# Patient Record
Sex: Female | Born: 1940 | Race: White | Hispanic: No | Smoking: Current every day smoker
Health system: Southern US, Community
[De-identification: ages and names within clinical notes are randomized; demographics above are authoritative.]

## PROBLEM LIST (undated history)

## (undated) DIAGNOSIS — G8929 Other chronic pain: Secondary | ICD-10-CM

## (undated) DIAGNOSIS — I519 Heart disease, unspecified: Secondary | ICD-10-CM

## (undated) DIAGNOSIS — E785 Hyperlipidemia, unspecified: Secondary | ICD-10-CM

## (undated) DIAGNOSIS — F21 Schizotypal disorder: Secondary | ICD-10-CM

## (undated) DIAGNOSIS — Z9119 Patient's noncompliance with other medical treatment and regimen: Secondary | ICD-10-CM

## (undated) DIAGNOSIS — F329 Major depressive disorder, single episode, unspecified: Secondary | ICD-10-CM

## (undated) DIAGNOSIS — F0390 Unspecified dementia without behavioral disturbance: Secondary | ICD-10-CM

## (undated) DIAGNOSIS — I219 Acute myocardial infarction, unspecified: Secondary | ICD-10-CM

## (undated) DIAGNOSIS — E079 Disorder of thyroid, unspecified: Secondary | ICD-10-CM

## (undated) DIAGNOSIS — I509 Heart failure, unspecified: Secondary | ICD-10-CM

## (undated) DIAGNOSIS — F419 Anxiety disorder, unspecified: Secondary | ICD-10-CM

## (undated) DIAGNOSIS — I2510 Atherosclerotic heart disease of native coronary artery without angina pectoris: Secondary | ICD-10-CM

## (undated) DIAGNOSIS — I10 Essential (primary) hypertension: Secondary | ICD-10-CM

## (undated) DIAGNOSIS — I739 Peripheral vascular disease, unspecified: Secondary | ICD-10-CM

## (undated) DIAGNOSIS — J449 Chronic obstructive pulmonary disease, unspecified: Secondary | ICD-10-CM

## (undated) DIAGNOSIS — Z720 Tobacco use: Secondary | ICD-10-CM

## (undated) DIAGNOSIS — Z91199 Patient's noncompliance with other medical treatment and regimen due to unspecified reason: Secondary | ICD-10-CM

## (undated) HISTORY — PX: CHOLECYSTECTOMY: SHX55

## (undated) HISTORY — PX: THYROIDECTOMY: SHX17

---

## 2001-05-16 ENCOUNTER — Encounter (HOSPITAL_COMMUNITY): Admission: RE | Admit: 2001-05-16 | Discharge: 2001-06-15 | Payer: Self-pay | Admitting: Internal Medicine

## 2001-11-02 HISTORY — PX: CORONARY ANGIOPLASTY WITH STENT PLACEMENT: SHX49

## 2002-03-26 ENCOUNTER — Encounter: Payer: Self-pay | Admitting: Emergency Medicine

## 2002-03-26 ENCOUNTER — Inpatient Hospital Stay (HOSPITAL_COMMUNITY): Admission: AD | Admit: 2002-03-26 | Discharge: 2002-04-01 | Payer: Self-pay | Admitting: *Deleted

## 2002-05-23 ENCOUNTER — Encounter (HOSPITAL_COMMUNITY): Admission: RE | Admit: 2002-05-23 | Discharge: 2002-06-02 | Payer: Self-pay | Admitting: *Deleted

## 2005-07-21 ENCOUNTER — Observation Stay (HOSPITAL_COMMUNITY): Admission: EM | Admit: 2005-07-21 | Discharge: 2005-07-22 | Payer: Self-pay | Admitting: Emergency Medicine

## 2005-07-28 ENCOUNTER — Inpatient Hospital Stay (HOSPITAL_COMMUNITY): Admission: EM | Admit: 2005-07-28 | Discharge: 2005-08-06 | Payer: Self-pay | Admitting: Emergency Medicine

## 2005-08-11 ENCOUNTER — Inpatient Hospital Stay (HOSPITAL_COMMUNITY): Admission: AD | Admit: 2005-08-11 | Discharge: 2005-08-13 | Payer: Self-pay | Admitting: Family Medicine

## 2005-09-29 ENCOUNTER — Encounter (HOSPITAL_COMMUNITY): Admission: RE | Admit: 2005-09-29 | Discharge: 2005-10-29 | Payer: Self-pay | Admitting: *Deleted

## 2013-06-16 ENCOUNTER — Other Ambulatory Visit: Payer: Self-pay | Admitting: *Deleted

## 2013-06-16 MED ORDER — METOPROLOL TARTRATE 25 MG PO TABS: 25.0000 mg | ORAL_TABLET | Freq: Two times a day (BID) | ORAL | Status: DC

## 2013-08-30 ENCOUNTER — Ambulatory Visit (INDEPENDENT_AMBULATORY_CARE_PROVIDER_SITE_OTHER): Payer: Medicare Other | Admitting: Cardiovascular Disease

## 2013-08-30 ENCOUNTER — Encounter: Payer: Self-pay | Admitting: Cardiovascular Disease

## 2013-08-30 VITALS — BP 127/79 | HR 66 | Ht 66.0 in | Wt 107.0 lb

## 2013-08-30 DIAGNOSIS — I2510 Atherosclerotic heart disease of native coronary artery without angina pectoris: Secondary | ICD-10-CM | POA: Insufficient documentation

## 2013-08-30 DIAGNOSIS — E785 Hyperlipidemia, unspecified: Secondary | ICD-10-CM

## 2013-08-30 DIAGNOSIS — F17200 Nicotine dependence, unspecified, uncomplicated: Secondary | ICD-10-CM

## 2013-08-30 DIAGNOSIS — Z720 Tobacco use: Secondary | ICD-10-CM

## 2013-08-30 DIAGNOSIS — I10 Essential (primary) hypertension: Secondary | ICD-10-CM

## 2013-08-30 MED ORDER — SPIRONOLACTONE 25 MG PO TABS: 12.5000 mg | ORAL_TABLET | Freq: Every day | ORAL | Status: DC

## 2013-08-30 MED ORDER — SIMVASTATIN 80 MG PO TABS: 80.0000 mg | ORAL_TABLET | Freq: Every day | ORAL | Status: DC

## 2013-08-30 MED ORDER — METOPROLOL TARTRATE 25 MG PO TABS: 25.0000 mg | ORAL_TABLET | Freq: Two times a day (BID) | ORAL | Status: DC

## 2013-08-30 NOTE — Patient Instructions (Addendum)
Please follow up with Dr.Koneswaran in one year. You will receive a reminder letter in the mail in about 10 months reminding you to call and schedule your appointment. If you don't receive this letter, please contact our office.  Your physician recommends that you continue on your current medications as directed. Please refer to the Current Medication list given to you today.

## 2013-08-30 NOTE — Progress Notes (Signed)
Patient ID: Ruth Davidson, female   DOB: 02-03-1941, 72 y.o.   MRN: [id number]       CARDIOLOGY CONSULT NOTE  Patient ID: Ruth Davidson MRN: [id number] DOB/AGE: 03-Sep-1941 72 y.o.  Admit date: (Not on file) Primary Physician Kirk Ruths, MD  Reason for Consultation: CAD with stent  HPI: Ruth Davidson is a very pleasant 72 yr old woman who is a former pt of [facility]. She has known CAD with an inferior wall MI in 2003 for which she received a bare metal stent, and then had an anterior wall MI due to distal LAD disease which was treated medically due to the diffuse nature of her disease. She has not experienced any chest pain since 2006.  The patient denies any symptoms of chest pain, palpitations, shortness of breath, lightheadedness, dizziness, leg swelling, orthopnea, PND, and syncope.  At one point she had severely depressed LV systolic function, but her most recent evaluation reportedly revealed an EF of 50-55%.  SocHx: smokes 3 cigarettes daily. 3 children. Married.     No Known Allergies  Current Outpatient Prescriptions  Medication Sig Dispense Refill  . aspirin EC 81 MG tablet Take 81 mg by mouth daily.      . fish oil-omega-3 fatty acids 1000 MG capsule Take 1 g by mouth daily.      . metoprolol tartrate (LOPRESSOR) 25 MG tablet Take 1 tablet (25 mg total) by mouth 2 (two) times daily.  60 tablet  1  . simvastatin (ZOCOR) 80 MG tablet Take 80 mg by mouth at bedtime.      Marland Kitchen spironolactone (ALDACTONE) 25 MG tablet Take 12.5 mg by mouth daily.       No current facility-administered medications for this visit.    No past medical history on file.  No past surgical history on file.  History   Social History  . Marital Status: Married    Spouse Name: N/A    Number of Children: N/A  . Years of Education: N/A   Occupational History  . Not on file.   Social History Main Topics  . Smoking status: Current Every Day Smoker -- 0.25 packs/day for 50 years    Types:  Cigarettes  . Smokeless tobacco: Never Used  . Alcohol Use: Not on file  . Drug Use: Not on file  . Sexual Activity: Not on file   Other Topics Concern  . Not on file   Social History Narrative  . No narrative on file     No family history on file.   Prior to Admission medications   Medication Sig Start Date End Date Taking? Authorizing Provider  aspirin EC 81 MG tablet Take 81 mg by mouth daily.   Yes Historical Provider, MD  fish oil-omega-3 fatty acids 1000 MG capsule Take 1 g by mouth daily.   Yes Historical Provider, MD  metoprolol tartrate (LOPRESSOR) 25 MG tablet Take 1 tablet (25 mg total) by mouth 2 (two) times daily. 06/16/13  Yes Mihai Croitoru, MD  simvastatin (ZOCOR) 80 MG tablet Take 80 mg by mouth at bedtime.   Yes Historical Provider, MD  spironolactone (ALDACTONE) 25 MG tablet Take 12.5 mg by mouth daily.   Yes Historical Provider, MD     Review of systems complete and found to be negative unless listed above in HPI     Physical exam Blood pressure 127/79, pulse 66, height 5\' 6"  (1.676 m), weight 107 lb (48.535 kg). General: NAD Neck: No JVD, no thyromegaly  or thyroid nodule.  Lungs: Clear to auscultation bilaterally with normal respiratory effort. CV: Nondisplaced PMI.  Heart regular S1/S2, no S3/S4, no murmur.  No peripheral edema.  No carotid bruit.  Normal pedal pulses.  Abdomen: Soft, nontender, no hepatosplenomegaly, no distention.  Skin: Intact without lesions or rashes.  Neurologic: Alert and oriented x 3.  Psych: Normal affect. Extremities: No clubbing or cyanosis.  HEENT: Normal.   Labs:   No results found for this basename: WBC, HGB, HCT, MCV, PLT   No results found for this basename: NA, K, CL, CO2, BUN, CREATININE, CALCIUM, LABALBU, PROT, BILITOT, ALKPHOS, ALT, AST, GLUCOSE,  in the last 168 hours No results found for this basename: CKTOTAL, CKMB, CKMBINDEX, TROPONINI    No results found for this basename: CHOL   No results found for  this basename: HDL   No results found for this basename: LDLCALC   No results found for this basename: TRIG   No results found for this basename: CHOLHDL   No results found for this basename: LDLDIRECT       EKG: Sinus rhythm, rate 64 bpm, nonspecific T wave abnormality  Studies: See HPI  ASSESSMENT AND PLAN: 1. CAD: symptomatically stable for several years. Continue current therapy which includes ASA 81 mg daily, metoprolol, and simvastatin. 2. HTN: currently on spironolactone, and controlled. 3. Hyperlipidemia: on simvastatin 80 mg daily.  Dispo: f/u 1 year.  Signed: Prentice Docker, M.D., F.A.C.C.  08/30/2013, 1:46 PM

## 2013-09-01 ENCOUNTER — Encounter: Payer: Self-pay | Admitting: Cardiovascular Disease

## 2013-10-05 ENCOUNTER — Ambulatory Visit (INDEPENDENT_AMBULATORY_CARE_PROVIDER_SITE_OTHER): Payer: Medicare Other

## 2013-10-05 VITALS — BP 140/75 | HR 62 | Temp 97.9°F | Resp 18

## 2013-10-05 DIAGNOSIS — Z23 Encounter for immunization: Secondary | ICD-10-CM

## 2014-09-03 ENCOUNTER — Encounter: Payer: Self-pay | Admitting: Cardiovascular Disease

## 2014-09-03 ENCOUNTER — Ambulatory Visit (INDEPENDENT_AMBULATORY_CARE_PROVIDER_SITE_OTHER): Payer: Medicare Other | Admitting: Cardiovascular Disease

## 2014-09-03 VITALS — BP 146/90 | HR 52 | Ht 66.0 in | Wt 88.0 lb

## 2014-09-03 DIAGNOSIS — I10 Essential (primary) hypertension: Secondary | ICD-10-CM

## 2014-09-03 DIAGNOSIS — I2510 Atherosclerotic heart disease of native coronary artery without angina pectoris: Secondary | ICD-10-CM

## 2014-09-03 DIAGNOSIS — E785 Hyperlipidemia, unspecified: Secondary | ICD-10-CM

## 2014-09-03 DIAGNOSIS — Z716 Tobacco abuse counseling: Secondary | ICD-10-CM

## 2014-09-03 NOTE — Addendum Note (Signed)
Addended by: Marlyn CorporalARLTON, CATHERINE A on: 09/03/2014 03:23 PM   Modules accepted: Orders

## 2014-09-03 NOTE — Patient Instructions (Signed)
Your physician wants you to follow-up in: 6 months You will receive a reminder letter in the mail two months in advance. If you don't receive a letter, please call our office to schedule the follow-up appointment.     Your physician recommends that you continue on your current medications as directed. Please refer to the Current Medication list given to you today.      Please keep daily BP log and drop off in 1 month for us to review      Thank you for choosing  [facility] !

## 2014-09-03 NOTE — Progress Notes (Signed)
Patient ID: Ruth Davidson, female   DOB: Jul 21, 1941, 73 y.o.   MRN: [id number]      SUBJECTIVE: The patient presents for follow-up regarding coronary artery disease, hypertension, and hyperlipidemia. She had an inferior wall MI in 2003 for which she received a bare metal stent, and then had an anterior wall MI due to distal LAD disease which was treated medically due to the diffuse nature of her disease. The patient denies any symptoms of chest pain, palpitations, shortness of breath, lightheadedness, dizziness, leg swelling, orthopnea, PND, and syncope. Her husband of 27 years passed away four weeks ago. She has not been eating much. She smokes 6 cigarettes daily. ECG performed in the office today demonstrates normal sinus rhythm with a nonspecific T wave abnormality.   Review of Systems: As per "subjective", otherwise negative.  No Known Allergies  Current Outpatient Prescriptions  Medication Sig Dispense Refill  . aspirin EC 81 MG tablet Take 81 mg by mouth daily.    . fish oil-omega-3 fatty acids 1000 MG capsule Take 1 g by mouth daily.    . metoprolol tartrate (LOPRESSOR) 25 MG tablet Take 1 tablet (25 mg total) by mouth 2 (two) times daily. 60 tablet 11  . simvastatin (ZOCOR) 80 MG tablet Take 1 tablet (80 mg total) by mouth at bedtime. 30 tablet 11  . spironolactone (ALDACTONE) 25 MG tablet Take 0.5 tablets (12.5 mg total) by mouth daily. 15 tablet 11   No current facility-administered medications for this visit.    No past medical history on file.  No past surgical history on file.  History   Social History  . Marital Status: Married    Spouse Name: N/A    Number of Children: N/A  . Years of Education: N/A   Occupational History  . Not on file.   Social History Main Topics  . Smoking status: Current Every Day Smoker -- 0.25 packs/day for 50 years    Types: Cigarettes    Start date: 11/02/1958  . Smokeless tobacco: Never Used  . Alcohol Use: Not on file  . Drug  Use: Not on file  . Sexual Activity: Not on file   Other Topics Concern  . Not on file   Social History Narrative     Filed Vitals:   09/03/14 1341  BP: 146/90  Pulse: 52  Height: 5\' 6"  (1.676 m)  Weight: 88 lb (39.917 kg)    PHYSICAL EXAM General: NAD HEENT: Normal. Neck: No JVD, no thyromegaly. Lungs: Clear to auscultation bilaterally with normal respiratory effort. CV: Nondisplaced PMI.  Regular rate and rhythm, normal S1/S2, no S3/S4, no murmur. No pretibial or periankle edema.  No carotid bruit.  Normal pedal pulses.  Abdomen: Soft, nontender, no hepatosplenomegaly, no distention.  Neurologic: Alert and oriented x 3.  Psych: Normal affect. Skin: Normal. Musculoskeletal: Normal range of motion, no gross deformities. Extremities: No clubbing or cyanosis.   ECG: Most recent ECG reviewed.      ASSESSMENT AND PLAN: 1. CAD: Symptomatically stable for several years. Continue current therapy which includes ASA 81 mg daily, metoprolol, and simvastatin. 2. Essential HTN: Elevated today. Currently on spironolactone. I have asked the patient to check blood pressure readings 4-5 times per week, at different times throughout the day, in order to get a better approximation of mean BP values. These results will be provided to me at the end of that period so that I can determine if antihypertensive medication titration is indicated. 3. Hyperlipidemia: Check lipids/LFT's if  not done by PCP this year. On simvastatin 80 mg daily. 4. Tobacco abuse: Cessation counseling provided.  Dispo: f/u 6 months.   Prentice DockerSuresh Koneswaran, M.D., F.A.C.C.

## 2014-10-08 ENCOUNTER — Other Ambulatory Visit: Payer: Self-pay | Admitting: Cardiovascular Disease

## 2014-10-08 MED ORDER — SPIRONOLACTONE 25 MG PO TABS: 12.5000 mg | ORAL_TABLET | Freq: Every day | ORAL | Status: DC

## 2014-10-08 NOTE — Telephone Encounter (Signed)
Received fax refill request  Rx # [id number] Medication:  Simvastatin 80 mg tab Qty 30 Sig:  Take one tablet by mouth at bedtime Physician:  Purvis SheffieldKoneswaran

## 2014-10-08 NOTE — Telephone Encounter (Signed)
Received fax refill request  Rx # [id number] Medication:  Spironolactone 25 mg tab Qty 15 Sig:  Take one-half tablet by mouth once daily Physician:  Purvis SheffieldKoneswaran

## 2014-10-08 NOTE — Telephone Encounter (Signed)
Refill complete 

## 2014-10-11 ENCOUNTER — Other Ambulatory Visit: Payer: Self-pay | Admitting: Cardiovascular Disease

## 2014-10-11 MED ORDER — SIMVASTATIN 80 MG PO TABS: 80.0000 mg | ORAL_TABLET | Freq: Every day | ORAL | Status: DC

## 2014-10-11 NOTE — Telephone Encounter (Signed)
Received fax refill request ° °Rx # [id number] °Medication:  Simvastatin 80 mg tab °Qty 30 °Sig:  Take one tablet by mouth at bedtime °Physician:  Koneswaran  ° ° °

## 2014-10-12 ENCOUNTER — Telehealth: Payer: Self-pay | Admitting: *Deleted

## 2014-10-12 MED ORDER — METOPROLOL TARTRATE 25 MG PO TABS: 25.0000 mg | ORAL_TABLET | Freq: Two times a day (BID) | ORAL | Status: DC

## 2014-10-12 NOTE — Telephone Encounter (Signed)
METOPROLOL TART 25 MG #60 REQUEST BY FAX

## 2014-10-12 NOTE — Telephone Encounter (Signed)
Medication sent to pharmacy  

## 2015-11-09 ENCOUNTER — Encounter (HOSPITAL_COMMUNITY): Payer: Self-pay | Admitting: *Deleted

## 2015-11-09 ENCOUNTER — Emergency Department (HOSPITAL_COMMUNITY)
Admission: EM | Admit: 2015-11-09 | Discharge: 2015-11-10 | Discharge status: Against Medical Advice | Payer: Medicare Other | Attending: Emergency Medicine | Admitting: Emergency Medicine

## 2015-11-09 DIAGNOSIS — Z5329 Procedure and treatment not carried out because of patient's decision for other reasons: Secondary | ICD-10-CM

## 2015-11-09 DIAGNOSIS — Z79899 Other long term (current) drug therapy: Secondary | ICD-10-CM | POA: Insufficient documentation

## 2015-11-09 DIAGNOSIS — I214 Non-ST elevation (NSTEMI) myocardial infarction: Secondary | ICD-10-CM | POA: Insufficient documentation

## 2015-11-09 DIAGNOSIS — Z7982 Long term (current) use of aspirin: Secondary | ICD-10-CM | POA: Insufficient documentation

## 2015-11-09 DIAGNOSIS — F17210 Nicotine dependence, cigarettes, uncomplicated: Secondary | ICD-10-CM | POA: Diagnosis not present

## 2015-11-09 DIAGNOSIS — I10 Essential (primary) hypertension: Secondary | ICD-10-CM | POA: Insufficient documentation

## 2015-11-09 DIAGNOSIS — Z5320 Procedure and treatment not carried out because of patient's decision for unspecified reasons: Secondary | ICD-10-CM

## 2015-11-09 DIAGNOSIS — Z5321 Procedure and treatment not carried out due to patient leaving prior to being seen by health care provider: Secondary | ICD-10-CM | POA: Diagnosis not present

## 2015-11-09 DIAGNOSIS — R079 Chest pain, unspecified: Secondary | ICD-10-CM | POA: Diagnosis present

## 2015-11-09 DIAGNOSIS — I2510 Atherosclerotic heart disease of native coronary artery without angina pectoris: Secondary | ICD-10-CM | POA: Diagnosis not present

## 2015-11-09 HISTORY — DX: Acute myocardial infarction, unspecified: I21.9

## 2015-11-09 NOTE — ED Provider Notes (Signed)
CSN: [id number]     Arrival date & time 11/09/15  2348 History   First MD Initiated Contact with Patient 11/09/15 2348     Chief Complaint  Patient presents with  . Chest Pain    onset was 1.5 hrs ago. was watching tv and it started     (Consider location/radiation/quality/duration/timing/severity/associated sxs/prior Treatment) The history is provided by the patient.   75 year old female with history of coronary artery disease had onset at about 11 PM of pain in both arms which radiated across her mid and upper back. Pain is mild to moderate and she rates at 4/10. Nothing made it better nothing made it worse. She denies dyspnea, nausea, diaphoresis. She describes the pain as dull. She has not done anything to try to treat it. She cannot remember if the pain is similar to what she had with her heart attack.  No past medical history on file. No past surgical history on file. No family history on file. Social History  Substance Use Topics  . Smoking status: Current Every Day Smoker -- 0.25 packs/day for 50 years    Types: Cigarettes    Start date: 11/02/1958  . Smokeless tobacco: Never Used  . Alcohol Use: Not on file   OB History    No data available     Review of Systems  All other systems reviewed and are negative.     Allergies  Review of patient's allergies indicates no known allergies.  Home Medications   Prior to Admission medications   Medication Sig Start Date End Date Taking? Authorizing Provider  aspirin EC 81 MG tablet Take 81 mg by mouth daily.    Historical Provider, MD  fish oil-omega-3 fatty acids 1000 MG capsule Take 1 g by mouth daily.    Historical Provider, MD  metoprolol tartrate (LOPRESSOR) 25 MG tablet Take 1 tablet (25 mg total) by mouth 2 (two) times daily. 10/12/14   Laqueta Linden, MD  simvastatin (ZOCOR) 80 MG tablet Take 1 tablet (80 mg total) by mouth at bedtime. 10/11/14   Laqueta Linden, MD  spironolactone (ALDACTONE) 25 MG tablet  Take 0.5 tablets (12.5 mg total) by mouth daily. 10/08/14   Laqueta Linden, MD   BP 144/82 mmHg  Pulse 88  Temp(Src) 98.2 F (36.8 C) (Oral)  Resp 24  Ht 5\' 5"  (1.651 m)  Wt 101 lb (45.813 kg)  BMI 16.81 kg/m2 Physical Exam  Nursing note and vitals reviewed.  75 year old female, resting comfortably and in no acute distress. Vital signs are significant for mild hypertension and also for tachypnea. Oxygen saturation is 98%, which is normal. Head is normocephalic and atraumatic. PERRLA, EOMI. Oropharynx is clear. Neck is nontender and supple without adenopathy or JVD. Back is nontender and there is no CVA tenderness. Lungs are clear without rales, wheezes, or rhonchi. Chest is nontender. Heart has regular rate and rhythm without murmur. Pulses are symmetric. Abdomen is soft, flat, nontender without masses or hepatosplenomegaly and peristalsis is normoactive. Extremities have no cyanosis or edema, full range of motion is present. Skin is warm and dry without rash. Neurologic: Mental status is normal, cranial nerves are intact, there are no motor or sensory deficits.  ED Course  Procedures (including critical care time) Labs Review Results for orders placed or performed during the hospital encounter of 11/09/15  Basic metabolic panel  Result Value Ref Range   Sodium 141 135 - 145 mmol/L   Potassium 3.7 3.5 - 5.1 mmol/L  Chloride 102 101 - 111 mmol/L   CO2 29 22 - 32 mmol/L   Glucose, Bld 98 65 - 99 mg/dL   BUN 18 6 - 20 mg/dL   Creatinine, Ser 1.61 (H) 0.44 - 1.00 mg/dL   Calcium 9.5 8.9 - 09.6 mg/dL   GFR calc non Af Amer 51 (L) >60 mL/min   GFR calc Af Amer 59 (L) >60 mL/min   Anion gap 10 5 - 15  CBC with Differential  Result Value Ref Range   WBC 6.9 4.0 - 10.5 K/uL   RBC 4.84 3.87 - 5.11 MIL/uL   Hemoglobin 16.2 (H) 12.0 - 15.0 g/dL   HCT 04.5 (H) 40.9 - 81.1 %   MCV 98.6 78.0 - 100.0 fL   MCH 33.5 26.0 - 34.0 pg   MCHC 34.0 30.0 - 36.0 g/dL   RDW 91.4 78.2 -  95.6 %   Platelets 173 150 - 400 K/uL   Neutrophils Relative % 49 %   Neutro Abs 3.3 1.7 - 7.7 K/uL   Lymphocytes Relative 40 %   Lymphs Abs 2.8 0.7 - 4.0 K/uL   Monocytes Relative 9 %   Monocytes Absolute 0.6 0.1 - 1.0 K/uL   Eosinophils Relative 2 %   Eosinophils Absolute 0.1 0.0 - 0.7 K/uL   Basophils Relative 0 %   Basophils Absolute 0.0 0.0 - 0.1 K/uL  Troponin I  Result Value Ref Range   Troponin I 0.69 (HH) <0.031 ng/mL  APTT  Result Value Ref Range   aPTT 27 24 - 37 seconds  Protime-INR  Result Value Ref Range   Prothrombin Time 12.4 11.6 - 15.2 seconds   INR 0.90 0.00 - 1.49  I-stat troponin, ED  Result Value Ref Range   Troponin i, poc 0.63 (HH) 0.00 - 0.08 ng/mL   Comment 3           Imaging Review Dg Chest Port 1 View  11/10/2015  CLINICAL DATA:  Acute onset of generalized chest pain, radiating to both arms. Initial encounter. EXAM: PORTABLE CHEST 1 VIEW COMPARISON:  Chest radiograph from 08/11/2005 FINDINGS: The lungs are hyperexpanded, with flattening of the hemidiaphragms, compatible with COPD. Mild vascular congestion is noted, with mild bibasilar atelectasis. There is no evidence of pleural effusion or pneumothorax. The cardiomediastinal silhouette is within normal limits. No acute osseous abnormalities are seen. IMPRESSION: Mild vascular congestion, with mild bibasilar atelectasis. Underlying findings of COPD. Electronically Signed   By: Roanna Raider M.D.   On: 11/10/2015 00:29   I have personally reviewed and evaluated these images and lab results as part of my medical decision-making.   EKG Interpretation   Date/Time:  Saturday November 09 2015 23:58:52 EST Ventricular Rate:  84 PR Interval:  147 QRS Duration: 94 QT Interval:  392 QTC Calculation: 463 R Axis:   59 Text Interpretation:  Sinus rhythm Biatrial enlargement Probable left  ventricular hypertrophy Nonspecific T abnrm, anterolateral leads When  compared with ECG of 08/01/2005, No significant  change was found Confirmed  by [facility]  MD, Freeda Spivey ([id number]) on 11/10/2015 12:06:20 AM      CRITICAL CARE Performed by: MVHQI,ONGEX Total critical care time: 50 minutes Critical care time was exclusive of separately billable procedures and treating other patients. Critical care was necessary to treat or prevent imminent or life-threatening deterioration. Critical care was time spent personally by me on the following activities: development of treatment plan with patient and/or surrogate as well as nursing, discussions with consultants, evaluation of  patient's response to treatment, examination of patient, obtaining history from patient or surrogate, ordering and performing treatments and interventions, ordering and review of laboratory studies, ordering and review of radiographic studies, pulse oximetry and re-evaluation of patient's condition. MDM   Final diagnoses:  Non-STEMI (non-ST elevated myocardial infarction) (HCC)  Left against medical advice    Arm and back pain of uncertain cause. Old records are reviewed and she had a heart attack in 2006. Catheterization at that time showed small vessel disease involving the LAD which was not amenable to stenting or bypass. She apparently had spin pain-free since then. Current pain is consistent with cardiac pain but could also be musculoskeletal or radicular. ECG shows no acute changes. Blood pressure was checked in both arms and was symmetric making aortic dissection somewhat unlikely. She'll be given therapeutic trial of nitroglycerin. I anticipate she will need serial troponins.  She had complete relief of pain with sublingual nitroglycerin. Troponin has come back elevated and I started to make arrangements to admit her. However, when I explained the findings to her, she stated that [facility] I offered [facility] the [facility] but she stated she did not want to  be admitted at any hospital and that she wished to go home. I did explain to her that she was having a heart attack and there was a serious risk of death if she left the emergency department and went home. She stated she understood this and still wishes to go home. Family member was present during this discussion. I did leave her to allow family member to try to persuade her but she insisted on leaving AGAINST MEDICAL ADVICE. She clearly understood the risk of death. I am giving her a prescription for nitroglycerin sublingual tablets and have recommended that she see her cardiologist as soon as possible. Also, advised to return to the emergency department at any time if she changes her mind about being admitted.  Dione Boozeavid Geniece Akers, MD 11/10/15 [phone]

## 2015-11-10 ENCOUNTER — Emergency Department (HOSPITAL_COMMUNITY): Payer: Medicare Other

## 2015-11-10 LAB — BASIC METABOLIC PANEL WITH GFR
Anion gap: 10 (ref 5–15)
BUN: 18 mg/dL (ref 6–20)
CO2: 29 mmol/L (ref 22–32)
Calcium: 9.5 mg/dL (ref 8.9–10.3)
Chloride: 102 mmol/L (ref 101–111)
Creatinine, Ser: 1.06 mg/dL — ABNORMAL HIGH (ref 0.44–1.00)
GFR calc Af Amer: 59 mL/min — ABNORMAL LOW (ref >60)
GFR calc non Af Amer: 51 mL/min — ABNORMAL LOW (ref >60)
Glucose, Bld: 98 mg/dL (ref 65–99)
Potassium: 3.7 mmol/L (ref 3.5–5.1)
Sodium: 141 mmol/L (ref 135–145)

## 2015-11-10 LAB — CBC WITH DIFFERENTIAL/PLATELET
Basophils Absolute: 0 K/uL (ref 0.0–0.1)
Basophils Relative: 0 %
Eosinophils Absolute: 0.1 K/uL (ref 0.0–0.7)
Eosinophils Relative: 2 %
HCT: 47.7 % — ABNORMAL HIGH (ref 36.0–46.0)
Hemoglobin: 16.2 g/dL — ABNORMAL HIGH (ref 12.0–15.0)
Lymphocytes Relative: 40 %
Lymphs Abs: 2.8 K/uL (ref 0.7–4.0)
MCH: 33.5 pg (ref 26.0–34.0)
MCHC: 34 g/dL (ref 30.0–36.0)
MCV: 98.6 fL (ref 78.0–100.0)
Monocytes Absolute: 0.6 K/uL (ref 0.1–1.0)
Monocytes Relative: 9 %
Neutro Abs: 3.3 K/uL (ref 1.7–7.7)
Neutrophils Relative %: 49 %
Platelets: 173 K/uL (ref 150–400)
RBC: 4.84 MIL/uL (ref 3.87–5.11)
RDW: 13.5 % (ref 11.5–15.5)
WBC: 6.9 K/uL (ref 4.0–10.5)

## 2015-11-10 LAB — I-STAT TROPONIN, ED: Troponin i, poc: 0.63 ng/mL (ref 0.00–0.08)

## 2015-11-10 LAB — PROTIME-INR
INR: 0.9 (ref 0.00–1.49)
Prothrombin Time: 12.4 s (ref 11.6–15.2)

## 2015-11-10 LAB — TROPONIN I: Troponin I: 0.69 ng/mL (ref <0.031)

## 2015-11-10 LAB — APTT: APTT: 27 s (ref 24–37)

## 2015-11-10 MED ORDER — ASPIRIN 81 MG PO CHEW
324.0000 mg | CHEWABLE_TABLET | Freq: Once | ORAL | Status: AC
  Administered 2015-11-10: 324 mg via ORAL
  Filled 2015-11-10: qty 4

## 2015-11-10 MED ORDER — NITROGLYCERIN 0.4 MG SL SUBL
0.4000 mg | SUBLINGUAL_TABLET | SUBLINGUAL | Status: DC | PRN
  Administered 2015-11-10: 0.4 mg via SUBLINGUAL
  Filled 2015-11-10: qty 1

## 2015-11-10 MED ORDER — HEPARIN BOLUS VIA INFUSION: 2500.0000 [IU] | Freq: Once | INTRAVENOUS | Status: DC

## 2015-11-10 MED ORDER — HEPARIN (PORCINE) IN NACL 100-0.45 UNIT/ML-% IJ SOLN: 550.0000 [IU]/h | INTRAMUSCULAR | Status: DC

## 2015-11-10 MED ORDER — NITROGLYCERIN 0.4 MG SL SUBL: 0.4000 mg | SUBLINGUAL_TABLET | SUBLINGUAL | Status: AC | PRN

## 2015-11-10 NOTE — Discharge Instructions (Signed)
Your blood test shows that you are having a heart attack. Accepted medical treatment is to admitted to the hospital. You are refusing hospital admission. Please realize that this brings a serious risk of death from your heart attack. If you change your mind at any time, please return to the emergency department immediately so that we can resume appropriate treatment. Please make sure to continue taking here daily aspirin.   Acute Coronary Syndrome Acute coronary syndrome (ACS) is a serious problem in which there is suddenly not enough blood and oxygen supplied to the heart. ACS may mean that one or more of the blood vessels in your heart (coronary arteries) may be blocked. ACS can result in chest pain or a heart attack (myocardial infarction or MI). CAUSES This condition is caused by atherosclerosis, which is the buildup of fat and cholesterol (plaque) on the inside of the arteries. Over time, the plaque may narrow or block the artery, and this will lessen blood flow to the heart. Plaque can also become weak and break off within a coronary artery to form a clot and cause a sudden blockage. RISK FACTORS The risks factors of this condition include:  High cholesterol levels.  High blood pressure (hypertension).  Smoking.  Diabetes.  Age.  Family history of chest pain, heart disease, or stroke.  Lack of exercise. SYMPTOMS The most common signs of this condition include:  Chest pain, which can be:  A crushing or squeezing in the chest.  A tightness, pressure, fullness, or heaviness in the chest.  Present for more than a few minutes, or it can stop and recur.  Pain in the arms, neck, jaw, or back.  Unexplained heartburn or indigestion.  Shortness of breath.  Nausea.  Sudden cold sweats.  Feeling light-headed or dizzy. Sometimes, this condition has no symptoms. DIAGNOSIS ACS may be diagnosed through the following tests:  Electrocardiogram (ECG).  Blood tests.  Coronary  angiogram. This is a procedure to look at the coronary arteries to see if there is any blockage. TREATMENT Treatment for ACS may include:  Healthy behavioral changes to reduce or control risk factors.  Medicine.  Coronary stenting.A stent helps to keep an artery open.  Coronary angioplasty. This procedure widens a narrowed or blocked artery.  Coronary artery bypass surgery. This will allow your blood to pass the blockage (bypass) to reach your heart. HOME CARE INSTRUCTIONS Eating and Drinking  Follow a heart-healthy diet. A dietitian can you help to educate you about healthy food options and changes.  Use healthy cooking methods such as roasting, grilling, broiling, baking, poaching, steaming, or stir-frying. Talk to a dietitian to learn more about healthy cooking methods. Medicines  Take medicines only as directed by your health care provider.  Do not take the following medicines unless your health care provider approves:  Nonsteroidal anti-inflammatory drugs (NSAIDs), such as ibuprofen, naproxen, or celecoxib.  Vitamin supplements that contain vitamin A, vitamin E, or both.  Hormone replacement therapy that contains estrogen with or without progestin.  Stop illegal drug use. Activities  Follow an exercise program that is approved by your health care provider.  Plan rest periods when you are fatigued. Lifestyle  Do not use any tobacco products, including cigarettes, chewing tobacco, or electronic cigarettes. If you need help quitting, ask your health care provider.  If you drink alcohol, and your health care provider approves, limit your alcohol intake to no more than 1 drink per day. One drink equals 12 ounces of beer, 5 ounces of  wine, or 1 ounces of hard liquor.  Learn to manage stress.  Maintain a healthy weight. Lose weight as approved by your health care provider. General Instructions  Manage other health conditions, such as hypertension and diabetes, as  directed by your health care provider.  Keep all follow-up visits as directed by your health care provider. This is important.  Your health care provider may ask you to monitor your blood pressure. A blood pressure reading consists of a higher number over a lower number, such as 110 over 72, written as 110/72. Ideally, your blood pressure should be:  Below 140/90 if you have no other medical conditions.  Below 130/80 if you have diabetes or kidney disease. SEEK IMMEDIATE MEDICAL CARE IF:  You have pain in your chest, neck, arm, jaw, stomach, or back that lasts more than a few minutes, is recurring, or is not relieved by taking medicine under your tongue (sublingual nitroglycerin).  You have profuse sweating without cause.  You have unexplained:  Heartburn or indigestion.  Shortness of breath or difficulty breathing.  Nausea or vomiting.  Fatigue.  Feelings of nervousness or anxiety.  Weakness.  Diarrhea.  You have sudden light-headedness or dizziness.  You faint. These symptoms may represent a serious problem that is an emergency. Do not wait to see if the symptoms will go away. Get medical help right away. Call your local emergency services (911 in the [place] Do not drive yourself to the clinic or hospital.   This information is not intended to replace advice given to you by your health care provider. Make sure you discuss any questions you have with your health care provider.   Document Released: 10/19/2005 Document Revised: 11/09/2014 Document Reviewed: 02/20/2014 Elsevier Interactive Patient Education 2016 Else[employer]  Nitroglycerin sublingual tablets What is this medicine? NITROGLYCERIN (nye troe GLI ser in) is a type of vasodilator. It relaxes blood vessels, increasing the blood and oxygen supply to your heart. This medicine is used to relieve chest pain caused by angina. It is also used to prevent chest pain before activities like climbing stairs, going outdoors  in cold weather, or sexual activity. This medicine may be used for other purposes; ask your health care provider or pharmacist if you have questions. What should I tell my health care provider before I take this medicine? They need to know if you have any of these conditions: -anemia -head injury, recent stroke, or bleeding in the brain -liver disease -previous heart attack -an unusual or allergic reaction to nitroglycerin, other medicines, foods, dyes, or preservatives -pregnant or trying to get pregnant -breast-feeding How should I use this medicine? Take this medicine by mouth as needed. At the first sign of an angina attack (chest pain or tightness) place one tablet under your tongue. You can also take this medicine 5 to 10 minutes before an event likely to produce chest pain. Follow the directions on the prescription label. Let the tablet dissolve under the tongue. Do not swallow whole. Replace the dose if you accidentally swallow it. It will help if your mouth is not dry. Saliva around the tablet will help it to dissolve more quickly. Do not eat or drink, smoke or chew tobacco while a tablet is dissolving. If you are not better within 5 minutes after taking ONE dose of nitroglycerin, call 9-1-1 immediately to seek emergency medical care. Do not take more than 3 nitroglycerin tablets over 15 minutes. If you take this medicine often to relieve symptoms of angina, your doctor  or health care professional may provide you with different instructions to manage your symptoms. If symptoms do not go away after following these instructions, it is important to call 9-1-1 immediately. Do not take more than 3 nitroglycerin tablets over 15 minutes. Talk to your pediatrician regarding the use of this medicine in children. Special care may be needed. Overdosage: If you think you have taken too much of this medicine contact a poison control center or emergency room at once. NOTE: This medicine is only for you.  Do not share this medicine with others. What if I miss a dose? This does not apply. This medicine is only used as needed. What may interact with this medicine? Do not take this medicine with any of the following medications: -certain migraine medicines like ergotamine and dihydroergotamine (DHE) -medicines used to treat erectile dysfunction like sildenafil, tadalafil, and vardenafil -riociguat This medicine may also interact with the following medications: -alteplase -aspirin -heparin -medicines for high blood pressure -medicines for mental depression -other medicines used to treat angina -phenothiazines like chlorpromazine, mesoridazine, prochlorperazine, thioridazine This list may not describe all possible interactions. Give your health care provider a list of all the medicines, herbs, non-prescription drugs, or dietary supplements you use. Also tell them if you smoke, drink alcohol, or use illegal drugs. Some items may interact with your medicine. What should I watch for while using this medicine? Tell your doctor or health care professional if you feel your medicine is no longer working. Keep this medicine with you at all times. Sit or lie down when you take your medicine to prevent falling if you feel dizzy or faint after using it. Try to remain calm. This will help you to feel better faster. If you feel dizzy, take several deep breaths and lie down with your feet propped up, or bend forward with your head resting between your knees. You may get drowsy or dizzy. Do not drive, use machinery, or do anything that needs mental alertness until you know how this drug affects you. Do not stand or sit up quickly, especially if you are an older patient. This reduces the risk of dizzy or fainting spells. Alcohol can make you more drowsy and dizzy. Avoid alcoholic drinks. Do not treat yourself for coughs, colds, or pain while you are taking this medicine without asking your doctor or health care  professional for advice. Some ingredients may increase your blood pressure. What side effects may I notice from receiving this medicine? Side effects that you should report to your doctor or health care professional as soon as possible: -blurred vision -dry mouth -skin rash -sweating -the feeling of extreme pressure in the head -unusually weak or tired Side effects that usually do not require medical attention (report to your doctor or health care professional if they continue or are bothersome): -flushing of the face or neck -headache -irregular heartbeat, palpitations -nausea, vomiting This list may not describe all possible side effects. Call your doctor for medical advice about side effects. You may report side effects to FDA at 1-800-FDA-1088. Where should I keep my medicine? Keep out of the reach of children. Store at room temperature between 20 and 25 degrees C (68 and 77 degrees F). Store in Retail buyer. Protect from light and moisture. Keep tightly closed. Throw away any unused medicine after the expiration date. NOTE: This sheet is a summary. It may not cover all possible information. If you have questions about this medicine, talk to your doctor, pharmacist, or health care provider.  2016, Elsevier/Gold Standard. (2013-08-17 17:57:36)

## 2015-11-10 NOTE — Progress Notes (Signed)
ANTICOAGULATION CONSULT NOTE - Preliminary  Pharmacy Consult for heparin Indication: chest pain/ACS  No Known Allergies  Patient Measurements: Height: 5\' 5"  (165.1 cm) Weight: 101 lb (45.813 kg) IBW/kg (Calculated) : 57 HEPARIN DW (KG): 45.8   Vital Signs: Temp: 98.2 F (36.8 C) (01/07 2358) Temp Source: Oral (01/07 2358) BP: 131/77 mmHg (01/08 0030) Pulse Rate: 80 (01/08 0030)  Labs:  Recent Labs  11/09/15 2358  HGB 16.2*  HCT 47.7*  PLT 173  CREATININE 1.06*  TROPONINI 0.69*   Estimated Creatinine Clearance: 34.2 mL/min (by C-G formula based on Cr of 1.06).  Medical History: Past Medical History  Diagnosis Date  . MI (myocardial infarction) (HCC)     Medications:  Infusions:  . heparin      Assessment: 75 yo with h/o CAD had onset pain in arms radiating to back.  Previous heart attack in 2006.   CBC done, coags ordered.  Goal of Therapy:  Heparin level 0.3-0.7 units/ml   Plan:  Give 2500 units bolus x 1 Start heparin infusion at 550 units/hr Check anti-Xa level in 8 hours and daily while on heparin Continue to monitor H&H and platelets   Preliminary review of pertinent patient information completed.  Jeani HawkingAnnie Penn clinical pharmacist will complete review during morning rounds to assess the patient and finalize treatment regimen.  Mayvis Agudelo, Berneice Heinrichiffany Scarlett, RPH 11/10/2015,12:53 AM

## 2015-11-10 NOTE — ED Notes (Signed)
CRITICAL VALUE ALERT  Critical value received: trop 0.69  Date of notification:  11/10/2015  Time of notification:  1242  Critical value read back:yes  Nurse who received alert:  Bennetta LaosPenny Falcon Mccaskey,rn  MD notified (1st page):  1242  Time of first page:  1242  MD notified (2nd page):  Time of second page:  Responding MD: [email or web address]  Time MD responded:  1242

## 2015-11-10 NOTE — ED Notes (Signed)
Patient states that the pain  Radiates to both arms

## 2016-04-14 ENCOUNTER — Emergency Department (HOSPITAL_COMMUNITY): Payer: Medicare Other

## 2016-04-14 ENCOUNTER — Inpatient Hospital Stay (HOSPITAL_COMMUNITY): Payer: Medicare Other

## 2016-04-14 ENCOUNTER — Encounter (HOSPITAL_COMMUNITY): Payer: Self-pay | Admitting: Emergency Medicine

## 2016-04-14 ENCOUNTER — Inpatient Hospital Stay (HOSPITAL_COMMUNITY)
Admission: EM | Admit: 2016-04-14 | Discharge: 2016-04-18 | DRG: 291 | Disposition: A | Discharge status: Home Health | Payer: Medicare Other | Attending: Internal Medicine | Admitting: Internal Medicine

## 2016-04-14 DIAGNOSIS — F17210 Nicotine dependence, cigarettes, uncomplicated: Secondary | ICD-10-CM | POA: Diagnosis present

## 2016-04-14 DIAGNOSIS — A419 Sepsis, unspecified organism: Secondary | ICD-10-CM | POA: Diagnosis not present

## 2016-04-14 DIAGNOSIS — Z7982 Long term (current) use of aspirin: Secondary | ICD-10-CM

## 2016-04-14 DIAGNOSIS — I252 Old myocardial infarction: Secondary | ICD-10-CM | POA: Diagnosis not present

## 2016-04-14 DIAGNOSIS — J9601 Acute respiratory failure with hypoxia: Secondary | ICD-10-CM | POA: Diagnosis not present

## 2016-04-14 DIAGNOSIS — E876 Hypokalemia: Secondary | ICD-10-CM | POA: Diagnosis present

## 2016-04-14 DIAGNOSIS — I472 Ventricular tachycardia: Secondary | ICD-10-CM

## 2016-04-14 DIAGNOSIS — G9340 Encephalopathy, unspecified: Secondary | ICD-10-CM | POA: Diagnosis not present

## 2016-04-14 DIAGNOSIS — R7989 Other specified abnormal findings of blood chemistry: Secondary | ICD-10-CM

## 2016-04-14 DIAGNOSIS — E785 Hyperlipidemia, unspecified: Secondary | ICD-10-CM | POA: Diagnosis present

## 2016-04-14 DIAGNOSIS — Z681 Body mass index (BMI) 19 or less, adult: Secondary | ICD-10-CM | POA: Diagnosis not present

## 2016-04-14 DIAGNOSIS — I5021 Acute systolic (congestive) heart failure: Secondary | ICD-10-CM | POA: Diagnosis not present

## 2016-04-14 DIAGNOSIS — R0600 Dyspnea, unspecified: Secondary | ICD-10-CM | POA: Diagnosis not present

## 2016-04-14 DIAGNOSIS — I5023 Acute on chronic systolic (congestive) heart failure: Secondary | ICD-10-CM | POA: Diagnosis not present

## 2016-04-14 DIAGNOSIS — I429 Cardiomyopathy, unspecified: Secondary | ICD-10-CM | POA: Diagnosis not present

## 2016-04-14 DIAGNOSIS — D7589 Other specified diseases of blood and blood-forming organs: Secondary | ICD-10-CM | POA: Diagnosis present

## 2016-04-14 DIAGNOSIS — I471 Supraventricular tachycardia: Secondary | ICD-10-CM | POA: Diagnosis present

## 2016-04-14 DIAGNOSIS — Z79899 Other long term (current) drug therapy: Secondary | ICD-10-CM | POA: Diagnosis not present

## 2016-04-14 DIAGNOSIS — E46 Unspecified protein-calorie malnutrition: Secondary | ICD-10-CM | POA: Diagnosis not present

## 2016-04-14 DIAGNOSIS — J189 Pneumonia, unspecified organism: Secondary | ICD-10-CM | POA: Insufficient documentation

## 2016-04-14 DIAGNOSIS — I2510 Atherosclerotic heart disease of native coronary artery without angina pectoris: Secondary | ICD-10-CM

## 2016-04-14 DIAGNOSIS — Z8249 Family history of ischemic heart disease and other diseases of the circulatory system: Secondary | ICD-10-CM

## 2016-04-14 DIAGNOSIS — I110 Hypertensive heart disease with heart failure: Secondary | ICD-10-CM | POA: Diagnosis not present

## 2016-04-14 DIAGNOSIS — Z9861 Coronary angioplasty status: Secondary | ICD-10-CM

## 2016-04-14 DIAGNOSIS — Z955 Presence of coronary angioplasty implant and graft: Secondary | ICD-10-CM

## 2016-04-14 DIAGNOSIS — J441 Chronic obstructive pulmonary disease with (acute) exacerbation: Secondary | ICD-10-CM | POA: Diagnosis present

## 2016-04-14 DIAGNOSIS — D696 Thrombocytopenia, unspecified: Secondary | ICD-10-CM | POA: Diagnosis present

## 2016-04-14 DIAGNOSIS — R627 Adult failure to thrive: Secondary | ICD-10-CM | POA: Diagnosis present

## 2016-04-14 DIAGNOSIS — Z720 Tobacco use: Secondary | ICD-10-CM

## 2016-04-14 DIAGNOSIS — D751 Secondary polycythemia: Secondary | ICD-10-CM | POA: Diagnosis present

## 2016-04-14 DIAGNOSIS — I25119 Atherosclerotic heart disease of native coronary artery with unspecified angina pectoris: Secondary | ICD-10-CM | POA: Diagnosis not present

## 2016-04-14 DIAGNOSIS — R6510 Systemic inflammatory response syndrome (SIRS) of non-infectious origin without acute organ dysfunction: Secondary | ICD-10-CM

## 2016-04-14 DIAGNOSIS — I4729 Other ventricular tachycardia: Secondary | ICD-10-CM

## 2016-04-14 DIAGNOSIS — R0602 Shortness of breath: Secondary | ICD-10-CM | POA: Diagnosis not present

## 2016-04-14 DIAGNOSIS — IMO0001 Reserved for inherently not codable concepts without codable children: Secondary | ICD-10-CM | POA: Diagnosis present

## 2016-04-14 DIAGNOSIS — R9431 Abnormal electrocardiogram [ECG] [EKG]: Secondary | ICD-10-CM | POA: Diagnosis not present

## 2016-04-14 LAB — CBC WITH DIFFERENTIAL/PLATELET
Basophils Absolute: 0 10*3/uL (ref 0.0–0.1)
Basophils Relative: 0 %
Eosinophils Absolute: 0 10*3/uL (ref 0.0–0.7)
Eosinophils Relative: 0 %
HCT: 51.3 % — ABNORMAL HIGH (ref 36.0–46.0)
HEMOGLOBIN: 17.4 g/dL — AB (ref 12.0–15.0)
LYMPHS ABS: 1 10*3/uL (ref 0.7–4.0)
LYMPHS PCT: 10 %
MCH: 32.8 pg (ref 26.0–34.0)
MCHC: 33.9 g/dL (ref 30.0–36.0)
MCV: 96.6 fL (ref 78.0–100.0)
Monocytes Absolute: 0.5 10*3/uL (ref 0.1–1.0)
Monocytes Relative: 6 %
NEUTROS PCT: 84 %
Neutro Abs: 7.9 10*3/uL — ABNORMAL HIGH (ref 1.7–7.7)
Platelets: 142 10*3/uL — ABNORMAL LOW (ref 150–400)
RBC: 5.31 MIL/uL — AB (ref 3.87–5.11)
RDW: 13.7 % (ref 11.5–15.5)
WBC: 9.4 10*3/uL (ref 4.0–10.5)

## 2016-04-14 LAB — URINALYSIS, ROUTINE W REFLEX MICROSCOPIC
Bilirubin Urine: NEGATIVE
Glucose, UA: NEGATIVE mg/dL
Ketones, ur: NEGATIVE mg/dL
NITRITE: NEGATIVE
Protein, ur: NEGATIVE mg/dL
SPECIFIC GRAVITY, URINE: 1.025 (ref 1.005–1.030)
pH: 5.5 (ref 5.0–8.0)

## 2016-04-14 LAB — URINE MICROSCOPIC-ADD ON

## 2016-04-14 LAB — LIPASE, BLOOD: LIPASE: 18 U/L (ref 11–51)

## 2016-04-14 LAB — COMPREHENSIVE METABOLIC PANEL
ALT: 27 U/L (ref 14–54)
AST: 38 U/L (ref 15–41)
Albumin: 4 g/dL (ref 3.5–5.0)
Alkaline Phosphatase: 99 U/L (ref 38–126)
Anion gap: 10 (ref 5–15)
BUN: 14 mg/dL (ref 6–20)
CHLORIDE: 103 mmol/L (ref 101–111)
CO2: 26 mmol/L (ref 22–32)
Calcium: 9 mg/dL (ref 8.9–10.3)
Creatinine, Ser: 1.01 mg/dL — ABNORMAL HIGH (ref 0.44–1.00)
GFR calc Af Amer: >60 mL/min (ref >60)
GFR, EST NON AFRICAN AMERICAN: 53 mL/min — AB (ref >60)
Glucose, Bld: 97 mg/dL (ref 65–99)
POTASSIUM: 3.9 mmol/L (ref 3.5–5.1)
SODIUM: 139 mmol/L (ref 135–145)
Total Bilirubin: 1.6 mg/dL — ABNORMAL HIGH (ref 0.3–1.2)
Total Protein: 6.6 g/dL (ref 6.5–8.1)

## 2016-04-14 LAB — I-STAT TROPONIN, ED: Troponin i, poc: 0.04 ng/mL (ref 0.00–0.08)

## 2016-04-14 LAB — I-STAT CG4 LACTIC ACID, ED: LACTIC ACID, VENOUS: 1.27 mmol/L (ref 0.5–2.0)

## 2016-04-14 LAB — TROPONIN I: TROPONIN I: 0.08 ng/mL — AB (ref <0.031)

## 2016-04-14 LAB — PROCALCITONIN: Procalcitonin: <0.1 ng/mL

## 2016-04-14 LAB — BRAIN NATRIURETIC PEPTIDE: B Natriuretic Peptide: 1047 pg/mL — ABNORMAL HIGH (ref 0.0–100.0)

## 2016-04-14 LAB — D-DIMER, QUANTITATIVE: D-Dimer, Quant: 1.95 ug/mL-FEU — ABNORMAL HIGH (ref 0.00–0.50)

## 2016-04-14 MED ORDER — ENOXAPARIN SODIUM 30 MG/0.3ML ~~LOC~~ SOLN
30.0000 mg | SUBCUTANEOUS | Status: DC
  Administered 2016-04-14 – 2016-04-15 (×2): 30 mg via SUBCUTANEOUS
  Filled 2016-04-14 (×3): qty 0.3

## 2016-04-14 MED ORDER — IPRATROPIUM-ALBUTEROL 0.5-2.5 (3) MG/3ML IN SOLN: 3.0000 mL | Freq: Once | RESPIRATORY_TRACT | Status: DC

## 2016-04-14 MED ORDER — LEVOFLOXACIN IN D5W 500 MG/100ML IV SOLN: 500.0000 mg | Freq: Once | INTRAVENOUS | Status: DC

## 2016-04-14 MED ORDER — IPRATROPIUM BROMIDE 0.02 % IN SOLN
0.5000 mg | RESPIRATORY_TRACT | Status: DC | PRN
Start: 2016-04-14 — End: 2016-04-18
  Administered 2016-04-16: 0.5 mg via RESPIRATORY_TRACT
  Filled 2016-04-14: qty 2.5

## 2016-04-14 MED ORDER — METOPROLOL TARTRATE 25 MG PO TABS
12.5000 mg | ORAL_TABLET | Freq: Two times a day (BID) | ORAL | Status: DC
  Administered 2016-04-14 – 2016-04-15 (×3): 12.5 mg via ORAL
  Filled 2016-04-14 (×3): qty 1

## 2016-04-14 MED ORDER — LORAZEPAM 2 MG/ML IJ SOLN
0.5000 mg | INTRAMUSCULAR | Status: DC | PRN
  Administered 2016-04-14 – 2016-04-17 (×3): 0.5 mg via INTRAVENOUS
  Filled 2016-04-14 (×3): qty 1

## 2016-04-14 MED ORDER — DEXTROSE 5 % IV SOLN
INTRAVENOUS | Status: AC
  Administered 2016-04-14: 20:00:00
  Filled 2016-04-14: qty 500

## 2016-04-14 MED ORDER — DEXTROSE 5 % IV SOLN
500.0000 mg | INTRAVENOUS | Status: DC
  Administered 2016-04-14: 500 mg via INTRAVENOUS
  Filled 2016-04-14 (×2): qty 500

## 2016-04-14 MED ORDER — IPRATROPIUM BROMIDE 0.02 % IN SOLN
0.5000 mg | Freq: Once | RESPIRATORY_TRACT | Status: AC
  Administered 2016-04-14: 0.5 mg via RESPIRATORY_TRACT
  Filled 2016-04-14: qty 2.5

## 2016-04-14 MED ORDER — LEVALBUTEROL HCL 1.25 MG/0.5ML IN NEBU: 1.2500 mg | INHALATION_SOLUTION | RESPIRATORY_TRACT | Status: DC | PRN

## 2016-04-14 MED ORDER — DEXTROSE 5 % IV SOLN
INTRAVENOUS | Status: AC
  Administered 2016-04-14: 20:00:00
  Filled 2016-04-14: qty 10

## 2016-04-14 MED ORDER — ALBUTEROL SULFATE (2.5 MG/3ML) 0.083% IN NEBU: 2.5000 mg | INHALATION_SOLUTION | Freq: Once | RESPIRATORY_TRACT | Status: DC

## 2016-04-14 MED ORDER — FUROSEMIDE 10 MG/ML IJ SOLN
40.0000 mg | Freq: Once | INTRAMUSCULAR | Status: AC
  Administered 2016-04-14: 40 mg via INTRAVENOUS
  Filled 2016-04-14: qty 4

## 2016-04-14 MED ORDER — SODIUM CHLORIDE 0.9 % IV BOLUS (SEPSIS): 500.0000 mL | Freq: Once | INTRAVENOUS | Status: DC

## 2016-04-14 MED ORDER — NITROGLYCERIN 0.4 MG SL SUBL: 0.4000 mg | SUBLINGUAL_TABLET | SUBLINGUAL | Status: DC | PRN

## 2016-04-14 MED ORDER — LEVALBUTEROL HCL 1.25 MG/0.5ML IN NEBU
2.5000 mg | INHALATION_SOLUTION | Freq: Once | RESPIRATORY_TRACT | Status: AC
  Administered 2016-04-14: 2.5 mg via RESPIRATORY_TRACT
  Filled 2016-04-14: qty 1

## 2016-04-14 MED ORDER — SIMVASTATIN 10 MG PO TABS
40.0000 mg | ORAL_TABLET | Freq: Every day | ORAL | Status: DC
  Administered 2016-04-14 – 2016-04-17 (×4): 40 mg via ORAL
  Filled 2016-04-14 (×4): qty 4

## 2016-04-14 MED ORDER — LORAZEPAM 2 MG/ML IJ SOLN
0.5000 mg | Freq: Once | INTRAMUSCULAR | Status: AC
  Administered 2016-04-14: 0.5 mg via INTRAVENOUS
  Filled 2016-04-14: qty 1

## 2016-04-14 MED ORDER — ASPIRIN 81 MG PO CHEW
81.0000 mg | CHEWABLE_TABLET | Freq: Every day | ORAL | Status: DC
  Administered 2016-04-15 – 2016-04-18 (×4): 81 mg via ORAL
  Filled 2016-04-14 (×4): qty 1

## 2016-04-14 MED ORDER — DEXTROSE 5 % IV SOLN
1.0000 g | INTRAVENOUS | Status: DC
  Administered 2016-04-14: 1 g via INTRAVENOUS
  Filled 2016-04-14: qty 10

## 2016-04-14 MED ORDER — IOPAMIDOL (ISOVUE-370) INJECTION 76%
100.0000 mL | Freq: Once | INTRAVENOUS | Status: AC | PRN
  Administered 2016-04-14: 100 mL via INTRAVENOUS

## 2016-04-14 MED ORDER — GUAIFENESIN-DM 100-10 MG/5ML PO SYRP
5.0000 mL | ORAL_SOLUTION | ORAL | Status: DC | PRN
  Administered 2016-04-14 – 2016-04-17 (×2): 5 mL via ORAL
  Filled 2016-04-14 (×2): qty 5

## 2016-04-14 MED ORDER — SODIUM CHLORIDE 0.9 % IV SOLN: INTRAVENOUS | Status: DC

## 2016-04-14 NOTE — ED Notes (Signed)
Pt c/o weakness/fatigue and n/v that began last night. Denies diarrhea.

## 2016-04-14 NOTE — H&P (Signed)
History and Physical    Ruth Davidson ZOX:[id number] DOB: 06-10-1941 DOA: 04/14/2016  PCP: Cassell Smiles., MD   Patient coming from: Home   Chief Complaint: Fatigue, malaise, dyspnea, N/V  HPI: Ruth Davidson is a 75 y.o. female with medical history significant for coronary artery disease with stent and ongoing tobacco abuse who presents the emergency department with approximately 3 days of progressive fatigue, malaise, dyspnea, and an episode of nausea with vomiting last night. Patient is quite somnolent at time of my exam after receiving Ativan injection in the emergency department, unable to contribute much to the history, which is therefore gleaned from discussion with the ED personnel, review of the EMR, and discussion with the patient's son at the bedside. Ruth Davidson lives alone with her son checking in on her frequently. She had reportedly been feeling ill with generalized weakness, dyspnea, and malaise for the past 3 days, prompting her son to call EMS out to the house yesterday. She was reportedly saturating well on room air for EMS yesterday and remained at home. Today, her son found her condition to have worsened with fever and increased somnolence and brought her in for evaluation. She had not made any complaints of chest pain or diarrhea. She has not complained of headaches and did not appear to be confused. She was ambulating appropriately prior to arrival.  ED Course: Upon arrival to the ED, patient is found to be febrile to 38.1 C, saturating adequately on room air, but tachypneic to 31, and with sinus tachycardia to 120s. Blood pressure has remained stable in the emergency department. EKG features a sinus tachycardia with rate 124 and LVH by voltage criteria with secondary repolarization abnormality. Troponin is at the upper limits of normal at 0.04. Chest x-ray demonstrates cardiomegaly with mild interstitial edema and small bilateral effusions with associated bibasilar airspace  disease suggestive of atelectasis or pneumonia. Chemistry panel features mild elevations in serum creatinine and total bilirubin to 1.01 and 1.6, respectively. CBC is notable for hemoglobin elevated to 17.4, mild thrombocytopenia with platelet count 142,000. And normal WBC, but with a neutrophilia, ANC 7900. Urine was obtained for analysis and grossly normal. Lactic acid is reassuring at 1.27. Patient was given a dose of Levaquin and treated symptomatically with Ativan and nebulized breathing treatments. She remained tachycardic in the 120s but was stable blood pressure and will be admitted to the telemetry unit for ongoing evaluation and management of suspected community-acquired pneumonia.  Review of Systems:  All other systems reviewed and apart from HPI, are negative.  Past Medical History  Diagnosis Date  . MI (myocardial infarction) (HCC)     History reviewed. No pertinent past surgical history.   reports that she has been smoking Cigarettes.  She started smoking about 57 years ago. She has a 12.5 pack-year smoking history. She has never used smokeless tobacco. Her alcohol and drug histories are not on file.  No Known Allergies  History reviewed. No pertinent family history.   Prior to Admission medications   Medication Sig Start Date End Date Taking? Authorizing Provider  metoprolol tartrate (LOPRESSOR) 25 MG tablet Take 1 tablet (25 mg total) by mouth 2 (two) times daily. Patient not taking: Reported on 04/14/2016 10/12/14   Laqueta Linden, MD  nitroGLYCERIN (NITROSTAT) 0.4 MG SL tablet Place 1 tablet (0.4 mg total) under the tongue every 5 (five) minutes as needed for chest pain (or back pain or arm pain). 11/10/15   Dione Booze, MD  simvastatin (ZOCOR) 80  MG tablet Take 1 tablet (80 mg total) by mouth at bedtime. Patient not taking: Reported on 04/14/2016 10/11/14   Laqueta Linden, MD  spironolactone (ALDACTONE) 25 MG tablet Take 0.5 tablets (12.5 mg total) by mouth  daily. Patient not taking: Reported on 04/14/2016 10/08/14   Laqueta Linden, MD    Physical Exam: Filed Vitals:   04/14/16 1700 04/14/16 1830 04/14/16 1912 04/14/16 1928  BP: 123/86 137/90 130/79   Pulse: 123 126 72   Temp:      TempSrc:      Resp: 31  17   Height:      Weight:      SpO2: 100% 96% 99% 97%      Constitutional: NAD, calm, somnolent Eyes: PERTLA, lids and conjunctivae normal ENMT: Mucous membranes are moist. Posterior pharynx clear of any exudate or lesions.   Neck: normal, supple, no masses, no thyromegaly Respiratory: Bibasilar rhonchi, occasional expiratory wheeze. Normal respiratory effort. No accessory muscle use.  Cardiovascular: Rate ~120 and regular. 1+ pedal edema b/l. No significant JVD. Abdomen: No distension, no tenderness, no masses palpated. Bowel sounds normal.  Musculoskeletal: no clubbing / cyanosis. No joint deformity upper and lower extremities. Normal muscle tone.  Skin: no significant rashes, lesions, ulcers. Warm, dry, well-perfused. Neurologic: Somnolent, CN 2-12 grossly intact. Sensation intact, DTR normal. Moving all extremities spontaneously.  Psychiatric: Difficult to assess on admission given the clinical scenario.     Labs on Admission: I have personally reviewed following labs and imaging studies  CBC:  Recent Labs Lab 04/14/16 1640  WBC 9.4  NEUTROABS 7.9*  HGB 17.4*  HCT 51.3*  MCV 96.6  PLT 142*   Basic Metabolic Panel:  Recent Labs Lab 04/14/16 1757  NA 139  K 3.9  CL 103  CO2 26  GLUCOSE 97  BUN 14  CREATININE 1.01*  CALCIUM 9.0   GFR: Estimated Creatinine Clearance: 34.6 mL/min (by C-G formula based on Cr of 1.01). Liver Function Tests:  Recent Labs Lab 04/14/16 1757  AST 38  ALT 27  ALKPHOS 99  BILITOT 1.6*  PROT 6.6  ALBUMIN 4.0    Recent Labs Lab 04/14/16 1757  LIPASE 18   No results for input(s): AMMONIA in the last 168 hours. Coagulation Profile: No results for input(s): INR,  PROTIME in the last 168 hours. Cardiac Enzymes: No results for input(s): CKTOTAL, CKMB, CKMBINDEX, TROPONINI in the last 168 hours. BNP (last 3 results) No results for input(s): PROBNP in the last 8760 hours. HbA1C: No results for input(s): HGBA1C in the last 72 hours. CBG: No results for input(s): GLUCAP in the last 168 hours. Lipid Profile: No results for input(s): CHOL, HDL, LDLCALC, TRIG, CHOLHDL, LDLDIRECT in the last 72 hours. Thyroid Function Tests: No results for input(s): TSH, T4TOTAL, FREET4, T3FREE, THYROIDAB in the last 72 hours. Anemia Panel: No results for input(s): VITAMINB12, FOLATE, FERRITIN, TIBC, IRON, RETICCTPCT in the last 72 hours. Urine analysis:    Component Value Date/Time   COLORURINE YELLOW 04/14/2016 1605   APPEARANCEUR CLEAR 04/14/2016 1605   LABSPEC 1.025 04/14/2016 1605   PHURINE 5.5 04/14/2016 1605   GLUCOSEU NEGATIVE 04/14/2016 1605   HGBUR SMALL* 04/14/2016 1605   BILIRUBINUR NEGATIVE 04/14/2016 1605   KETONESUR NEGATIVE 04/14/2016 1605   PROTEINUR NEGATIVE 04/14/2016 1605   NITRITE NEGATIVE 04/14/2016 1605   LEUKOCYTESUR SMALL* 04/14/2016 1605   Sepsis Labs: @LABRCNTIP (procalcitonin:4,lacticidven:4) )No results found for this or any previous visit (from the past 240 hour(s)).   Radiological Exams  on Admission: Dg Chest 2 View  04/14/2016  CLINICAL DATA:  Shortness of breath, fever and weakness. EXAM: CHEST  2 VIEW COMPARISON:  PA and lateral chest 08/11/2004. Single view of the chest 11/10/2015. FINDINGS: The lungs are markedly emphysematous. There are small bilateral pleural effusions with basilar airspace disease, worse on the right. Heart size is enlarged with mild interstitial edema seen. No pneumothorax. IMPRESSION: Cardiomegaly and mild interstitial edema. Small bilateral pleural effusions and basilar airspace disease, greater on the right. Airspace disease could be due to atelectasis or pneumonia. Electronically Signed   By: Drusilla Kanner M.D.   On: 04/14/2016 17:22    EKG: Independently reviewed. Sinus tachycardia (rate 124), VPC, LVH by voltage criteria with secondary repolarization abnormality   Assessment/Plan  1. SIRS, suspected secondary to CAP  - Presents with sinus tachycardia, fever, neutrophilia, and infiltrates on CXR suggestive of CAP - Blood cultures, PCT, and urine antigens to strep pneumo and legionella ordered and pending  - Lactic acid reassuring at 1.27; no evidence for acute end-organ damage  - Levaquin ordered from ED, will change to Rocephin and azithromycin empirically  - Continue neb treatments with Xopenex and ipratropium q4h prn  - Given marked elevation in BNP and CXR findings suggestive of mild interstitial edema, in addition to normal lactate, no fluid bolus given  - Providing a gentle IVF hydration given sinus tachycardia in setting of acute infection; may require diuresis once acute infectious process resolving - Low pretest-probability for PE, which is only considered d/t the persistent tachycardia despite apparent volume excess; will r/o with d-dimer, obtain CTA PE study if positive    2. 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 ?Acute CHF  - CXR with cardiomegaly and mild interstitial edema on admission; BNP elevated to 1047 - TTE from March 2010 with normal EF, no significant valvular disease, and no indication of diastolic dysfunction; echo from 2007 featured EF 35-40%   - Previously managed with Aldactone, has not been taking any longer for unclear reasons  - Hesitant to diurese at time of admission given the acute infection and suspicion that she needs the volume right now in light of this   - Providing a gentle IVF hydration at time of admission with NS at 75 cc/hr in light of sinus tachycardia, while also checking d-dimer as contributor to the tachycardia   4. Tobacco abuse  - Pt quite somnolent on admission following a dose of IV Ativan; unable to hold meaningful discussion at this time  - RN asked to provide smoking cessation information prior to discharge  - Nicotine patch can be ordered if pt desires    5. Polycythemia, thrombocytopenia - Hgb 17.4; platelets 142,000 on admission  - Suspect the polycythemia is secondary to smoking with possible contribution from decreased plasma volume in setting of PNA; undiagnosed COPD may also be contributing  - Thrombocytosis etiology uncertain at this time, possibly secondary  acute infection; no sign of bleeding; will repeat CBC in am     DVT prophylaxis: sq Lovenox  Code Status: Full  Family Communication: Son updated at bedside  Disposition Plan: Admit to telemetry   Consults called: None  Admission status: Inpatient    Briscoe Deutscherimothy S Roselina Burgueno, MD [facility] Pager [phone]  If 7PM-7AM, please contact night-coverage [email or web address] Password [email or web address]  04/14/2016, 7:49 PM

## 2016-04-14 NOTE — ED Provider Notes (Signed)
CSN: [id number]     Arrival date & time 04/14/16  1516 History   First MD Initiated Contact with Patient 04/14/16 1557     Chief Complaint  Patient presents with  . Illness     (Consider location/radiation/quality/duration/timing/severity/associated sxs/prior Treatment) Patient is a 75 y.o. female presenting with general illness. The history is provided by the patient (Patient complains of fever chills and some shortness of breath).  Illness Severity:  Moderate Onset quality:  Sudden Progression:  Waxing and waning Chronicity:  New Associated symptoms: fatigue, fever and shortness of breath   Associated symptoms: no abdominal pain, no chest pain, no congestion, no cough, no diarrhea, no headaches and no rash     Past Medical History  Diagnosis Date  . MI (myocardial infarction) (HCC)    History reviewed. No pertinent past surgical history. No family history on file. Social History  Substance Use Topics  . Smoking status: Current Every Day Smoker -- 0.25 packs/day for 50 years    Types: Cigarettes    Start date: 11/02/1958  . Smokeless tobacco: Never Used  . Alcohol Use: None   OB History    No data available     Review of Systems  Constitutional: Positive for fever and fatigue. Negative for appetite change.  HENT: Negative for congestion, ear discharge and sinus pressure.   Eyes: Negative for discharge.  Respiratory: Positive for shortness of breath. Negative for cough.   Cardiovascular: Negative for chest pain.  Gastrointestinal: Negative for abdominal pain and diarrhea.  Genitourinary: Negative for frequency and hematuria.  Musculoskeletal: Negative for back pain.  Skin: Negative for rash.  Neurological: Negative for seizures and headaches.  Psychiatric/Behavioral: Negative for hallucinations.      Allergies  Review of patient's allergies indicates no known allergies.  Home Medications   Prior to Admission medications   Medication Sig Start Date End Date  Taking? Authorizing Provider  metoprolol tartrate (LOPRESSOR) 25 MG tablet Take 1 tablet (25 mg total) by mouth 2 (two) times daily. Patient not taking: Reported on 04/14/2016 10/12/14   Laqueta Linden, MD  nitroGLYCERIN (NITROSTAT) 0.4 MG SL tablet Place 1 tablet (0.4 mg total) under the tongue every 5 (five) minutes as needed for chest pain (or back pain or arm pain). 11/10/15   Dione Booze, MD  simvastatin (ZOCOR) 80 MG tablet Take 1 tablet (80 mg total) by mouth at bedtime. Patient not taking: Reported on 04/14/2016 10/11/14   Laqueta Linden, MD  spironolactone (ALDACTONE) 25 MG tablet Take 0.5 tablets (12.5 mg total) by mouth daily. Patient not taking: Reported on 04/14/2016 10/08/14   Laqueta Linden, MD   BP 130/79 mmHg  Pulse 72  Temp(Src) 100.5 F (38.1 C) (Rectal)  Resp 17  Ht  (1.676 m)  Wt 99 lb (44.906 kg)  BMI 15.99 kg/m2  SpO2 99% Physical Exam  Constitutional: She is oriented to person, place, and time. She appears well-developed.  HENT:  Head: Normocephalic.  Eyes: Conjunctivae and EOM are normal. No scleral icterus.  Neck: Neck supple. No thyromegaly present.  Cardiovascular: Normal rate and regular rhythm.  Exam reveals no gallop and no friction rub.   No murmur heard. Pulmonary/Chest: No stridor. She has no wheezes. She has rales. She exhibits no tenderness.  Abdominal: She exhibits no distension. There is no tenderness. There is no rebound.  Musculoskeletal: Normal range of motion. She exhibits no edema.  Lymphadenopathy:    She has no cervical adenopathy.  Neurological: She is oriented  to person, place, and time. She exhibits normal muscle tone. Coordination normal.  Skin: No rash noted. No erythema.  Psychiatric: She has a normal mood and affect. Her behavior is normal.    ED Course  Procedures (including critical care time) Labs Review Labs Reviewed  COMPREHENSIVE METABOLIC PANEL - Abnormal; Notable for the following:    Creatinine, Ser  1.01 (*)    Total Bilirubin 1.6 (*)    GFR calc non Af Amer 53 (*)    All other components within normal limits  URINALYSIS, ROUTINE W REFLEX MICROSCOPIC (NOT AT [facility]) - Abnormal; Notable for the following:    Hgb urine dipstick SMALL (*)    Leukocytes, UA SMALL (*)    All other components within normal limits  CBC WITH DIFFERENTIAL/PLATELET - Abnormal; Notable for the following:    RBC 5.31 (*)    Hemoglobin 17.4 (*)    HCT 51.3 (*)    Platelets 142 (*)    Neutro Abs 7.9 (*)    All other components within normal limits  URINE MICROSCOPIC-ADD ON - Abnormal; Notable for the following:    Squamous Epithelial / LPF 0-5 (*)    Bacteria, UA RARE (*)    All other components within normal limits  LIPASE, BLOOD  BRAIN NATRIURETIC PEPTIDE  I-STAT TROPOININ, ED  I-STAT CG4 LACTIC ACID, ED  I-STAT CG4 LACTIC ACID, ED    Imaging Review Dg Chest 2 View  04/14/2016  CLINICAL DATA:  Shortness of breath, fever and weakness. EXAM: CHEST  2 VIEW COMPARISON:  PA and lateral chest 08/11/2004. Single view of the chest 11/10/2015. FINDINGS: The lungs are markedly emphysematous. There are small bilateral pleural effusions with basilar airspace disease, worse on the right. Heart size is enlarged with mild interstitial edema seen. No pneumothorax. IMPRESSION: Cardiomegaly and mild interstitial edema. Small bilateral pleural effusions and basilar airspace disease, greater on the right. Airspace disease could be due to atelectasis or pneumonia. Electronically Signed   By: Drusilla Kannerhomas  Dalessio M.D.   On: 04/14/2016 17:22   I have personally reviewed and evaluated these images and lab results as part of my medical decision-making.   EKG Interpretation None      MDM   Final diagnoses:  Community acquired pneumonia    Patient with pneumonia. And COPD exacerbation. She will be admitted for IV antibiotics    Bethann BerkshireJoseph Eli Pattillo, MD 04/14/16 1920

## 2016-04-15 ENCOUNTER — Inpatient Hospital Stay (HOSPITAL_COMMUNITY): Payer: Medicare Other

## 2016-04-15 DIAGNOSIS — I472 Ventricular tachycardia: Secondary | ICD-10-CM

## 2016-04-15 DIAGNOSIS — I4729 Other ventricular tachycardia: Secondary | ICD-10-CM

## 2016-04-15 DIAGNOSIS — R9431 Abnormal electrocardiogram [ECG] [EKG]: Secondary | ICD-10-CM

## 2016-04-15 DIAGNOSIS — I5021 Acute systolic (congestive) heart failure: Secondary | ICD-10-CM

## 2016-04-15 LAB — ECHOCARDIOGRAM COMPLETE
E decel time: 162 msec
E/e' ratio: 32.35
FS: 5 % — AB (ref 28–44)
HEIGHTINCHES: 66 in
IV/PV OW: 0.93
LA ID, A-P, ES: 33 mm
LA diam index: 2.31 cm/m2
LA vol A4C: 50.7 ml
LAVOL: 65.8 mL
LAVOLIN: 46 mL/m2
LEFT ATRIUM END SYS DIAM: 33 mm
LVEEAVG: 32.35
LVEEMED: 32.35
MV Dec: 162
MV pk E vel: 78.6 m/s
MVPG: 2 mmHg
MVPKAVEL: 52.9 m/s
PW: 12.6 mm — AB (ref 0.6–1.1)
TAPSE: 19.5 mm
TDI e' medial: 2.43
WEIGHTICAEL: 1467.73 [oz_av]

## 2016-04-15 LAB — CBC WITH DIFFERENTIAL/PLATELET
BASOS ABS: 0 10*3/uL (ref 0.0–0.1)
BASOS PCT: 0 %
EOS PCT: 0 %
Eosinophils Absolute: 0 10*3/uL (ref 0.0–0.7)
HCT: 45.7 % (ref 36.0–46.0)
Hemoglobin: 15.4 g/dL — ABNORMAL HIGH (ref 12.0–15.0)
Lymphocytes Relative: 9 %
Lymphs Abs: 0.8 10*3/uL (ref 0.7–4.0)
MCH: 32.6 pg (ref 26.0–34.0)
MCHC: 33.7 g/dL (ref 30.0–36.0)
MCV: 96.8 fL (ref 78.0–100.0)
MONO ABS: 0.2 10*3/uL (ref 0.1–1.0)
Monocytes Relative: 2 %
NEUTROS ABS: 7.6 10*3/uL (ref 1.7–7.7)
Neutrophils Relative %: 89 %
PLATELETS: 132 10*3/uL — AB (ref 150–400)
RBC: 4.72 MIL/uL (ref 3.87–5.11)
RDW: 13.8 % (ref 11.5–15.5)
WBC: 8.5 10*3/uL (ref 4.0–10.5)

## 2016-04-15 LAB — TROPONIN I: Troponin I: 0.1 ng/mL — ABNORMAL HIGH (ref <0.031)

## 2016-04-15 LAB — STREP PNEUMONIAE URINARY ANTIGEN: Strep Pneumo Urinary Antigen: NEGATIVE

## 2016-04-15 MED ORDER — FUROSEMIDE 10 MG/ML IJ SOLN
40.0000 mg | Freq: Two times a day (BID) | INTRAMUSCULAR | Status: DC
  Administered 2016-04-15 (×2): 40 mg via INTRAVENOUS
  Filled 2016-04-15 (×2): qty 4

## 2016-04-15 NOTE — Care Management Note (Signed)
Case Management Note  Patient Details  Name: Berneta SagesJudy M Stampley MRN: [id number] Date of Birth: [birth year]  Subjective/Objective:                  Pt admitted with CAP. Pt is from home, lives alone but in her son's home. Son is in the process of moving in with her. Pt is ind with ADL's. Pt has no difficulty affording her medications or getting to appointments. [facility]. PT has recommended SNF, however pt very drowsy and CM anticipates this recommendation will change as pt improves. DC plan discussed with pt's daughter at bedside. Pt plans to return home, may need HH depending on progress.   Action/Plan: Will cont to follow for DC planning.   Expected Discharge Date:  04/17/16               Expected Discharge Plan:  Home w Home Health Services  In-House Referral:  NA  Discharge planning Services  CM Consult  Post Acute Care Choice:  Home Health Choice offered to:  Patient  DME Arranged:    DME Agency:     HH Arranged:    HH Agency:     Status of Service:  In process, will continue to follow  Medicare Important Message Given:  Yes Date Medicare IM Given:    Medicare IM give by:    Date Additional Medicare IM Given:    Additional Medicare Important Message give by:     If discussed at Long Length of Stay Meetings, dates discussed:    Additional Comments:  Malcolm MetroChildress, Delania Ferg Demske, RN 04/15/2016, 3:45 PM

## 2016-04-15 NOTE — Evaluation (Signed)
Physical Therapy Evaluation Patient Details Name: Ruth Davidson MRN: [id number] DOB: 11-17-1940 Today's Date: 04/15/2016   History of Present Illness  75 yo F admitted with fatigue, malaise, dyspnea, N/V, and fever. CXR: atelectasis or PNA. CTA of the chest showed no PE, but had cardiac enlargement, B pleural effusions, diffuse airspace and interstitial disease - likely edema. Changes probably indicate congestive failure. PMH: CAD with stent, ongoing tobacco abuse, MI  Clinical Impression  Pt received in bed, very lethargic, but agreeable to PT evaluation.  Pt coughing quite a bit during evaluation, and was able to cough some mucous out.  Pt required Mod A for supine<>sit due to poor ability to follow commands.  Pt ambulated 3440ft with RW and Min A due to instability and poor endurance.  Pt will need 24/7 supervision/assistance upon discharge.  She states she has intermittent assistance at home, but not 24/7.  No family present during evaluation.  At this point, recommend SNF due to pt's decreased mobility level, lethargy, decreased strength, endurance and balance.      Follow Up Recommendations SNF    Equipment Recommendations       Recommendations for Other Services       Precautions / Restrictions Precautions Precautions: Fall Restrictions Weight Bearing Restrictions: No      Mobility  Bed Mobility Overal bed mobility: Needs Assistance Bed Mobility: Supine to Sit     Supine to sit: Mod assist     General bed mobility comments: Mod A required due to pt attempting to get out of the opposite side of the bed - towards bed rail - despite PT expressing which side to get out of.    Transfers Overall transfer level: Needs assistance Equipment used: Rolling walker (2 wheeled) Transfers: Sit to/from Stand Sit to Stand: Min assist         General transfer comment: Pt requires vc's for hand placement.   Ambulation/Gait Ambulation/Gait assistance: Min assist Ambulation  Distance (Feet): 40 Feet Assistive device: Rolling walker (2 wheeled) Gait Pattern/deviations: Step-to pattern;Shuffle;Trunk flexed     General Gait Details: Pt demonstrates slow cadence, and requires cues to stay in the base of the RW instead of holding it fwd.    Stairs            Wheelchair Mobility    Modified Rankin (Stroke Patients Only)       Balance Overall balance assessment: Needs assistance Sitting-balance support: Bilateral upper extremity supported Sitting balance-Leahy Scale: Fair     Standing balance support: Bilateral upper extremity supported Standing balance-Leahy Scale: Fair                               Pertinent Vitals/Pain Pain Assessment: No/denies pain    Home Living     Available Help at Discharge: Family;Available PRN/intermittently Type of Home: House Home Access: Level entry     Home Layout: One level Home Equipment: Cane - single point      Prior Function Level of Independence: Needs assistance   Gait / Transfers Assistance Needed: Pt states that she occasionally uses a cane.   ADL's / Homemaking Assistance Needed: assistance for dressing, and pt states that she is only able to perform sponge bathing         Hand Dominance   Dominant Hand: Right    Extremity/Trunk Assessment   Upper Extremity Assessment: Generalized weakness           Lower Extremity  Assessment: Generalized weakness      Cervical / Trunk Assessment: Kyphotic  Communication      Cognition Arousal/Alertness: Lethargic Behavior During Therapy: WFL for tasks assessed/performed Overall Cognitive Status: No family/caregiver present to determine baseline cognitive functioning                      General Comments      Exercises        Assessment/Plan    PT Assessment Patient needs continued PT services  PT Diagnosis Difficulty walking;Abnormality of gait;Generalized weakness   PT Problem List Decreased  strength;Decreased activity tolerance;Decreased balance;Decreased mobility;Decreased cognition;Decreased knowledge of use of DME;Decreased safety awareness;Decreased knowledge of precautions;Cardiopulmonary status limiting activity  PT Treatment Interventions DME instruction;Functional mobility training;Gait training;Therapeutic activities;Therapeutic exercise;Balance training;Patient/family education   PT Goals (Current goals can be found in the Care Plan section) Acute Rehab PT Goals PT Goal Formulation: Patient unable to participate in goal setting Time For Goal Achievement: 04/29/16 Potential to Achieve Goals: Fair    Frequency Min 3X/week   Barriers to discharge Decreased caregiver support pt states that she does not currently have 24/7 assistance/supervision at home    Co-evaluation               End of Session Equipment Utilized During Treatment: Gait belt;Oxygen Activity Tolerance: Patient tolerated treatment well Patient left: in chair;with chair alarm set;with nursing/sitter in room Nurse Communication: Mobility status    Functional Assessment Tool Used: [employer] AM-PAC "6-clicks"  Functional Limitation: Mobility: Walking and moving around Mobility: Walking and Moving Around Current Status [phone]): At least 40 percent but less than 60 percent impaired, limited or restricted Mobility: Walking and Moving Around Goal Status [phone]): At least 20 percent but less than 40 percent impaired, limited or restricted    Time: 1016-1100 PT Time Calculation (min) (ACUTE ONLY): 44 min   Charges:   PT Evaluation $PT Eval Moderate Complexity: 1 Procedure PT Treatments $Gait Training: 8-22 mins $Therapeutic Activity: 23-37 mins   PT G Codes:   PT G-Codes **NOT FOR INPATIENT CLASS** Functional Assessment Tool Used: [employer] "6-clicks"  Functional Limitation: Mobility: Walking and moving around Mobility: Walking and Moving Around Current Status [phone]):  At least 40 percent but less than 60 percent impaired, limited or restricted Mobility: Walking and Moving Around Goal Status [phone]): At least 20 percent but less than 40 percent impaired, limited or restricted   Beth Trustin Chapa, PT, DPT X: [phone]

## 2016-04-15 NOTE — Progress Notes (Signed)
Patient had a 15 beat run of v-tach,patient was asymptomatic, B/P 107/65,temp 97.9, hr 79. Dr Ardyth HarpsHernandez notified,will continue to monitor patient.

## 2016-04-15 NOTE — Progress Notes (Signed)
Pharmacy Antibiotic Note  Ruth SagesJudy M Davidson is a 75 y.o. female admitted on 04/14/2016 with pneumonia.  Pharmacy has been consulted for renal dose antibiotics dosing. She is currently on azithromycin and rocephin which do not require renal adjustment  Plan: Pharmacy will sign off.  Please advise if we can be of further assistance  Height: 5\' 6"  (167.6 cm) Weight: 91 lb 11.7 oz (41.61 kg) IBW/kg (Calculated) : 59.3  Temp (24hrs), Avg:99.6 F (37.6 C), Min:98.5 F (36.9 C), Max:100.5 F (38.1 C)   Recent Labs Lab 04/14/16 1640 04/14/16 1647 04/14/16 1757 04/15/16 0218  WBC 9.4  --   --  8.5  CREATININE  --   --  1.01*  --   LATICACIDVEN  --  1.27  --   --     Estimated Creatinine Clearance: 32.1 mL/min (by C-G formula based on Cr of 1.01).    No Known Allergies   Thank you for allowing pharmacy to be a part of this patient's care.  Talbert CageSeay, Krosby Ritchie Poteet 04/15/2016 8:05 AM

## 2016-04-15 NOTE — Care Management Important Message (Signed)
Important Message  Patient Details  Name: Ruth Davidson MRN: [id number] Date of Birth: May 27, 1941   Medicare Important Message Given:  Yes    Malcolm MetroChildress, Haider Hornaday Demske, RN 04/15/2016, 3:44 PM

## 2016-04-15 NOTE — Progress Notes (Signed)
*  PRELIMINARY RESULTS* Echocardiogram 2D Echocardiogram has been performed.  Jeryl Columbialliott, Cynithia Hakimi 04/15/2016, 2:53 PM

## 2016-04-15 NOTE — Progress Notes (Signed)
PROGRESS NOTE    Ruth Davidson  RUE:[id number] DOB: 13-Mar-1941 DOA: 04/14/2016 PCP: Cassell Smiles., MD     Brief Narrative:  75 year old woman admitted on 6/13 with complaints of fatigue, dyspnea. She has a history of coronary artery disease as well as systolic CHF. It appears she quit taking her medications. On admission she was found to be febrile and treatment for community-acquired pneumonia was initiated. Subsequent CT scan of the chest shows no evidence for pneumonia in instead shows interstitial edema most consistent with acute CHF. Fever has resolved. Source of fever not clear at this time. Antibiotics will be discontinued on 6/14. She also had a 15 beat run of nonsustained ventricular tachycardia on 6/13.   Assessment & Plan:   Principal Problem:   Acute systolic CHF (congestive heart failure) (HCC) Active Problems:   CAD (coronary artery disease)   Hyperlipidemia   Tobacco abuse   Pneumonia   SIRS due to infectious process without acute organ dysfunction (HCC)   Community acquired pneumonia   NSVT (nonsustained ventricular tachycardia) (HCC)   Acute on chronic systolic CHF -Echo shows an ejection fraction of 20%, cannot rule out mural apical thrombus (question need for TEE). -Continue Lasix at current dose, is 2.5 L negative since admission, is still volume overloaded on exam. -Continue Lopressor, especially given nonsustained VT, hold off on ARB/ACE inhibitor for now due to hypotension. -Cardiology consultation will be requested.  Nonsustained ventricular tachycardia -Likely due to depressed ejection fraction. -Continue Lopressor. -Asymptomatic.  Fever -Source unclear, will discontinue antibiotics for now. Remains afebrile since admission.   DVT prophylaxis: Lovenox Code Status: Full code Family Communication: Patient only Disposition Plan: To be determined  Consultants:   Cardiology  Procedures:   None  Antimicrobials:   None     Subjective: Very sleepy, can only answer questions briefly before falling back asleep, denies chest pain or shortness of breath.  Objective: Filed Vitals:   04/15/16 1020 04/15/16 1101 04/15/16 1439 04/15/16 1525  BP: 105/67 86/51 84/54  107/65  Pulse: 87 73 73 79  Temp:   98.6 F (37 C) 97.9 F (36.6 C)  TempSrc:   Oral Axillary  Resp:   20 20  Height:      Weight:      SpO2: 96%  98% 99%    Intake/Output Summary (Last 24 hours) at 04/15/16 1719 Last data filed at 04/15/16 1534  Gross per 24 hour  Intake    120 ml  Output   2600 ml  Net  -2480 ml   Filed Weights   04/14/16 1531 04/14/16 2031 04/15/16 0620  Weight: 44.906 kg (99 lb) 41.822 kg (92 lb 3.2 oz) 41.61 kg (91 lb 11.7 oz)    Examination:  General exam: Alert, awake, oriented x 3 Respiratory system: Clear to auscultation. Respiratory effort normal. Cardiovascular system:RRR. No murmurs, rubs, gallops. Gastrointestinal system: Abdomen is nondistended, soft and nontender. No organomegaly or masses felt. Normal bowel sounds heard. Central nervous system: Somnolent. No focal neurological deficits. Extremities: 1-2+ pitting edema bilaterally +pedal pulses Skin: No rashes, lesions or ulcers Psychiatry: Unable to assess given current mental state    Data Reviewed: I have personally reviewed following labs and imaging studies  CBC:  Recent Labs Lab 04/14/16 1640 04/15/16 0218  WBC 9.4 8.5  NEUTROABS 7.9* 7.6  HGB 17.4* 15.4*  HCT 51.3* 45.7  MCV 96.6 96.8  PLT 142* 132*   Basic Metabolic Panel:  Recent Labs Lab 04/14/16 1757  NA 139  K 3.9  CL 103  CO2 26  GLUCOSE 97  BUN 14  CREATININE 1.01*  CALCIUM 9.0   GFR: Estimated Creatinine Clearance: 32.1 mL/min (by C-G formula based on Cr of 1.01). Liver Function Tests:  Recent Labs Lab 04/14/16 1757  AST 38  ALT 27  ALKPHOS 99  BILITOT 1.6*  PROT 6.6  ALBUMIN 4.0    Recent Labs Lab 04/14/16 1757  LIPASE 18   No results for  input(s): AMMONIA in the last 168 hours. Coagulation Profile: No results for input(s): INR, PROTIME in the last 168 hours. Cardiac Enzymes:  Recent Labs Lab 04/14/16 1757 04/15/16 0218  TROPONINI 0.08* 0.10*   BNP (last 3 results) No results for input(s): PROBNP in the last 8760 hours. HbA1C: No results for input(s): HGBA1C in the last 72 hours. CBG: No results for input(s): GLUCAP in the last 168 hours. Lipid Profile: No results for input(s): CHOL, HDL, LDLCALC, TRIG, CHOLHDL, LDLDIRECT in the last 72 hours. Thyroid Function Tests: No results for input(s): TSH, T4TOTAL, FREET4, T3FREE, THYROIDAB in the last 72 hours. Anemia Panel: No results for input(s): VITAMINB12, FOLATE, FERRITIN, TIBC, IRON, RETICCTPCT in the last 72 hours. Urine analysis:    Component Value Date/Time   COLORURINE YELLOW 04/14/2016 1605   APPEARANCEUR CLEAR 04/14/2016 1605   LABSPEC 1.025 04/14/2016 1605   PHURINE 5.5 04/14/2016 1605   GLUCOSEU NEGATIVE 04/14/2016 1605   HGBUR SMALL* 04/14/2016 1605   BILIRUBINUR NEGATIVE 04/14/2016 1605   KETONESUR NEGATIVE 04/14/2016 1605   PROTEINUR NEGATIVE 04/14/2016 1605   NITRITE NEGATIVE 04/14/2016 1605   LEUKOCYTESUR SMALL* 04/14/2016 1605   Sepsis Labs: @LABRCNTIP (procalcitonin:4,lacticidven:4)  ) Recent Results (from the past 240 hour(s))  Culture, blood (routine x 2) Call MD if unable to obtain prior to antibiotics being given     Status: None (Preliminary result)   Collection Time: 04/14/16  4:40 PM  Result Value Ref Range Status   Specimen Description BLOOD RIGHT FOREARM  Final   Special Requests BOTTLES DRAWN AEROBIC AND ANAEROBIC 6CC  Final   Culture NO GROWTH < 24 HOURS  Final   Report Status PENDING  Incomplete  Culture, blood (routine x 2) Call MD if unable to obtain prior to antibiotics being given     Status: None (Preliminary result)   Collection Time: 04/14/16  5:57 PM  Result Value Ref Range Status   Specimen Description BLOOD LEFT  HAND  Final   Special Requests BOTTLES DRAWN AEROBIC AND ANAEROBIC 6CC  Final   Culture NO GROWTH < 24 HOURS  Final   Report Status PENDING  Incomplete         Radiology Studies: Dg Chest 2 View  04/14/2016  CLINICAL DATA:  Shortness of breath, fever and weakness. EXAM: CHEST  2 VIEW COMPARISON:  PA and lateral chest 08/11/2004. Single view of the chest 11/10/2015. FINDINGS: The lungs are markedly emphysematous. There are small bilateral pleural effusions with basilar airspace disease, worse on the right. Heart size is enlarged with mild interstitial edema seen. No pneumothorax. IMPRESSION: Cardiomegaly and mild interstitial edema. Small bilateral pleural effusions and basilar airspace disease, greater on the right. Airspace disease could be due to atelectasis or pneumonia. Electronically Signed   By: Drusilla Kanner M.D.   On: 04/14/2016 17:22   Ct Angio Chest Pe W/cm &/or Wo Cm  04/14/2016  CLINICAL DATA:  Three day history of fatigue, lanced, dyspnea, nausea, and vomiting. Shortness of breath. Increased D-dimer. EXAM: CT ANGIOGRAPHY CHEST WITH CONTRAST TECHNIQUE: Multidetector CT imaging  of the chest was performed using the standard protocol during bolus administration of intravenous contrast. Multiplanar CT image reconstructions and MIPs were obtained to evaluate the vascular anatomy. CONTRAST:  80 mL Isovue 370 COMPARISON:  None. FINDINGS: Technically adequate study with good opacification of the central and segmental pulmonary arteries. No focal filling defects. No evidence of significant pulmonary embolus. Diffuse cardiac enlargement with particular left ventricular and left atrial dilatation. Normal caliber thoracic aorta. No aortic dissection. Great vessel origins are patent. Esophagus is decompressed. No significant lymphadenopathy in the chest. Heterogeneous enlargement of the right lobe of the thyroid gland. Moderate size bilateral pleural effusions. Diffuse interstitial and airspace  infiltrates in the lungs. Appearance is nonspecific but likely to represent edema. Emphysematous changes in the upper lungs. Airways thickening. No pneumothorax. Included portions of the upper abdominal organs are grossly unremarkable. Degenerative changes in the spine. Review of the MIP images confirms the above findings. IMPRESSION: No evidence of significant pulmonary embolus. Cardiac enlargement. Bilateral pleural effusions. Diffuse airspace and interstitial disease, likely edema. Changes probably indicate congestive failure. Electronically Signed   By: Burman NievesWilliam  Stevens M.D.   On: 04/14/2016 23:58        Scheduled Meds: . aspirin  81 mg Oral Daily  . enoxaparin (LOVENOX) injection  30 mg Subcutaneous Q24H  . furosemide  40 mg Intravenous Q12H  . metoprolol tartrate  12.5 mg Oral BID  . simvastatin  40 mg Oral q1800   Continuous Infusions:    LOS: 1 day    Time spent: 25 minutes. Greater than 50% of this time was spent in direct contact with the patient coordinating care.     Chaya JanHERNANDEZ ACOSTA,ESTELA, MD [facility] Pager [phone]  If 7PM-7AM, please contact night-coverage [email or web address] Password [email or web address] 04/15/2016, 5:19 PM

## 2016-04-16 ENCOUNTER — Encounter (HOSPITAL_COMMUNITY): Payer: Self-pay | Admitting: Adult Health

## 2016-04-16 DIAGNOSIS — I5021 Acute systolic (congestive) heart failure: Secondary | ICD-10-CM

## 2016-04-16 LAB — BASIC METABOLIC PANEL
Anion gap: 8 (ref 5–15)
BUN: 27 mg/dL — ABNORMAL HIGH (ref 6–20)
CALCIUM: 8.5 mg/dL — AB (ref 8.9–10.3)
CHLORIDE: 98 mmol/L — AB (ref 101–111)
CO2: 33 mmol/L — AB (ref 22–32)
CREATININE: 1.01 mg/dL — AB (ref 0.44–1.00)
GFR calc Af Amer: >60 mL/min (ref >60)
GFR calc non Af Amer: 53 mL/min — ABNORMAL LOW (ref >60)
GLUCOSE: 101 mg/dL — AB (ref 65–99)
Potassium: 3.3 mmol/L — ABNORMAL LOW (ref 3.5–5.1)
Sodium: 139 mmol/L (ref 135–145)

## 2016-04-16 LAB — PROCALCITONIN: PROCALCITONIN: 0.28 ng/mL

## 2016-04-16 LAB — LEGIONELLA PNEUMOPHILA SEROGP 1 UR AG: L. pneumophila Serogp 1 Ur Ag: NEGATIVE

## 2016-04-16 LAB — CBC
HCT: 47.7 % — ABNORMAL HIGH (ref 36.0–46.0)
Hemoglobin: 15.5 g/dL — ABNORMAL HIGH (ref 12.0–15.0)
MCH: 31.6 pg (ref 26.0–34.0)
MCHC: 32.5 g/dL (ref 30.0–36.0)
MCV: 97.1 fL (ref 78.0–100.0)
PLATELETS: 126 10*3/uL — AB (ref 150–400)
RBC: 4.91 MIL/uL (ref 3.87–5.11)
RDW: 13.8 % (ref 11.5–15.5)
WBC: 7.6 10*3/uL (ref 4.0–10.5)

## 2016-04-16 MED ORDER — FUROSEMIDE 40 MG PO TABS
40.0000 mg | ORAL_TABLET | Freq: Every day | ORAL | Status: DC
  Administered 2016-04-16 – 2016-04-18 (×3): 40 mg via ORAL
  Filled 2016-04-16 (×3): qty 1

## 2016-04-16 MED ORDER — CARVEDILOL 3.125 MG PO TABS
3.1250 mg | ORAL_TABLET | Freq: Two times a day (BID) | ORAL | Status: DC
  Administered 2016-04-16 – 2016-04-18 (×5): 3.125 mg via ORAL
  Filled 2016-04-16 (×5): qty 1

## 2016-04-16 MED ORDER — LEVALBUTEROL HCL 0.63 MG/3ML IN NEBU
INHALATION_SOLUTION | RESPIRATORY_TRACT | Status: AC
  Administered 2016-04-16: 0.63 mg
  Filled 2016-04-16: qty 3

## 2016-04-16 NOTE — Consult Note (Signed)
CARDIOLOGY CONSULT NOTE   Patient ID: Ruth Davidson MRN: [id number] DOB/AGE: 1941/05/24 75 y.o.  Admit Date: 04/14/2016 Referring Physician: TRH-Hernandez Primary Physician: Cassell Smiles., MD Consulting Cardiologist: Charlton Haws Primary Cardiologist Prentice Docker Reason for Consultation: Systolic CHF, with known history of CAD  Clinical Summary Ruth Davidson is a 75 y.o.female with known history of CAD with inferior wall MI in 2003, BMS, anterior wall MI treated medically, hypertension, hyperlipidemia, and ongoing tobacco abuse. Who presented to the ER with complaints of weakness, CXR completed demonstrated pleural effusions and bibasilar airspace disease greater on the right. She was treated with antibiotics, and DuoNeb treatments. D-Dimer was found to be elevated with follow up CT negative for PE with changes indicative of CHF. There was diffuse cardiac enlargement with particular left ventricular and left atrial dilation.   Echocardiogram was completed on 6/14//2017 with results revealing significant systolic dysfunction with EF of 20%, could not rule out mural apical thrombus, less likely on Definity images. Compared to last echo documentation on 01/14/2009, EF was found to be normal at 50-55% without WMA.  On telemetry yesterday, she had NSVT resolving on its own.  We are asked for cardiology recommendations.   She has been placed on IV lasix 40 mg BID, with 2.3 liters diuresed. BP is soft at 88/47 with HR control at 72 bpm. Patient is a poor historian. Hx is obtained from current and PMH.Tried to call her son, [name] but no answer.   No Known Allergies  Medications Scheduled Medications: . aspirin  81 mg Oral Daily  . enoxaparin (LOVENOX) injection  30 mg Subcutaneous Q24H  . furosemide  40 mg Intravenous Q12H  . metoprolol tartrate  12.5 mg Oral BID  . simvastatin  40 mg Oral q1800    Infusions:    PRN Medications: guaiFENesin-dextromethorphan,  ipratropium, levalbuterol, LORazepam, nitroGLYCERIN   Past Medical History  Diagnosis Date  . MI (myocardial infarction) (HCC)     History reviewed. No pertinent past surgical history.  Family History  Problem Relation Age of Onset  . Hypertension Father     Died in war    Social History Ruth Davidson reports that she has been smoking Cigarettes.  She started smoking about 57 years ago. She has a 12.5 pack-year smoking history. She has never used smokeless tobacco. Ruth Davidson has no alcohol history on file.  Review of Systems Complete review of systems are found to be negative unless outlined in H&P above.  Physical Examination Blood pressure 88/47, pulse 74, temperature 98.2 F (36.8 C), temperature source Oral, resp. rate 19, height 5\' 6"  (1.676 m), weight 90 lb 1.6 oz (40.869 kg), SpO2 90 %.  Intake/Output Summary (Last 24 hours) at 04/16/16 0825 Last data filed at 04/15/16 2119  Gross per 24 hour  Intake    360 ml  Output    700 ml  Net   -340 ml    Telemetry: NSR with episode of NSVT yesterday around 3 pm.   GEN: Sleepy and lethargic. Difficult to keep awake  HEENT: Conjunctiva and lids normal, oropharynx clear with moist mucosa. Neck: Supple, no elevated JVP or carotid bruits, no thyromegaly. Lungs: Inspiratory and expiratory wheezes. No coughing CV: RRR, distant heart sounds, no systolic murmur, no pericardial rub. Abdomen: Soft, nontender, no hepatomegaly, bowel sounds present, no guarding or rebound. Extremities: No pitting edema, distal pulses 2+. Skin: Warm and dry. Musculoskeletal: No kyphosis. Neuropsychiatric: Alert and oriented x3, affect grossly appropriate.  Prior Cardiac Testing/Procedures Echocardiogram  04/15/2016 Left ventricle: cannot r/o mural apical thrombus.although less  likely on definity images The cavity size was severely dilated.  Wall thickness was normal. The estimated ejection fraction was  20%. Diffuse hypokinesis. Doppler  parameters are consistent with  both elevated ventricular end-diastolic filling pressure and  elevated left atrial filling pressure. - Mitral valve: There was mild regurgitation. - Atrial septum: No defect or patent foramen ovale was identified.  Lab Results  Basic Metabolic Panel:  Recent Labs Lab 04/14/16 1757 04/16/16 0530  NA 139 139  K 3.9 3.3*  CL 103 98*  CO2 26 33*  GLUCOSE 97 101*  BUN 14 27*  CREATININE 1.01* 1.01*  CALCIUM 9.0 8.5*    Liver Function Tests:  Recent Labs Lab 04/14/16 1757  AST 38  ALT 27  ALKPHOS 99  BILITOT 1.6*  PROT 6.6  ALBUMIN 4.0    CBC:  Recent Labs Lab 04/14/16 1640 04/15/16 0218 04/16/16 0530  WBC 9.4 8.5 7.6  NEUTROABS 7.9* 7.6  --   HGB 17.4* 15.4* 15.5*  HCT 51.3* 45.7 47.7*  MCV 96.6 96.8 97.1  PLT 142* 132* 126*    Cardiac Enzymes:  Recent Labs Lab 04/14/16 1757 04/15/16 0218  TROPONINI 0.08* 0.10*     Radiology: Dg Chest 2 View  04/14/2016  CLINICAL DATA:  Shortness of breath, fever and weakness. EXAM: CHEST  2 VIEW COMPARISON:  PA and lateral chest 08/11/2004. Single view of the chest 11/10/2015. FINDINGS: The lungs are markedly emphysematous. There are small bilateral pleural effusions with basilar airspace disease, worse on the right. Heart size is enlarged with mild interstitial edema seen. No pneumothorax. IMPRESSION: Cardiomegaly and mild interstitial edema. Small bilateral pleural effusions and basilar airspace disease, greater on the right. Airspace disease could be due to atelectasis or pneumonia. Electronically Signed   By: Drusilla Kannerhomas  Dalessio M.D.   On: 04/14/2016 17:22   Ct Angio Chest Pe W/cm &/or Wo Cm  04/14/2016  CLINICAL DATA:  Three day history of fatigue, lanced, dyspnea, nausea, and vomiting. Shortness of breath. Increased D-dimer. EXAM: CT ANGIOGRAPHY CHEST WITH CONTRAST TECHNIQUE: Multidetector CT imaging of the chest was performed using the standard protocol during bolus administration of  intravenous contrast. Multiplanar CT image reconstructions and MIPs were obtained to evaluate the vascular anatomy. CONTRAST:  80 mL Isovue 370 COMPARISON:  None. FINDINGS: Technically adequate study with good opacification of the central and segmental pulmonary arteries. No focal filling defects. No evidence of significant pulmonary embolus. Diffuse cardiac enlargement with particular left ventricular and left atrial dilatation. Normal caliber thoracic aorta. No aortic dissection. Great vessel origins are patent. Esophagus is decompressed. No significant lymphadenopathy in the chest. Heterogeneous enlargement of the right lobe of the thyroid gland. Moderate size bilateral pleural effusions. Diffuse interstitial and airspace infiltrates in the lungs. Appearance is nonspecific but likely to represent edema. Emphysematous changes in the upper lungs. Airways thickening. No pneumothorax. Included portions of the upper abdominal organs are grossly unremarkable. Degenerative changes in the spine. Review of the MIP images confirms the above findings. IMPRESSION: No evidence of significant pulmonary embolus. Cardiac enlargement. Bilateral pleural effusions. Diffuse airspace and interstitial disease, likely edema. Changes probably indicate congestive failure. Electronically Signed   By: Burman NievesWilliam  Stevens M.D.   On: 04/14/2016 23:58     ECG: Sinus tachycardia with T-wave inversion and LVH with inferior Q-waves.   Impression and Recommendations  1. Acute Systolic CHF with significant reduction in systolic function to 20% from echo in 2010.: She as  been diuresed and is without complaints. Remains on O2 2 liters. Very sleepy and hard to keep aroused. Denies chest pain or dyspnea. Inspiratory and expiratory wheezes in the upper airways. Will d/c metoprolol and begin coreg 3.125 mg BID. BP is soft and preferred with reduced EF. Will change lasix to po. May be a candidate for Entresto. Creatinine 1.01.   2. CAD: Hx of BMS  to RCA in 2006, with distal LAD disease being treated medically. Will reduced EF, she is a candidate for cardiac cath. Cannot keep her awake enough to discuss with her. Tried to call son at number listed but no answer. Discussed with Dr. Eden Emms. Cath is not emergent and can wait to do as OP. Optimized medications. Continue ASA, statin. Wait to start ACE or Entresto due to soft BP. Troponin 0.08 and 0.10 respectively.   3. Ongoing tobacco abuse; Smoking cessation is recommended.     Signed: Bettey Mare. Lawrence NP [facility]  04/16/2016, 8:25 AM Co-Sign MD  Patient examined chart reviewed. Frail female lethargic this am. CAD distant with new onset CHF  And decreased EF by Echo .  Basilar rales on exam. Given extent of reduced EF favor right and left Heart cath. Currently needs further w/u lethargy and MS.  Agree with oral lasix.  She is stable and  Cath can  Be arranged electively as outpatient if needed as weekend is approaching.  Charlton Haws

## 2016-04-16 NOTE — Progress Notes (Signed)
PROGRESS NOTE    Ruth Davidson  ZOX:[id number] DOB: 30-Jul-1941 DOA: 04/14/2016 PCP: Cassell Smiles., MD     Brief Narrative:  75 year old woman admitted on 6/13 with complaints of fatigue, dyspnea. She has a history of coronary artery disease as well as systolic CHF. It appears she quit taking her medications. On admission she was found to be febrile and treatment for community-acquired pneumonia was initiated. Subsequent CT scan of the chest shows no evidence for pneumonia in instead shows interstitial edema most consistent with acute CHF. Fever has resolved. Source of fever not clear at this time. Antibiotics will be discontinued on 6/14. She also had a 15 beat run of nonsustained ventricular tachycardia on 6/13.   Assessment & Plan:   Principal Problem:   Acute systolic CHF (congestive heart failure) (HCC) Active Problems:   CAD (coronary artery disease)   Hyperlipidemia   Tobacco abuse   Pneumonia   SIRS due to infectious process without acute organ dysfunction (HCC)   Community acquired pneumonia   NSVT (nonsustained ventricular tachycardia) (HCC)   Acute on chronic systolic CHF -Echo shows an ejection fraction of 20%, cannot rule out mural apical thrombus (question need for TEE). - is 2.3 L negative since admission, cardiology has transitioned Lasix over to by mouth. -Continue Lopressor, especially given nonsustained VT, hold off on ARB/ACE inhibitor for now due to hypotension. -Cardiology consultation requested, plans for heart catheterization being entertained.  Nonsustained ventricular tachycardia -Likely due to depressed ejection fraction. -Continue Lopressor. -Asymptomatic.  Fever -Source unclear, will discontinue antibiotics for now. Remains afebrile since admission.  Acute encephalopathy -Patient has been somnolent, etiology remains unclear. -She is more alert this afternoon and granddaughter says she is close to what her baseline has been over the past 6  weeks. She has been much more fatigued and lethargic in the past month and a half.   DVT prophylaxis: Lovenox Code Status: Full code Family Communication: Granddaughter at bedside, daughter via phone Disposition Plan: To be determined  Consultants:   Cardiology  Procedures:   None  Antimicrobials:   None    Subjective: More alert, does not engage in conversation.  Objective: Filed Vitals:   04/16/16 0516 04/16/16 0800 04/16/16 1000 04/16/16 1555  BP: 88/47 96/49  90/57  Pulse: 74 77  77  Temp: 98.2 F (36.8 C) 97.9 F (36.6 C)  98.5 F (36.9 C)  TempSrc: Oral Axillary  Oral  Resp: 19 24  20   Height:      Weight: 40.869 kg (90 lb 1.6 oz)     SpO2: 90% 89% 93% 97%    Intake/Output Summary (Last 24 hours) at 04/16/16 1702 Last data filed at 04/15/16 2119  Gross per 24 hour  Intake    240 ml  Output    100 ml  Net    140 ml   Filed Weights   04/14/16 2031 04/15/16 0620 04/16/16 0516  Weight: 41.822 kg (92 lb 3.2 oz) 41.61 kg (91 lb 11.7 oz) 40.869 kg (90 lb 1.6 oz)    Examination:  General exam: Alert, awake, oriented x 3 Respiratory system: Clear to auscultation. Respiratory effort normal. Cardiovascular system:RRR. No murmurs, rubs, gallops. Gastrointestinal system: Abdomen is nondistended, soft and nontender. No organomegaly or masses felt. Normal bowel sounds heard. Central nervous system: Somnolent. No focal neurological deficits. Extremities: Trace bilateral pitting edema bilaterally +pedal pulses Skin: No rashes, lesions or ulcers Psychiatry: Unable to assess given current mental state    Data Reviewed: I  have personally reviewed following labs and imaging studies  CBC:  Recent Labs Lab 04/14/16 1640 04/15/16 0218 04/16/16 0530  WBC 9.4 8.5 7.6  NEUTROABS 7.9* 7.6  --   HGB 17.4* 15.4* 15.5*  HCT 51.3* 45.7 47.7*  MCV 96.6 96.8 97.1  PLT 142* 132* 126*   Basic Metabolic Panel:  Recent Labs Lab 04/14/16 1757 04/16/16 0530  NA  139 139  K 3.9 3.3*  CL 103 98*  CO2 26 33*  GLUCOSE 97 101*  BUN 14 27*  CREATININE 1.01* 1.01*  CALCIUM 9.0 8.5*   GFR: Estimated Creatinine Clearance: 31.6 mL/min (by C-G formula based on Cr of 1.01). Liver Function Tests:  Recent Labs Lab 04/14/16 1757  AST 38  ALT 27  ALKPHOS 99  BILITOT 1.6*  PROT 6.6  ALBUMIN 4.0    Recent Labs Lab 04/14/16 1757  LIPASE 18   No results for input(s): AMMONIA in the last 168 hours. Coagulation Profile: No results for input(s): INR, PROTIME in the last 168 hours. Cardiac Enzymes:  Recent Labs Lab 04/14/16 1757 04/15/16 0218  TROPONINI 0.08* 0.10*   BNP (last 3 results) No results for input(s): PROBNP in the last 8760 hours. HbA1C: No results for input(s): HGBA1C in the last 72 hours. CBG: No results for input(s): GLUCAP in the last 168 hours. Lipid Profile: No results for input(s): CHOL, HDL, LDLCALC, TRIG, CHOLHDL, LDLDIRECT in the last 72 hours. Thyroid Function Tests: No results for input(s): TSH, T4TOTAL, FREET4, T3FREE, THYROIDAB in the last 72 hours. Anemia Panel: No results for input(s): VITAMINB12, FOLATE, FERRITIN, TIBC, IRON, RETICCTPCT in the last 72 hours. Urine analysis:    Component Value Date/Time   COLORURINE YELLOW 04/14/2016 1605   APPEARANCEUR CLEAR 04/14/2016 1605   LABSPEC 1.025 04/14/2016 1605   PHURINE 5.5 04/14/2016 1605   GLUCOSEU NEGATIVE 04/14/2016 1605   HGBUR SMALL* 04/14/2016 1605   BILIRUBINUR NEGATIVE 04/14/2016 1605   KETONESUR NEGATIVE 04/14/2016 1605   PROTEINUR NEGATIVE 04/14/2016 1605   NITRITE NEGATIVE 04/14/2016 1605   LEUKOCYTESUR SMALL* 04/14/2016 1605   Sepsis Labs: @LABRCNTIP (procalcitonin:4,lacticidven:4)  ) Recent Results (from the past 240 hour(s))  Culture, blood (routine x 2) Call MD if unable to obtain prior to antibiotics being given     Status: None (Preliminary result)   Collection Time: 04/14/16  4:40 PM  Result Value Ref Range Status   Specimen  Description BLOOD RIGHT FOREARM DRAWN BY RN  Final   Special Requests BOTTLES DRAWN AEROBIC AND ANAEROBIC 6CC  Final   Culture NO GROWTH 2 DAYS  Final   Report Status PENDING  Incomplete  Culture, blood (routine x 2) Call MD if unable to obtain prior to antibiotics being given     Status: None (Preliminary result)   Collection Time: 04/14/16  5:57 PM  Result Value Ref Range Status   Specimen Description BLOOD LEFT HAND  Final   Special Requests BOTTLES DRAWN AEROBIC AND ANAEROBIC 6CC  Final   Culture NO GROWTH 2 DAYS  Final   Report Status PENDING  Incomplete         Radiology Studies: Dg Chest 2 View  04/14/2016  CLINICAL DATA:  Shortness of breath, fever and weakness. EXAM: CHEST  2 VIEW COMPARISON:  PA and lateral chest 08/11/2004. Single view of the chest 11/10/2015. FINDINGS: The lungs are markedly emphysematous. There are small bilateral pleural effusions with basilar airspace disease, worse on the right. Heart size is enlarged with mild interstitial edema seen. No pneumothorax. IMPRESSION:  Cardiomegaly and mild interstitial edema. Small bilateral pleural effusions and basilar airspace disease, greater on the right. Airspace disease could be due to atelectasis or pneumonia. Electronically Signed   By: Drusilla Kanner M.D.   On: 04/14/2016 17:22   Ct Angio Chest Pe W/cm &/or Wo Cm  04/14/2016  CLINICAL DATA:  Three day history of fatigue, lanced, dyspnea, nausea, and vomiting. Shortness of breath. Increased D-dimer. EXAM: CT ANGIOGRAPHY CHEST WITH CONTRAST TECHNIQUE: Multidetector CT imaging of the chest was performed using the standard protocol during bolus administration of intravenous contrast. Multiplanar CT image reconstructions and MIPs were obtained to evaluate the vascular anatomy. CONTRAST:  80 mL Isovue 370 COMPARISON:  None. FINDINGS: Technically adequate study with good opacification of the central and segmental pulmonary arteries. No focal filling defects. No evidence of  significant pulmonary embolus. Diffuse cardiac enlargement with particular left ventricular and left atrial dilatation. Normal caliber thoracic aorta. No aortic dissection. Great vessel origins are patent. Esophagus is decompressed. No significant lymphadenopathy in the chest. Heterogeneous enlargement of the right lobe of the thyroid gland. Moderate size bilateral pleural effusions. Diffuse interstitial and airspace infiltrates in the lungs. Appearance is nonspecific but likely to represent edema. Emphysematous changes in the upper lungs. Airways thickening. No pneumothorax. Included portions of the upper abdominal organs are grossly unremarkable. Degenerative changes in the spine. Review of the MIP images confirms the above findings. IMPRESSION: No evidence of significant pulmonary embolus. Cardiac enlargement. Bilateral pleural effusions. Diffuse airspace and interstitial disease, likely edema. Changes probably indicate congestive failure. Electronically Signed   By: Burman Nieves M.D.   On: 04/14/2016 23:58        Scheduled Meds: . aspirin  81 mg Oral Daily  . carvedilol  3.125 mg Oral BID WC  . enoxaparin (LOVENOX) injection  30 mg Subcutaneous Q24H  . furosemide  40 mg Oral Daily  . simvastatin  40 mg Oral q1800   Continuous Infusions:    LOS: 2 days    Time spent: 25 minutes. Greater than 50% of this time was spent in direct contact with the patient coordinating care.     Chaya Jan, MD [facility] Pager [phone]  If 7PM-7AM, please contact night-coverage [email or web address] Password [facility] 04/16/2016, 5:02 PM

## 2016-04-17 DIAGNOSIS — I471 Supraventricular tachycardia: Secondary | ICD-10-CM

## 2016-04-17 DIAGNOSIS — I472 Ventricular tachycardia: Secondary | ICD-10-CM

## 2016-04-17 DIAGNOSIS — I25119 Atherosclerotic heart disease of native coronary artery with unspecified angina pectoris: Secondary | ICD-10-CM

## 2016-04-17 DIAGNOSIS — I429 Cardiomyopathy, unspecified: Secondary | ICD-10-CM

## 2016-04-17 LAB — BASIC METABOLIC PANEL
ANION GAP: 8 (ref 5–15)
BUN: 26 mg/dL — ABNORMAL HIGH (ref 6–20)
CALCIUM: 8.1 mg/dL — AB (ref 8.9–10.3)
CO2: 32 mmol/L (ref 22–32)
CREATININE: 0.84 mg/dL (ref 0.44–1.00)
Chloride: 100 mmol/L — ABNORMAL LOW (ref 101–111)
Glucose, Bld: 86 mg/dL (ref 65–99)
Potassium: 3.2 mmol/L — ABNORMAL LOW (ref 3.5–5.1)
SODIUM: 140 mmol/L (ref 135–145)

## 2016-04-17 LAB — BLOOD GAS, ARTERIAL
ACID-BASE EXCESS: 9.4 mmol/L — AB (ref 0.0–2.0)
BICARBONATE: 31.7 meq/L — AB (ref 20.0–24.0)
Drawn by: 221791
O2 CONTENT: 2.5 L/min
O2 Saturation: 96.6 %
PCO2 ART: 54.6 mmHg — AB (ref 35.0–45.0)
PO2 ART: 87.6 mmHg (ref 80.0–100.0)
Patient temperature: 37.7
TCO2: 21.3 mmol/L (ref 0–100)
pH, Arterial: 7.414 (ref 7.350–7.450)

## 2016-04-17 MED ORDER — ENSURE ENLIVE PO LIQD
237.0000 mL | Freq: Three times a day (TID) | ORAL | Status: DC
  Administered 2016-04-17 – 2016-04-18 (×2): 237 mL via ORAL

## 2016-04-17 MED ORDER — POTASSIUM CHLORIDE CRYS ER 20 MEQ PO TBCR
20.0000 meq | EXTENDED_RELEASE_TABLET | Freq: Every day | ORAL | Status: DC
  Administered 2016-04-18: 20 meq via ORAL
  Filled 2016-04-17 (×2): qty 1

## 2016-04-17 MED ORDER — POTASSIUM CHLORIDE CRYS ER 20 MEQ PO TBCR
40.0000 meq | EXTENDED_RELEASE_TABLET | Freq: Once | ORAL | Status: AC
  Administered 2016-04-17: 40 meq via ORAL
  Filled 2016-04-17: qty 2

## 2016-04-17 NOTE — Progress Notes (Signed)
Physical Therapy Treatment Patient Details Name: Ruth SagesJudy M Davidson MRN: [id number] DOB: Aug 06, 1941 Today's Date: 04/17/2016    History of Present Illness 75 yo F admitted with fatigue, malaise, dyspnea, N/V, and fever. CXR: atelectasis or PNA. CTA of the chest showed no PE, but had cardiac enlargement, B pleural effusions, diffuse airspace and interstitial disease - likely edema. Changes probably indicate congestive failure. PMH: CAD with stent, ongoing tobacco abuse, MI    PT Comments    Pt received up in the bathroom with family.  Son & dtr both present during tx.  Son states that he will be living with her 24/7.  Pt is agreeable to PT tx.  Pt ambulated 16700ft with no DME, however, required ~4 standing rest breaks leaning against the wall.  Pt was able to maintain SpO2 saturation between 92%-98% on RA during today's tx.  Pt left sitting up in the room on RA, and RN notified.  Pt is recommended for HHPT, however pt seems hesitant towards it.  Expressed importance to regain strength and functional mobility.    Follow Up Recommendations  Home health PT;Supervision/Assistance - 24 hour     Equipment Recommendations  Rolling walker with 5" wheels (Recommended for long distances and community ambulation. )    Recommendations for Other Services       Precautions / Restrictions Precautions Precautions: Fall Restrictions Weight Bearing Restrictions: No    Mobility  Bed Mobility Overal bed mobility: Modified Independent                Transfers Overall transfer level: Needs assistance Equipment used: Rolling walker (2 wheeled) Transfers: Sit to/from Stand Sit to Stand: Min assist            Ambulation/Gait Ambulation/Gait assistance: Min assist Ambulation Distance (Feet): 100 Feet Assistive device: None Gait Pattern/deviations: Step-to pattern;Trunk flexed;Shuffle     General Gait Details: Pt requiired multiple standing rest breaks (~4), leaning against the wall.      Stairs            Wheelchair Mobility    Modified Rankin (Stroke Patients Only)       Balance Overall balance assessment: Needs assistance           Standing balance-Leahy Scale: Fair                      Cognition Arousal/Alertness: Awake/alert Behavior During Therapy: WFL for tasks assessed/performed Overall Cognitive Status: Within Functional Limits for tasks assessed                      Exercises      General Comments        Pertinent Vitals/Pain Pain Assessment: No/denies pain    Home Living         Home Access: Stairs to enter            Prior Function            PT Goals (current goals can now be found in the care plan section) Acute Rehab PT Goals PT Goal Formulation: Patient unable to participate in goal setting Time For Goal Achievement: 04/29/16 Potential to Achieve Goals: Fair Progress towards PT goals: Progressing toward goals    Frequency  Min 3X/week    PT Plan Current plan remains appropriate    Co-evaluation             End of Session Equipment Utilized During Treatment: Gait belt;Oxygen Activity Tolerance: Patient limited by  fatigue Patient left: in bed;with call bell/phone within reach;with family/visitor present     Time: 1357-1420 PT Time Calculation (min) (ACUTE ONLY): 23 min  Charges:  $Gait Training: 8-22 mins $Therapeutic Activity: 23-37 mins                    G Codes:     Beth Moneka Mcquinn, PT, DPT X: [phone]

## 2016-04-17 NOTE — Clinical Social Work Note (Signed)
Clinical Social Work Assessment  Patient Details  Name: Ruth Davidson MRN: [id number] Date of Birth: [birth year]  Date of referral:  04/17/16               Reason for consult:  Discharge Planning                Permission sought to share information with:  Family Supports Permission granted to share information::     Name::     Surveyor, quantity::     Relationship::  son  Contact Information:     Housing/Transportation Living arrangements for the past 2 months:  Single Family Home Source of Information:  Patient, Adult Children Patient Interpreter Needed:  None Criminal Activity/Legal Involvement Pertinent to Current Situation/Hospitalization:  No - Comment as needed Significant Relationships:  Adult Children Lives with:  Adult Children Do you feel safe going back to the place where you live?  Yes Need for family participation in patient care:  Yes (Comment)  Care giving concerns:  Pt will require additional care upon d/c.    Social Worker assessment / plan:  CSW met with pt at bedside briefly. She indicates she was feeling poorly this morning and requested that CSW talk to her son, Ruth Davidson. Pt aware CSW to discuss d/c planning and states, "Tell him I want to go home." Per Ruth Davidson, they have been working on plan for pt to live with him in anticipation of needing supervision and additional support as she ages. At baseline, pt is independent, but since she has been sick she is requiring much more assist with ADLs. CSW discussed PT evaluation recommending SNF. He completely refuses and has no plans of pt going anywhere but home at d/c. Pt's son states that someone will be with pt around the clock and their only request is for a hospital bed. CM notified.   Employment status:  Retired Nurse, adult PT Recommendations:  [place] / Referral to community resources:  Other (Comment Required) (CM for equipment/home health  needs)  Patient/Family's Response to care:  Family refuses SNF at this time.   Patient/Family's Understanding of and Emotional Response to Diagnosis, Current Treatment, and Prognosis:  [name].   Emotional Assessment Appearance:  Appears stated age Attitude/Demeanor/Rapport:  Unable to Assess Affect (typically observed):  Unable to Assess Orientation:  Oriented to Self, Oriented to Place Alcohol / Substance use:  Not Applicable Psych involvement (Current and /or in the community):  No (Comment)  Discharge Needs  Concerns to be addressed:  Discharge Planning Concerns Readmission within the last 30 days:  No Current discharge risk:  Physical Impairment Barriers to Discharge:  Continued Medical Work up   Ruth Davidson, [place] 04/17/2016, 11:25 AM [phone]

## 2016-04-17 NOTE — Care Management Note (Signed)
Case Management Note  Patient Details  Name: Ruth Davidson MRN: [id number] Date of Birth: February 28, 1941  Expected Discharge Date:  04/17/16               Expected Discharge Plan:  Home w Home Health Services  In-House Referral:  NA  Discharge planning Services  CM Consult  Post Acute Care Choice:  Home Health Choice offered to:  Patient  DME Arranged:    DME Agency:     HH Arranged:    HH Agency:     Status of Service:  In process, will continue to follow  Medicare Important Message Given:  Yes Date Medicare IM Given:    Medicare IM give by:    Date Additional Medicare IM Given:    Additional Medicare Important Message give by:     If discussed at Long Length of Stay Meetings, dates discussed:    Additional Comments: Went into pt room to discuss DC plan, including DME (oxygen and hospital bed) and [facility]. Pt very upset she is not discharging today. Pt does not want hospital bed and says that having oxygen tanks in her home scares her. Pt does not want to discuss providers or needs a this time. Will need to re-address prior to DC.   Malcolm Metrohildress, [place] Selke Demske, RN 04/17/2016, 3:26 PM

## 2016-04-17 NOTE — Progress Notes (Signed)
PROGRESS NOTE    Ruth Davidson:[id number]:[id number] DOB: 10/31/1941 DOA: 04/14/2016 PCP: Ruth Davidson     Brief Narrative:  75 year old woman admitted on 6/13 with complaints of fatigue, dyspnea. She has a history of coronary artery disease as well as systolic CHF. It appears she quit taking her medications. On admission she was found to be febrile and treatment for community-acquired pneumonia was initiated. Subsequent CT scan of the chest shows no evidence for pneumonia in instead shows interstitial edema most consistent with acute CHF. Fever has resolved. Source of fever not clear at this time. Antibiotics will be discontinued on 6/14. She also had a 15 beat run of nonsustained ventricular tachycardia on 6/13.   Assessment & Plan:   Principal Problem:   Acute systolic CHF (congestive heart failure) (HCC) Active Problems:   CAD (coronary artery disease)   Hyperlipidemia   Tobacco abuse   Pneumonia   SIRS due to infectious process without acute organ dysfunction (HCC)   Community acquired pneumonia   NSVT (nonsustained ventricular tachycardia) (HCC)   Acute on chronic systolic CHF -Echo shows an ejection fraction of 20%. - Diuresis per cardiology , has transitioned Lasix over to by mouth, volume status improved. 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 - Cussed with cardiology regarding questionable LV mural thrombus, actor Ruth Davidson reviewed the echo, there is no definitive apical LV mural thrombus with Definity contrast, muscle trabeculation noted, unlikely patient having LV mural thrombus.  Nonsustained ventricular tachycardia -Likely due to depressed  ejection fraction. -Continue Lopressor. -Asymptomatic.  Fever -Source unclear, off antibiotics for now. Remains afebrile since admission.  Acute encephalopathy -Patient has been somnolent, etiology remains unclear. -She is more alert this afternoon and granddaughter says she is close to what her baseline has been over the past 6 weeks. She has been much more fatigued and lethargic in the past month and a half.  Protein calorie malnutrition - We'll start on ensure  Hypokalemia - Repleted   DVT prophylaxis: Lovenox Code Status: Full code Family Communication: Son at bedside  Disposition Plan: PT recommended SNF, but some request patient to go home, so would have PT to evaluate the patient regarding discharge recommendation, but it likely tomorrow.  Consultants:   Cardiology  Procedures:   None  Antimicrobials:   None    Subjective: Alert, laying in bed, which she can go home.  Objective: Filed Vitals:   04/16/16 2034 04/16/16 2200 04/17/16 0522 04/17/16 0732  BP:  97/59 95/51   Pulse:  75 73   Temp:  98 F (36.7 C) 98.2 F (36.8 C)   TempSrc:  Oral Oral   Resp:  20 18   Height:      Weight:   40.824 kg (90 lb)   SpO2: 91% 98% 99% 96%   No intake or output data in the 24 hours ending 04/17/16 1226 Filed Weights   04/15/16 0620 04/16/16 0516 04/17/16 0522  Weight: 41.61 kg (91 lb 11.7 oz) 40.869 kg (90 lb 1.6 oz) 40.824 kg (90 lb)    Examination:  General exam: Alert, awake, Oriented 3 ,Frail, cachectic . Respiratory system: Clear to auscultation. Respiratory effort normal.  Cardiovascular system:RRR. No murmurs, rubs, gallops. Gastrointestinal system: Abdomen is nondistended, soft and nontender. No organomegaly or masses felt. Normal bowel sounds heard. Central nervous system: Somnolent. No focal neurological deficits. Extremities: Trace bilateral pitting edema bilaterally +pedal pulses Skin: No rashes, lesions or ulcers Psychiatry: Unable to assess  given current mental state    Data Reviewed: I have personally reviewed following labs and imaging studies  CBC:  Recent Labs Lab 04/14/16 1640 04/15/16 0218 04/16/16 0530  WBC 9.4 8.5 7.6  NEUTROABS 7.9* 7.6  --   HGB 17.4* 15.4* 15.5*  HCT 51.3* 45.7 47.7*  MCV 96.6 96.8 97.1  PLT 142* 132* 126*   Basic Metabolic Panel:  Recent Labs Lab 04/14/16 1757 04/16/16 0530 04/17/16 0556  NA 139 139 140  K 3.9 3.3* 3.2*  CL 103 98* 100*  CO2 26 33* 32  GLUCOSE 97 101* 86  BUN 14 27* 26*  CREATININE 1.01* 1.01* 0.84  CALCIUM 9.0 8.5* 8.1*   GFR: Estimated Creatinine Clearance: 37.8 mL/min (by C-G formula based on Cr of 0.84). Liver Function Tests:  Recent Labs Lab 04/14/16 1757  AST 38  ALT 27  ALKPHOS 99  BILITOT 1.6*  PROT 6.6  ALBUMIN 4.0    Recent Labs Lab 04/14/16 1757  LIPASE 18   No results for input(s): AMMONIA in the last 168 hours. Coagulation Profile: No results for input(s): INR, PROTIME in the last 168 hours. Cardiac Enzymes:  Recent Labs Lab 04/14/16 1757 04/15/16 0218  TROPONINI 0.08* 0.10*   BNP (last 3 results) No results for input(s): PROBNP in the last 8760 hours. HbA1C: No results for input(s): HGBA1C in the last 72 hours. CBG: No results for input(s): GLUCAP in the last 168 hours. Lipid Profile: No results for input(s): CHOL, HDL, LDLCALC, TRIG, CHOLHDL, LDLDIRECT in the last 72 hours. Thyroid Function Tests: No results for input(s): TSH, T4TOTAL, FREET4, T3FREE, THYROIDAB in the last 72 hours. Anemia Panel: No results for input(s): VITAMINB12, FOLATE, FERRITIN, TIBC, IRON, RETICCTPCT in the last 72 hours. Urine analysis:    Component Value Date/Time   COLORURINE YELLOW 04/14/2016 1605   APPEARANCEUR CLEAR 04/14/2016 1605   LABSPEC 1.025 04/14/2016 1605   PHURINE 5.5 04/14/2016 1605   GLUCOSEU NEGATIVE 04/14/2016 1605   HGBUR SMALL* 04/14/2016 1605   BILIRUBINUR NEGATIVE 04/14/2016 1605   KETONESUR NEGATIVE  04/14/2016 1605   PROTEINUR NEGATIVE 04/14/2016 1605   NITRITE NEGATIVE 04/14/2016 1605   LEUKOCYTESUR SMALL* 04/14/2016 1605   Sepsis Labs: @LABRCNTIP (procalcitonin:4,lacticidven:4)  ) Recent Results (from the past 240 hour(s))  Culture, blood (routine x 2) Call Davidson if unable to obtain prior to antibiotics being given     Status: None (Preliminary result)   Collection Time: 04/14/16  4:40 PM  Result Value Ref Range Status   Specimen Description BLOOD RIGHT FOREARM DRAWN BY RN  Final   Special Requests BOTTLES DRAWN AEROBIC AND ANAEROBIC 6CC  Final   Culture NO GROWTH 2 DAYS  Final   Report Status PENDING  Incomplete  Culture, blood (routine x 2) Call Davidson if unable to obtain prior to antibiotics being given     Status: None (Preliminary result)   Collection Time: 04/14/16  5:57 PM  Result Value Ref Range Status   Specimen Description BLOOD LEFT HAND  Final   Special Requests BOTTLES DRAWN AEROBIC AND ANAEROBIC 6CC  Final   Culture NO GROWTH 2 DAYS  Final   Report Status PENDING  Incomplete  Radiology Studies: No results found.      Scheduled Meds: . aspirin  81 mg Oral Daily  . carvedilol  3.125 mg Oral BID WC  . enoxaparin (LOVENOX) injection  30 mg Subcutaneous Q24H  . feeding supplement (ENSURE ENLIVE)  237 mL Oral TID BM  . furosemide  40 mg Oral Daily  . [START ON 04/18/2016] potassium chloride  20 mEq Oral Daily  . simvastatin  40 mg Oral q1800   Continuous Infusions:    LOS: 3 days    Time spent: 25 minutes. Greater than 50% of this time was spent in direct contact with the patient coordinating care.     Huey Bienenstock, [facility] Pager [phone]  If 7PM-7AM, please contact night-coverage [email or web address] Password [facility] 04/17/2016, 12:26 PM

## 2016-04-17 NOTE — Clinical Social Work Note (Signed)
CSW met with pt to discuss d/c planning. Pt states she was feeling awful and wanted CSW to call her son, Ruth Davidson. However, she shared to tell him that she wants to go home. CSW left voicemail for Ruth Davidson and awaiting return call.   Ruth Davidson, [place]

## 2016-04-17 NOTE — Progress Notes (Signed)
SATURATION QUALIFICATIONS: (This note is used to comply with regulatory documentation for home oxygen)  Patient Saturations on Room Air at Rest = 91%  Patient Saturations on Room Air while Ambulating = 86%  Patient Saturations on 4 Liters of oxygen while Ambulating = 94%  Please briefly explain why patient needs home oxygen:

## 2016-04-17 NOTE — Care Management Important Message (Signed)
Important Message  Patient Details  Name: Berneta SagesJudy M Bouknight MRN: [id number] Date of Birth: November 16, 1940   Medicare Important Message Given:  Yes    Malcolm MetroChildress, Ziare Cryder Demske, RN 04/17/2016, 8:33 AM

## 2016-04-17 NOTE — Progress Notes (Signed)
Primary Cardiologist: Purvis SheffieldKoneswaran MD  Cardiology Specific Problem List: 1. Systolic Dysfunction 2. Systolic CHF 3. PSVT 4. CAD  Subjective:    Tired, weak, does not feel well. No chest pain.  Objective:   Temp:  [98 F (36.7 C)-98.5 F (36.9 C)] 98.2 F (36.8 C) (06/16 0522) Pulse Rate:  [73-77] 73 (06/16 0522) Resp:  [18-20] 18 (06/16 0522) BP: (90-97)/(51-59) 95/51 mmHg (06/16 0522) SpO2:  [91 %-99 %] 96 % (06/16 0732) Weight:  [90 lb (40.824 kg)] 90 lb (40.824 kg) (06/16 0522) Last BM Date: 04/15/16  Filed Weights   04/15/16 0620 04/16/16 0516 04/17/16 0522  Weight: 91 lb 11.7 oz (41.61 kg) 90 lb 1.6 oz (40.869 kg) 90 lb (40.824 kg)   No intake or output data in the 24 hours ending 04/17/16 0832  Telemetry: NSR with one episode of PSVT at 8 am. HR 142 bpm.   Exam:  General: Cachectic-appearing, frail woman in no distress.  Lungs: Clear to auscultation, nonlabored.  Cardiac: No elevated JVP or bruits. RRR, no gallop or rub.   Abdomen: Normoactive bowel sounds, nontender, nondistended.  Extremities: No pitting edema, distal pulses 1+. Muscle wasting evident..  Lab Results:  Basic Metabolic Panel:  Recent Labs Lab 04/14/16 1757 04/16/16 0530 04/17/16 0556  NA 139 139 140  K 3.9 3.3* 3.2*  CL 103 98* 100*  CO2 26 33* 32  GLUCOSE 97 101* 86  BUN 14 27* 26*  CREATININE 1.01* 1.01* 0.84  CALCIUM 9.0 8.5* 8.1*    Liver Function Tests:  Recent Labs Lab 04/14/16 1757  AST 38  ALT 27  ALKPHOS 99  BILITOT 1.6*  PROT 6.6  ALBUMIN 4.0    CBC:  Recent Labs Lab 04/14/16 1640 04/15/16 0218 04/16/16 0530  WBC 9.4 8.5 7.6  HGB 17.4* 15.4* 15.5*  HCT 51.3* 45.7 47.7*  MCV 96.6 96.8 97.1  PLT 142* 132* 126*    Cardiac Enzymes:  Recent Labs Lab 04/14/16 1757 04/15/16 0218  TROPONINI 0.08* 0.10*    Echocardiogram: 04/15/2016 Left ventricle: cannot r/o mural apical thrombus.although less  likely on definity images The cavity size  was severely dilated.  Wall thickness was normal. The estimated ejection fraction was  20%. Diffuse hypokinesis. Doppler parameters are consistent with  both elevated ventricular end-diastolic filling pressure and  elevated left atrial filling pressure. - Mitral valve: There was mild regurgitation. - Atrial septum: No defect or patent foramen ovale was identified.  Medications:   Scheduled Medications: . aspirin  81 mg Oral Daily  . carvedilol  3.125 mg Oral BID WC  . enoxaparin (LOVENOX) injection  30 mg Subcutaneous Q24H  . furosemide  40 mg Oral Daily  . simvastatin  40 mg Oral q1800   PRN Medications: guaiFENesin-dextromethorphan, ipratropium, levalbuterol, LORazepam, nitroGLYCERIN   Assessment and Plan:   1. Acute (on possibly chronic) systolic CHF: Diuesed with IV lasix. Transitioned to PO yesterday. Wt down 9 lbs since admission. No I/O documented  Lungs are clear without evidence of decompensation. Hypokalemic - will begin daily replacement. Give two doses of 20mEq today. Continue po lasix 40 mg daily for now to keep I=O.  2. Cardiomyopathy: LVEF is 20% down from normal EF in 2010. She is now on carvedilol 3.125 mg BID which was started yesterday with discontinuation of metoprolol. She has had PSVT this am. She is on Xopenex neb treatments which may be contributing.    .   3. CAD:  Hx of BMS to RCA  in 2006 with distal LAD disease which was being treated medially. Continue Coreg, ACE/ ARB is not started due to hypotension. Minor troponin I elevation is not clearly consistent with ACS. Dr. Eden Emms mentioned the possibility of right and left heart catheterization ultimately.  4. Ongoing tobacco abuse: Cessation is recommended.  Bettey Mare. Lawrence NP [facility]   04/17/2016, 8:32 AM    Attending note:  Patient seen and examined. Discussed case with Ms. Lawrence NP and reviewed Dr. Fabio Bering consult note. Ms. Contee presents with failure to thrive associated with acute on  possibly chronic systolic heart failure and significant cachexia. LVEF is now approximately 15% based on my review of her echocardiogram, there is no definitive apical LV mural thrombus with Definity contrast, muscle trabeculation noted. She has been diuresed, volume status improved. Medical therapy now includes Coreg, oral Lasix, and Zocor. She is not on ACE inhibitor or ARB due to low blood pressure with systolic in the 90s. Burst of SVT noted by telemetry. Question raised about the possibility of a right and left heart catheterization, although patient is quite frail and cachectic at this time, appears to have end-stage heart disease with cardiac cachexia. May want to consider Palliative Care consultation to understand what realistic goals for her care would be prior to embarking on an aggressive invasive workup.  Jonelle Sidle, M.D., F.A.C.C.

## 2016-04-18 LAB — PROCALCITONIN: PROCALCITONIN: 0.11 ng/mL

## 2016-04-18 LAB — HIV ANTIBODY (ROUTINE TESTING W REFLEX): HIV Screen 4th Generation wRfx: NONREACTIVE

## 2016-04-18 MED ORDER — ENSURE ENLIVE PO LIQD: 237.0000 mL | Freq: Three times a day (TID) | ORAL | Status: DC

## 2016-04-18 MED ORDER — FUROSEMIDE 40 MG PO TABS: 40.0000 mg | ORAL_TABLET | Freq: Every day | ORAL | Status: DC

## 2016-04-18 MED ORDER — POTASSIUM CHLORIDE CRYS ER 20 MEQ PO TBCR: 20.0000 meq | EXTENDED_RELEASE_TABLET | Freq: Every day | ORAL | Status: DC

## 2016-04-18 MED ORDER — CARVEDILOL 3.125 MG PO TABS: 3.1250 mg | ORAL_TABLET | Freq: Two times a day (BID) | ORAL | Status: DC

## 2016-04-18 MED ORDER — ASPIRIN 81 MG PO CHEW: 81.0000 mg | CHEWABLE_TABLET | Freq: Every day | ORAL | Status: DC

## 2016-04-18 NOTE — Progress Notes (Addendum)
CM able to speak with daughter Dois DavenportSandra [facility]. Patient will be receiving oxygen at home as well the portable.  [facility] and [facility].  Informed the company will not be coming every day and will only come as needed and choice if they still want to participate with services.  Patient and family refused the need for the hospital bed.  Stated no room for the hospital bed and patient will continue to sleep on sofa bed. Will accept the rolling walker.  No other services requested.  CM faxed paperwork to [facility] for processing.

## 2016-04-18 NOTE — Progress Notes (Signed)
Patient discharged home.  IV removed - WNL.  Reviewed DC instructions, medications, and HF management at home. Verbalized understanding.  Instructed on follow up appointments.  No questions at this time.  HH set up, but patient refuses to wait on delivery of O2 to room.  Encouraged patient to wait and explained risks of leaving without O2, but still insists on leaving now.  [facility] notified and made aware to only do home delivery.  Left floor in NAD via WC with NT.

## 2016-04-18 NOTE — Progress Notes (Signed)
SATURATION QUALIFICATIONS: (This note is used to comply with regulatory documentation for home oxygen)  Patient Saturations on Room Air at Rest = 91%  Patient Saturations on Room Air while Ambulating = 86%  Patient Saturations on 2 Liters of oxygen while Ambulating = 92%  Please briefly explain why patient needs home oxygen: O2 sats decreased on RA while ambulating, O2 benefited by placing on 2 L O2

## 2016-04-18 NOTE — Discharge Instructions (Signed)
Follow with Primary MD FUSCO,LAWRENCE J., MD in 7 days  ° °Get CBC, CMP, 2 view Chest X ray checked  by Primary MD next visit.  ° ° °Activity: As tolerated with Full fall precautions use walker/cane & assistance as needed ° ° °Disposition Home  ° °Diet: Heart Healthy  , with feeding assistance and aspiration precautions. ° °For Heart failure patients - Check your Weight same time everyday, if you gain over 2 pounds, or you develop in leg swelling, experience more shortness of breath or chest pain, call your Primary MD immediately. Follow Cardiac Low Salt Diet and 1.5 lit/day fluid restriction. ° ° °On your next visit with your primary care physician please Get Medicines reviewed and adjusted. ° ° °Please request your Prim.MD to go over all Hospital Tests and Procedure/Radiological results at the follow up, please get all Hospital records sent to your Prim MD by signing hospital release before you go home. ° ° °If you experience worsening of your admission symptoms, develop shortness of breath, life threatening emergency, suicidal or homicidal thoughts you must seek medical attention immediately by calling 911 or calling your MD immediately  if symptoms less severe. ° °You Must read complete instructions/literature along with all the possible adverse reactions/side effects for all the Medicines you take and that have been prescribed to you. Take any new Medicines after you have completely understood and accpet all the possible adverse reactions/side effects.  ° °Do not drive, operating heavy machinery, perform activities at heights, swimming or participation in water activities or provide baby sitting services if your were admitted for syncope or siezures until you have seen by Primary MD or a Neurologist and advised to do so again. ° °Do not drive when taking Pain medications.  ° ° °Do not take more than prescribed Pain, Sleep and Anxiety Medications ° °Special Instructions: If you have smoked or chewed Tobacco  in  the last 2 yrs please stop smoking, stop any regular Alcohol  and or any Recreational drug use. ° °Wear Seat belts while driving. ° ° °Please note ° °You were cared for by a hospitalist during your hospital stay. If you have any questions about your discharge medications or the care you received while you were in the hospital after you are discharged, you can call the unit and asked to speak with the hospitalist on call if the hospitalist that took care of you is not available. Once you are discharged, your primary care physician will handle any further medical issues. Please note that NO REFILLS for any discharge medications will be authorized once you are discharged, as it is imperative that you return to your primary care physician (or establish a relationship with a primary care physician if you do not have one) for your aftercare needs so that they can reassess your need for medications and monitor your lab values. ° °

## 2016-04-18 NOTE — Discharge Summary (Signed)
Ruth Davidson, is a 75 y.o. female  DOB [birth year]  MRN [id number].  Admission date:  04/14/2016  Admitting Physician  Briscoe Deutscher, MD  Discharge Date:  04/18/2016   Primary MD  Cassell Smiles., MD  Recommendations for primary care physician for things to follow:  - patient to follow with cardiology on scheduled appointment on 6/29 - Started on 2 L nasal cannula -  check CBC, BMP during next visit   Admission Diagnosis  Community acquired pneumonia [J18.9]   Discharge Diagnosis  Community acquired pneumonia [J18.9]    Principal Problem:   Acute systolic CHF (congestive heart failure) (HCC) Active Problems:   CAD (coronary artery disease)   Hyperlipidemia   Tobacco abuse   Pneumonia   SIRS due to infectious process without acute organ dysfunction (HCC)   Community acquired pneumonia   NSVT (nonsustained ventricular tachycardia) (HCC)      Past Medical History  Diagnosis Date  . MI (myocardial infarction) (HCC)     History reviewed. No pertinent past surgical history.     History of present illness and  Hospital Course:     Kindly see H&P for history of present illness and admission details, please review complete Labs, Consult reports and Test reports for all details in brief  HPI  from the history and physical done on the day of admission 04/14/2016 Ruth Davidson is a 75 y.o. female with medical history significant for coronary artery disease with stent and ongoing tobacco abuse who presents the emergency department with approximately 3 days of progressive fatigue, malaise, dyspnea, and an episode of nausea with vomiting last night. Patient is quite somnolent at time of my exam after receiving Ativan injection in the emergency department, unable to contribute much to the history, which is therefore gleaned from discussion with the ED personnel, review of the EMR, and discussion  with the patient's son at the bedside. Ruth Davidson lives alone with her son checking in on her frequently. She had reportedly been feeling ill with generalized weakness, dyspnea, and malaise for the past 3 days, prompting her son to call EMS out to the house yesterday. She was reportedly saturating well on room air for EMS yesterday and remained at home. Today, her son found her condition to have worsened with fever and increased somnolence and brought her in for evaluation. She had not made any complaints of chest pain or diarrhea. She has not complained of headaches and did not appear to be confused. She was ambulating appropriately prior to arrival.  ED Course: Upon arrival to the ED, patient is found to be febrile to 38.1 C, saturating adequately on room air, but tachypneic to 31, and with sinus tachycardia to 120s. Blood pressure has remained stable in the emergency department. EKG features a sinus tachycardia with rate 124 and LVH by voltage criteria with secondary repolarization abnormality. Troponin is at the upper limits of normal at 0.04. Chest x-ray demonstrates cardiomegaly with mild interstitial edema and small bilateral effusions with associated bibasilar airspace  disease suggestive of atelectasis or pneumonia. Chemistry panel features mild elevations in serum creatinine and total bilirubin to 1.01 and 1.6, respectively. CBC is notable for hemoglobin elevated to 17.4, mild thrombocytopenia with platelet count 142,000. And normal WBC, but with a neutrophilia, ANC 7900. Urine was obtained for analysis and grossly normal. Lactic acid is reassuring at 1.27. Patient was given a dose of Levaquin and treated symptomatically with Ativan and nebulized breathing treatments. She remained tachycardic in the 120s but was stable blood pressure and will be admitted to the telemetry unit for ongoing evaluation and management of suspected community-acquired pneumonia.   Hospital Course  75 year old woman  admitted on 6/13 with complaints of fatigue, dyspnea. She has a history of coronary artery disease as well as systolic CHF. It appears she quit taking her medications. On admission she was found to be febrile and treatment for community-acquired pneumonia was initiated. Subsequent CT scan of the chest shows no evidence for pneumonia in instead shows interstitial edema most consistent with acute CHF. Fever has resolved. Source of fever not clear at this time. Antibiotics will be discontinued on 6/14. She also had a 15 beat run of nonsustained ventricular tachycardia on 6/13.  Acute on chronic systolic CHF -Echo shows an ejection fraction of 20%. - Diuresis per cardiology , has transitioned Lasix over to by mouth, volume status improved, to be discharged on 40 mg oral Lasix daily. 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 - Discussed with cardiology regarding questionable LV mural thrombus, Dr Diona BrownerMcDowell reviewed the echo, there is no definitive apical LV mural thrombus with Definity contrast, muscle trabeculation noted, unlikely patient having LV mural thrombus.  Acute hypoxic respiratory failure - This is secondary to CHF, patient will need oxygen on discharge.\  Nonsustained ventricular tachycardia -Likely due to depressed ejection fraction. -Continue Lopressor. -Asymptomatic.  Fever -Source unclear, off antibiotics for now. Remains afebrile since admission.  Acute encephalopathy -Patient has been somnolent, etiology remains unclear. -Resolved, back to baseline per family  Protein calorie malnutrition - We'll start on ensure  Hypokalemia - Repleted, we'll discharge on supplements  on discharge  Discharge Condition:  stable   Follow UP  Follow-up Information    Go to Joni ReiningKathryn Lawrence, NP.   Specialties:  Nurse Practitioner, Radiology, Cardiology   Why:  Thursday 6/29 at 2:50 PM   Contact information:   [address] [place] [address] [phone]       Follow up with Cassell SmilesFUSCO,LAWRENCE J., MD. Schedule an appointment as soon as possible for a visit in 1 week.   Specialty:  Internal Medicine   Contact information:   [address] [place] [address] [phone]         Discharge Instructions  and  Discharge Medications   Discharge Instructions    Discharge instructions    Complete by:  As directed   Follow with Primary MD Cassell SmilesFUSCO,LAWRENCE J., MD in 7 days   Get CBC, CMP, 2 view Chest X ray checked  by Primary MD next visit.    Activity: As tolerated with Full fall precautions use walker/cane & assistance as needed   Disposition Home    Diet: Heart Healthy , with feeding assistance  and aspiration precautions.  For Heart failure patients - Check your Weight same time everyday, if you gain over 2 pounds, or you develop in leg swelling, experience more shortness of breath or chest pain, call your Primary MD immediately. Follow Cardiac Low Salt Diet and 1.5 lit/day fluid restriction.   On your next visit with your primary care physician please Get Medicines reviewed and adjusted.   Please request your Prim.MD to go over all Hospital Tests and Procedure/Radiological results at the follow up, please get all Hospital records sent to your Prim MD by signing hospital release before you go home.   If you experience worsening of your admission symptoms, develop shortness of breath, life threatening emergency, suicidal or homicidal thoughts you must seek medical attention immediately by calling 911 or calling your MD immediately  if symptoms less severe.  You Must read complete instructions/literature along with all the possible adverse  reactions/side effects for all the Medicines you take and that have been prescribed to you. Take any new Medicines after you have completely understood and accpet all the possible adverse reactions/side effects.   Do not drive, operating heavy machinery, perform activities at heights, swimming or participation in water activities or provide baby sitting services if your were admitted for syncope or siezures until you have seen by Primary MD or a Neurologist and advised to do so again.  Do not drive when taking Pain medications.    Do not take more than prescribed Pain, Sleep and Anxiety Medications  Special Instructions: If you have smoked or chewed Tobacco  in the last 2 yrs please stop smoking, stop any regular Alcohol  and or any Recreational drug use.  Wear Seat belts while driving.   Please note  You were cared for by a hospitalist during your hospital stay. If you have any questions about your discharge medications or the care you received while you were in the hospital after you are discharged, you can call the unit and asked to speak with the hospitalist on call if the hospitalist that took care of you is not available. Once you are discharged, your primary care physician will handle any further medical issues. Please note that NO REFILLS for any discharge medications will be authorized once you are discharged, as it is imperative that you return to your primary care physician (or establish a relationship with a primary care physician if you do not have one) for your aftercare needs so that they can reassess your need for medications and monitor your lab values.     Increase activity slowly    Complete by:  As directed             Medication List    STOP taking these medications        metoprolol tartrate 25 MG tablet  Commonly known as:  LOPRESSOR     spironolactone 25 MG tablet  Commonly known as:  ALDACTONE      TAKE these medications        aspirin 81 MG chewable  tablet  Chew 1 tablet (81 mg total) by mouth daily.     carvedilol 3.125 MG tablet  Commonly known as:  COREG  Take 1 tablet (3.125 mg total) by mouth 2 (two) times daily with a meal.     feeding supplement (ENSURE ENLIVE) Liqd  Take 237 mLs by mouth 3 (three) times daily between meals.     furosemide 40 MG tablet  Commonly known as:  LASIX  Take  1 tablet (40 mg total) by mouth daily.     nitroGLYCERIN 0.4 MG SL tablet  Commonly known as:  NITROSTAT  Place 1 tablet (0.4 mg total) under the tongue every 5 (five) minutes as needed for chest pain (or back pain or arm pain).     potassium chloride SA 20 MEQ tablet  Commonly known as:  K-DUR,KLOR-CON  Take 1 tablet (20 mEq total) by mouth daily.     simvastatin 80 MG tablet  Commonly known as:  ZOCOR  Take 1 tablet (80 mg total) by mouth at bedtime.          Diet and Activity recommendation: See Discharge Instructions above   Consults obtained -  cardiology   Major procedures and Radiology Reports - PLEASE review detailed and final reports for all details, in brief -    Dg Chest 2 View  04/14/2016  CLINICAL DATA:  Shortness of breath, fever and weakness. EXAM: CHEST  2 VIEW COMPARISON:  PA and lateral chest 08/11/2004. Single view of the chest 11/10/2015. FINDINGS: The lungs are markedly emphysematous. There are small bilateral pleural effusions with basilar airspace disease, worse on the right. Heart size is enlarged with mild interstitial edema seen. No pneumothorax. IMPRESSION: Cardiomegaly and mild interstitial edema. Small bilateral pleural effusions and basilar airspace disease, greater on the right. Airspace disease could be due to atelectasis or pneumonia. Electronically Signed   By: Drusilla Kanner M.D.   On: 04/14/2016 17:22   Ct Angio Chest Pe W/cm &/or Wo Cm  04/14/2016  CLINICAL DATA:  Three day history of fatigue, lanced, dyspnea, nausea, and vomiting. Shortness of breath. Increased D-dimer. EXAM: CT ANGIOGRAPHY  CHEST WITH CONTRAST TECHNIQUE: Multidetector CT imaging of the chest was performed using the standard protocol during bolus administration of intravenous contrast. Multiplanar CT image reconstructions and MIPs were obtained to evaluate the vascular anatomy. CONTRAST:  80 mL Isovue 370 COMPARISON:  None. FINDINGS: Technically adequate study with good opacification of the central and segmental pulmonary arteries. No focal filling defects. No evidence of significant pulmonary embolus. Diffuse cardiac enlargement with particular left ventricular and left atrial dilatation. Normal caliber thoracic aorta. No aortic dissection. Great vessel origins are patent. Esophagus is decompressed. No significant lymphadenopathy in the chest. Heterogeneous enlargement of the right lobe of the thyroid gland. Moderate size bilateral pleural effusions. Diffuse interstitial and airspace infiltrates in the lungs. Appearance is nonspecific but likely to represent edema. Emphysematous changes in the upper lungs. Airways thickening. No pneumothorax. Included portions of the upper abdominal organs are grossly unremarkable. Degenerative changes in the spine. Review of the MIP images confirms the above findings. IMPRESSION: No evidence of significant pulmonary embolus. Cardiac enlargement. Bilateral pleural effusions. Diffuse airspace and interstitial disease, likely edema. Changes probably indicate congestive failure. Electronically Signed   By: Burman Nieves M.D.   On: 04/14/2016 23:58    Micro Results    Recent Results (from the past 240 hour(s))  Culture, blood (routine x 2) Call MD if unable to obtain prior to antibiotics being given     Status: None (Preliminary result)   Collection Time: 04/14/16  4:40 PM  Result Value Ref Range Status   Specimen Description BLOOD RIGHT FOREARM DRAWN BY RN  Final   Special Requests BOTTLES DRAWN AEROBIC AND ANAEROBIC 6CC  Final   Culture NO GROWTH 2 DAYS  Final   Report Status PENDING   Incomplete  Culture, blood (routine x 2) Call MD if unable to obtain prior to antibiotics  being given     Status: None (Preliminary result)   Collection Time: 04/14/16  5:57 PM  Result Value Ref Range Status   Specimen Description BLOOD LEFT HAND  Final   Special Requests BOTTLES DRAWN AEROBIC AND ANAEROBIC 6CC  Final   Culture NO GROWTH 2 DAYS  Final   Report Status PENDING  Incomplete       Today   Subjective:   Caeleigh Prohaska today has no headache,no chest or abdominal pain, feels much better wants to go home today.   Objective:   Blood pressure 105/69, pulse 71, temperature 97.9 F (36.6 C), temperature source Oral, resp. rate 18, height 5\' 6"  (1.676 m), weight 39.009 kg (86 lb), SpO2 91 %.   Intake/Output Summary (Last 24 hours) at 04/18/16 1130 Last data filed at 04/18/16 0900  Gross per 24 hour  Intake    100 ml  Output      0 ml  Net    100 ml    Exam General exam: Alert, awake, Oriented 3 ,Frail, cachectic . Respiratory system: Clear to auscultation. Respiratory effort normal. Cardiovascular system:RRR. No murmurs, rubs, gallops. Gastrointestinal system: Abdomen is nondistended, soft and nontender. No organomegaly or masses felt. Normal bowel sounds heard. Central nervous system: Somnolent. No focal neurological deficits. Extremities: Trace bilateral pitting edema bilaterally +pedal pulses Skin: No rashes, lesions or ulcers Psychiatry: Unable to assess given current mental state  Data Review   CBC w Diff: Lab Results  Component Value Date   WBC 7.6 04/16/2016   HGB 15.5* 04/16/2016   HCT 47.7* 04/16/2016   PLT 126* 04/16/2016   LYMPHOPCT 9 04/15/2016   MONOPCT 2 04/15/2016   EOSPCT 0 04/15/2016   BASOPCT 0 04/15/2016    CMP: Lab Results  Component Value Date   NA 140 04/17/2016   K 3.2* 04/17/2016   CL 100* 04/17/2016   CO2 32 04/17/2016   BUN 26* 04/17/2016   CREATININE 0.84 04/17/2016   PROT 6.6 04/14/2016   ALBUMIN 4.0 04/14/2016   BILITOT  1.6* 04/14/2016   ALKPHOS 99 04/14/2016   AST 38 04/14/2016   ALT 27 04/14/2016  .   Total Time in preparing paper work, data evaluation and todays exam - 35 minutes  Winona Sison M.D on 04/18/2016 at 11:30 AM  [facility]   Office  [phone]

## 2016-04-19 LAB — CULTURE, BLOOD (ROUTINE X 2)
CULTURE: NO GROWTH
CULTURE: NO GROWTH

## 2016-04-20 DIAGNOSIS — I5040 Unspecified combined systolic (congestive) and diastolic (congestive) heart failure: Secondary | ICD-10-CM | POA: Diagnosis not present

## 2016-04-28 MED FILL — Perflutren Lipid Microsphere IV Susp 1.1 MG/ML: INTRAVENOUS | Qty: 10 | Status: AC

## 2016-04-30 ENCOUNTER — Encounter: Payer: Self-pay | Admitting: Adult Health

## 2016-04-30 ENCOUNTER — Encounter: Payer: Medicare Other | Admitting: Adult Health

## 2016-04-30 NOTE — Progress Notes (Signed)
Cardiology Office Note   Date:  04/30/2016   ID:  Janae SauceJudy M Vanwart, DOB 1941/07/31, MRN [id number]  PCP:  Cassell SmilesFUSCO,Alia Parsley J., MD  Cardiologist: Inis SizerKoneswaran/  Sequoyah Ramone, NP   NO SHOW

## 2016-06-08 ENCOUNTER — Other Ambulatory Visit: Payer: Self-pay

## 2016-06-08 MED ORDER — POTASSIUM CHLORIDE CRYS ER 20 MEQ PO TBCR
20.0000 meq | EXTENDED_RELEASE_TABLET | Freq: Every day | ORAL | 6 refills | Status: DC
Start: 2016-06-08 — End: 2016-06-11

## 2016-06-08 MED ORDER — POTASSIUM CHLORIDE CRYS ER 20 MEQ PO TBCR: 20.0000 meq | EXTENDED_RELEASE_TABLET | Freq: Every day | ORAL | 6 refills | Status: DC

## 2016-06-11 ENCOUNTER — Encounter: Payer: Self-pay | Admitting: Cardiology

## 2016-06-11 ENCOUNTER — Ambulatory Visit (INDEPENDENT_AMBULATORY_CARE_PROVIDER_SITE_OTHER): Payer: Medicare Other | Admitting: Cardiology

## 2016-06-11 ENCOUNTER — Encounter (INDEPENDENT_AMBULATORY_CARE_PROVIDER_SITE_OTHER): Payer: Self-pay

## 2016-06-11 VITALS — BP 108/80 | HR 82 | Ht 66.0 in | Wt 87.0 lb

## 2016-06-11 DIAGNOSIS — I5021 Acute systolic (congestive) heart failure: Secondary | ICD-10-CM | POA: Diagnosis not present

## 2016-06-11 DIAGNOSIS — I420 Dilated cardiomyopathy: Secondary | ICD-10-CM | POA: Diagnosis not present

## 2016-06-11 DIAGNOSIS — F0390 Unspecified dementia without behavioral disturbance: Secondary | ICD-10-CM

## 2016-06-11 DIAGNOSIS — I519 Heart disease, unspecified: Secondary | ICD-10-CM

## 2016-06-11 DIAGNOSIS — E785 Hyperlipidemia, unspecified: Secondary | ICD-10-CM

## 2016-06-11 DIAGNOSIS — J439 Emphysema, unspecified: Secondary | ICD-10-CM | POA: Insufficient documentation

## 2016-06-11 MED ORDER — FUROSEMIDE 20 MG PO TABS: 20.0000 mg | ORAL_TABLET | Freq: Every day | ORAL | 3 refills | Status: DC

## 2016-06-11 NOTE — Progress Notes (Signed)
06/11/2016 Ruth Davidson   October 05, 1941  [id number]  Primary Physician No PCP Per Patient Primary Cardiologist: Dr Purvis SheffieldKoneswaran  HPI:  75 yr old woman who is a former pt of [facility]. She has known CAD with an inferior wall MI in 2003 for which she received a bare metal stent, and then had an anterior wall MI due to distal LAD disease which was treated medically due to the diffuse nature of her disease. She has not experienced any chest pain since 2006. She was admitted in June 2017 with CHF. Echo showed severley dilated LV with an EF of 20%. She was diuresed, discharge wgt 86 lbs. The pt declined home O2 or hospital bed at discharge. Dr Diona BrownerMcDowell felt she had end stage CM and cardiac cachexia and suggested Palliative Care consult for goals of care but I don't think the pt was receptive to this. Her B/P was low and this limited medical Rx (no ACE or ARB). This is the first time she has been seen by us since discharge. She came to the office with her daughter, the pt now lives with her daughter full time. Her daughter tells me her mother is having memory problems. She has to be with her constantly. She is now longer able to work at her job. The pt appears to be stable. Her wgt is unchanged from discharge and she denies any unusual dyspnea. She did run out of Lasix two days ago.    Current Outpatient Prescriptions  Medication Sig Dispense Refill  . aspirin 81 MG chewable tablet Chew 1 tablet (81 mg total) by mouth daily. 30 tablet 1  . carvedilol (COREG) 3.125 MG tablet Take 1 tablet (3.125 mg total) by mouth 2 (two) times daily with a meal. 60 tablet 1  . feeding supplement, ENSURE ENLIVE, (ENSURE ENLIVE) LIQD Take 237 mLs by mouth 3 (three) times daily between meals. 237 mL 12  . nitroGLYCERIN (NITROSTAT) 0.4 MG SL tablet Place 1 tablet (0.4 mg total) under the tongue every 5 (five) minutes as needed for chest pain (or back pain or arm pain). 30 tablet 0  . potassium chloride SA (K-DUR,KLOR-CON) 20 MEQ  tablet Take 1 tablet (20 mEq total) by mouth daily. 30 tablet 6  . simvastatin (ZOCOR) 80 MG tablet Take 1 tablet (80 mg total) by mouth at bedtime. 30 tablet 11  . furosemide (LASIX) 20 MG tablet Take 1 tablet (20 mg total) by mouth daily. 90 tablet 3   No current facility-administered medications for this visit.     No Known Allergies  Social History   Social History  . Marital status: Married    Spouse name: N/A  . Number of children: N/A  . Years of education: N/A   Occupational History  . Not on file.   Social History Main Topics  . Smoking status: Current Every Day Smoker    Packs/day: 0.25    Years: 50.00    Types: Cigarettes    Start date: 11/02/1958  . Smokeless tobacco: Never Used  . Alcohol use Not on file  . Drug use: Unknown  . Sexual activity: Not on file   Other Topics Concern  . Not on file   Social History Narrative  . No narrative on file     Review of Systems: General: negative for chills, fever, night sweats or weight changes.  Cardiovascular: negative for chest pain, palpitations, Dermatological: negative for rash Respiratory: chronic DOE Urologic: negative for hematuria Abdominal: negative for nausea, vomiting,  diarrhea, bright red blood per rectum, melena, or hematemesis Neurologic: negative for visual changes, syncope, or dizziness All other systems reviewed and are otherwise negative except as noted above.    Blood pressure 108/80, pulse 82, height  (1.676 m), weight 87 lb (39.5 kg), SpO2 95 %.  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  Dementia [name] tells me her mother is loosing her memory and is confused at times. The pt is now living with her daughter full time.    PLAN  I resumed her Lasix at 20 mg daily, continue K+ 20 meq daily. F/U with Dr Purvis Sheffield in 3 months. I refered them to social services. Also, they're previous PCP apparently has dismissed them since she hasn't been seen if a few years, she was encouraged to find a PCP and was provided with some options.   Corine Shelter PA-C 06/11/2016 4:10 PM

## 2016-06-11 NOTE — Assessment & Plan Note (Signed)
Hospitalized June 2017

## 2016-06-11 NOTE — Assessment & Plan Note (Signed)
On high dose statin Rx 

## 2016-06-11 NOTE — Assessment & Plan Note (Signed)
EF 15-20% by echo June 2017

## 2016-06-11 NOTE — Assessment & Plan Note (Signed)
Hx of RCA BMS 2006

## 2016-06-11 NOTE — Assessment & Plan Note (Signed)
[name] tells me her mother is loosing her memory and is confused at times. The pt is now living with her daughter full time.

## 2016-06-11 NOTE — Assessment & Plan Note (Signed)
Wgt today same as discharge wgt in June 86 lbs.

## 2016-06-11 NOTE — Assessment & Plan Note (Signed)
Severe

## 2016-06-11 NOTE — Patient Instructions (Signed)
Your physician recommends that you schedule a follow-up appointment in: 3 Months with Dr. Purvis SheffieldKoneswaran.   Your physician has recommended you make the following change in your medication:   Lasix 20 mg Take one Tablet Daily   If you need a refill on your cardiac medications before your next appointment, please call your pharmacy.  Thank you for choosing  [facility]!

## 2016-08-28 ENCOUNTER — Encounter (HOSPITAL_COMMUNITY): Payer: Self-pay | Admitting: Emergency Medicine

## 2016-08-28 ENCOUNTER — Emergency Department (HOSPITAL_COMMUNITY): Payer: Medicare Other

## 2016-08-28 ENCOUNTER — Inpatient Hospital Stay (HOSPITAL_COMMUNITY)
Admission: EM | Admit: 2016-08-28 | Discharge: 2016-08-31 | DRG: 291 | Disposition: A | Discharge status: Home / Self Care | Payer: Medicare Other | Attending: Internal Medicine | Admitting: Internal Medicine

## 2016-08-28 DIAGNOSIS — F17210 Nicotine dependence, cigarettes, uncomplicated: Secondary | ICD-10-CM | POA: Diagnosis present

## 2016-08-28 DIAGNOSIS — Z681 Body mass index (BMI) 19 or less, adult: Secondary | ICD-10-CM | POA: Diagnosis not present

## 2016-08-28 DIAGNOSIS — I5023 Acute on chronic systolic (congestive) heart failure: Secondary | ICD-10-CM | POA: Diagnosis not present

## 2016-08-28 DIAGNOSIS — I509 Heart failure, unspecified: Secondary | ICD-10-CM

## 2016-08-28 DIAGNOSIS — Z8249 Family history of ischemic heart disease and other diseases of the circulatory system: Secondary | ICD-10-CM | POA: Diagnosis not present

## 2016-08-28 DIAGNOSIS — R531 Weakness: Secondary | ICD-10-CM | POA: Diagnosis not present

## 2016-08-28 DIAGNOSIS — Z9119 Patient's noncompliance with other medical treatment and regimen: Secondary | ICD-10-CM | POA: Diagnosis not present

## 2016-08-28 DIAGNOSIS — J189 Pneumonia, unspecified organism: Secondary | ICD-10-CM | POA: Diagnosis not present

## 2016-08-28 DIAGNOSIS — Z9049 Acquired absence of other specified parts of digestive tract: Secondary | ICD-10-CM

## 2016-08-28 DIAGNOSIS — I110 Hypertensive heart disease with heart failure: Secondary | ICD-10-CM | POA: Diagnosis not present

## 2016-08-28 DIAGNOSIS — J181 Lobar pneumonia, unspecified organism: Secondary | ICD-10-CM

## 2016-08-28 DIAGNOSIS — R0602 Shortness of breath: Secondary | ICD-10-CM | POA: Diagnosis present

## 2016-08-28 DIAGNOSIS — R05 Cough: Secondary | ICD-10-CM | POA: Diagnosis not present

## 2016-08-28 DIAGNOSIS — I252 Old myocardial infarction: Secondary | ICD-10-CM

## 2016-08-28 DIAGNOSIS — F0390 Unspecified dementia without behavioral disturbance: Secondary | ICD-10-CM | POA: Diagnosis present

## 2016-08-28 DIAGNOSIS — Z7982 Long term (current) use of aspirin: Secondary | ICD-10-CM | POA: Diagnosis not present

## 2016-08-28 DIAGNOSIS — I420 Dilated cardiomyopathy: Secondary | ICD-10-CM | POA: Diagnosis not present

## 2016-08-28 DIAGNOSIS — E46 Unspecified protein-calorie malnutrition: Secondary | ICD-10-CM | POA: Diagnosis not present

## 2016-08-28 DIAGNOSIS — J90 Pleural effusion, not elsewhere classified: Secondary | ICD-10-CM | POA: Diagnosis not present

## 2016-08-28 DIAGNOSIS — I2510 Atherosclerotic heart disease of native coronary artery without angina pectoris: Secondary | ICD-10-CM | POA: Diagnosis present

## 2016-08-28 DIAGNOSIS — J432 Centrilobular emphysema: Secondary | ICD-10-CM | POA: Diagnosis present

## 2016-08-28 DIAGNOSIS — Z91199 Patient's noncompliance with other medical treatment and regimen due to unspecified reason: Secondary | ICD-10-CM

## 2016-08-28 DIAGNOSIS — E785 Hyperlipidemia, unspecified: Secondary | ICD-10-CM | POA: Diagnosis present

## 2016-08-28 DIAGNOSIS — Z79899 Other long term (current) drug therapy: Secondary | ICD-10-CM

## 2016-08-28 LAB — URINALYSIS, ROUTINE W REFLEX MICROSCOPIC
GLUCOSE, UA: NEGATIVE mg/dL
Leukocytes, UA: NEGATIVE
Nitrite: NEGATIVE
Protein, ur: 30 mg/dL — AB
pH: 5.5 (ref 5.0–8.0)

## 2016-08-28 LAB — CBC WITH DIFFERENTIAL/PLATELET
BASOS PCT: 0 %
Basophils Absolute: 0 10*3/uL (ref 0.0–0.1)
EOS ABS: 0.1 10*3/uL (ref 0.0–0.7)
EOS PCT: 1 %
HCT: 46.5 % — ABNORMAL HIGH (ref 36.0–46.0)
Hemoglobin: 16 g/dL — ABNORMAL HIGH (ref 12.0–15.0)
Lymphocytes Relative: 16 %
Lymphs Abs: 1.4 10*3/uL (ref 0.7–4.0)
MCH: 34 pg (ref 26.0–34.0)
MCHC: 34.4 g/dL (ref 30.0–36.0)
MCV: 98.7 fL (ref 78.0–100.0)
MONO ABS: 0.7 10*3/uL (ref 0.1–1.0)
MONOS PCT: 8 %
NEUTROS PCT: 75 %
Neutro Abs: 6.6 10*3/uL (ref 1.7–7.7)
PLATELETS: 170 10*3/uL (ref 150–400)
RBC: 4.71 MIL/uL (ref 3.87–5.11)
RDW: 13.9 % (ref 11.5–15.5)
WBC: 8.7 10*3/uL (ref 4.0–10.5)

## 2016-08-28 LAB — COMPREHENSIVE METABOLIC PANEL
ALBUMIN: 3.3 g/dL — AB (ref 3.5–5.0)
ALT: 11 U/L — ABNORMAL LOW (ref 14–54)
ANION GAP: 6 (ref 5–15)
AST: 19 U/L (ref 15–41)
Alkaline Phosphatase: 81 U/L (ref 38–126)
BUN: 14 mg/dL (ref 6–20)
CO2: 26 mmol/L (ref 22–32)
Calcium: 8.9 mg/dL (ref 8.9–10.3)
Chloride: 107 mmol/L (ref 101–111)
Creatinine, Ser: 0.99 mg/dL (ref 0.44–1.00)
GFR calc non Af Amer: 55 mL/min — ABNORMAL LOW (ref >60)
GLUCOSE: 113 mg/dL — AB (ref 65–99)
POTASSIUM: 3.5 mmol/L (ref 3.5–5.1)
SODIUM: 139 mmol/L (ref 135–145)
TOTAL PROTEIN: 5.8 g/dL — AB (ref 6.5–8.1)
Total Bilirubin: 0.8 mg/dL (ref 0.3–1.2)

## 2016-08-28 LAB — PROCALCITONIN

## 2016-08-28 LAB — URINE MICROSCOPIC-ADD ON

## 2016-08-28 LAB — BRAIN NATRIURETIC PEPTIDE: B Natriuretic Peptide: 2410 pg/mL — ABNORMAL HIGH (ref 0.0–100.0)

## 2016-08-28 LAB — I-STAT TROPONIN, ED: Troponin i, poc: 0.04 ng/mL (ref 0.00–0.08)

## 2016-08-28 LAB — INFLUENZA PANEL BY PCR (TYPE A & B)
H1N1FLUPCR: NOT DETECTED
INFLAPCR: NEGATIVE
INFLBPCR: NEGATIVE

## 2016-08-28 MED ORDER — DEXTROSE 5 % IV SOLN
1.0000 g | INTRAVENOUS | Status: DC
  Administered 2016-08-29 – 2016-08-30 (×2): 1 g via INTRAVENOUS
  Filled 2016-08-28 (×3): qty 10

## 2016-08-28 MED ORDER — FUROSEMIDE 10 MG/ML IJ SOLN
40.0000 mg | Freq: Once | INTRAMUSCULAR | Status: AC
  Administered 2016-08-28: 40 mg via INTRAVENOUS
  Filled 2016-08-28: qty 4

## 2016-08-28 MED ORDER — ENSURE ENLIVE PO LIQD
237.0000 mL | Freq: Three times a day (TID) | ORAL | Status: DC
  Administered 2016-08-29 – 2016-08-31 (×7): 237 mL via ORAL

## 2016-08-28 MED ORDER — DEXTROSE 5 % IV SOLN
500.0000 mg | Freq: Once | INTRAVENOUS | Status: AC
  Administered 2016-08-28: 500 mg via INTRAVENOUS
  Filled 2016-08-28: qty 500

## 2016-08-28 MED ORDER — GUAIFENESIN ER 600 MG PO TB12
600.0000 mg | ORAL_TABLET | Freq: Two times a day (BID) | ORAL | Status: DC
  Administered 2016-08-29 – 2016-08-31 (×6): 600 mg via ORAL
  Filled 2016-08-28 (×6): qty 1

## 2016-08-28 MED ORDER — ATORVASTATIN CALCIUM 40 MG PO TABS
40.0000 mg | ORAL_TABLET | Freq: Every day | ORAL | Status: DC
  Administered 2016-08-29 – 2016-08-30 (×2): 40 mg via ORAL
  Filled 2016-08-28 (×2): qty 1

## 2016-08-28 MED ORDER — ENOXAPARIN SODIUM 30 MG/0.3ML ~~LOC~~ SOLN
30.0000 mg | SUBCUTANEOUS | Status: DC
  Administered 2016-08-29 – 2016-08-30 (×3): 30 mg via SUBCUTANEOUS
  Filled 2016-08-28 (×3): qty 0.3

## 2016-08-28 MED ORDER — ASPIRIN 81 MG PO CHEW
81.0000 mg | CHEWABLE_TABLET | Freq: Every day | ORAL | Status: DC
  Administered 2016-08-29 – 2016-08-31 (×3): 81 mg via ORAL
  Filled 2016-08-28 (×3): qty 1

## 2016-08-28 MED ORDER — POTASSIUM CHLORIDE CRYS ER 20 MEQ PO TBCR
20.0000 meq | EXTENDED_RELEASE_TABLET | Freq: Two times a day (BID) | ORAL | Status: DC
  Administered 2016-08-29 – 2016-08-31 (×6): 20 meq via ORAL
  Filled 2016-08-28 (×6): qty 1

## 2016-08-28 MED ORDER — ACETAMINOPHEN 325 MG PO TABS: 650.0000 mg | ORAL_TABLET | ORAL | Status: DC | PRN

## 2016-08-28 MED ORDER — SODIUM CHLORIDE 0.9% FLUSH: 3.0000 mL | INTRAVENOUS | Status: DC | PRN

## 2016-08-28 MED ORDER — DEXTROSE 5 % IV SOLN
1.0000 g | Freq: Once | INTRAVENOUS | Status: AC
  Administered 2016-08-28: 1 g via INTRAVENOUS
  Filled 2016-08-28: qty 10

## 2016-08-28 MED ORDER — FUROSEMIDE 10 MG/ML IJ SOLN
40.0000 mg | Freq: Two times a day (BID) | INTRAMUSCULAR | Status: DC
  Administered 2016-08-29 – 2016-08-30 (×3): 40 mg via INTRAVENOUS
  Filled 2016-08-28 (×3): qty 4

## 2016-08-28 MED ORDER — SODIUM CHLORIDE 0.9% FLUSH
3.0000 mL | Freq: Two times a day (BID) | INTRAVENOUS | Status: DC
  Administered 2016-08-28 – 2016-08-31 (×6): 3 mL via INTRAVENOUS

## 2016-08-28 MED ORDER — ONDANSETRON HCL 4 MG/2ML IJ SOLN: 4.0000 mg | Freq: Four times a day (QID) | INTRAMUSCULAR | Status: DC | PRN

## 2016-08-28 MED ORDER — DEXTROSE 5 % IV SOLN
500.0000 mg | INTRAVENOUS | Status: DC
  Administered 2016-08-29 – 2016-08-30 (×2): 500 mg via INTRAVENOUS
  Filled 2016-08-28 (×3): qty 500

## 2016-08-28 MED ORDER — CARVEDILOL 3.125 MG PO TABS
3.1250 mg | ORAL_TABLET | Freq: Two times a day (BID) | ORAL | Status: DC
  Administered 2016-08-29 – 2016-08-31 (×6): 3.125 mg via ORAL
  Filled 2016-08-28 (×6): qty 1

## 2016-08-28 MED ORDER — SODIUM CHLORIDE 0.9 % IV SOLN: 250.0000 mL | INTRAVENOUS | Status: DC | PRN

## 2016-08-28 MED ORDER — LORAZEPAM 0.5 MG PO TABS
0.5000 mg | ORAL_TABLET | Freq: Once | ORAL | Status: AC
  Administered 2016-08-28: 0.5 mg via ORAL
  Filled 2016-08-28: qty 1

## 2016-08-28 NOTE — ED Notes (Signed)
Pt refuses to wear oxygen at this time.  Daughter states she is noncompliant with medications at home and will not see a pcp as she states "they just want to take your money."

## 2016-08-28 NOTE — ED Triage Notes (Signed)
Pt daughter reports np cough, fatigue and poor appetite x 1 week.

## 2016-08-28 NOTE — H&P (Signed)
History and Physical  Ruth Davidson NFA:[id number] DOB: 04/19/1941 DOA: 08/28/2016  Referring physician: , ED physician PCP: No PCP Per Patient Dr. Sherwood Gambler [facility] Specialists:   Dr Purvis Sheffield (Cardiology)  Chief Complaint: Fatigue, cough, shortness of breath  HPI: Ruth Davidson is a 75 y.o. 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 Patient presents at the insistence of her daughter due to increasing shortness of breath, fatigue, and cough that started about 6 days ago and has been worsening. The patient has been lying on the couch for most the day, where normally she is fairly active. She is dyspneic to several feet. Has white sputum production with cough over the past couple of days, but prior to this has been nonproductive. She admits to not taking her medications over the past month.  Course in emergency department: Chest x-ray was nonspecific. CTA shows mild pulmonary edema and right lower lobe infiltrate. Patient is maintaining saturations. Her BNP was possibly 2000.   Review of Systems:   Pt denies any fevers, chills, nausea, vomiting, diarrhea, constipation, abdominal pain, shortness of breath, dyspnea on exertion, orthopnea, cough, wheezing, palpitations, headache, vision changes, lightheadedness, dizziness, melena, rectal bleeding.  Review of systems are otherwise negative  Past Medical History:  Diagnosis Date  . MI (myocardial infarction)    History reviewed. No pertinent surgical history. Social History:  reports that she has been smoking Cigarettes.  She started smoking about 57 years ago. She has a 12.50 pack-year smoking history. She has never used smokeless tobacco. She reports that she does not drink alcohol or use drugs. Patient lives at Home  No Known Allergies  Family History  Problem Relation Age of Onset  . Hypertension Father     Died in war     Prior to Admission medications   Medication Sig Start Date End Date Taking? Authorizing Provider  furosemide (LASIX) 20 MG tablet Take 1 tablet (20 mg total) by mouth daily. 06/11/16 09/09/16 Yes Luke K Kilroy, PA-C  aspirin 81 MG chewable tablet Chew 1 tablet (81 mg total) by mouth daily. Patient not taking: Reported on 08/28/2016 04/18/16   Leana Roe Elgergawy, MD  carvedilol (COREG) 3.125 MG tablet Take 1 tablet (3.125 mg total) by mouth 2 (two) times daily with a meal. Patient not taking: Reported on 08/28/2016 04/18/16   Leana Roe Elgergawy, MD  feeding supplement, ENSURE ENLIVE, (ENSURE ENLIVE) LIQD Take 237 mLs by mouth 3 (three) times daily between meals. Patient not taking: Reported on 08/28/2016 04/18/16   Leana Roe Elgergawy, MD  nitroGLYCERIN (NITROSTAT) 0.4 MG SL tablet Place 1 tablet (0.4 mg total) under the tongue every 5 (five) minutes as needed for chest pain (or back pain or arm pain). 11/10/15   Dione Booze, MD  potassium chloride SA (K-DUR,KLOR-CON) 20 MEQ tablet Take 1 tablet (20 mEq total) by mouth daily. Patient not taking: Reported on 08/28/2016 06/08/16   Laqueta Linden, MD  simvastatin (ZOCOR) 80 MG tablet Take 1 tablet (80 mg total) by mouth at bedtime. Patient not taking: Reported on 08/28/2016 10/11/14   Laqueta Linden, MD    Physical Exam: BP 123/87   Pulse 83   Temp 98.1 F (36.7 C) (Oral)   Resp 24   Wt 41.3 kg (91 lb 1.6 oz)   SpO2 95%   BMI 14.70 kg/m   General: Older Caucasian female. Awake and alert and oriented x3. No acute cardiopulmonary distress.  HEENT: Normocephalic atraumatic.  Right and left ears normal in appearance.  Pupils equal, round, reactive to light. Extraocular muscles are intact. Sclerae anicteric and noninjected.  Moist mucosal membranes. No mucosal lesions.  Neck: Neck supple without lymphadenopathy. No carotid bruits. No masses palpated.  Cardiovascular: Regular rate with normal S1-S2 sounds. No murmurs, rubs, gallops  auscultated. Respiratory: Lung exhalation phase. Rales in the right lung base. No wheezing.  No accessory muscle use. Abdomen: Soft, nontender, nondistended. Active bowel sounds. No masses or hepatosplenomegaly  Skin: No rashes, lesions, or ulcerations.  Dry, warm to touch. 2+ dorsalis pedis and radial pulses. Musculoskeletal: No calf or leg pain. All major joints not erythematous nontender.  No upper or lower joint deformation.  Good ROM.  No contractures  Psychiatric: Intact judgment and insight. Pleasant and cooperative. Neurologic: No focal neurological deficits. Strength is 5/5 and symmetric in upper and lower extremities.  Cranial nerves II through XII are grossly intact.           Labs on Admission: I have personally reviewed following labs and imaging studies  CBC:  Recent Labs Lab 08/28/16 1556  WBC 8.7  NEUTROABS 6.6  HGB 16.0*  HCT 46.5*  MCV 98.7  PLT 170   Basic Metabolic Panel:  Recent Labs Lab 08/28/16 1556  NA 139  K 3.5  CL 107  CO2 26  GLUCOSE 113*  BUN 14  CREATININE 0.99  CALCIUM 8.9   GFR: Estimated Creatinine Clearance: 32.5 mL/min (by C-G formula based on SCr of 0.99 mg/dL). Liver Function Tests:  Recent Labs Lab 08/28/16 1556  AST 19  ALT 11*  ALKPHOS 81  BILITOT 0.8  PROT 5.8*  ALBUMIN 3.3*   No results for input(s): LIPASE, AMYLASE in the last 168 hours. No results for input(s): AMMONIA in the last 168 hours. Coagulation Profile: No results for input(s): INR, PROTIME in the last 168 hours. Cardiac Enzymes: No results for input(s): CKTOTAL, CKMB, CKMBINDEX, TROPONINI in the last 168 hours. BNP (last 3 results) No results for input(s): PROBNP in the last 8760 hours. HbA1C: No results for input(s): HGBA1C in the last 72 hours. CBG: No results for input(s): GLUCAP in the last 168 hours. Lipid Profile: No results for input(s): CHOL, HDL, LDLCALC, TRIG, CHOLHDL, LDLDIRECT in the last 72 hours. Thyroid Function Tests: No results  for input(s): TSH, T4TOTAL, FREET4, T3FREE, THYROIDAB in the last 72 hours. Anemia Panel: No results for input(s): VITAMINB12, FOLATE, FERRITIN, TIBC, IRON, RETICCTPCT in the last 72 hours. Urine analysis:    Component Value Date/Time   COLORURINE YELLOW 08/28/2016 1551   APPEARANCEUR CLEAR 08/28/2016 1551   LABSPEC >1.030 (H) 08/28/2016 1551   PHURINE 5.5 08/28/2016 1551   GLUCOSEU NEGATIVE 08/28/2016 1551   HGBUR LARGE (A) 08/28/2016 1551   BILIRUBINUR SMALL (A) 08/28/2016 1551   KETONESUR TRACE (A) 08/28/2016 1551   PROTEINUR 30 (A) 08/28/2016 1551   NITRITE NEGATIVE 08/28/2016 1551   LEUKOCYTESUR NEGATIVE 08/28/2016 1551   Sepsis Labs: @LABRCNTIP (procalcitonin:4,lacticidven:4) )No results found for this or any previous visit (from the past 240 hour(s)).   Radiological Exams on Admission: Dg Chest 2 View  Result Date: 08/28/2016 CLINICAL DATA:  Cough, generalized weakness EXAM: CHEST  2 VIEW COMPARISON:  04/14/2016 FINDINGS: Cardiomegaly again noted. Small bilateral pleural effusion with bilateral basilar atelectasis or infiltrate. No pulmonary edema. IMPRESSION: Cardiomegaly. Small bilateral pleural effusion with bilateral basilar atelectasis or infiltrate. No pulmonary edema. Electronically Signed   By: Natasha Mead M.D.   On: 08/28/2016  16:40   Ct Chest Wo Contrast  Result Date: 08/28/2016 CLINICAL DATA:  Cough and shortness of breath. Generalized weakness. EXAM: CT CHEST WITHOUT CONTRAST TECHNIQUE: Multidetector CT imaging of the chest was performed following the standard protocol without IV contrast. COMPARISON:  Chest radiograph from earlier today. 04/14/2016 chest CT angiogram. FINDINGS: Motion degraded scan. Cardiovascular: Mild cardiomegaly, stable. Trace pericardial effusion/ thickening, stable. Right coronary atherosclerosis. Atherosclerotic nonaneurysmal thoracic aorta. Normal caliber pulmonary arteries. Mediastinum/Nodes: Probable left hemithyroidectomy. Mild right-sided  multinodular goiter with dominant 1.5 cm hypodense anterior right thyroid lobe nodule, stable. Unremarkable esophagus. No axillary adenopathy. No pathologically enlarged mediastinal or gross hilar nodes on this noncontrast study. Lungs/Pleura: No pneumothorax. Small to moderate layering bilateral pleural effusions. Moderate centrilobular emphysema and mild diffuse bronchial wall thickening. Mild-to-moderate compressive atelectasis in the dependent lower lobes bilaterally. New mild patchy consolidation in the peripheral right lower lobe. Mild interlobular septal thickening throughout both lungs. No lung masses or significant pulmonary nodules in the aerated portions of the lungs. Upper abdomen: Cholecystectomy. Simple 2.1 cm renal cyst in the medial upper left kidney. Musculoskeletal: No aggressive appearing focal osseous lesions. Mild-to-moderate thoracic spondylosis. IMPRESSION: 1. Stable mild cardiomegaly. Mild interlobular septal thickening throughout both lungs suggesting mild pulmonary edema due to mild congestive heart failure. 2. Small to moderate layering bilateral pleural effusions with associated compressive bilateral lower lobe atelectasis. 3. New mild patchy consolidation in the peripheral right lower lobe, cannot exclude superimposed mild infectious or aspiration pneumonia. 4. Aortic atherosclerosis.  One vessel coronary atherosclerosis. Electronically Signed   By: Delbert PhenixJason A Poff M.D.   On: 08/28/2016 19:23    EKG: Independently reviewed. Sinus rhythm with LVH. No acute ST changes.  Assessment/Plan: Principal Problem:   CHF exacerbation (HCC) Active Problems:   Community acquired pneumonia   Congestive dilated cardiomyopathy (HCC)   Dementia    This patient was discussed with the ED physician, including pertinent vitals, physical exam findings, labs, and imaging.  We also discussed care given by the ED provider.  #1 CHF exacerbation  Admit to telemetry  I/Os  Daily weights  Lasix  40 mg IV twice daily  Echo tomorrow  Potassium replacement  BMP tomorrow #2 [place] acquired pneumonia  Azithromycin and ceftriaxone  Sputum cultures  Pro calcitonin  Legionella and strep antigens by urine #3 dementia  Will need frequent redirection #4 congestive dilated cardiomyopathy  Restarts beta blocker, aspirin, statin #5 medical noncompliance  Stress importance of medications  DVT prophylaxis: Lovenox Consultants: None Code Status: Full code Family Communication: Daughter in the room  Disposition Plan: Pending   Levie HeritageJacob J Aleczander Fandino, DO [facility] Pager [phone]  If 7PM-7AM, please contact night-coverage [email or web address] Password [email or web address]

## 2016-08-28 NOTE — ED Notes (Signed)
Pt stating she wants to leave.  Pt not making complete sense and keeps repeating self.  Daughter states she has some "sundowners." States she wants to go home and die.

## 2016-08-28 NOTE — ED Notes (Signed)
Pt currently states she wants to go home.  Daughter telling her she is not taking her home unless the doctor discharges her because she does not want her dying on her couch.  Pt states she can do everything we are doing here at home.

## 2016-08-28 NOTE — ED Provider Notes (Signed)
[facility]-EMERGENCY DEPT Provider Note   CSN: [id number] Arrival date & time: 08/28/16  1522     History   Chief Complaint Chief Complaint  Patient presents with  . Cough    HPI Ruth Davidson is a 75 y.o. female.  HPI 75 year old female who presents with generalized weakness, cough and shortness of breath. She has a history of CAD, systolic heart failure with EF 20%, hypertension, hyperlipidemia, and emphysema. Lives with her daughter at home, who provides most of the history. States that she has had progressively worsening generalized weakness and decreased appetite since 5-6 days ago. She has had associated nonproductive cough. Has associating shortness of breath, and they've noticed that she has needed to sleep upright in a chair over the past few days. No lower extremity edema, abdominal distention, dysuria or urinary frequency, fevers or chills, chest pain, nausea or vomiting, diarrhea, or known sick contacts.   Past Medical History:  Diagnosis Date  . MI (myocardial infarction)     Patient Active Problem List   Diagnosis Date Noted  . CHF exacerbation (HCC) 08/28/2016  . 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 Emphysema of lung (HCC) 06/11/2016  . 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 Dementia 06/11/2016  . NSVT (nonsustained ventricular tachycardia) (HCC) 04/15/2016  . Acute systolic CHF (congestive heart failure) (HCC) 04/15/2016  . Pneumonia 04/14/2016  . SIRS due to infectious process without acute organ dysfunction (HCC) 04/14/2016  . Community acquired pneumonia   . CAD S/P remote PCI 08/30/2013  . Hyperlipidemia 08/30/2013  . Tobacco abuse 08/30/2013    History reviewed. No pertinent surgical history.  OB History    No data available       Home Medications    Prior to Admission medications   Medication Sig Start Date End Date Taking? Authorizing Provider  furosemide (LASIX) 20 MG tablet Take 1 tablet (20 mg total) by mouth daily. 06/11/16  09/09/16 Yes Luke K Kilroy, PA-C  aspirin 81 MG chewable tablet Chew 1 tablet (81 mg total) by mouth daily. Patient not taking: Reported on 08/28/2016 04/18/16   Leana Roe Elgergawy, MD  carvedilol (COREG) 3.125 MG tablet Take 1 tablet (3.125 mg total) by mouth 2 (two) times daily with a meal. Patient not taking: Reported on 08/28/2016 04/18/16   Leana Roe Elgergawy, MD  feeding supplement, ENSURE ENLIVE, (ENSURE ENLIVE) LIQD Take 237 mLs by mouth 3 (three) times daily between meals. Patient not taking: Reported on 08/28/2016 04/18/16   Leana Roe Elgergawy, MD  nitroGLYCERIN (NITROSTAT) 0.4 MG SL tablet Place 1 tablet (0.4 mg total) under the tongue every 5 (five) minutes as needed for chest pain (or back pain or arm pain). 11/10/15   Dione Booze, MD  potassium chloride SA (K-DUR,KLOR-CON) 20 MEQ tablet Take 1 tablet (20 mEq total) by mouth daily. Patient not taking: Reported on 08/28/2016 06/08/16   Laqueta Linden, MD  simvastatin (ZOCOR) 80 MG tablet Take 1 tablet (80 mg total) by mouth at bedtime. Patient not taking: Reported on 08/28/2016 10/11/14   Laqueta Linden, MD    Family History Family History  Problem Relation Age of Onset  . Hypertension Father     Died in war    Social History Social History  Substance Use Topics  . Smoking status: Current Every Day Smoker    Packs/day: 0.25    Years: 50.00    Types: Cigarettes    Start date: 11/02/1958  . Smokeless tobacco: Never Used  . Alcohol use No  Allergies   Review of patient's allergies indicates no known allergies.   Review of Systems Review of Systems 10/14 systems reviewed and are negative other than those stated in the HPI   Physical Exam Updated Vital Signs BP 123/87   Pulse 83   Temp 98.1 F (36.7 C) (Oral)   Resp 24   Wt 91 lb 1.6 oz (41.3 kg)   SpO2 95%   BMI 14.70 kg/m   Physical Exam Physical Exam  Nursing note and vitals reviewed. Constitutional: Appears listless and weak, thin and  malnourished, non-toxic, and in no acute distress Head: Normocephalic and atraumatic.  Mouth/Throat: Oropharynx is clear. Mucous membranes dry. Neck: Normal range of motion. Neck supple.  Cardiovascular: Normal rate and regular rhythm. No LE edema  Pulmonary/Chest: Effort normal and breath sounds normal.  Abdominal: Soft. There is no tenderness. There is no rebound and no guarding.  Musculoskeletal: Normal range of motion.  Neurological: Alert, no facial droop, fluent speech, moves all extremities symmetrically Skin: Skin is warm and dry.  Psychiatric: Cooperative   ED Treatments / Results  Labs (all labs ordered are listed, but only abnormal results are displayed) Labs Reviewed  CBC WITH DIFFERENTIAL/PLATELET - Abnormal; Notable for the following:       Result Value   Hemoglobin 16.0 (*)    HCT 46.5 (*)    All other components within normal limits  COMPREHENSIVE METABOLIC PANEL - Abnormal; Notable for the following:    Glucose, Bld 113 (*)    Total Protein 5.8 (*)    Albumin 3.3 (*)    ALT 11 (*)    GFR calc non Af Amer 55 (*)    All other components within normal limits  BRAIN NATRIURETIC PEPTIDE - Abnormal; Notable for the following:    B Natriuretic Peptide 2,410.0 (*)    All other components within normal limits  URINALYSIS, ROUTINE W REFLEX MICROSCOPIC (NOT AT [facility]) - Abnormal; Notable for the following:    Specific Gravity, Urine >1.030 (*)    Hgb urine dipstick LARGE (*)    Bilirubin Urine SMALL (*)    Ketones, ur TRACE (*)    Protein, ur 30 (*)    All other components within normal limits  URINE MICROSCOPIC-ADD ON - Abnormal; Notable for the following:    Squamous Epithelial / LPF 6-30 (*)    Bacteria, UA MANY (*)    All other components within normal limits  INFLUENZA PANEL BY PCR (TYPE A & B, H1N1)  I-STAT TROPOININ, ED    EKG  EKG Interpretation  Date/Time:  Friday August 28 2016 15:55:11 EDT Ventricular Rate:  89 PR Interval:    QRS Duration: 99 QT  Interval:  406 QTC Calculation: 494 R Axis:   15 Text Interpretation:  Sinus rhythm Probable left atrial enlargement Borderline low voltage, extremity leads LVH with secondary repolarization abnormality Anterior Q waves, possibly due to LVH Similar to last EKG  Confirmed by Folasade Mooty MD, Ahijah Devery [id number]) on 08/28/2016 4:31:10 PM       Radiology Dg Chest 2 View  Result Date: 08/28/2016 CLINICAL DATA:  Cough, generalized weakness EXAM: CHEST  2 VIEW COMPARISON:  04/14/2016 FINDINGS: Cardiomegaly again noted. Small bilateral pleural effusion with bilateral basilar atelectasis or infiltrate. No pulmonary edema. IMPRESSION: Cardiomegaly. Small bilateral pleural effusion with bilateral basilar atelectasis or infiltrate. No pulmonary edema. Electronically Signed   By: Natasha Mead M.D.   On: 08/28/2016 16:40   Ct Chest Wo Contrast  Result Date: 08/28/2016 CLINICAL DATA:  Cough and shortness of breath. Generalized weakness. EXAM: CT CHEST WITHOUT CONTRAST TECHNIQUE: Multidetector CT imaging of the chest was performed following the standard protocol without IV contrast. COMPARISON:  Chest radiograph from earlier today. 04/14/2016 chest CT angiogram. FINDINGS: Motion degraded scan. Cardiovascular: Mild cardiomegaly, stable. Trace pericardial effusion/ thickening, stable. Right coronary atherosclerosis. Atherosclerotic nonaneurysmal thoracic aorta. Normal caliber pulmonary arteries. Mediastinum/Nodes: Probable left hemithyroidectomy. Mild right-sided multinodular goiter with dominant 1.5 cm hypodense anterior right thyroid lobe nodule, stable. Unremarkable esophagus. No axillary adenopathy. No pathologically enlarged mediastinal or gross hilar nodes on this noncontrast study. Lungs/Pleura: No pneumothorax. Small to moderate layering bilateral pleural effusions. Moderate centrilobular emphysema and mild diffuse bronchial wall thickening. Mild-to-moderate compressive atelectasis in the dependent lower lobes bilaterally. New  mild patchy consolidation in the peripheral right lower lobe. Mild interlobular septal thickening throughout both lungs. No lung masses or significant pulmonary nodules in the aerated portions of the lungs. Upper abdomen: Cholecystectomy. Simple 2.1 cm renal cyst in the medial upper left kidney. Musculoskeletal: No aggressive appearing focal osseous lesions. Mild-to-moderate thoracic spondylosis. IMPRESSION: 1. Stable mild cardiomegaly. Mild interlobular septal thickening throughout both lungs suggesting mild pulmonary edema due to mild congestive heart failure. 2. Small to moderate layering bilateral pleural effusions with associated compressive bilateral lower lobe atelectasis. 3. New mild patchy consolidation in the peripheral right lower lobe, cannot exclude superimposed mild infectious or aspiration pneumonia. 4. Aortic atherosclerosis.  One vessel coronary atherosclerosis. Electronically Signed   By: Delbert PhenixJason A Poff M.D.   On: 08/28/2016 19:23    Procedures Procedures (including critical care time)  Medications Ordered in ED Medications  furosemide (LASIX) injection 40 mg (not administered)  cefTRIAXone (ROCEPHIN) 1 g in dextrose 5 % 50 mL IVPB (not administered)  azithromycin (ZITHROMAX) 500 mg in dextrose 5 % 250 mL IVPB (not administered)  LORazepam (ATIVAN) tablet 0.5 mg (0.5 mg Oral Given 08/28/16 1757)     Initial Impression / Assessment and Plan / ED Course  I have reviewed the triage vital signs and the nursing notes.  Pertinent labs & imaging results that were available during my care of the patient were reviewed by me and considered in my medical decision making (see chart for details).  Clinical Course    75 year old female who presents with several days of cough and shortness of breath. Is in no acute distress on presentation, breathing comfortably on room air with normal oxygenation condition. Does not overtly look fluid overloaded but BNP significantly elevated above 2000. BNP  on chart review was 1000 during recent admission in June for CHF exacerbation. She has a normal troponin, no acute ischemic changes on her EKG. Infectious workup revealing no major leukocytosis or endorgan damage. Chest x-ray showing cardiomegaly but otherwise no acute cardiopulmonary processes aside from some small bilateral pleural effusion. CT chest performed, visualized and shows evidence of pulmonary edema as well as infiltrate in the right lower lobe that could be suggestive of pneumonia. She is covered with ceftriaxone and azithromycin for potential community-acquired pneumonia. I will diuresis with 40 mg of IV Lasix. Discussed with Dr. Adrian BlackwaterStinson who will admit for ongoing management.  Final Clinical Impressions(s) / ED Diagnoses   Final diagnoses:  Community acquired pneumonia of right lower lobe of lung (HCC)  Acute on chronic systolic congestive heart failure [facility](HCC)    New Prescriptions New Prescriptions   No medications on file     Lavera Guiseana Duo Dolph Tavano, MD 08/28/16 2015

## 2016-08-28 NOTE — ED Notes (Signed)
hospitalist has evaluated

## 2016-08-28 NOTE — ED Notes (Signed)
Pt is resting but opens eyes when this RN enters. She is pleasantly confused and reports that she wants to go home and watch TV- TV is on in her room.   She is awaiting disposition

## 2016-08-28 NOTE — ED Notes (Signed)
Pt willingly went to CT.

## 2016-08-28 NOTE — ED Notes (Signed)
Report to Marisa, RN 

## 2016-08-29 ENCOUNTER — Inpatient Hospital Stay (HOSPITAL_COMMUNITY): Payer: Medicare Other

## 2016-08-29 DIAGNOSIS — I5023 Acute on chronic systolic (congestive) heart failure: Secondary | ICD-10-CM

## 2016-08-29 DIAGNOSIS — I509 Heart failure, unspecified: Secondary | ICD-10-CM

## 2016-08-29 LAB — BASIC METABOLIC PANEL
ANION GAP: 9 (ref 5–15)
BUN: 15 mg/dL (ref 6–20)
CALCIUM: 8.6 mg/dL — AB (ref 8.9–10.3)
CO2: 27 mmol/L (ref 22–32)
Chloride: 105 mmol/L (ref 101–111)
Creatinine, Ser: 0.99 mg/dL (ref 0.44–1.00)
GFR, EST NON AFRICAN AMERICAN: 55 mL/min — AB (ref >60)
Glucose, Bld: 97 mg/dL (ref 65–99)
POTASSIUM: 3.6 mmol/L (ref 3.5–5.1)
Sodium: 141 mmol/L (ref 135–145)

## 2016-08-29 LAB — ECHOCARDIOGRAM COMPLETE
CHL CUP MV DEC (S): 144
E decel time: 144 msec
E/e' ratio: 18.12
FS: 8 % — AB (ref 28–44)
IV/PV OW: 0.88
LA ID, A-P, ES: 35 mm
LA vol: 56.4 mL
LADIAMINDEX: 2.48 cm/m2
LAVOLA4C: 43.5 mL
LAVOLIN: 40 mL/m2
LEFT ATRIUM END SYS DIAM: 35 mm
LV E/e' medial: 18.12
LV PW d: 12.5 mm — AB (ref 0.6–1.1)
LV TDI E'LATERAL: 4.47
LV TDI E'MEDIAL: 2.61
LVEEAVG: 18.12
LVELAT: 4.47 cm/s
MV pk A vel: 81.5 m/s
MV pk E vel: 81 m/s
MVPG: 3 mmHg
TAPSE: 13.7 mm
Weight: 1411.2 oz

## 2016-08-29 MED ORDER — PERFLUTREN LIPID MICROSPHERE
1.0000 mL | INTRAVENOUS | Status: AC | PRN
  Administered 2016-08-29: 2 mL via INTRAVENOUS
  Filled 2016-08-29: qty 10

## 2016-08-29 NOTE — Progress Notes (Signed)
PROGRESS NOTE  Ruth Davidson  ZOX:[id number] DOB: 1941/01/21 DOA: 08/28/2016 PCP: No PCP Per Patient Outpatient Specialists:  Subjective: Reported shortness of breath, cough and feeling weak  Brief Narrative:  Ruth Davidson is a 75 y.o. 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 Patient presents at the insistence of her daughter due to increasing shortness of breath, fatigue, and cough that started about 6 days ago and has been worsening. The patient has been lying on the couch for most the day, where normally she is fairly active. She is dyspneic to several feet. Has white sputum production with cough over the past couple of days, but prior to this has been nonproductive. She admits to not taking her medications over the past month.  Assessment & Plan:   Principal Problem:   CHF exacerbation (HCC) Active Problems:   Community acquired pneumonia   Congestive dilated cardiomyopathy (HCC)   Dementia   Noncompliance   Acute on chronic systolic CHF Presented with shortness of breath, CXR showed pulmonary edema and pleural effusion, BNP is 2400. -History of chronic systolic CHF with LVEF of 20%. -Started on Lasix 40 mg twice a day. -Follow renal function, electrolytes, daily weights. -Restrict fluid intake to 1500 mL/day and salt to less than 2 g/day.  Community acquired pneumonia -Chest x-ray suggests increased acquired pneumonia versus aspiration. -No leukocytosis or fever but x-ray findings concerning enough, continue antibiotics. -Continue supportive management with bronchodilators, mucolytics, antitussives and oxygen as needed.  Dementia -Patient is awake, alert and oriented 2 not to the date. -High risk for sundowning and acute delirium at night.  Congestive dilated cardiomyopathy -Continue home medications including Coreg,  Medical noncompliance -Stress  importance of medications    DVT prophylaxis: Lovenox Code Status: Full Code Family Communication:  Disposition Plan:  Diet: Diet Heart Room service appropriate? Yes; Fluid consistency: Thin  Consultants:   None  Procedures:   2-D echo, results pending  Antimicrobials:   Rocephin and Zithromax  Objective: Vitals:   08/28/16 2300 08/29/16 0014 08/29/16 0621 08/29/16 0759  BP: (!) 86/73 102/74 (!) 102/57   Pulse: 95  89   Resp: 17  18   Temp: 98.2 F (36.8 C)  97.6 F (36.4 C)   TempSrc: Oral  Oral   SpO2: 93%  95% 94%  Weight:   40 kg (88 lb 3.2 oz)     Intake/Output Summary (Last 24 hours) at 08/29/16 1056 Last data filed at 08/29/16 0900  Gross per 24 hour  Intake              240 ml  Output                0 ml  Net              240 ml   Filed Weights   08/28/16 1528 08/29/16 0621  Weight: 41.3 kg (91 lb 1.6 oz) 40 kg (88 lb 3.2 oz)    Examination: General exam: Appears calm and comfortable  Respiratory system: Clear to auscultation. Respiratory effort normal. Cardiovascular system: S1 & S2 heard, RRR. No JVD, murmurs, rubs, gallops or clicks. No pedal edema. Gastrointestinal system: Abdomen is nondistended, soft and nontender. No organomegaly or masses felt. Normal bowel sounds heard. Central nervous system: Alert and oriented. No focal neurological deficits. Extremities: Symmetric 5 x 5 power. Skin: No rashes, lesions or ulcers Psychiatry: Judgement and insight appear normal. Mood & affect  appropriate.   Data Reviewed: I have personally reviewed following labs and imaging studies  CBC:  Recent Labs Lab 08/28/16 1556  WBC 8.7  NEUTROABS 6.6  HGB 16.0*  HCT 46.5*  MCV 98.7  PLT 170   Basic Metabolic Panel:  Recent Labs Lab 08/28/16 1556  NA 139  K 3.5  CL 107  CO2 26  GLUCOSE 113*  BUN 14  CREATININE 0.99  CALCIUM 8.9   GFR: Estimated Creatinine Clearance: 31.5 mL/min (by C-G formula based on SCr of 0.99 mg/dL). Liver Function  Tests:  Recent Labs Lab 08/28/16 1556  AST 19  ALT 11*  ALKPHOS 81  BILITOT 0.8  PROT 5.8*  ALBUMIN 3.3*   No results for input(s): LIPASE, AMYLASE in the last 168 hours. No results for input(s): AMMONIA in the last 168 hours. Coagulation Profile: No results for input(s): INR, PROTIME in the last 168 hours. Cardiac Enzymes: No results for input(s): CKTOTAL, CKMB, CKMBINDEX, TROPONINI in the last 168 hours. BNP (last 3 results) No results for input(s): PROBNP in the last 8760 hours. HbA1C: No results for input(s): HGBA1C in the last 72 hours. CBG: No results for input(s): GLUCAP in the last 168 hours. Lipid Profile: No results for input(s): CHOL, HDL, LDLCALC, TRIG, CHOLHDL, LDLDIRECT in the last 72 hours. Thyroid Function Tests: No results for input(s): TSH, T4TOTAL, FREET4, T3FREE, THYROIDAB in the last 72 hours. Anemia Panel: No results for input(s): VITAMINB12, FOLATE, FERRITIN, TIBC, IRON, RETICCTPCT in the last 72 hours. Urine analysis:    Component Value Date/Time   COLORURINE YELLOW 08/28/2016 1551   APPEARANCEUR CLEAR 08/28/2016 1551   LABSPEC >1.030 (H) 08/28/2016 1551   PHURINE 5.5 08/28/2016 1551   GLUCOSEU NEGATIVE 08/28/2016 1551   HGBUR LARGE (A) 08/28/2016 1551   BILIRUBINUR SMALL (A) 08/28/2016 1551   KETONESUR TRACE (A) 08/28/2016 1551   PROTEINUR 30 (A) 08/28/2016 1551   NITRITE NEGATIVE 08/28/2016 1551   LEUKOCYTESUR NEGATIVE 08/28/2016 1551   Sepsis Labs: @LABRCNTIP (procalcitonin:4,lacticidven:4)  )No results found for this or any previous visit (from the past 240 hour(s)).   Invalid input(s): PROCALCITONIN, LACTICACIDVEN   Radiology Studies: Dg Chest 2 View  Result Date: 08/28/2016 CLINICAL DATA:  Cough, generalized weakness EXAM: CHEST  2 VIEW COMPARISON:  04/14/2016 FINDINGS: Cardiomegaly again noted. Small bilateral pleural effusion with bilateral basilar atelectasis or infiltrate. No pulmonary edema. IMPRESSION: Cardiomegaly. Small  bilateral pleural effusion with bilateral basilar atelectasis or infiltrate. No pulmonary edema. Electronically Signed   By: Natasha Mead M.D.   On: 08/28/2016 16:40   Ct Chest Wo Contrast  Result Date: 08/28/2016 CLINICAL DATA:  Cough and shortness of breath. Generalized weakness. EXAM: CT CHEST WITHOUT CONTRAST TECHNIQUE: Multidetector CT imaging of the chest was performed following the standard protocol without IV contrast. COMPARISON:  Chest radiograph from earlier today. 04/14/2016 chest CT angiogram. FINDINGS: Motion degraded scan. Cardiovascular: Mild cardiomegaly, stable. Trace pericardial effusion/ thickening, stable. Right coronary atherosclerosis. Atherosclerotic nonaneurysmal thoracic aorta. Normal caliber pulmonary arteries. Mediastinum/Nodes: Probable left hemithyroidectomy. Mild right-sided multinodular goiter with dominant 1.5 cm hypodense anterior right thyroid lobe nodule, stable. Unremarkable esophagus. No axillary adenopathy. No pathologically enlarged mediastinal or gross hilar nodes on this noncontrast study. Lungs/Pleura: No pneumothorax. Small to moderate layering bilateral pleural effusions. Moderate centrilobular emphysema and mild diffuse bronchial wall thickening. Mild-to-moderate compressive atelectasis in the dependent lower lobes bilaterally. New mild patchy consolidation in the peripheral right lower lobe. Mild interlobular septal thickening throughout both lungs. No lung masses or significant pulmonary nodules  in the aerated portions of the lungs. Upper abdomen: Cholecystectomy. Simple 2.1 cm renal cyst in the medial upper left kidney. Musculoskeletal: No aggressive appearing focal osseous lesions. Mild-to-moderate thoracic spondylosis. IMPRESSION: 1. Stable mild cardiomegaly. Mild interlobular septal thickening throughout both lungs suggesting mild pulmonary edema due to mild congestive heart failure. 2. Small to moderate layering bilateral pleural effusions with associated  compressive bilateral lower lobe atelectasis. 3. New mild patchy consolidation in the peripheral right lower lobe, cannot exclude superimposed mild infectious or aspiration pneumonia. 4. Aortic atherosclerosis.  One vessel coronary atherosclerosis. Electronically Signed   By: Delbert PhenixJason A Poff M.D.   On: 08/28/2016 19:23        Scheduled Meds: . aspirin  81 mg Oral Daily  . atorvastatin  40 mg Oral q1800  . azithromycin  500 mg Intravenous Q24H  . carvedilol  3.125 mg Oral BID WC  . cefTRIAXone (ROCEPHIN)  IV  1 g Intravenous Q24H  . enoxaparin (LOVENOX) injection  30 mg Subcutaneous Q24H  . feeding supplement (ENSURE ENLIVE)  237 mL Oral TID BM  . furosemide  40 mg Intravenous BID  . guaiFENesin  600 mg Oral BID  . potassium chloride  20 mEq Oral BID  . sodium chloride flush  3 mL Intravenous Q12H   Continuous Infusions:    LOS: 1 day    Time spent: 35 minutes    Filomena Pokorney A, MD [facility] Pager [phone]  If 7PM-7AM, please contact night-coverage [email or web address] Password [email or web address] 08/29/2016, 10:56 AM

## 2016-08-29 NOTE — Progress Notes (Signed)
*  PRELIMINARY RESULTS* Echocardiogram 2D Echocardiogram with definity has been performed.  Ruth Davidson, Ruth Davidson 08/29/2016, 10:06 AM

## 2016-08-30 LAB — BASIC METABOLIC PANEL
Anion gap: 9 (ref 5–15)
BUN: 20 mg/dL (ref 6–20)
CALCIUM: 9.2 mg/dL (ref 8.9–10.3)
CHLORIDE: 103 mmol/L (ref 101–111)
CO2: 28 mmol/L (ref 22–32)
CREATININE: 1.21 mg/dL — AB (ref 0.44–1.00)
GFR calc Af Amer: 50 mL/min — ABNORMAL LOW (ref >60)
GFR calc non Af Amer: 43 mL/min — ABNORMAL LOW (ref >60)
GLUCOSE: 80 mg/dL (ref 65–99)
Potassium: 3.8 mmol/L (ref 3.5–5.1)
Sodium: 140 mmol/L (ref 135–145)

## 2016-08-30 LAB — PROCALCITONIN: Procalcitonin: <0.1 ng/mL

## 2016-08-30 MED ORDER — FUROSEMIDE 40 MG PO TABS
40.0000 mg | ORAL_TABLET | Freq: Every day | ORAL | Status: DC
  Administered 2016-08-31: 40 mg via ORAL
  Filled 2016-08-30: qty 1

## 2016-08-30 MED ORDER — DIPHENHYDRAMINE HCL 50 MG/ML IJ SOLN
12.5000 mg | Freq: Four times a day (QID) | INTRAMUSCULAR | Status: DC | PRN
  Administered 2016-08-30: 25 mg via INTRAVENOUS
  Filled 2016-08-30: qty 1

## 2016-08-30 NOTE — Progress Notes (Signed)
PROGRESS NOTE  Ruth Davidson  ZOX:[id number] DOB: 06/20/1941 DOA: 08/28/2016 PCP: No PCP Per Patient Outpatient Specialists:  Subjective: Still feeling weak and short of breath, but definitely better than the day of admission.  Brief Narrative:  Ruth Davidson is a 75 y.o. 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 Patient presents at the insistence of her daughter due to increasing shortness of breath, fatigue, and cough that started about 6 days ago and has been worsening. The patient has been lying on the couch for most the day, where normally she is fairly active. She is dyspneic to several feet. Has white sputum production with cough over the past couple of days, but prior to this has been nonproductive. She admits to not taking her medications over the past month.  Assessment & Plan:   Principal Problem:   Acute on chronic systolic CHF (congestive heart failure) (HCC) Active Problems:   Community acquired pneumonia   Congestive dilated cardiomyopathy (HCC)   Dementia   Noncompliance   Acute on chronic systolic CHF Presented with shortness of breath, CXR showed pulmonary edema and pleural effusion, BNP is 2400. -History of chronic systolic CHF with LVEF of 20%. -Follow renal function, electrolytes, daily weights. -Restrict fluid intake to 1500 mL/day and salt to less than 2 g/day. -Was on Lasix 40 mg twice a day, creatinine is increasing, will switch to oral Lasix.  Community acquired pneumonia -Chest x-ray suggests increased acquired pneumonia versus aspiration. -No leukocytosis or fever but x-ray findings concerning enough, continue antibiotics. -Continue supportive management with bronchodilators, mucolytics, antitussives and oxygen as needed.  Dementia -Patient is awake, alert and oriented 2 not to the date. -High risk for sundowning and acute delirium at  night.  Congestive dilated cardiomyopathy -Continue home medications including Coreg, seen by cardiology as outpatient. -I will refer to cardiology as outpatient.  Medical noncompliance -Stress importance of medications    DVT prophylaxis: Lovenox Code Status: Full Code Family Communication:  Disposition Plan:  Diet: Diet Heart Room service appropriate? Yes; Fluid consistency: Thin  Consultants:   None  Procedures:   2-D echo, results pending  Antimicrobials:   Rocephin and Zithromax  Objective: Vitals:   08/29/16 1500 08/29/16 2036 08/30/16 0500 08/30/16 0723  BP: (!) 133/49 109/68 107/65 108/67  Pulse: 93 71 87 77  Resp: 18 18 20    Temp: 98.7 F (37.1 C) 97.8 F (36.6 C) 98.2 F (36.8 C)   TempSrc: Oral Oral Oral   SpO2: 98% 93% 95%   Weight:   38.7 kg (85 lb 4.8 oz)     Intake/Output Summary (Last 24 hours) at 08/30/16 1046 Last data filed at 08/30/16 0900  Gross per 24 hour  Intake              780 ml  Output                0 ml  Net              780 ml   Filed Weights   08/28/16 1528 08/29/16 0621 08/30/16 0500  Weight: 41.3 kg (91 lb 1.6 oz) 40 kg (88 lb 3.2 oz) 38.7 kg (85 lb 4.8 oz)    Examination: General exam: Appears calm and comfortable  Respiratory system: Clear to auscultation. Respiratory effort normal. Cardiovascular system: S1 & S2 heard, RRR. No JVD, murmurs, rubs, gallops or clicks. No pedal edema. Gastrointestinal system: Abdomen is nondistended,  soft and nontender. No organomegaly or masses felt. Normal bowel sounds heard. Central nervous system: Alert and oriented. No focal neurological deficits. Extremities: Symmetric 5 x 5 power. Skin: No rashes, lesions or ulcers Psychiatry: Judgement and insight appear normal. Mood & affect appropriate.   Data Reviewed: I have personally reviewed following labs and imaging studies  CBC:  Recent Labs Lab 08/28/16 1556  WBC 8.7  NEUTROABS 6.6  HGB 16.0*  HCT 46.5*  MCV 98.7  PLT  170   Basic Metabolic Panel:  Recent Labs Lab 08/28/16 1556 08/29/16 0559 08/30/16 0511  NA 139 141 140  K 3.5 3.6 3.8  CL 107 105 103  CO2 26 27 28   GLUCOSE 113* 97 80  BUN 14 15 20   CREATININE 0.99 0.99 1.21*  CALCIUM 8.9 8.6* 9.2   GFR: Estimated Creatinine Clearance: 24.9 mL/min (by C-G formula based on SCr of 1.21 mg/dL (H)). Liver Function Tests:  Recent Labs Lab 08/28/16 1556  AST 19  ALT 11*  ALKPHOS 81  BILITOT 0.8  PROT 5.8*  ALBUMIN 3.3*   No results for input(s): LIPASE, AMYLASE in the last 168 hours. No results for input(s): AMMONIA in the last 168 hours. Coagulation Profile: No results for input(s): INR, PROTIME in the last 168 hours. Cardiac Enzymes: No results for input(s): CKTOTAL, CKMB, CKMBINDEX, TROPONINI in the last 168 hours. BNP (last 3 results) No results for input(s): PROBNP in the last 8760 hours. HbA1C: No results for input(s): HGBA1C in the last 72 hours. CBG: No results for input(s): GLUCAP in the last 168 hours. Lipid Profile: No results for input(s): CHOL, HDL, LDLCALC, TRIG, CHOLHDL, LDLDIRECT in the last 72 hours. Thyroid Function Tests: No results for input(s): TSH, T4TOTAL, FREET4, T3FREE, THYROIDAB in the last 72 hours. Anemia Panel: No results for input(s): VITAMINB12, FOLATE, FERRITIN, TIBC, IRON, RETICCTPCT in the last 72 hours. Urine analysis:    Component Value Date/Time   COLORURINE YELLOW 08/28/2016 1551   APPEARANCEUR CLEAR 08/28/2016 1551   LABSPEC >1.030 (H) 08/28/2016 1551   PHURINE 5.5 08/28/2016 1551   GLUCOSEU NEGATIVE 08/28/2016 1551   HGBUR LARGE (A) 08/28/2016 1551   BILIRUBINUR SMALL (A) 08/28/2016 1551   KETONESUR TRACE (A) 08/28/2016 1551   PROTEINUR 30 (A) 08/28/2016 1551   NITRITE NEGATIVE 08/28/2016 1551   LEUKOCYTESUR NEGATIVE 08/28/2016 1551   Sepsis Labs: @LABRCNTIP (procalcitonin:4,lacticidven:4)  )No results found for this or any previous visit (from the past 240 hour(s)).   Invalid  input(s): PROCALCITONIN, LACTICACIDVEN   Radiology Studies: Dg Chest 2 View  Result Date: 08/28/2016 CLINICAL DATA:  Cough, generalized weakness EXAM: CHEST  2 VIEW COMPARISON:  04/14/2016 FINDINGS: Cardiomegaly again noted. Small bilateral pleural effusion with bilateral basilar atelectasis or infiltrate. No pulmonary edema. IMPRESSION: Cardiomegaly. Small bilateral pleural effusion with bilateral basilar atelectasis or infiltrate. No pulmonary edema. Electronically Signed   By: Natasha MeadLiviu  Pop M.D.   On: 08/28/2016 16:40   Ct Chest Wo Contrast  Result Date: 08/28/2016 CLINICAL DATA:  Cough and shortness of breath. Generalized weakness. EXAM: CT CHEST WITHOUT CONTRAST TECHNIQUE: Multidetector CT imaging of the chest was performed following the standard protocol without IV contrast. COMPARISON:  Chest radiograph from earlier today. 04/14/2016 chest CT angiogram. FINDINGS: Motion degraded scan. Cardiovascular: Mild cardiomegaly, stable. Trace pericardial effusion/ thickening, stable. Right coronary atherosclerosis. Atherosclerotic nonaneurysmal thoracic aorta. Normal caliber pulmonary arteries. Mediastinum/Nodes: Probable left hemithyroidectomy. Mild right-sided multinodular goiter with dominant 1.5 cm hypodense anterior right thyroid lobe nodule, stable. Unremarkable esophagus. No axillary adenopathy.  No pathologically enlarged mediastinal or gross hilar nodes on this noncontrast study. Lungs/Pleura: No pneumothorax. Small to moderate layering bilateral pleural effusions. Moderate centrilobular emphysema and mild diffuse bronchial wall thickening. Mild-to-moderate compressive atelectasis in the dependent lower lobes bilaterally. New mild patchy consolidation in the peripheral right lower lobe. Mild interlobular septal thickening throughout both lungs. No lung masses or significant pulmonary nodules in the aerated portions of the lungs. Upper abdomen: Cholecystectomy. Simple 2.1 cm renal cyst in the medial  upper left kidney. Musculoskeletal: No aggressive appearing focal osseous lesions. Mild-to-moderate thoracic spondylosis. IMPRESSION: 1. Stable mild cardiomegaly. Mild interlobular septal thickening throughout both lungs suggesting mild pulmonary edema due to mild congestive heart failure. 2. Small to moderate layering bilateral pleural effusions with associated compressive bilateral lower lobe atelectasis. 3. New mild patchy consolidation in the peripheral right lower lobe, cannot exclude superimposed mild infectious or aspiration pneumonia. 4. Aortic atherosclerosis.  One vessel coronary atherosclerosis. Electronically Signed   By: Delbert PhenixJason A Poff M.D.   On: 08/28/2016 19:23        Scheduled Meds: . aspirin  81 mg Oral Daily  . atorvastatin  40 mg Oral q1800  . azithromycin  500 mg Intravenous Q24H  . carvedilol  3.125 mg Oral BID WC  . cefTRIAXone (ROCEPHIN)  IV  1 g Intravenous Q24H  . enoxaparin (LOVENOX) injection  30 mg Subcutaneous Q24H  . feeding supplement (ENSURE ENLIVE)  237 mL Oral TID BM  . furosemide  40 mg Intravenous BID  . guaiFENesin  600 mg Oral BID  . potassium chloride  20 mEq Oral BID  . sodium chloride flush  3 mL Intravenous Q12H   Continuous Infusions:    LOS: 2 days    Time spent: 35 minutes    Jaykwon Morones A, MD [facility] Pager [phone]  If 7PM-7AM, please contact night-coverage [email or web address] Password [email or web address] 08/30/2016, 10:46 AM

## 2016-08-31 LAB — BASIC METABOLIC PANEL
Anion gap: 9 (ref 5–15)
BUN: 27 mg/dL — AB (ref 6–20)
CALCIUM: 9 mg/dL (ref 8.9–10.3)
CO2: 28 mmol/L (ref 22–32)
Chloride: 102 mmol/L (ref 101–111)
Creatinine, Ser: 1.19 mg/dL — ABNORMAL HIGH (ref 0.44–1.00)
GFR calc Af Amer: 51 mL/min — ABNORMAL LOW (ref >60)
GFR, EST NON AFRICAN AMERICAN: 44 mL/min — AB (ref >60)
GLUCOSE: 90 mg/dL (ref 65–99)
Potassium: 4.3 mmol/L (ref 3.5–5.1)
SODIUM: 139 mmol/L (ref 135–145)

## 2016-08-31 MED ORDER — FUROSEMIDE 20 MG PO TABS: 20.0000 mg | ORAL_TABLET | Freq: Every day | ORAL | 3 refills | Status: DC

## 2016-08-31 NOTE — Progress Notes (Signed)
At 12:20 pt given discharge instructions per LPN Marciano SequinFoote, NT wheeled pt off floor for discharge.

## 2016-08-31 NOTE — Discharge Summary (Signed)
Physician Discharge Summary  Ruth Davidson ZOX:[id number] DOB: 1941/04/07 DOA: 08/28/2016  PCP: No PCP Per Patient  Admit date: 08/28/2016 Discharge date: 08/31/2016  Admitted From: Home Disposition: Home  Recommendations for Outpatient Follow-up:  1. Follow up with PCP in 1-2 weeks 2. Please obtain BMP/CBC in one week 3. Lasix refilled  Home Health: NA Equipment/Devices:NA  Discharge Condition: Stable CODE STATUS: Full Code Diet recommendation: Diet Heart Room service appropriate? Yes; Fluid consistency: Thin Diet - low sodium heart healthy  Brief/Interim Summary: Ruth Davidson. Patient presents at the insistence of her daughter due to increasing shortness of breath, fatigue, and cough that started about 6 days ago and has been worsening. The patient has been lying on the couch for most the day, where normally she is fairly active. She is dyspneic to several feet. Has white sputum production with cough over the past couple of days, but prior to this has been nonproductive. She admits to not taking her medications over the past month.  Discharge Diagnoses:  Principal Problem:   Acute on chronic systolic CHF (congestive heart failure) (HCC) Active Problems:   Community acquired pneumonia   Congestive dilated cardiomyopathy (HCC)   Dementia   Noncompliance   Acute on chronic systolic CHF Presented with shortness of breath, CXR showed pulmonary edema and pleural effusion, BNP is 2400. -LVEF was 20% in June 2017, LVEF dropped down to 15%. Patient reported that she ran out of her Lasix at home. -Treated with IV Lasix, symptoms resolved. -Fluid intake restricted to 1.5 L/day and salt to less than 2 g/day. -Presented with weight of 91 pounds, discharge weight is 84 pounds.  Community acquired pneumonia -Chest  x-ray suggests increased acquired pneumonia versus aspiration. -No leukocytosis or fever but x-ray findings concerning enough, antibiotics continued during hospital stay. -On discharge, thought antibiotics necessary because of lack of fever and leukocytosis.  Dementia -Patient is awake, alert and oriented 2 not to the date. -This is listed as diagnosis, if she has dementia this is probably very mild.  Congestive dilated cardiomyopathy -Continue home medications including Coreg, seen by cardiology as outpatient. -Spoke with daughter over the phone, he is to follow-up with cardiology for further evaluation. -Might need evaluation with cardiac cath and evaluation for ICD versus going hospice route.  Medical noncompliance -Stress importance of adherence to medications and instructions, she reported she wasn't taking Lasix because she ran out of it.   Discharge Instructions  Discharge Instructions    Diet - low sodium heart healthy    Complete by:  As directed    Increase activity slowly    Complete by:  As directed        Medication List    TAKE these medications   aspirin 81 MG chewable tablet Chew 1 tablet (81 mg total) by mouth daily.   carvedilol 3.125 MG tablet Commonly known as:  COREG Take 1 tablet (3.125 mg total) by mouth 2 (two) times daily with a meal.   feeding supplement (ENSURE ENLIVE) Liqd Take 237 mLs by mouth 3 (three) times daily between meals.   furosemide 20 MG tablet Commonly known as:  LASIX Take 1 tablet (20 mg total) by mouth daily.   nitroGLYCERIN 0.4 MG SL tablet Commonly known as:  NITROSTAT Place 1 tablet (0.4 mg total) under the tongue every 5 (five) minutes as needed for chest pain (or back pain  or arm pain).   potassium chloride SA 20 MEQ tablet Commonly known as:  K-DUR,KLOR-CON Take 1 tablet (20 mEq total) by mouth daily.   simvastatin 80 MG tablet Commonly known as:  ZOCOR Take 1 tablet (80 mg total) by mouth at bedtime.        No Known Allergies  Consultations:   None   Procedures (Echo, Carotid, EGD, Colonoscopy, ERCP)   Radiological studies: Dg Chest 2 View  Result Date: 08/28/2016 CLINICAL DATA:  Cough, generalized weakness EXAM: CHEST  2 VIEW COMPARISON:  04/14/2016 FINDINGS: Cardiomegaly again noted. Small bilateral pleural effusion with bilateral basilar atelectasis or infiltrate. No pulmonary edema. IMPRESSION: Cardiomegaly. Small bilateral pleural effusion with bilateral basilar atelectasis or infiltrate. No pulmonary edema. Electronically Signed   By: Natasha MeadLiviu  Pop M.D.   On: 08/28/2016 16:40   Ct Chest Wo Contrast  Result Date: 08/28/2016 CLINICAL DATA:  Cough and shortness of breath. Generalized weakness. EXAM: CT CHEST WITHOUT CONTRAST TECHNIQUE: Multidetector CT imaging of the chest was performed following the standard protocol without IV contrast. COMPARISON:  Chest radiograph from earlier today. 04/14/2016 chest CT angiogram. FINDINGS: Motion degraded scan. Cardiovascular: Mild cardiomegaly, stable. Trace pericardial effusion/ thickening, stable. Right coronary atherosclerosis. Atherosclerotic nonaneurysmal thoracic aorta. Normal caliber pulmonary arteries. Mediastinum/Nodes: Probable left hemithyroidectomy. Mild right-sided multinodular goiter with dominant 1.5 cm hypodense anterior right thyroid lobe nodule, stable. Unremarkable esophagus. No axillary adenopathy. No pathologically enlarged mediastinal or gross hilar nodes on this noncontrast study. Lungs/Pleura: No pneumothorax. Small to moderate layering bilateral pleural effusions. Moderate centrilobular Davidson and mild diffuse bronchial wall thickening. Mild-to-moderate compressive atelectasis in the dependent lower lobes bilaterally. New mild patchy consolidation in the peripheral right lower lobe. Mild interlobular septal thickening throughout both lungs. No lung masses or significant pulmonary nodules in the aerated portions of the lungs.  Upper abdomen: Cholecystectomy. Simple 2.1 cm renal cyst in the medial upper left kidney. Musculoskeletal: No aggressive appearing focal osseous lesions. Mild-to-moderate thoracic spondylosis. IMPRESSION: 1. Stable mild cardiomegaly. Mild interlobular septal thickening throughout both lungs suggesting mild pulmonary edema due to mild congestive heart failure. 2. Small to moderate layering bilateral pleural effusions with associated compressive bilateral lower lobe atelectasis. 3. New mild patchy consolidation in the peripheral right lower lobe, cannot exclude superimposed mild infectious or aspiration pneumonia. 4. Aortic atherosclerosis.  One vessel coronary atherosclerosis. Electronically Signed   By: Delbert PhenixJason A Poff M.D.   On: 08/28/2016 19:23     Subjective:  Discharge Exam: Vitals:   08/30/16 1512 08/30/16 2054 08/31/16 0510 08/31/16 0737  BP: (!) 102/56 90/65 101/67 106/67  Pulse: 80 80 76 68  Resp: 18 18 18 18   Temp:  97.5 F (36.4 C) 98.1 F (36.7 C) 97.6 F (36.4 C)  TempSrc:  Oral Oral Oral  SpO2: 94% 95% 94%   Weight:   38.3 kg (84 lb 8 oz)    General: Pt is alert, awake, not in acute distress Cardiovascular: RRR, S1/S2 +, no rubs, no gallops Respiratory: CTA bilaterally, no wheezing, no rhonchi Abdominal: Soft, NT, ND, bowel sounds + Extremities: no edema, no cyanosis   The results of significant diagnostics from this hospitalization (including imaging, microbiology, ancillary and laboratory) are listed below for reference.    Microbiology: No results found for this or any previous visit (from the past 240 hour(s)).   Labs: BNP (last 3 results)  Recent Labs  04/14/16 1757 08/28/16 1556  BNP 1,047.0* 2,410.0*   Basic Metabolic Panel:  Recent Labs Lab 08/28/16 1556 08/29/16 0559  08/30/16 0511 08/31/16 0636  NA 139 141 140 139  K 3.5 3.6 3.8 4.3  CL 107 105 103 102  CO2 26 27 28 28   GLUCOSE 113* 97 80 90  BUN 14 15 20  27*  CREATININE 0.99 0.99 1.21* 1.19*   CALCIUM 8.9 8.6* 9.2 9.0   Liver Function Tests:  Recent Labs Lab 08/28/16 1556  AST 19  ALT 11*  ALKPHOS 81  BILITOT 0.8  PROT 5.8*  ALBUMIN 3.3*   No results for input(s): LIPASE, AMYLASE in the last 168 hours. No results for input(s): AMMONIA in the last 168 hours. CBC:  Recent Labs Lab 08/28/16 1556  WBC 8.7  NEUTROABS 6.6  HGB 16.0*  HCT 46.5*  MCV 98.7  PLT 170   Cardiac Enzymes: No results for input(s): CKTOTAL, CKMB, CKMBINDEX, TROPONINI in the last 168 hours. BNP: Invalid input(s): POCBNP CBG: No results for input(s): GLUCAP in the last 168 hours. D-Dimer No results for input(s): DDIMER in the last 72 hours. Hgb A1c No results for input(s): HGBA1C in the last 72 hours. Lipid Profile No results for input(s): CHOL, HDL, LDLCALC, TRIG, CHOLHDL, LDLDIRECT in the last 72 hours. Thyroid function studies No results for input(s): TSH, T4TOTAL, T3FREE, THYROIDAB in the last 72 hours.  Invalid input(s): FREET3 Anemia work up No results for input(s): VITAMINB12, FOLATE, FERRITIN, TIBC, IRON, RETICCTPCT in the last 72 hours. Urinalysis    Component Value Date/Time   COLORURINE YELLOW 08/28/2016 1551   APPEARANCEUR CLEAR 08/28/2016 1551   LABSPEC >1.030 (H) 08/28/2016 1551   PHURINE 5.5 08/28/2016 1551   GLUCOSEU NEGATIVE 08/28/2016 1551   HGBUR LARGE (A) 08/28/2016 1551   BILIRUBINUR SMALL (A) 08/28/2016 1551   KETONESUR TRACE (A) 08/28/2016 1551   PROTEINUR 30 (A) 08/28/2016 1551   NITRITE NEGATIVE 08/28/2016 1551   LEUKOCYTESUR NEGATIVE 08/28/2016 1551   Sepsis Labs Invalid input(s): PROCALCITONIN,  WBC,  LACTICIDVEN Microbiology No results found for this or any previous visit (from the past 240 hour(s)).   Time coordinating discharge: Over 30 minutes  SIGNED:   Clint Lipps, MD  [facility] 08/31/2016, 10:25 AM Pager   If 7PM-7AM, please contact night-coverage [email or web address] Password [email or web address]

## 2016-09-01 ENCOUNTER — Other Ambulatory Visit: Payer: Self-pay

## 2016-09-01 MED ORDER — CARVEDILOL 3.125 MG PO TABS: 3.1250 mg | ORAL_TABLET | Freq: Two times a day (BID) | ORAL | 3 refills | Status: DC

## 2016-09-01 NOTE — Telephone Encounter (Signed)
Refilled coreg 3.125mg BID

## 2016-09-03 ENCOUNTER — Other Ambulatory Visit: Payer: Self-pay

## 2016-09-03 NOTE — Telephone Encounter (Signed)
Received rx refill request from [facility] address and [facility] phone number for this pt,unable to verify

## 2016-09-04 ENCOUNTER — Other Ambulatory Visit: Payer: Self-pay

## 2016-09-04 MED ORDER — CARVEDILOL 3.125 MG PO TABS: 3.1250 mg | ORAL_TABLET | Freq: Two times a day (BID) | ORAL | 6 refills | Status: DC

## 2016-09-04 NOTE — Telephone Encounter (Signed)
Refill complete 

## 2016-09-22 ENCOUNTER — Encounter: Payer: Self-pay | Admitting: Cardiovascular Disease

## 2016-09-22 ENCOUNTER — Ambulatory Visit (INDEPENDENT_AMBULATORY_CARE_PROVIDER_SITE_OTHER): Payer: Medicare Other | Admitting: Cardiovascular Disease

## 2016-09-22 VITALS — BP 106/68 | HR 80 | Ht 66.0 in | Wt 88.6 lb

## 2016-09-22 DIAGNOSIS — I513 Intracardiac thrombosis, not elsewhere classified: Secondary | ICD-10-CM

## 2016-09-22 DIAGNOSIS — E7800 Pure hypercholesterolemia, unspecified: Secondary | ICD-10-CM | POA: Diagnosis not present

## 2016-09-22 DIAGNOSIS — I5022 Chronic systolic (congestive) heart failure: Secondary | ICD-10-CM | POA: Diagnosis not present

## 2016-09-22 DIAGNOSIS — I420 Dilated cardiomyopathy: Secondary | ICD-10-CM

## 2016-09-22 DIAGNOSIS — I2510 Atherosclerotic heart disease of native coronary artery without angina pectoris: Secondary | ICD-10-CM

## 2016-09-22 DIAGNOSIS — I519 Heart disease, unspecified: Secondary | ICD-10-CM | POA: Diagnosis not present

## 2016-09-22 MED ORDER — SIMVASTATIN 80 MG PO TABS: 80.0000 mg | ORAL_TABLET | Freq: Every day | ORAL | 11 refills | Status: DC

## 2016-09-22 MED ORDER — FUROSEMIDE 40 MG PO TABS: 40.0000 mg | ORAL_TABLET | Freq: Every day | ORAL | 3 refills | Status: DC

## 2016-09-22 NOTE — Progress Notes (Signed)
SUBJECTIVE: The patient presents for posthospitalization follow-up. She was hospitalized in October 2017 for acute on chronic systolic heart failure, LVEF 15%.  She had run out of her Lasix at home. She also had community-acquired pneumonia.  Echocardiogram on 08/29/16 also demonstrated grade 2 diastolic dysfunction. An evolving apical thrombus could not be excluded. There was mild to moderate left atrial dilatation.   She has a history of coronary artery disease with inferior wall MI in 2003 for which she received a bare metal stent, and then had an anterior wall MI due to distal LAD disease which was treated medically due to the diffuse nature of her disease.   It was previously suggested she had end-stage cardiomyopathy and cardiac cachexia and a palliative care consult was suggested by Dr. Diona BrownerMcDowell, but the patient was not receptive to this.  Discharge wt: 84 lbs (08/31/16).     Review of Systems: As per "subjective", otherwise negative.  No Known Allergies  Current Outpatient Prescriptions  Medication Sig Dispense Refill  . aspirin 81 MG chewable tablet Chew 1 tablet (81 mg total) by mouth daily. 30 tablet 1  . carvedilol (COREG) 3.125 MG tablet Take 1 tablet (3.125 mg total) by mouth 2 (two) times daily with a meal. 60 tablet 6  . feeding supplement, ENSURE ENLIVE, (ENSURE ENLIVE) LIQD Take 237 mLs by mouth 3 (three) times daily between meals. 237 mL 12  . furosemide (LASIX) 20 MG tablet Take 1 tablet (20 mg total) by mouth daily. 90 tablet 3  . nitroGLYCERIN (NITROSTAT) 0.4 MG SL tablet Place 1 tablet (0.4 mg total) under the tongue every 5 (five) minutes as needed for chest pain (or back pain or arm pain). 30 tablet 0  . potassium chloride SA (K-DUR,KLOR-CON) 20 MEQ tablet Take 1 tablet (20 mEq total) by mouth daily. 30 tablet 6  . simvastatin (ZOCOR) 80 MG tablet Take 1 tablet (80 mg total) by mouth at bedtime. 30 tablet 11   No current facility-administered medications  for this visit.     Past Medical History:  Diagnosis Date  . MI (myocardial infarction)     No past surgical history on file.  Social History   Social History  . Marital status: Married    Spouse name: N/A  . Number of children: N/A  . Years of education: N/A   Occupational History  . Not on file.   Social History Main Topics  . Smoking status: Current Every Day Smoker    Packs/day: 0.25    Years: 50.00    Types: Cigarettes    Start date: 11/02/1958  . Smokeless tobacco: Never Used  . Alcohol use No  . Drug use: No  . Sexual activity: Not on file   Other Topics Concern  . Not on file   Social History Narrative  . No narrative on file     Vitals:   09/22/16 1355  BP: 106/68  Pulse: 80  SpO2: 95%  Weight: 88 lb 9.6 oz (40.2 kg)  Height: 5\' 6"  (1.676 m)    PHYSICAL EXAM General: NAD HEENT: Normal. Neck: No JVD, no thyromegaly. Lungs: Clear to auscultation bilaterally with normal respiratory effort. CV: Nondisplaced PMI.  Regular rate and rhythm, normal S1/S2, no S3/S4, no murmur. No pretibial or periankle edema.  No carotid bruit.   Abdomen: Soft, nontender, no distention.  Neurologic: Alert and oriented.  Psych: Normal affect. Skin: Normal. Musculoskeletal: No gross deformities.    ECG: Most recent ECG reviewed.  ASSESSMENT AND PLAN: 1. Chronic systolic heart failure: Wt stable. However, given severely reduced EF, will increase Lasix to 40 mg daily.  Will make appt with advanced HF clinic. A palliative care consult appears warranted. Continue Coreg and Lasix. Given low normal blood pressure, unlikely able to add ACE inhibitors or ARB's nor spironolactone.  2. CAD: Continue aspirin, Coreg, and simvastatin.  3. HTN: Low normal.  4. Hyperlipidemia: On simvastatin 80 mg.  5. Apical thrombus: Will repeat limited echo with contrast to determine if anticoagulation is warranted.  Dispo: fu with advanced HF clinic.   Prentice DockerSuresh Koneswaran, M.D.,  F.A.C.C.

## 2016-09-22 NOTE — Patient Instructions (Signed)
Your physician recommends that you schedule a follow-up appointment in: Heart failure clinic will determine    You have been referred to Heart Failure Clinic, they will call you for apt    Your physician has requested that you have a LIMITED echocardiogram. Echocardiography is a painless test that uses sound waves to create images of your heart. It provides your doctor with information about the size and shape of your heart and how well your heart's chambers and valves are working. This procedure takes approximately one hour. There are no restrictions for this procedure.    INCREASE Lasix to 40 mg daily     Thank you for choosing [facility] !

## 2016-09-22 NOTE — Addendum Note (Signed)
Addended by: Marlyn CorporalARLTON, CATHERINE A on: 09/22/2016 02:23 PM   Modules accepted: Orders

## 2016-10-01 ENCOUNTER — Telehealth (HOSPITAL_COMMUNITY): Payer: Self-pay | Admitting: Vascular Surgery

## 2016-10-01 NOTE — Telephone Encounter (Signed)
Left pt message to make NP appt 6-8 weeks

## 2016-10-09 ENCOUNTER — Ambulatory Visit (HOSPITAL_COMMUNITY)
Admission: RE | Admit: 2016-10-09 | Discharge: 2016-10-09 | Disposition: A | Discharge status: Home / Self Care | Payer: Medicare Other | Source: Ambulatory Visit | Attending: Cardiovascular Disease | Admitting: Cardiovascular Disease

## 2016-10-09 DIAGNOSIS — E785 Hyperlipidemia, unspecified: Secondary | ICD-10-CM | POA: Insufficient documentation

## 2016-10-09 DIAGNOSIS — I420 Dilated cardiomyopathy: Secondary | ICD-10-CM | POA: Diagnosis not present

## 2016-10-09 DIAGNOSIS — I509 Heart failure, unspecified: Secondary | ICD-10-CM | POA: Insufficient documentation

## 2016-10-09 DIAGNOSIS — I2510 Atherosclerotic heart disease of native coronary artery without angina pectoris: Secondary | ICD-10-CM | POA: Diagnosis not present

## 2016-10-09 DIAGNOSIS — I513 Intracardiac thrombosis, not elsewhere classified: Secondary | ICD-10-CM | POA: Diagnosis not present

## 2016-10-09 DIAGNOSIS — Z720 Tobacco use: Secondary | ICD-10-CM | POA: Insufficient documentation

## 2016-10-09 MED ORDER — PERFLUTREN LIPID MICROSPHERE
1.0000 mL | INTRAVENOUS | Status: AC | PRN
  Administered 2016-10-09: 1 mL via INTRAVENOUS

## 2016-10-09 NOTE — Progress Notes (Signed)
*  PRELIMINARY RESULTS* Echocardiogram 2D Echocardiogram limited with definity has been performed.  Jeryl Columbialliott, Makinze Jani 10/09/2016, 12:10 PM

## 2016-10-15 ENCOUNTER — Telehealth (HOSPITAL_COMMUNITY): Payer: Self-pay | Admitting: Vascular Surgery

## 2016-10-15 NOTE — Telephone Encounter (Signed)
Pt was referred to Heart Failure clinic @ [facility] on 10/01/16 by Purvis SheffieldKoneswaran , finally talked to pt daughter today she states her echo was fine and she does not need to make appt with clinic.. Just letting you know why pt has not been scheduled

## 2017-04-03 ENCOUNTER — Other Ambulatory Visit: Payer: Self-pay | Admitting: Cardiovascular Disease

## 2017-05-10 ENCOUNTER — Encounter (HOSPITAL_COMMUNITY): Payer: Self-pay | Admitting: Emergency Medicine

## 2017-05-10 ENCOUNTER — Emergency Department (HOSPITAL_COMMUNITY): Payer: Medicare Other

## 2017-05-10 ENCOUNTER — Emergency Department (HOSPITAL_COMMUNITY)
Admission: EM | Admit: 2017-05-10 | Discharge: 2017-05-10 | Disposition: A | Discharge status: Home / Self Care | Payer: Medicare Other | Attending: Emergency Medicine | Admitting: Emergency Medicine

## 2017-05-10 DIAGNOSIS — Z7982 Long term (current) use of aspirin: Secondary | ICD-10-CM | POA: Insufficient documentation

## 2017-05-10 DIAGNOSIS — Z79899 Other long term (current) drug therapy: Secondary | ICD-10-CM | POA: Insufficient documentation

## 2017-05-10 DIAGNOSIS — I2510 Atherosclerotic heart disease of native coronary artery without angina pectoris: Secondary | ICD-10-CM | POA: Insufficient documentation

## 2017-05-10 DIAGNOSIS — F0390 Unspecified dementia without behavioral disturbance: Secondary | ICD-10-CM | POA: Insufficient documentation

## 2017-05-10 DIAGNOSIS — E860 Dehydration: Secondary | ICD-10-CM | POA: Diagnosis not present

## 2017-05-10 DIAGNOSIS — F17210 Nicotine dependence, cigarettes, uncomplicated: Secondary | ICD-10-CM | POA: Diagnosis not present

## 2017-05-10 DIAGNOSIS — N390 Urinary tract infection, site not specified: Secondary | ICD-10-CM | POA: Diagnosis not present

## 2017-05-10 DIAGNOSIS — J449 Chronic obstructive pulmonary disease, unspecified: Secondary | ICD-10-CM | POA: Insufficient documentation

## 2017-05-10 DIAGNOSIS — N3001 Acute cystitis with hematuria: Secondary | ICD-10-CM | POA: Diagnosis not present

## 2017-05-10 DIAGNOSIS — J181 Lobar pneumonia, unspecified organism: Secondary | ICD-10-CM | POA: Diagnosis not present

## 2017-05-10 DIAGNOSIS — R319 Hematuria, unspecified: Secondary | ICD-10-CM

## 2017-05-10 DIAGNOSIS — Z7901 Long term (current) use of anticoagulants: Secondary | ICD-10-CM | POA: Diagnosis not present

## 2017-05-10 DIAGNOSIS — I509 Heart failure, unspecified: Secondary | ICD-10-CM | POA: Diagnosis not present

## 2017-05-10 DIAGNOSIS — I252 Old myocardial infarction: Secondary | ICD-10-CM | POA: Insufficient documentation

## 2017-05-10 DIAGNOSIS — R05 Cough: Secondary | ICD-10-CM | POA: Diagnosis not present

## 2017-05-10 HISTORY — DX: Disorder of thyroid, unspecified: E07.9

## 2017-05-10 HISTORY — DX: Chronic obstructive pulmonary disease, unspecified: J44.9

## 2017-05-10 HISTORY — DX: Heart failure, unspecified: I50.9

## 2017-05-10 HISTORY — DX: Unspecified dementia, unspecified severity, without behavioral disturbance, psychotic disturbance, mood disturbance, and anxiety: F03.90

## 2017-05-10 HISTORY — DX: Atherosclerotic heart disease of native coronary artery without angina pectoris: I25.10

## 2017-05-10 HISTORY — DX: Patient's noncompliance with other medical treatment and regimen: Z91.19

## 2017-05-10 HISTORY — DX: Patient's noncompliance with other medical treatment and regimen due to unspecified reason: Z91.199

## 2017-05-10 HISTORY — DX: Heart disease, unspecified: I51.9

## 2017-05-10 HISTORY — DX: Tobacco use: Z72.0

## 2017-05-10 LAB — CBC WITH DIFFERENTIAL/PLATELET
BASOS ABS: 0 10*3/uL (ref 0.0–0.1)
BASOS PCT: 0 %
EOS PCT: 1 %
Eosinophils Absolute: 0.1 10*3/uL (ref 0.0–0.7)
HCT: 51.8 % — ABNORMAL HIGH (ref 36.0–46.0)
Hemoglobin: 17.4 g/dL — ABNORMAL HIGH (ref 12.0–15.0)
Lymphocytes Relative: 23 %
Lymphs Abs: 1.7 10*3/uL (ref 0.7–4.0)
MCH: 32.6 pg (ref 26.0–34.0)
MCHC: 33.6 g/dL (ref 30.0–36.0)
MCV: 97 fL (ref 78.0–100.0)
MONO ABS: 0.9 10*3/uL (ref 0.1–1.0)
MONOS PCT: 12 %
Neutro Abs: 4.7 10*3/uL (ref 1.7–7.7)
Neutrophils Relative %: 64 %
PLATELETS: 193 10*3/uL (ref 150–400)
RBC: 5.34 MIL/uL — ABNORMAL HIGH (ref 3.87–5.11)
RDW: 13.2 % (ref 11.5–15.5)
WBC: 7.5 10*3/uL (ref 4.0–10.5)

## 2017-05-10 LAB — BASIC METABOLIC PANEL
Anion gap: 9 (ref 5–15)
BUN: 17 mg/dL (ref 6–20)
CALCIUM: 9.3 mg/dL (ref 8.9–10.3)
CO2: 30 mmol/L (ref 22–32)
Chloride: 99 mmol/L — ABNORMAL LOW (ref 101–111)
Creatinine, Ser: 1.31 mg/dL — ABNORMAL HIGH (ref 0.44–1.00)
GFR, EST AFRICAN AMERICAN: 45 mL/min — AB (ref >60)
GFR, EST NON AFRICAN AMERICAN: 39 mL/min — AB (ref >60)
Glucose, Bld: 109 mg/dL — ABNORMAL HIGH (ref 65–99)
Potassium: 3.8 mmol/L (ref 3.5–5.1)
SODIUM: 138 mmol/L (ref 135–145)

## 2017-05-10 LAB — URINALYSIS, ROUTINE W REFLEX MICROSCOPIC
BILIRUBIN URINE: NEGATIVE
Glucose, UA: NEGATIVE mg/dL
Ketones, ur: 5 mg/dL — AB
NITRITE: NEGATIVE
PH: 6 (ref 5.0–8.0)
Protein, ur: NEGATIVE mg/dL
SPECIFIC GRAVITY, URINE: 1.016 (ref 1.005–1.030)

## 2017-05-10 LAB — TROPONIN I

## 2017-05-10 LAB — BRAIN NATRIURETIC PEPTIDE: B NATRIURETIC PEPTIDE 5: 359 pg/mL — AB (ref 0.0–100.0)

## 2017-05-10 LAB — LACTIC ACID, PLASMA: Lactic Acid, Venous: 1 mmol/L (ref 0.5–1.9)

## 2017-05-10 MED ORDER — SODIUM CHLORIDE 0.9 % IV BOLUS (SEPSIS)
250.0000 mL | Freq: Once | INTRAVENOUS | Status: AC
  Administered 2017-05-10: 250 mL via INTRAVENOUS

## 2017-05-10 MED ORDER — SODIUM CHLORIDE 0.9 % IV SOLN
INTRAVENOUS | Status: DC
  Administered 2017-05-10: 15:00:00 via INTRAVENOUS

## 2017-05-10 MED ORDER — CEPHALEXIN 250 MG PO CAPS: 250.0000 mg | ORAL_CAPSULE | Freq: Four times a day (QID) | ORAL | 0 refills | Status: DC

## 2017-05-10 NOTE — ED Provider Notes (Signed)
[facility]-EMERGENCY DEPT Provider Note   CSN: [id number] Arrival date & time: 05/10/17  1400     History   Chief Complaint Chief Complaint  Patient presents with  . Abnormal Lab    HPI Ruth Davidson is a 76 y.o. female.  The history is provided by the patient and a relative. The history is limited by the condition of the patient (Hx dementia).  Abnormal Lab  Pt was seen at 1405. Per pt and her daughter: Pt c/o cough x2 weeks. Pt was evaluated at a local [facility], then sent to the ED for "pneumonia" and "dehydration." [name] states pt's BP was "low when they stood her up from sitting." Denies fevers, no vomiting/diarrhea, no CP/SOB, no abd pain.   Past Medical History:  Diagnosis Date  . Cardiac cachexia   . CHF (congestive heart failure) (HCC)   . COPD (chronic obstructive pulmonary disease) (HCC)   . Coronary artery disease   . Dementia   . MI (myocardial infarction) (HCC)   . Non-compliance   . Thyroid disease   . Tobacco use     Patient Active Problem List   Diagnosis Date Noted  . Acute on chronic systolic CHF (congestive heart failure) (HCC) 08/28/2016  . Noncompliance 08/28/2016  . 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 Emphysema of lung (HCC) 06/11/2016  . 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 Dementia 06/11/2016  . NSVT (nonsustained ventricular tachycardia) (HCC) 04/15/2016  . Acute systolic CHF (congestive heart failure) (HCC) 04/15/2016  . Community acquired pneumonia   . CAD S/P remote PCI 08/30/2013  . Hyperlipidemia 08/30/2013  . Tobacco abuse 08/30/2013    Past Surgical History:  Procedure Laterality Date  . CHOLECYSTECTOMY    . CORONARY ANGIOPLASTY WITH STENT PLACEMENT  2003  . THYROIDECTOMY      OB History    No data available       Home Medications    Prior to Admission medications   Medication Sig Start Date End Date Taking? Authorizing Provider  aspirin 81 MG chewable tablet Chew 1 tablet (81 mg total) by mouth daily.  04/18/16  Yes Elgergawy, Leana Roe, MD  carvedilol (COREG) 3.125 MG tablet Take 1 tablet (3.125 mg total) by mouth 2 (two) times daily with a meal. 09/04/16  Yes Laqueta Linden, MD  furosemide (LASIX) 40 MG tablet Take 1 tablet (40 mg total) by mouth daily. 09/22/16 05/10/17 Yes Laqueta Linden, MD  KLOR-CON M20 20 MEQ tablet TAKE ONE TABLET BY MOUTH ONCE DAILY 04/05/17  Yes Laqueta Linden, MD  nitroGLYCERIN (NITROSTAT) 0.4 MG SL tablet Place 1 tablet (0.4 mg total) under the tongue every 5 (five) minutes as needed for chest pain (or back pain or arm pain). 11/10/15  Yes Dione Booze, MD  simvastatin (ZOCOR) 80 MG tablet Take 1 tablet (80 mg total) by mouth at bedtime. 09/22/16  Yes Laqueta Linden, MD    Family History Family History  Problem Relation Age of Onset  . Hypertension Father        Died in war    Social History Social History  Substance Use Topics  . Smoking status: Current Every Day Smoker    Packs/day: 0.25    Years: 50.00    Types: Cigarettes    Start date: 11/02/1958  . Smokeless tobacco: Never Used  . Alcohol use No     Allergies   Patient has no known allergies.   Review of Systems Review of Systems  Unable to  perform ROS: Dementia     Physical Exam Updated Vital Signs BP (!) 89/57 (BP Location: Left Arm)   Pulse 79   Temp 97.6 F (36.4 C) (Oral)   Resp (!) 22   Ht 5\' 6"  (1.676 m)   Wt 39.9 kg (88 lb)   SpO2 99%   BMI 14.20 kg/m   14:20 Orthostatic Vital Signs CS  Orthostatic Lying   BP- Lying: 117/72  Pulse- Lying: 66      Orthostatic Sitting  BP- Sitting: 98/72  Pulse- Sitting: 92      Orthostatic Standing at 0 minutes  BP- Standing at 0 minutes:  83/56  Pulse- Standing at 0 minutes: 80      Physical Exam 1410: Physical examination:  Nursing notes reviewed; Vital signs and O2 SAT reviewed;  Constitutional: Thin, frail. In no acute distress; Head:  Normocephalic, atraumatic; Eyes: EOMI, PERRL, No scleral icterus;  ENMT: Mouth and pharynx normal, Mucous membranes dry; Neck: Supple, Full range of motion, No lymphadenopathy; Cardiovascular: Regular rate and rhythm, [place]; Respiratory: Breath sounds coarse & equal bilaterally, No wheezes. Normal respiratory effort/excursion; Chest: Nontender, Movement normal; Abdomen: Soft, Nontender, Nondistended, Normal bowel sounds; Genitourinary: No CVA tenderness; Extremities: Pulses normal, No tenderness, No edema, No calf edema or asymmetry.; Neuro: Awake, alert, confused per baseline. No facial droop. Speech clear. Moves all extremities spontaneously and to command without apparent  gross focal motor deficits.; Skin: Color normal, Warm, Dry.   ED Treatments / Results  Labs (all labs ordered are listed, but only abnormal results are displayed)   EKG  EKG Interpretation  Date/Time:  Monday May 10 2017 14:12:22 EDT Ventricular Rate:  81 PR Interval:    QRS Duration: 106 QT Interval:  401 QTC Calculation: 466 R Axis:   -31 Text Interpretation:  Sinus rhythm Biatrial enlargement LVH with secondary repolarization abnormality Inferior infarct, old Anterior infarct, old When compared with ECG of 08/28/2016 No significant change was found Confirmed by [facility]  MD, Nicholos Johns [id number]) on 05/10/2017 2:47:20 PM       Radiology   Procedures Procedures (including critical care time)  Medications Ordered in ED Medications  0.9 %  sodium chloride infusion (not administered)  sodium chloride 0.9 % bolus 250 mL (250 mLs Intravenous New Bag/Given 05/10/17 1440)     Initial Impression / Assessment and Plan / ED Course  I have reviewed the triage vital signs and the nursing notes.  Pertinent labs & imaging results that were available during my care of the patient were reviewed by me and considered in my medical decision making (see chart for details).  MDM Reviewed: previous chart, nursing note and vitals Reviewed previous: labs and ECG Interpretation: labs, ECG and  x-ray   Results for orders placed or performed during the hospital encounter of 05/10/17  Basic metabolic panel  Result Value Ref Range   Sodium 138 135 - 145 mmol/L   Potassium 3.8 3.5 - 5.1 mmol/L   Chloride 99 (L) 101 - 111 mmol/L   CO2 30 22 - 32 mmol/L   Glucose, Bld 109 (H) 65 - 99 mg/dL   BUN 17 6 - 20 mg/dL   Creatinine, Ser 6.04 (H) 0.44 - 1.00 mg/dL   Calcium 9.3 8.9 - 54.0 mg/dL   GFR calc non Af Amer 39 (L) >60 mL/min   GFR calc Af Amer 45 (L) >60 mL/min   Anion gap 9 5 - 15  Brain natriuretic peptide  Result Value Ref Range   B Natriuretic  Peptide 359.0 (H) 0.0 - 100.0 pg/mL  Troponin I  Result Value Ref Range   Troponin I <0.03 <0.03 ng/mL  Lactic acid, plasma  Result Value Ref Range   Lactic Acid, Venous 1.0 0.5 - 1.9 mmol/L  CBC with Differential  Result Value Ref Range   WBC 7.5 4.0 - 10.5 K/uL   RBC 5.34 (H) 3.87 - 5.11 MIL/uL   Hemoglobin 17.4 (H) 12.0 - 15.0 g/dL   HCT 16.151.8 (H) 09.636.0 - 04.546.0 %   MCV 97.0 78.0 - 100.0 fL   MCH 32.6 26.0 - 34.0 pg   MCHC 33.6 30.0 - 36.0 g/dL   RDW 40.913.2 81.111.5 - 91.415.5 %   Platelets 193 150 - 400 K/uL   Neutrophils Relative % 64 %   Neutro Abs 4.7 1.7 - 7.7 K/uL   Lymphocytes Relative 23 %   Lymphs Abs 1.7 0.7 - 4.0 K/uL   Monocytes Relative 12 %   Monocytes Absolute 0.9 0.1 - 1.0 K/uL   Eosinophils Relative 1 %   Eosinophils Absolute 0.1 0.0 - 0.7 K/uL   Basophils Relative 0 %   Basophils Absolute 0.0 0.0 - 0.1 K/uL   Dg Chest 2 View Result Date: 05/10/2017 CLINICAL DATA:  Cough. EXAM: CHEST  2 VIEW 2:35 p.m. COMPARISON:  05/10/2017 at 1:30 p.m. FINDINGS: The heart size and pulmonary vascularity are normal. Lungs are clear. No acute bone abnormality. No effusions. IMPRESSION: No acute abnormalities. Electronically Signed   By: Francene BoyersJames  Maxwell M.D.   On: 05/10/2017 14:59    1615:  CXR reassuring. Judicious IVF given for orthostatic VS.  Pt wants to go home and family would like to take her home.  Udip pending. Will need  to PO challenge and ambulate. Sign out to Dr. Adriana Simasook.   Final Clinical Impressions(s) / ED Diagnoses   Final diagnoses:  None    New Prescriptions New Prescriptions   No medications on file      Samuel JesterMcManus, Iracema Lanagan, DO 05/10/17 1624

## 2017-05-10 NOTE — Discharge Instructions (Signed)
You have a urinary tract infection. Increase fluids. Prescription for antibiotic. °

## 2017-05-10 NOTE — ED Notes (Signed)
Patient transported to X-ray 

## 2017-05-10 NOTE — ED Notes (Signed)
Attempted to obtain urine unable to urinate

## 2017-05-10 NOTE — ED Notes (Signed)
Pt returned from X-ray.  

## 2017-05-10 NOTE — ED Triage Notes (Signed)
Pt sent from Urgent care for evaluation of possible pneumonia and dehydration.  Daughter reports pt was orthostatic at the office.

## 2017-05-10 NOTE — ED Provider Notes (Signed)
Urinalysis shows evidence of an infection. Will Rx Keflex 250 mg 4 times a day for 1 week. Findings were discussed with the patient and her daughter.   Donnetta Hutchingook, Ottilie Wigglesworth, MD 05/10/17 1725

## 2017-05-10 NOTE — ED Notes (Signed)
Pt ambulated to BR w minimal assistance. Able to tolerate po fluids

## 2017-05-12 LAB — URINE CULTURE

## 2017-05-13 ENCOUNTER — Telehealth: Payer: Self-pay | Admitting: Emergency Medicine

## 2017-05-13 NOTE — Telephone Encounter (Signed)
Post ED Visit - Positive Culture Follow-up  Culture report reviewed by antimicrobial stewardship pharmacist:  []  Enzo BiNathan Batchelder, Pharm.D. []  Celedonio MiyamotoJeremy Frens, Pharm.D., BCPS AQ-ID []  Garvin FilaMike Maccia, Pharm.D., BCPS [x]  Georgina PillionElizabeth Martin, [address]Ph[address] BCPS []  Cross TimbersMinh Pham, [address]Pharm[address]D., BCPS, AAHIVP []  Estella HuskMichelle Turner, Pharm.D., BCPS, AAHIVP []  Lysle Pearlachel Rumbarger, PharmD, BCPS []  Casilda Carlsaylor Stone, PharmD, BCPS []  Pollyann SamplesAndy Johnston, PharmD, BCPS  Positive urine culture Treated with cephalexin, organism sensitive to the same and no further patient follow-up is required at this time.  Berle MullMiller, Bertrand Vowels 05/13/2017, 11:15 AM

## 2017-05-19 ENCOUNTER — Emergency Department (HOSPITAL_COMMUNITY)
Admission: EM | Admit: 2017-05-19 | Discharge: 2017-05-20 | Disposition: A | Discharge status: Home / Self Care | Payer: Medicare Other | Attending: Emergency Medicine | Admitting: Emergency Medicine

## 2017-05-19 ENCOUNTER — Emergency Department (HOSPITAL_COMMUNITY): Payer: Medicare Other

## 2017-05-19 ENCOUNTER — Encounter (HOSPITAL_COMMUNITY): Payer: Self-pay | Admitting: Emergency Medicine

## 2017-05-19 DIAGNOSIS — I5023 Acute on chronic systolic (congestive) heart failure: Secondary | ICD-10-CM | POA: Diagnosis not present

## 2017-05-19 DIAGNOSIS — R319 Hematuria, unspecified: Secondary | ICD-10-CM | POA: Diagnosis not present

## 2017-05-19 DIAGNOSIS — Z79899 Other long term (current) drug therapy: Secondary | ICD-10-CM | POA: Insufficient documentation

## 2017-05-19 DIAGNOSIS — F17210 Nicotine dependence, cigarettes, uncomplicated: Secondary | ICD-10-CM | POA: Insufficient documentation

## 2017-05-19 DIAGNOSIS — R0602 Shortness of breath: Secondary | ICD-10-CM | POA: Diagnosis not present

## 2017-05-19 DIAGNOSIS — Z955 Presence of coronary angioplasty implant and graft: Secondary | ICD-10-CM | POA: Diagnosis not present

## 2017-05-19 DIAGNOSIS — J449 Chronic obstructive pulmonary disease, unspecified: Secondary | ICD-10-CM | POA: Diagnosis not present

## 2017-05-19 DIAGNOSIS — R05 Cough: Secondary | ICD-10-CM | POA: Diagnosis present

## 2017-05-19 DIAGNOSIS — J189 Pneumonia, unspecified organism: Secondary | ICD-10-CM | POA: Diagnosis not present

## 2017-05-19 DIAGNOSIS — I2510 Atherosclerotic heart disease of native coronary artery without angina pectoris: Secondary | ICD-10-CM | POA: Insufficient documentation

## 2017-05-19 DIAGNOSIS — Z7982 Long term (current) use of aspirin: Secondary | ICD-10-CM | POA: Insufficient documentation

## 2017-05-19 LAB — COMPREHENSIVE METABOLIC PANEL
ALT: 11 U/L — ABNORMAL LOW (ref 14–54)
AST: 17 U/L (ref 15–41)
Albumin: 3.5 g/dL (ref 3.5–5.0)
Alkaline Phosphatase: 100 U/L (ref 38–126)
Anion gap: 11 (ref 5–15)
BUN: 15 mg/dL (ref 6–20)
CO2: 30 mmol/L (ref 22–32)
Calcium: 9.4 mg/dL (ref 8.9–10.3)
Chloride: 98 mmol/L — ABNORMAL LOW (ref 101–111)
Creatinine, Ser: 1.16 mg/dL — ABNORMAL HIGH (ref 0.44–1.00)
GFR calc Af Amer: 52 mL/min — ABNORMAL LOW (ref >60)
GFR calc non Af Amer: 45 mL/min — ABNORMAL LOW (ref >60)
Glucose, Bld: 101 mg/dL — ABNORMAL HIGH (ref 65–99)
Potassium: 3.7 mmol/L (ref 3.5–5.1)
Sodium: 139 mmol/L (ref 135–145)
Total Bilirubin: 0.9 mg/dL (ref 0.3–1.2)
Total Protein: 6.9 g/dL (ref 6.5–8.1)

## 2017-05-19 LAB — CBC WITH DIFFERENTIAL/PLATELET
Basophils Absolute: 0 10*3/uL (ref 0.0–0.1)
Basophils Relative: 0 %
Eosinophils Absolute: 0.1 10*3/uL (ref 0.0–0.7)
Eosinophils Relative: 1 %
HCT: 46.1 % — ABNORMAL HIGH (ref 36.0–46.0)
Hemoglobin: 15.6 g/dL — ABNORMAL HIGH (ref 12.0–15.0)
Lymphocytes Relative: 20 %
Lymphs Abs: 1.6 10*3/uL (ref 0.7–4.0)
MCH: 32.6 pg (ref 26.0–34.0)
MCHC: 33.8 g/dL (ref 30.0–36.0)
MCV: 96.4 fL (ref 78.0–100.0)
Monocytes Absolute: 0.9 10*3/uL (ref 0.1–1.0)
Monocytes Relative: 11 %
Neutro Abs: 5.4 10*3/uL (ref 1.7–7.7)
Neutrophils Relative %: 68 %
Platelets: 223 10*3/uL (ref 150–400)
RBC: 4.78 MIL/uL (ref 3.87–5.11)
RDW: 12.7 % (ref 11.5–15.5)
WBC: 8 10*3/uL (ref 4.0–10.5)

## 2017-05-19 MED ORDER — LEVOFLOXACIN IN D5W 500 MG/100ML IV SOLN
500.0000 mg | Freq: Once | INTRAVENOUS | Status: AC
  Administered 2017-05-19: 500 mg via INTRAVENOUS
  Filled 2017-05-19: qty 100

## 2017-05-19 MED ORDER — SODIUM CHLORIDE 0.9 % IV BOLUS (SEPSIS)
500.0000 mL | Freq: Once | INTRAVENOUS | Status: AC
  Administered 2017-05-19: 500 mL via INTRAVENOUS

## 2017-05-19 NOTE — ED Provider Notes (Signed)
[facility] DEPT Provider Note   CSN: [id number] Arrival date & time: 05/19/17  2117     History   Chief Complaint Chief Complaint  Patient presents with  . Cough    HPI Ruth Davidson is a 76 y.o. female.  HPI Patient with 1-2 weeks of cough. Has had increased fatigue and generalized weakness. Decreased appetite. No fever or chills. Denies chest pain or shortness of breath. Recently evaluated in the emergency department and treated for urinary tract infection. Past Medical History:  Diagnosis Date  . Cardiac cachexia   . CHF (congestive heart failure) (HCC)   . COPD (chronic obstructive pulmonary disease) (HCC)   . Coronary artery disease   . Dementia   . MI (myocardial infarction) (HCC)   . Non-compliance   . Thyroid disease   . Tobacco use     Patient Active Problem List   Diagnosis Date Noted  . Acute on chronic systolic CHF (congestive heart failure) (HCC) 08/28/2016  . Noncompliance 08/28/2016  . 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 Emphysema of lung (HCC) 06/11/2016  . 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 Dementia 06/11/2016  . NSVT (nonsustained ventricular tachycardia) (HCC) 04/15/2016  . Acute systolic CHF (congestive heart failure) (HCC) 04/15/2016  . Community acquired pneumonia   . CAD S/P remote PCI 08/30/2013  . Hyperlipidemia 08/30/2013  . Tobacco abuse 08/30/2013    Past Surgical History:  Procedure Laterality Date  . CHOLECYSTECTOMY    . CORONARY ANGIOPLASTY WITH STENT PLACEMENT  2003  . THYROIDECTOMY      OB History    No data available       Home Medications    Prior to Admission medications   Medication Sig Start Date End Date Taking? Authorizing Provider  aspirin 81 MG chewable tablet Chew 1 tablet (81 mg total) by mouth daily. 04/18/16  Yes Elgergawy, Leana Roe, MD  carvedilol (COREG) 3.125 MG tablet Take 1 tablet (3.125 mg total) by mouth 2 (two) times daily with a meal. 09/04/16  Yes Laqueta Linden, MD    cephALEXin (KEFLEX) 250 MG capsule Take 1 capsule (250 mg total) by mouth 4 (four) times daily. 05/10/17  Yes Donnetta Hutching, MD  furosemide (LASIX) 40 MG tablet Take 1 tablet (40 mg total) by mouth daily. 09/22/16 05/19/17 Yes Laqueta Linden, MD  KLOR-CON M20 20 MEQ tablet TAKE ONE TABLET BY MOUTH ONCE DAILY 04/05/17  Yes Laqueta Linden, MD  nitroGLYCERIN (NITROSTAT) 0.4 MG SL tablet Place 1 tablet (0.4 mg total) under the tongue every 5 (five) minutes as needed for chest pain (or back pain or arm pain). 11/10/15  Yes Dione Booze, MD  Phenyleph-CPM-DM-Aspirin (ALKA-SELTZER PLUS COLD & COUGH PO) Take by mouth daily as needed (for cough).   Yes [provider]  Pseudoephedrine-Acetaminophen (SUDAFED SINUS PO) Take 1 tablet by mouth daily as needed (for cough).   Yes [provider]  simvastatin (ZOCOR) 80 MG tablet Take 1 tablet (80 mg total) by mouth at bedtime. 09/22/16  Yes Laqueta Linden, MD    Family History Family History  Problem Relation Age of Onset  . Hypertension Father        Died in war    Social History Social History  Substance Use Topics  . Smoking status: Current Every Day Smoker    Packs/day: 0.25    Years: 50.00    Types: Cigarettes    Start date: 11/02/1958  . Smokeless tobacco: Never Used  . Alcohol use No  Allergies   Patient has no known allergies.   Review of Systems Review of Systems  Constitutional: Positive for activity change, appetite change and fatigue. Negative for chills and fever.  Respiratory: Positive for cough. Negative for chest tightness, shortness of breath and wheezing.   Cardiovascular: Negative for chest pain, palpitations and leg swelling.  Gastrointestinal: Negative for abdominal pain, constipation, diarrhea and vomiting.  Genitourinary: Negative for dysuria, flank pain and frequency.  Musculoskeletal: Negative for back pain, myalgias and neck pain.  Skin: Negative for rash and wound.  Neurological:  Negative for dizziness, weakness, light-headedness, numbness and headaches.  All other systems reviewed and are negative.    Physical Exam Updated Vital Signs BP (!) 98/58 (BP Location: Left Arm)   Pulse 74   Temp (!) 97.5 F (36.4 C) (Oral)   Resp 16   Ht 5\' 6"  (1.676 m)   Wt 40.9 kg (90 lb 2 oz)   SpO2 96%   BMI 14.55 kg/m   Physical Exam  Constitutional: She is oriented to person, place, and time. She appears well-developed and well-nourished. No distress.  HENT:  Head: Normocephalic and atraumatic.  Mouth/Throat: Oropharynx is clear and moist.  Eyes: Pupils are equal, round, and reactive to light. EOM are normal.  Neck: Normal range of motion. Neck supple. No JVD present.  Cardiovascular: Normal rate and regular rhythm.  Exam reveals no gallop and no friction rub.   No murmur heard. Pulmonary/Chest: Effort normal. She has rales.  Few rales in left lung field. There is no distress.  Abdominal: Soft. Bowel sounds are normal. There is no tenderness. There is no rebound and no guarding.  Musculoskeletal: Normal range of motion. She exhibits no edema or tenderness.  No lower extremity swelling, asymmetry or tenderness.  Neurological: She is alert and oriented to person, place, and time.  Moving all extremities without focal weakness. Sensation intact.  Skin: Skin is warm and dry. Capillary refill takes less than 2 seconds. No rash noted. No erythema.  Psychiatric: She has a normal mood and affect. Her behavior is normal.  Nursing note and vitals reviewed.    ED Treatments / Results  Labs (all labs ordered are listed, but only abnormal results are displayed) Labs Reviewed  COMPREHENSIVE METABOLIC PANEL - Abnormal; Notable for the following:       Result Value   Chloride 98 (*)    Glucose, Bld 101 (*)    Creatinine, Ser 1.16 (*)    ALT 11 (*)    GFR calc non Af Amer 45 (*)    GFR calc Af Amer 52 (*)    All other components within normal limits  CBC WITH  DIFFERENTIAL/PLATELET - Abnormal; Notable for the following:    Hemoglobin 15.6 (*)    HCT 46.1 (*)    All other components within normal limits  URINALYSIS, ROUTINE W REFLEX MICROSCOPIC    EKG  EKG Interpretation None       Radiology Dg Chest 2 View  Result Date: 05/19/2017 CLINICAL DATA:  Shortness of Breath EXAM: CHEST  2 VIEW COMPARISON:  May 10, 2017 FINDINGS: There is new airspace consolidation in the inferior lingula. There is a minimal left pleural effusion. There is mild scarring in the left apex, stable. The lungs elsewhere are clear. Heart size and pulmonary vascularity are normal. No adenopathy. No evident bone lesions. IMPRESSION: Airspace consolidation consistent with pneumonia in the inferior lingula. Minimal left pleural effusion. Stable scarring left apex. Lungs elsewhere clear. Stable cardiac silhouette.  Electronically Signed   By: Bretta Bang III M.D.   On: 05/19/2017 22:01    Procedures Procedures (including critical care time)  Medications Ordered in ED Medications  levofloxacin (LEVAQUIN) IVPB 500 mg (500 mg Intravenous New Bag/Given 05/19/17 2243)  sodium chloride 0.9 % bolus 500 mL (500 mLs Intravenous New Bag/Given 05/19/17 2243)     Initial Impression / Assessment and Plan / ED Course  I have reviewed the triage vital signs and the nursing notes.  Pertinent labs & imaging results that were available during my care of the patient were reviewed by me and considered in my medical decision making (see chart for details).     Given IV Levaquin and bolus of IV fluids. Will need reassessment for possible outpatient treatment.  Final Clinical Impressions(s) / ED Diagnoses   Final diagnoses:  Community acquired pneumonia, unspecified laterality    New Prescriptions New Prescriptions   No medications on file     Loren Racer, MD 05/21/17 2139

## 2017-05-19 NOTE — ED Notes (Signed)
Pt returned from xray

## 2017-05-19 NOTE — ED Triage Notes (Signed)
Pt c/o cough and caregiver states she is not eating well over the last couple of days.

## 2017-05-20 LAB — URINALYSIS, ROUTINE W REFLEX MICROSCOPIC
BILIRUBIN URINE: NEGATIVE
Bacteria, UA: NONE SEEN
Glucose, UA: NEGATIVE mg/dL
KETONES UR: NEGATIVE mg/dL
Leukocytes, UA: NEGATIVE
Nitrite: NEGATIVE
PH: 5 (ref 5.0–8.0)
PROTEIN: NEGATIVE mg/dL
Specific Gravity, Urine: 1.014 (ref 1.005–1.030)

## 2017-05-20 MED ORDER — SODIUM CHLORIDE 0.9 % IV BOLUS (SEPSIS)
500.0000 mL | Freq: Once | INTRAVENOUS | Status: AC
  Administered 2017-05-20: 500 mL via INTRAVENOUS

## 2017-05-20 MED ORDER — LEVOFLOXACIN 500 MG PO TABS: 500.0000 mg | ORAL_TABLET | Freq: Every day | ORAL | 0 refills | Status: DC

## 2017-05-20 NOTE — ED Provider Notes (Signed)
Pt improved She is ambulatory No hypoxia SBP>100 She requests d/c home Referred to PCP for eval in 2-3 weeks Given info on hematuria Discussed strict return precautions    Zadie RhineWickline, Ruth Estabrook, MD 05/20/17 0155

## 2017-05-20 NOTE — ED Notes (Signed)
Pt ambulated well in hall. Steady with no SOB. O2 stayed at 97/96.

## 2017-06-21 ENCOUNTER — Emergency Department (HOSPITAL_COMMUNITY): Payer: Medicare Other

## 2017-06-21 ENCOUNTER — Encounter (HOSPITAL_COMMUNITY): Payer: Self-pay | Admitting: *Deleted

## 2017-06-21 ENCOUNTER — Observation Stay (HOSPITAL_COMMUNITY): Payer: Medicare Other

## 2017-06-21 ENCOUNTER — Observation Stay (HOSPITAL_COMMUNITY)
Admission: EM | Admit: 2017-06-21 | Discharge: 2017-06-22 | Disposition: A | Discharge status: Home / Self Care | Payer: Medicare Other | Attending: Internal Medicine | Admitting: Internal Medicine

## 2017-06-21 DIAGNOSIS — F0390 Unspecified dementia without behavioral disturbance: Secondary | ICD-10-CM | POA: Diagnosis not present

## 2017-06-21 DIAGNOSIS — I951 Orthostatic hypotension: Secondary | ICD-10-CM

## 2017-06-21 DIAGNOSIS — Z7982 Long term (current) use of aspirin: Secondary | ICD-10-CM | POA: Insufficient documentation

## 2017-06-21 DIAGNOSIS — R55 Syncope and collapse: Secondary | ICD-10-CM | POA: Diagnosis present

## 2017-06-21 DIAGNOSIS — Z79899 Other long term (current) drug therapy: Secondary | ICD-10-CM | POA: Diagnosis not present

## 2017-06-21 DIAGNOSIS — I5042 Chronic combined systolic (congestive) and diastolic (congestive) heart failure: Secondary | ICD-10-CM

## 2017-06-21 DIAGNOSIS — J439 Emphysema, unspecified: Secondary | ICD-10-CM | POA: Diagnosis present

## 2017-06-21 DIAGNOSIS — Z720 Tobacco use: Secondary | ICD-10-CM | POA: Diagnosis present

## 2017-06-21 DIAGNOSIS — R110 Nausea: Secondary | ICD-10-CM | POA: Diagnosis not present

## 2017-06-21 DIAGNOSIS — S065XAA Traumatic subdural hemorrhage with loss of consciousness status unknown, initial encounter: Secondary | ICD-10-CM

## 2017-06-21 DIAGNOSIS — I6200 Nontraumatic subdural hemorrhage, unspecified: Secondary | ICD-10-CM | POA: Diagnosis not present

## 2017-06-21 DIAGNOSIS — Z7189 Other specified counseling: Secondary | ICD-10-CM

## 2017-06-21 DIAGNOSIS — Z515 Encounter for palliative care: Secondary | ICD-10-CM

## 2017-06-21 DIAGNOSIS — F17210 Nicotine dependence, cigarettes, uncomplicated: Secondary | ICD-10-CM | POA: Diagnosis not present

## 2017-06-21 DIAGNOSIS — R42 Dizziness and giddiness: Secondary | ICD-10-CM | POA: Diagnosis not present

## 2017-06-21 DIAGNOSIS — I2510 Atherosclerotic heart disease of native coronary artery without angina pectoris: Secondary | ICD-10-CM | POA: Insufficient documentation

## 2017-06-21 DIAGNOSIS — S065X9A Traumatic subdural hemorrhage with loss of consciousness of unspecified duration, initial encounter: Secondary | ICD-10-CM

## 2017-06-21 DIAGNOSIS — R404 Transient alteration of awareness: Secondary | ICD-10-CM | POA: Diagnosis not present

## 2017-06-21 DIAGNOSIS — I519 Heart disease, unspecified: Secondary | ICD-10-CM | POA: Diagnosis present

## 2017-06-21 DIAGNOSIS — J449 Chronic obstructive pulmonary disease, unspecified: Secondary | ICD-10-CM | POA: Insufficient documentation

## 2017-06-21 DIAGNOSIS — Z9119 Patient's noncompliance with other medical treatment and regimen: Secondary | ICD-10-CM

## 2017-06-21 DIAGNOSIS — Z91199 Patient's noncompliance with other medical treatment and regimen due to unspecified reason: Secondary | ICD-10-CM

## 2017-06-21 DIAGNOSIS — S065X0A Traumatic subdural hemorrhage without loss of consciousness, initial encounter: Secondary | ICD-10-CM | POA: Diagnosis not present

## 2017-06-21 LAB — CBC
HCT: 41.6 % (ref 36.0–46.0)
HEMOGLOBIN: 13.8 g/dL (ref 12.0–15.0)
MCH: 32.3 pg (ref 26.0–34.0)
MCHC: 33.2 g/dL (ref 30.0–36.0)
MCV: 97.4 fL (ref 78.0–100.0)
Platelets: 116 10*3/uL — ABNORMAL LOW (ref 150–400)
RBC: 4.27 MIL/uL (ref 3.87–5.11)
RDW: 13.8 % (ref 11.5–15.5)
WBC: 5.8 10*3/uL (ref 4.0–10.5)

## 2017-06-21 LAB — HEPATIC FUNCTION PANEL
ALBUMIN: 3.3 g/dL — AB (ref 3.5–5.0)
ALT: 10 U/L — AB (ref 14–54)
AST: 18 U/L (ref 15–41)
Alkaline Phosphatase: 78 U/L (ref 38–126)
BILIRUBIN DIRECT: 0.1 mg/dL (ref 0.1–0.5)
Indirect Bilirubin: 0.4 mg/dL (ref 0.3–0.9)
TOTAL PROTEIN: 5.8 g/dL — AB (ref 6.5–8.1)
Total Bilirubin: 0.5 mg/dL (ref 0.3–1.2)

## 2017-06-21 LAB — URINALYSIS, ROUTINE W REFLEX MICROSCOPIC
Bacteria, UA: NONE SEEN
Bilirubin Urine: NEGATIVE
Glucose, UA: NEGATIVE mg/dL
Ketones, ur: 5 mg/dL — AB
Leukocytes, UA: NEGATIVE
Nitrite: NEGATIVE
Protein, ur: NEGATIVE mg/dL
SPECIFIC GRAVITY, URINE: 1.008 (ref 1.005–1.030)
Squamous Epithelial / LPF: NONE SEEN
pH: 6 (ref 5.0–8.0)

## 2017-06-21 LAB — BASIC METABOLIC PANEL
ANION GAP: 10 (ref 5–15)
BUN: 10 mg/dL (ref 6–20)
CALCIUM: 8.8 mg/dL — AB (ref 8.9–10.3)
CO2: 26 mmol/L (ref 22–32)
Chloride: 105 mmol/L (ref 101–111)
Creatinine, Ser: 1 mg/dL (ref 0.44–1.00)
GFR calc Af Amer: >60 mL/min (ref >60)
GFR calc non Af Amer: 54 mL/min — ABNORMAL LOW (ref >60)
GLUCOSE: 121 mg/dL — AB (ref 65–99)
Potassium: 3.4 mmol/L — ABNORMAL LOW (ref 3.5–5.1)
Sodium: 141 mmol/L (ref 135–145)

## 2017-06-21 LAB — TROPONIN I

## 2017-06-21 LAB — I-STAT CG4 LACTIC ACID, ED: LACTIC ACID, VENOUS: 1.16 mmol/L (ref 0.5–1.9)

## 2017-06-21 MED ORDER — SODIUM CHLORIDE 0.9 % IV SOLN
INTRAVENOUS | Status: DC
  Administered 2017-06-21: 19:00:00 via INTRAVENOUS

## 2017-06-21 MED ORDER — NITROGLYCERIN 0.4 MG SL SUBL: 0.4000 mg | SUBLINGUAL_TABLET | SUBLINGUAL | Status: DC | PRN

## 2017-06-21 MED ORDER — HEPARIN SODIUM (PORCINE) 5000 UNIT/ML IJ SOLN: 5000.0000 [IU] | Freq: Three times a day (TID) | INTRAMUSCULAR | Status: DC

## 2017-06-21 MED ORDER — ONDANSETRON HCL 4 MG/2ML IJ SOLN
4.0000 mg | Freq: Once | INTRAMUSCULAR | Status: AC
Start: 2017-06-21 — End: 2017-06-21
  Administered 2017-06-21: 4 mg via INTRAVENOUS
  Filled 2017-06-21: qty 2

## 2017-06-21 MED ORDER — POTASSIUM CHLORIDE CRYS ER 20 MEQ PO TBCR
EXTENDED_RELEASE_TABLET | ORAL | Status: AC
  Administered 2017-06-21: 20 meq
  Filled 2017-06-21: qty 1

## 2017-06-21 MED ORDER — LORAZEPAM 2 MG/ML IJ SOLN
0.5000 mg | Freq: Once | INTRAMUSCULAR | Status: AC
  Administered 2017-06-21: 0.5 mg via INTRAVENOUS
  Filled 2017-06-21: qty 1

## 2017-06-21 MED ORDER — CARVEDILOL 3.125 MG PO TABS: 3.1250 mg | ORAL_TABLET | Freq: Two times a day (BID) | ORAL | Status: DC

## 2017-06-21 MED ORDER — SODIUM CHLORIDE 0.9 % IV BOLUS (SEPSIS)
500.0000 mL | Freq: Once | INTRAVENOUS | Status: AC
  Administered 2017-06-21: 500 mL via INTRAVENOUS

## 2017-06-21 MED ORDER — ATORVASTATIN CALCIUM 40 MG PO TABS
40.0000 mg | ORAL_TABLET | Freq: Every day | ORAL | Status: DC
  Administered 2017-06-21: 40 mg via ORAL
  Filled 2017-06-21: qty 1

## 2017-06-21 MED ORDER — POTASSIUM CHLORIDE CRYS ER 20 MEQ PO TBCR
20.0000 meq | EXTENDED_RELEASE_TABLET | Freq: Every day | ORAL | Status: DC
  Administered 2017-06-21 – 2017-06-22 (×2): 20 meq via ORAL
  Filled 2017-06-21 (×2): qty 1

## 2017-06-21 NOTE — ED Provider Notes (Signed)
[facility] Provider Note   CSN: [id number] Arrival date & time: 06/21/17  1130     History   Chief Complaint Chief Complaint  Patient presents with  . Dizziness    HPI Ruth Davidson is a 76 y.o. female.  HPI Patient is a poor historian. History of dementia. Level V caveat applies. Per daughter who is at bedside, patient was in her normal state of health and began to experience acute onset dizziness starting at 10:30 this morning. Described dizziness has all of balance associated with nausea. Daughter states patient was initially complaining of some ringing in her ear. She did not notice any focal weakness or speech changes. EMS was called for transport to the emergency department. Patient is currently only complaining of being cold. Past Medical History:  Diagnosis Date  . Cardiac cachexia   . CHF (congestive heart failure) (HCC)   . COPD (chronic obstructive pulmonary disease) (HCC)   . Coronary artery disease   . Dementia   . MI (myocardial infarction) (HCC)   . Non-compliance   . Thyroid disease   . Tobacco use     Patient Active Problem List   Diagnosis Date Noted  . Orthostatic hypotension 06/22/2017  . Chronic combined systolic and diastolic CHF (congestive heart failure) (HCC) 06/22/2017  . Goals of care, counseling/discussion   . Palliative care encounter   . Pre-syncope 06/21/2017  . Subdural hematoma (HCC) 06/21/2017  . Acute on chronic systolic CHF (congestive heart failure) (HCC) 08/28/2016  . Noncompliance 08/28/2016  . 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 Emphysema of lung (HCC) 06/11/2016  . 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 Dementia 06/11/2016  . NSVT (nonsustained ventricular tachycardia) (HCC) 04/15/2016  . Acute systolic CHF (congestive heart failure) (HCC) 04/15/2016  . Community acquired pneumonia   . CAD S/P remote PCI 08/30/2013  . Hyperlipidemia 08/30/2013  . Tobacco abuse 08/30/2013    Past Surgical History:    Procedure Laterality Date  . CHOLECYSTECTOMY    . CORONARY ANGIOPLASTY WITH STENT PLACEMENT  2003  . THYROIDECTOMY      OB History    No data available       Home Medications    Prior to Admission medications   Medication Sig Start Date End Date Taking? Authorizing Provider  carvedilol (COREG) 3.125 MG tablet Take 1 tablet (3.125 mg total) by mouth 2 (two) times daily with a meal. 09/04/16  Yes Laqueta Linden, MD  KLOR-CON M20 20 MEQ tablet TAKE ONE TABLET BY MOUTH ONCE DAILY 04/05/17  Yes Laqueta Linden, MD  nitroGLYCERIN (NITROSTAT) 0.4 MG SL tablet Place 1 tablet (0.4 mg total) under the tongue every 5 (five) minutes as needed for chest pain (or back pain or arm pain). 11/10/15  Yes Dione Booze, MD  Phenyleph-CPM-DM-Aspirin (ALKA-SELTZER PLUS COLD & COUGH PO) Take by mouth daily as needed (for cough).   Yes [provider]  Pseudoephedrine-Acetaminophen (SUDAFED SINUS PO) Take 1 tablet by mouth daily as needed (for cough).   Yes [provider]  simvastatin (ZOCOR) 80 MG tablet Take 1 tablet (80 mg total) by mouth at bedtime. 09/22/16  Yes Laqueta Linden, MD  aspirin 81 MG chewable tablet Chew 1 tablet (81 mg total) by mouth daily. Restart on 06/28/17 06/28/17   Catarina Hartshorn, MD  furosemide (LASIX) 40 MG tablet Take 1 tablet (40 mg total) by mouth daily. 09/22/16 05/19/17  Laqueta Linden, MD    Family History Family History  Problem Relation Age  of Onset  . Hypertension Father        Died in war    Social History Social History  Substance Use Topics  . Smoking status: Current Every Day Smoker    Packs/day: 0.25    Years: 50.00    Types: Cigarettes    Start date: 11/02/1958  . Smokeless tobacco: Never Used  . Alcohol use No     Allergies   Patient has no known allergies.   Review of Systems Review of Systems  Unable to perform ROS: Dementia     Physical Exam Updated Vital Signs BP (!) 113/47 (BP Location: Left Arm)   Pulse  71   Temp 97.6 F (36.4 C) (Oral)   Resp 19   Ht 5\' 6"  (1.676 m)   Wt 40.8 kg (89 lb 15.2 oz)   SpO2 100%   BMI 14.52 kg/m   Physical Exam  Constitutional: She appears well-developed and well-nourished. No distress.  HENT:  Head: Normocephalic and atraumatic.  Mouth/Throat: Oropharynx is clear and moist. No oropharyngeal exudate.  Unable to visualize TMs bilaterally due to impacted cerumen.  Eyes: Pupils are equal, round, and reactive to light. EOM are normal.  Non fatigable horizontal nystagmus  Neck: Normal range of motion. Neck supple.  Cardiovascular: Normal rate and regular rhythm.  Exam reveals no gallop and no friction rub.   No murmur heard. Pulmonary/Chest: Effort normal and breath sounds normal. No respiratory distress. She has no wheezes. She has no rales. She exhibits no tenderness.  Abdominal: Soft. Bowel sounds are normal. There is no tenderness. There is no rebound and no guarding.  Musculoskeletal: Normal range of motion. She exhibits no edema or tenderness.  No lower extremity swelling, asymmetry or tenderness.  Neurological: She is alert.  Patient is answering simple questions and follow commands. 4/5 motor in all extremities. Sensation is grossly intact. Patient does have some mild ataxia with the left upper extremity with finger to nose testing.  Skin: Skin is warm and dry. Capillary refill takes less than 2 seconds. No rash noted. No erythema.  Psychiatric: She has a normal mood and affect. Her behavior is normal.  Nursing note and vitals reviewed.    ED Treatments / Results  Labs (all labs ordered are listed, but only abnormal results are displayed) Labs Reviewed  BASIC METABOLIC PANEL - Abnormal; Notable for the following:       Result Value   Potassium 3.4 (*)    Glucose, Bld 121 (*)    Calcium 8.8 (*)    GFR calc non Af Amer 54 (*)    All other components within normal limits  CBC - Abnormal; Notable for the following:    Platelets 116 (*)    All  other components within normal limits  URINALYSIS, ROUTINE W REFLEX MICROSCOPIC - Abnormal; Notable for the following:    Hgb urine dipstick MODERATE (*)    Ketones, ur 5 (*)    All other components within normal limits  HEPATIC FUNCTION PANEL - Abnormal; Notable for the following:    Total Protein 5.8 (*)    Albumin 3.3 (*)    ALT 10 (*)    All other components within normal limits  CBC - Abnormal; Notable for the following:    Platelets 135 (*)    All other components within normal limits  COMPREHENSIVE METABOLIC PANEL - Abnormal; Notable for the following:    Chloride 115 (*)    Calcium 8.3 (*)    Total Protein  4.9 (*)    Albumin 2.7 (*)    ALT 10 (*)    Anion gap 4 (*)    All other components within normal limits  CULTURE, BLOOD (ROUTINE X 2)  CULTURE, BLOOD (ROUTINE X 2)  TROPONIN I  I-STAT CG4 LACTIC ACID, ED    EKG  EKG Interpretation  Date/Time:  Monday June 21 2017 11:36:47 EDT Ventricular Rate:  71 PR Interval:    QRS Duration: 136 QT Interval:  449 QTC Calculation: 488 R Axis:   -27 Text Interpretation:  Sinus rhythm LVH with secondary repolarization abnormality Inferior infarct, old Anterior Q waves, possibly due to LVH Confirmed by Ranae Palms  MD, Katina Remick ([id number]) on 06/21/2017 12:11:25 PM       Radiology No results found.  Procedures Procedures (including critical care time)  Medications Ordered in ED Medications  sodium chloride 0.9 % bolus 500 mL (0 mLs Intravenous Stopped 06/21/17 1352)  LORazepam (ATIVAN) injection 0.5 mg (0.5 mg Intravenous Given 06/21/17 1251)  ondansetron (ZOFRAN) injection 4 mg (4 mg Intravenous Given 06/21/17 1251)  potassium chloride SA (K-DUR,KLOR-CON) 20 MEQ CR tablet (20 mEq  Given 06/21/17 2247)     Initial Impression / Assessment and Plan / ED Course  I have reviewed the triage vital signs and the nursing notes.  Pertinent labs & imaging results that were available during my care of the patient were reviewed by me and  considered in my medical decision making (see chart for details).     No evidence of stroke on MRI. Patient does have bilateral small subdural hematomas. Discussed with Dr. Lovell Sheehan who reviewed the patient's MRI. Suggested if the patient were at her normal mental baseline she can follow-up as outpatient or be admitted for observation and repeat imaging in the morning. Patient continues to be drowsy after Ativan given for MRI. Blood pressure has been borderline hypertensive. Blood cultures drawn. Normal lactic acid. No evidence of infection. Discussed with hospitalist will see patient in the emergency department and admit.  Final Clinical Impressions(s) / ED Diagnoses   Final diagnoses:  Vertigo  Subdural hematoma [facility])    New Prescriptions Discharge Medication List as of 06/22/2017  3:33 PM       Loren Racer, MD 06/25/17 1309

## 2017-06-21 NOTE — ED Triage Notes (Signed)
Pt was given 4 mg zofran in route. CBG 100.

## 2017-06-21 NOTE — ED Notes (Signed)
Ruth Davidson made [facility] aware of symptoms and gave him EKG

## 2017-06-21 NOTE — ED Triage Notes (Signed)
Pt comes in with dizziness starting around 1030 today. Pt has nausea and vomiting. She is alert.

## 2017-06-21 NOTE — H&P (Addendum)
History and Physical    Ruth Davidson ZOX:[id number] DOB: Nov 17, 1940 DOA: 06/21/2017  Referring MD/NP/PA: Ranae Palms  PCP: Patient, No Pcp Per  Outpatient Specialists: NSLovell Sheehan  Patient coming from: home   Chief Complaint: presyncope, SDH, Encephalopathy    HPI: Ruth Davidson is a 76 y.o. female with medical history significant of CHF, COPD, CAD presenting w/ presyncope, SHD, encephalopathy. History primarily from pt's daughter as pt is lethargic s/p ativan. Per the daughter, patient was at home resting when she developed sudden onset of dizziness and lethargy home. No loss of consciousness no reported head trauma. No reported hemiparesis or confusion. Symptoms persisted mildly over the course of the day. No reported chest pain, shortness of breath, abdominal pain, nausea vomiting or diarrhea. Tobacco abuse.   ED Course: Presented to ER T 97.5. SBP 90s-100s. Labs grossly WNL. MRI brain obtained showing: small bilateral subdural hematomas, 4-5 mm each. No midline shift or loss of basilar cisterns.  Case discussed w/ Dr. Lovell Sheehan per EDP. Will observe w/ plan for f/u CT head in am. Given IV ativan prior to MRI.    Review of Systems: As per HPI otherwise 10 point review of systems negative.    Past Medical History:  Diagnosis Date  . Cardiac cachexia   . CHF (congestive heart failure) (HCC)   . COPD (chronic obstructive pulmonary disease) (HCC)   . Coronary artery disease   . Dementia   . MI (myocardial infarction) (HCC)   . Non-compliance   . Thyroid disease   . Tobacco use     Past Surgical History:  Procedure Laterality Date  . CHOLECYSTECTOMY    . CORONARY ANGIOPLASTY WITH STENT PLACEMENT  2003  . THYROIDECTOMY       reports that she has been smoking Cigarettes.  She started smoking about 58 years ago. She has a 12.50 pack-year smoking history. She has never used smokeless tobacco. She reports that she does not drink alcohol or use drugs.  No Known Allergies  Family  History  Problem Relation Age of Onset  . Hypertension Father        Died in war     Prior to Admission medications   Medication Sig Start Date End Date Taking? Authorizing Provider  aspirin 81 MG chewable tablet Chew 1 tablet (81 mg total) by mouth daily. 04/18/16  Yes Elgergawy, Leana Roe, MD  carvedilol (COREG) 3.125 MG tablet Take 1 tablet (3.125 mg total) by mouth 2 (two) times daily with a meal. 09/04/16  Yes Laqueta Linden, MD  KLOR-CON M20 20 MEQ tablet TAKE ONE TABLET BY MOUTH ONCE DAILY 04/05/17  Yes Laqueta Linden, MD  nitroGLYCERIN (NITROSTAT) 0.4 MG SL tablet Place 1 tablet (0.4 mg total) under the tongue every 5 (five) minutes as needed for chest pain (or back pain or arm pain). 11/10/15  Yes Dione Booze, MD  Phenyleph-CPM-DM-Aspirin (ALKA-SELTZER PLUS COLD & COUGH PO) Take by mouth daily as needed (for cough).   Yes [provider]  Pseudoephedrine-Acetaminophen (SUDAFED SINUS PO) Take 1 tablet by mouth daily as needed (for cough).   Yes [provider]  simvastatin (ZOCOR) 80 MG tablet Take 1 tablet (80 mg total) by mouth at bedtime. 09/22/16  Yes Laqueta Linden, MD  cephALEXin (KEFLEX) 250 MG capsule Take 1 capsule (250 mg total) by mouth 4 (four) times daily. Patient not taking: Reported on 06/21/2017 05/10/17   Donnetta Hutching, MD  furosemide (LASIX) 40 MG tablet Take 1 tablet (40 mg  total) by mouth daily. 09/22/16 05/19/17  Laqueta Linden, MD  levofloxacin (LEVAQUIN) 500 MG tablet Take 1 tablet (500 mg total) by mouth daily. Patient not taking: Reported on 06/21/2017 05/20/17   Zadie Rhine, MD    Physical Exam: Vitals:   06/21/17 1630 06/21/17 1645 06/21/17 1700 06/21/17 1800  BP: 104/65 102/65 105/62 102/62  Pulse: (!) 106 95 (!) 106 82  Resp: 20 18 (!) 25 16  Temp:      TempSrc:      SpO2: 90% 94% 98% 99%  Weight:      Height:          Constitutional:lethargic, mildly responsive to sternal rub  Vitals:   06/21/17 1630  06/21/17 1645 06/21/17 1700 06/21/17 1800  BP: 104/65 102/65 105/62 102/62  Pulse: (!) 106 95 (!) 106 82  Resp: 20 18 (!) 25 16  Temp:      TempSrc:      SpO2: 90% 94% 98% 99%  Weight:      Height:       Eyes: PERRL, lids and conjunctivae normal ENMT: Mucous membranes are moist. Posterior pharynx clear of any exudate or lesions.Normal dentition.  Neck: normal, supple, no masses, no thyromegaly Respiratory: clear to auscultation bilaterally, no wheezing, no crackles. Normal respiratory effort. No accessory muscle use.  Cardiovascular: Regular rate and rhythm, no murmurs / rubs / gallops. No extremity edema. 2+ pedal pulses. No carotid bruits.  Abdomen: no tenderness, no masses palpated. No hepatosplenomegaly. Bowel sounds positive.  Musculoskeletal: no clubbing / cyanosis. No joint deformity upper and lower extremities. Good ROM, no contractures. Normal muscle tone.  Skin: no rashes, lesions, ulcers. No induration Neurologic: CN 2-12 grossly intact. Sensation intact, DTR normal. Strength 5/5 in all 4.  Psychiatric: Normal judgment and insight. Alert and oriented x 3. Normal mood.    Labs on Admission: I have personally reviewed following labs and imaging studies  CBC:  Recent Labs Lab 06/21/17 1220  WBC 5.8  HGB 13.8  HCT 41.6  MCV 97.4  PLT 116*   Basic Metabolic Panel:  Recent Labs Lab 06/21/17 1220  NA 141  K 3.4*  CL 105  CO2 26  GLUCOSE 121*  BUN 10  CREATININE 1.00  CALCIUM 8.8*   GFR: Estimated Creatinine Clearance: 31.3 mL/min (by C-G formula based on SCr of 1 mg/dL). Liver Function Tests:  Recent Labs Lab 06/21/17 1231  AST 18  ALT 10*  ALKPHOS 78  BILITOT 0.5  PROT 5.8*  ALBUMIN 3.3*   No results for input(s): LIPASE, AMYLASE in the last 168 hours. No results for input(s): AMMONIA in the last 168 hours. Coagulation Profile: No results for input(s): INR, PROTIME in the last 168 hours. Cardiac Enzymes:  Recent Labs Lab 06/21/17 1231    TROPONINI <0.03   BNP (last 3 results) No results for input(s): PROBNP in the last 8760 hours. HbA1C: No results for input(s): HGBA1C in the last 72 hours. CBG: No results for input(s): GLUCAP in the last 168 hours. Lipid Profile: No results for input(s): CHOL, HDL, LDLCALC, TRIG, CHOLHDL, LDLDIRECT in the last 72 hours. Thyroid Function Tests: No results for input(s): TSH, T4TOTAL, FREET4, T3FREE, THYROIDAB in the last 72 hours. Anemia Panel: No results for input(s): VITAMINB12, FOLATE, FERRITIN, TIBC, IRON, RETICCTPCT in the last 72 hours. Urine analysis:    Component Value Date/Time   COLORURINE YELLOW 06/21/2017 1156   APPEARANCEUR CLEAR 06/21/2017 1156   LABSPEC 1.008 06/21/2017 1156   PHURINE 6.0 06/21/2017  1156   GLUCOSEU NEGATIVE 06/21/2017 1156   HGBUR MODERATE (A) 06/21/2017 1156   BILIRUBINUR NEGATIVE 06/21/2017 1156   KETONESUR 5 (A) 06/21/2017 1156   PROTEINUR NEGATIVE 06/21/2017 1156   NITRITE NEGATIVE 06/21/2017 1156   LEUKOCYTESUR NEGATIVE 06/21/2017 1156   Sepsis Labs:  Recent Results (from the past 240 hour(s))  Culture, blood (Routine X 2) w Reflex to ID Panel     Status: None (Preliminary result)   Collection Time: 06/21/17  5:03 PM  Result Value Ref Range Status   Specimen Description RIGHT ANTECUBITAL  Final   Special Requests   Final    BOTTLES DRAWN AEROBIC AND ANAEROBIC Blood Culture adequate volume   Culture PENDING  Incomplete   Report Status PENDING  Incomplete  Culture, blood (Routine X 2) w Reflex to ID Panel     Status: None (Preliminary result)   Collection Time: 06/21/17  5:09 PM  Result Value Ref Range Status   Specimen Description BLOOD RIGHT WRIST  Final   Special Requests   Final    BOTTLES DRAWN AEROBIC AND ANAEROBIC Blood Culture adequate volume   Culture PENDING  Incomplete   Report Status PENDING  Incomplete     Radiological Exams on Admission: Mr Brain Wo Contrast  Addendum Date: 06/21/2017   ADDENDUM REPORT: 06/21/2017  13:47 ADDENDUM: Study discussed by telephone with Dr. Loren Racer on 06/21/2017 at 1340 hours. Electronically Signed   By: Odessa Fleming M.D.   On: 06/21/2017 13:47   Result Date: 06/21/2017 CLINICAL DATA:  76 year old female with dizziness since 1030 hours. Nausea and vomiting. EXAM: MRI HEAD WITHOUT CONTRAST TECHNIQUE: Multiplanar, multiecho pulse sequences of the brain and surrounding structures were obtained without intravenous contrast. COMPARISON:  Head CT without contrast 07/21/2005. FINDINGS: Brain: Small bilateral subdural hematomas, measuring about 4 mm over both cerebral convexities. Series 10 image 30 and series 13 images 14 and 15. No midline shift and basilar cisterns remain patent. No intraventricular or other intracranial hemorrhage identified. No restricted diffusion to suggest acute infarction. No ventriculomegaly. Cervicomedullary junction and pituitary are within normal limits. Mild for age scattered nonspecific cerebral white matter T2 and FLAIR hyperintensity, more so in the left frontal lobe. No cortical encephalomalacia or parenchymal blood products identified. Deep gray matter nuclei, brainstem, and cerebellum appear normal. Vascular: Major intracranial vascular flow voids are preserved. Skull and upper cervical spine: Negative. Sinuses/Orbits: Negative. Other: Mild left mastoid effusion is new since 2006. Negative nasopharynx. Right mastoids are clear. Negative scalp soft tissues. IMPRESSION: 1. Positive for small bilateral subdural hematomas, 4-5 mm each. No midline shift or loss of basilar cisterns. 2. Otherwise largely unremarkable for age noncontrast MRI appearance of the brain. 3. Mild left mastoid effusion, likely postinflammatory. Electronically Signed: By: Odessa Fleming M.D. On: 06/21/2017 13:35   Dg Chest Port 1 View  Result Date: 06/21/2017 CLINICAL DATA:  Dizziness. EXAM: PORTABLE CHEST 1 VIEW COMPARISON:  May 19, 2017 FINDINGS: The left-sided consolidation seen previously has  resolved. No pneumothorax. The cardiomediastinal silhouette is normal. No pulmonary nodules, masses, focal infiltrates, or overt edema. IMPRESSION: No active disease. Electronically Signed   By: Gerome Sam III M.D   On: 06/21/2017 17:26    EKG: Independently reviewed. NSR  Assessment/Plan Active Problems:   Pre-syncope   SDH (subdural hematoma) (HCC)    1-Pre-Syncope/SDH -lethargic on exam, though non focal  -MRI negative for any acute findings apart from small bilateral SDH -orthostatic BPs  -IVF hydration  -defer AC -f/u CT head in  am  -neuro precautions -f/u w/ NS in am as clinically indicated   2-CAD/CHF -2D ECHO 10/2016: 10% to 15%. Diffuse hypokinesis -mildly dry on exam  -no active CP  -follow volume status  -titrate home regimen  -strict Is and Os  -daily weights      DVT prophylaxis: SCDs  Code Status: Full Code   Family Communication: Daughter at bedside   Disposition Plan: Pending further evaluation  Consults called: NS aware Lovell Sheehan)  Admission status: Inpt     Floydene Flock MD [facility] Pager [phone]  If 7PM-7AM, please contact night-coverage [email or web address] Password [facility]  06/21/2017, 6:52 PM

## 2017-06-21 NOTE — ED Notes (Signed)
Hospitalist at bedside 

## 2017-06-21 NOTE — ED Notes (Signed)
Pt placed on 2L oxygen for comfort 

## 2017-06-21 NOTE — ED Notes (Signed)
Pt being transported to MRI.

## 2017-06-22 ENCOUNTER — Encounter (HOSPITAL_COMMUNITY): Payer: Self-pay | Admitting: Primary Care

## 2017-06-22 ENCOUNTER — Observation Stay (HOSPITAL_BASED_OUTPATIENT_CLINIC_OR_DEPARTMENT_OTHER): Payer: Medicare Other

## 2017-06-22 ENCOUNTER — Observation Stay (HOSPITAL_COMMUNITY): Payer: Medicare Other

## 2017-06-22 DIAGNOSIS — J438 Other emphysema: Secondary | ICD-10-CM | POA: Diagnosis not present

## 2017-06-22 DIAGNOSIS — R55 Syncope and collapse: Secondary | ICD-10-CM

## 2017-06-22 DIAGNOSIS — Z7189 Other specified counseling: Secondary | ICD-10-CM | POA: Diagnosis not present

## 2017-06-22 DIAGNOSIS — I951 Orthostatic hypotension: Secondary | ICD-10-CM | POA: Diagnosis not present

## 2017-06-22 DIAGNOSIS — F0390 Unspecified dementia without behavioral disturbance: Secondary | ICD-10-CM | POA: Diagnosis not present

## 2017-06-22 DIAGNOSIS — I6200 Nontraumatic subdural hemorrhage, unspecified: Secondary | ICD-10-CM

## 2017-06-22 DIAGNOSIS — I5042 Chronic combined systolic (congestive) and diastolic (congestive) heart failure: Secondary | ICD-10-CM | POA: Diagnosis not present

## 2017-06-22 DIAGNOSIS — R42 Dizziness and giddiness: Secondary | ICD-10-CM | POA: Diagnosis not present

## 2017-06-22 DIAGNOSIS — I519 Heart disease, unspecified: Secondary | ICD-10-CM

## 2017-06-22 DIAGNOSIS — Z720 Tobacco use: Secondary | ICD-10-CM | POA: Diagnosis not present

## 2017-06-22 DIAGNOSIS — Z9119 Patient's noncompliance with other medical treatment and regimen: Secondary | ICD-10-CM

## 2017-06-22 DIAGNOSIS — Z515 Encounter for palliative care: Secondary | ICD-10-CM | POA: Diagnosis not present

## 2017-06-22 LAB — ECHOCARDIOGRAM COMPLETE
E decel time: 257 msec
E/e' ratio: 21.16
FS: 11 % — AB (ref 28–44)
Height: 66 in
IVS/LV PW RATIO, ED: 0.98
LA ID, A-P, ES: 25 mm
LA diam end sys: 25 mm
LA diam index: 1.84 cm/m2
LA vol A4C: 35.6 ml
LA vol index: 30.1 mL/m2
LA vol: 40.9 mL
LV E/e' medial: 21.16
LV E/e'average: 21.16
LV PW d: 10.2 mm — AB (ref 0.6–1.1)
LV dias vol index: 85 mL/m2
LV dias vol: 116 mL — AB (ref 46–106)
LV e' LATERAL: 3.37 cm/s
LV sys vol index: 70 mL/m2
LV sys vol: 95 mL — AB
LVOT SV: 44 mL
LVOT VTI: 15.6 cm
LVOT area: 2.84 cm2
LVOT diameter: 19 mm
LVOT peak grad rest: 2 mmHg
LVOT peak vel: 68.7 cm/s
MV Dec: 257
MV Peak grad: 2 mmHg
MV pk A vel: 139 m/s
MV pk E vel: 71.3 m/s
Simpson's disk: 18
Stroke v: 21 ml
TAPSE: 17.5 mm
TDI e' lateral: 3.37
TDI e' medial: 4.68
Weight: 1439.16 oz

## 2017-06-22 LAB — CBC
HCT: 39.2 % (ref 36.0–46.0)
Hemoglobin: 12.8 g/dL (ref 12.0–15.0)
MCH: 32.4 pg (ref 26.0–34.0)
MCHC: 32.7 g/dL (ref 30.0–36.0)
MCV: 99.2 fL (ref 78.0–100.0)
PLATELETS: 135 10*3/uL — AB (ref 150–400)
RBC: 3.95 MIL/uL (ref 3.87–5.11)
RDW: 14.1 % (ref 11.5–15.5)
WBC: 6 10*3/uL (ref 4.0–10.5)

## 2017-06-22 LAB — COMPREHENSIVE METABOLIC PANEL
ALT: 10 U/L — AB (ref 14–54)
ANION GAP: 4 — AB (ref 5–15)
AST: 15 U/L (ref 15–41)
Albumin: 2.7 g/dL — ABNORMAL LOW (ref 3.5–5.0)
Alkaline Phosphatase: 68 U/L (ref 38–126)
BUN: 9 mg/dL (ref 6–20)
CALCIUM: 8.3 mg/dL — AB (ref 8.9–10.3)
CHLORIDE: 115 mmol/L — AB (ref 101–111)
CO2: 25 mmol/L (ref 22–32)
CREATININE: 0.87 mg/dL (ref 0.44–1.00)
Glucose, Bld: 74 mg/dL (ref 65–99)
Potassium: 3.8 mmol/L (ref 3.5–5.1)
SODIUM: 144 mmol/L (ref 135–145)
Total Bilirubin: 0.6 mg/dL (ref 0.3–1.2)
Total Protein: 4.9 g/dL — ABNORMAL LOW (ref 6.5–8.1)

## 2017-06-22 MED ORDER — ONDANSETRON HCL 4 MG/2ML IJ SOLN: 4.0000 mg | Freq: Four times a day (QID) | INTRAMUSCULAR | Status: DC | PRN

## 2017-06-22 MED ORDER — ACETAMINOPHEN 325 MG PO TABS
650.0000 mg | ORAL_TABLET | Freq: Four times a day (QID) | ORAL | Status: DC | PRN
  Administered 2017-06-22: 650 mg via ORAL
  Filled 2017-06-22: qty 2

## 2017-06-22 MED ORDER — ASPIRIN 81 MG PO CHEW: 81.0000 mg | CHEWABLE_TABLET | Freq: Every day | ORAL | 0 refills | Status: DC

## 2017-06-22 NOTE — Care Management Note (Addendum)
Case Management Note  Patient Details  Name: Ruth Davidson MRN: [id number] Date of Birth: 08-Oct-1941  Subjective/Objective:    Adm with pre-syncope/SDH. From home with daughter and son in law. Daughter present at bedside. Ind with ADL's PTA. Patient  alert, answers questions appropriately at this time. PT recommends supervision at home. Daughter aware. CM will give private duty list to daughter. Also gave list of PCP providers in area.  Palliative NP consulted for goals of care.          Action/Plan: Anticipate DC home with daughter.   Expected Discharge Date:     06/22/2017             Expected Discharge Plan:  Home/Self Care  In-House Referral:  Hospice / Palliative Care  Discharge planning Services  CM Consult  Post Acute Care Choice:    Choice offered to:  Patient, Adult Children  DME Arranged:    DME Agency:     HH Arranged:    HH Agency:     Status of Service:  Completed, signed off  If discussed at [employer] of Stay Meetings, dates discussed:    Additional Comments:  Zamariyah Furukawa, Chrystine Oiler, RN 06/22/2017, 11:29 AM

## 2017-06-22 NOTE — Evaluation (Deleted)
Physical Therapy Evaluation Patient Details Name: Ruth Davidson MRN: [id number] DOB: 05/23/1941 Today's Date: 06/22/2017   History of Present Illness  Ruth Davidson is a 76 y.o. female with medical history significant of CHF, COPD, CAD presenting w/ presyncope, SHD, encephalopathy. History primarily from pt's daughter as pt is lethargic s/p ativan. Per the daughter, patient was at home resting when she developed sudden onset of dizziness and lethargy home. No loss of consciousness no reported head trauma. No reported hemiparesis or confusion. Symptoms persisted mildly over the course of the day. No reported chest pain, shortness of breath, abdominal pain, nausea vomiting or diarrhea. Tobacco abuse.    Clinical Impression  Pt admitted with above diagnosis. Pt currently with functional limitations due to the deficits listed below;  Generalized weakness, slightly unsteady gait, and fatigue.  Pt will benefit from skilled PT to increase their independence and safety with mobility to allow discharge to home with family to supervise.   Follow Up Recommendations      Equipment Recommendations       Recommendations for Other Services       Precautions / Restrictions Precautions Precautions: Fall Precaution Comments: mild fall risk Restrictions Weight Bearing Restrictions: No      Mobility  Bed Mobility Overal bed mobility: Modified Independent                Transfers Overall transfer level: Modified independent Equipment used: None                Ambulation/Gait Ambulation/Gait assistance: Supervision Ambulation Distance (Feet): 100 Feet Assistive device: None Gait Pattern/deviations: Decreased stride length   Gait velocity interpretation: at or above normal speed for age/gender General Gait Details: Decreased arm swing, no loss balance, slightly unsteady  Stairs            Wheelchair Mobility    Modified Rankin (Stroke Patients Only)       Balance  Overall balance assessment: No apparent balance deficits (not formally assessed)                                           Pertinent Vitals/Pain Pain Assessment: No/denies pain    Home Living Family/patient expects to be discharged to:: Private residence Living Arrangements: Children Available Help at Discharge: Family Type of Home: Mobile home Home Access: Stairs to enter Entrance Stairs-Rails: None Entrance Stairs-Number of Steps: 3 Home Layout: One level Home Equipment: None      Prior Function Level of Independence: Independent               Hand Dominance        Extremity/Trunk Assessment   Upper Extremity Assessment Upper Extremity Assessment: Overall WFL for tasks assessed    Lower Extremity Assessment Lower Extremity Assessment: Overall WFL for tasks assessed    Cervical / Trunk Assessment Cervical / Trunk Assessment: Normal  Communication   Communication: No difficulties  Cognition Arousal/Alertness: Awake/alert Behavior During Therapy: WFL for tasks assessed/performed Overall Cognitive Status: Within Functional Limits for tasks assessed                                        General Comments      Exercises     Assessment/Plan    PT Assessment    PT Problem  List         PT Treatment Interventions      PT Goals (Current goals can be found in the Care Plan section)       Frequency     Barriers to discharge        Co-evaluation               AM-PAC PT "6 Clicks" Daily Activity  Outcome Measure                  End of Session              Time:  -      Charges:         PT G Codes:        9:33 AM, 07/17/2017 Ocie Bob, MPT Physical Therapist with [facility] [phone] office [phone] mobile phone  Ocie Bob, MPT 07-17-17, 9:16 AM

## 2017-06-22 NOTE — Evaluation (Signed)
Physical Therapy Evaluation Patient Details Name: Ruth Davidson MRN: [id number] DOB: [birth year] Today's Date: 06/22/2017   History of Present Illness  Ruth Davidson is a 76 y.o. female with medical history significant of CHF, COPD, CAD presenting w/ presyncope, SHD, encephalopathy. History primarily from pt's daughter as pt is lethargic s/p ativan. Per the daughter, patient was at home resting when she developed sudden onset of dizziness and lethargy home. No loss of consciousness no reported head trauma. No reported hemiparesis or confusion. Symptoms persisted mildly over the course of the day. No reported chest pain, shortness of breath, abdominal pain, nausea vomiting or diarrhea. Tobacco abuse.    Clinical Impression  Pt admitted with above diagnosis. Pt currently with functional limitations due to the deficits listed below;  Generalized weakness, slightly unsteady gait, and fatigue.  Pt will benefit from skilled PT to increase their independence and safety with mobility to allow discharge to home with family to supervise..       Follow Up Recommendations No PT follow up;Supervision - Intermittent    Equipment Recommendations  None recommended by PT    Recommendations for Other Services       Precautions / Restrictions Precautions Precautions: Fall Precaution Comments: mild fall risk Restrictions Weight Bearing Restrictions: No      Mobility  Bed Mobility Overal bed mobility: Modified Independent                Transfers Overall transfer level: Modified independent Equipment used: None                Ambulation/Gait Ambulation/Gait assistance: Supervision Ambulation Distance (Feet): 100 Feet Assistive device: None Gait Pattern/deviations: Decreased stride length   Gait velocity interpretation: at or above normal speed for age/gender General Gait Details: Decreased arm swing, no loss balance, slightly unsteady  Stairs            Wheelchair  Mobility    Modified Rankin (Stroke Patients Only)       Balance Overall balance assessment: No apparent balance deficits (not formally assessed)                                           Pertinent Vitals/Pain Pain Assessment: No/denies pain    Home Living Family/patient expects to be discharged to:: Private residence Living Arrangements: Children Available Help at Discharge: Family Type of Home: Mobile home Home Access: Stairs to enter Entrance Stairs-Rails: None Entrance Stairs-Number of Steps: 3 Home Layout: One level Home Equipment: None      Prior Function Level of Independence: Independent               Hand Dominance        Extremity/Trunk Assessment   Upper Extremity Assessment Upper Extremity Assessment: Overall WFL for tasks assessed    Lower Extremity Assessment Lower Extremity Assessment: Overall WFL for tasks assessed    Cervical / Trunk Assessment Cervical / Trunk Assessment: Normal  Communication   Communication: No difficulties  Cognition Arousal/Alertness: Awake/alert Behavior During Therapy: WFL for tasks assessed/performed Overall Cognitive Status: Within Functional Limits for tasks assessed                                        General Comments      Exercises     Assessment/Plan  PT Assessment Patient needs continued PT services  PT Problem List Decreased strength;Decreased mobility;Decreased activity tolerance       PT Treatment Interventions Gait training;Therapeutic activities;Therapeutic exercise    PT Goals (Current goals can be found in the Care Plan section)  Acute Rehab PT Goals Patient Stated Goal: Return home at prior level of function PT Goal Formulation: With patient/family Time For Goal Achievement: 06/25/17 Potential to Achieve Goals: Good    Frequency Min 2X/week   Barriers to discharge        Co-evaluation               AM-PAC PT "6 Clicks" Daily  Activity  Outcome Measure Difficulty turning over in bed (including adjusting bedclothes, sheets and blankets)?: None Difficulty moving from lying on back to sitting on the side of the bed? : None Difficulty sitting down on and standing up from a chair with arms (e.g., wheelchair, bedside commode, etc,.)?: None Help needed moving to and from a bed to chair (including a wheelchair)?: A Little Help needed walking in hospital room?: A Little Help needed climbing 3-5 steps with a railing? : A Little 6 Click Score: 21    End of Session Equipment Utilized During Treatment: Gait belt Activity Tolerance: Patient limited by fatigue Patient left: in bed;with family/visitor present;with call bell/phone within reach Nurse Communication: Mobility status PT Visit Diagnosis: Unsteadiness on feet (R26.81);Other abnormalities of gait and mobility (R26.89);Muscle weakness (generalized) (M62.81)    Time: 8119-1478 PT Time Calculation (min) (ACUTE ONLY): 37 min   Charges:   PT Evaluation $PT Eval Low Complexity: 1 Low PT Treatments $Gait Training: 8-22 mins   PT G Codes:   PT G-Codes **NOT FOR INPATIENT CLASS** Functional Assessment Tool Used: AM-PAC 6 Clicks Basic Mobility Functional Limitation: Mobility: Walking and moving around Mobility: Walking and Moving Around Current Status (G9562): At least 20 percent but less than 40 percent impaired, limited or restricted Mobility: Walking and Moving Around Goal Status [phone]): At least 20 percent but less than 40 percent impaired, limited or restricted Mobility: Walking and Moving Around Discharge Status [phone]): At least 20 percent but less than 40 percent impaired, limited or restricted    9:49 AM, 06/22/17 Ocie Bob, MPT Physical Therapist with [facility] [phone] office [phone] mobile phone

## 2017-06-22 NOTE — Progress Notes (Signed)
IV removed, WNL. D/C instructions given to pt. Verbalized understanding. Pt family at bedside to transport home. 

## 2017-06-22 NOTE — Progress Notes (Signed)
*  PRELIMINARY RESULTS* Echocardiogram 2D Echocardiogram has been performed.  Stacey Drain 06/22/2017, 1:37 PM

## 2017-06-22 NOTE — Discharge Summary (Signed)
Physician Discharge Summary  Ruth Davidson:[id number] DOB: [birth year] DOA: 06/21/2017  PCP: Patient, No Pcp Per  Admit date: 06/21/2017 Discharge date: 06/22/2017  Admitted From: Home Disposition:  Home  Recommendations for Outpatient Follow-up:  1. Follow up with PCP in 1-2 weeks 2. Please obtain BMP/CBC in one week   Home Health: Equipment/Devices:  Discharge Condition: Stable CODE STATUS:FULL Diet recommendation: Heart Healthy    Brief/Interim Summary: 76 year old female with a history of systolic and diastolic CHF, coronary artery disease, coronary artery disease with history of MI in 2013, cognitive impairment, medical noncompliance presented with acute onset of dizziness and confusion. The patient is a poor historian. History is obtained from the patient's sister and daughter. Apparently, the patient was getting up out of a seated position and had a sudden onset of dizziness and near syncope. Daughter stated that the patient was a bit more confused than usual. The patient did not lose consciousness. There is been no recent injury or trauma or falls according to the patient's daughter. However, the patient's daughter is usually at work during the daytime while the patient is alone. The patient herself denies any fevers, chills, chest pain, first breath, headache, neck pain, nausea, vomiting, diarrhea, abdominal pain. She continues to smoke 1 pack per day. Workup in the emergency department showed an unremarkable BMP and CBC except for some mild thrombocytopenia 135,000.  MRI of the brain showed small bilateral subdural hematomas without shift. Neurosurgery was contacted, and they did not feel the patient needed any intervention. They recommended a repeat CT of the brain on the morning of 06/22/2017. Repeat CT of the brain on 06/22/2017 showed stable SDH.  PT recommended no follow up  Discharge Diagnoses:  Subdural hematoma -Apparently nontraumatic without history of falling or  injury -However, patient is alone at home during the daytime while the patient's daughter is at work -06/22/2017 CT brain--stable SDH -PT evaluation -Hold aspirin for a week--plan to restart 06/28/2017  Orthostatic hypotension -Initial orthostatic vital signs were positive in the emergency department -The patient received approximately 2 L of fluid -I personally performed Repeat orthostatics 06/22/2017 were negative -Standing--95/60 HR 81;  sitting--101/48 HR 67;  Lying--102/45 HR67  Chronic systolic and diastolic CHF -10/09/2016 echo EF 10-15%, diffuse HK, grade 2 DD -06/22/17 Echo EF 15-20%, diffuse HK, G1DD -Appears euvolemic clinically -I have consulted the palliative medicine after speaking with the patient's daughter per cardiology recommendations in the past -Restart carvedilol and furosemide -Per patient's daughter, she has been getting refills of her medications at urgent care  Delirium/altered mental status -Secondary to volume depletion and subdural hematoma -Per the patient's family at bedside the patient is now back to baseline -Urinalysis negative for pyuria  Cognitive impairment -The patient is alert and oriented 2 at baseline  Tobacco abuse/COPD -Patient has no desire to quit  Hyperlipidemia -Continue statin  Goals of care -Palliative medicine was consulted--> conversation was started regarding goals of care--> remains full scope care -Will continuing conversations on subsequent admissions  Discharge Instructions   Allergies as of 06/22/2017   No Known Allergies     Medication List    STOP taking these medications   cephALEXin 250 MG capsule Commonly known as:  KEFLEX   levofloxacin 500 MG tablet Commonly known as:  LEVAQUIN     TAKE these medications   ALKA-SELTZER PLUS COLD & COUGH PO Take by mouth daily as needed (for cough).   aspirin 81 MG chewable tablet Chew 1 tablet (81 mg total) by mouth  daily. Restart on 06/28/17 Start taking on:   06/28/2017 What changed:  additional instructions  These instructions start on 06/28/2017. If you are unsure what to do until then, ask your doctor or other care provider.   carvedilol 3.125 MG tablet Commonly known as:  COREG Take 1 tablet (3.125 mg total) by mouth 2 (two) times daily with a meal.   furosemide 40 MG tablet Commonly known as:  LASIX Take 1 tablet (40 mg total) by mouth daily.   KLOR-CON M20 20 MEQ tablet Generic drug:  potassium chloride SA TAKE ONE TABLET BY MOUTH ONCE DAILY   nitroGLYCERIN 0.4 MG SL tablet Commonly known as:  NITROSTAT Place 1 tablet (0.4 mg total) under the tongue every 5 (five) minutes as needed for chest pain (or back pain or arm pain).   simvastatin 80 MG tablet Commonly known as:  ZOCOR Take 1 tablet (80 mg total) by mouth at bedtime.   SUDAFED SINUS PO Take 1 tablet by mouth daily as needed (for cough).       No Known Allergies  Consultations:  Palliative medicine   Procedures/Studies: Ct Head Wo Contrast  Result Date: 06/22/2017 CLINICAL DATA:  Presyncope.  Sudden onset of dizziness and lethargy. EXAM: CT HEAD WITHOUT CONTRAST TECHNIQUE: Contiguous axial images were obtained from the base of the skull through the vertex without intravenous contrast. COMPARISON:  MRI brain 06/21/2017. FINDINGS: Brain: BILATERAL subdural fluid collections are seen over the convexity, unchanged from yesterday's MRI. These fluid collections are hypoattenuating, but of increased density relative to CSF consistent with chronic subdural hematomas. There is slight flattening of cortical gyri, but no midline shift. No acute stroke, acute hemorrhage, intra-axial mass lesion, or hydrocephalus. Vascular: Calcification of the cavernous internal carotid arteries consistent with cerebrovascular atherosclerotic disease. No signs of intracranial large vessel occlusion. Skull: Normal. Negative for fracture or focal lesion. Sinuses/Orbits: No acute finding. Other:  None. IMPRESSION: Stable BILATERAL extra-axial collections consistent with chronic subdural hematomas. Electronically Signed   By: Elsie Stain M.D.   On: 06/22/2017 10:50   Mr Brain Wo Contrast  Addendum Date: 06/21/2017   ADDENDUM REPORT: 06/21/2017 13:47 ADDENDUM: Study discussed by telephone with Dr. Loren Racer on 06/21/2017 at 1340 hours. Electronically Signed   By: Odessa Fleming M.D.   On: 06/21/2017 13:47   Result Date: 06/21/2017 CLINICAL DATA:  76 year old female with dizziness since 1030 hours. Nausea and vomiting. EXAM: MRI HEAD WITHOUT CONTRAST TECHNIQUE: Multiplanar, multiecho pulse sequences of the brain and surrounding structures were obtained without intravenous contrast. COMPARISON:  Head CT without contrast 07/21/2005. FINDINGS: Brain: Small bilateral subdural hematomas, measuring about 4 mm over both cerebral convexities. Series 10 image 30 and series 13 images 14 and 15. No midline shift and basilar cisterns remain patent. No intraventricular or other intracranial hemorrhage identified. No restricted diffusion to suggest acute infarction. No ventriculomegaly. Cervicomedullary junction and pituitary are within normal limits. Mild for age scattered nonspecific cerebral white matter T2 and FLAIR hyperintensity, more so in the left frontal lobe. No cortical encephalomalacia or parenchymal blood products identified. Deep gray matter nuclei, brainstem, and cerebellum appear normal. Vascular: Major intracranial vascular flow voids are preserved. Skull and upper cervical spine: Negative. Sinuses/Orbits: Negative. Other: Mild left mastoid effusion is new since 2006. Negative nasopharynx. Right mastoids are clear. Negative scalp soft tissues. IMPRESSION: 1. Positive for small bilateral subdural hematomas, 4-5 mm each. No midline shift or loss of basilar cisterns. 2. Otherwise largely unremarkable for age noncontrast MRI appearance of the brain.  3. Mild left mastoid effusion, likely postinflammatory.  Electronically Signed: By: Odessa Fleming M.D. On: 06/21/2017 13:35   Dg Chest Port 1 View  Result Date: 06/21/2017 CLINICAL DATA:  Dizziness. EXAM: PORTABLE CHEST 1 VIEW COMPARISON:  May 19, 2017 FINDINGS: The left-sided consolidation seen previously has resolved. No pneumothorax. The cardiomediastinal silhouette is normal. No pulmonary nodules, masses, focal infiltrates, or overt edema. IMPRESSION: No active disease. Electronically Signed   By: Gerome Sam III M.D   On: 06/21/2017 17:26        Discharge Exam: Vitals:   06/21/17 2110 06/22/17 0550  BP:  (!) 100/49  Pulse:  (!) 58  Resp: 20 20  Temp: 98.7 F (37.1 C) 98.6 F (37 C)  SpO2: 95% 92%   Vitals:   06/21/17 1900 06/21/17 2030 06/21/17 2110 06/22/17 0550  BP: (!) 102/57 (!) 103/54  (!) 100/49  Pulse: 79 (!) 59  (!) 58  Resp: 15 20 20 20   Temp:   98.7 F (37.1 C) 98.6 F (37 C)  TempSrc:    Oral  SpO2: 100% 99% 95% 92%  Weight:   40.8 kg (89 lb 15.2 oz)   Height:   5\' 6"  (1.676 m)     General: Pt is alert, awake, not in acute distress Cardiovascular: RRR, S1/S2 +, no rubs, no gallops Respiratory: diminished breath sounds at bases. CTA bilaterally, no wheezing, no rhonchi Abdominal: Soft, NT, ND, bowel sounds + Extremities: no edema, no cyanosis   The results of significant diagnostics from this hospitalization (including imaging, microbiology, ancillary and laboratory) are listed below for reference.    Significant Diagnostic Studies: Ct Head Wo Contrast  Result Date: 06/22/2017 CLINICAL DATA:  Presyncope.  Sudden onset of dizziness and lethargy. EXAM: CT HEAD WITHOUT CONTRAST TECHNIQUE: Contiguous axial images were obtained from the base of the skull through the vertex without intravenous contrast. COMPARISON:  MRI brain 06/21/2017. FINDINGS: Brain: BILATERAL subdural fluid collections are seen over the convexity, unchanged from yesterday's MRI. These fluid collections are hypoattenuating, but of increased  density relative to CSF consistent with chronic subdural hematomas. There is slight flattening of cortical gyri, but no midline shift. No acute stroke, acute hemorrhage, intra-axial mass lesion, or hydrocephalus. Vascular: Calcification of the cavernous internal carotid arteries consistent with cerebrovascular atherosclerotic disease. No signs of intracranial large vessel occlusion. Skull: Normal. Negative for fracture or focal lesion. Sinuses/Orbits: No acute finding. Other: None. IMPRESSION: Stable BILATERAL extra-axial collections consistent with chronic subdural hematomas. Electronically Signed   By: Elsie Stain M.D.   On: 06/22/2017 10:50   Mr Brain Wo Contrast  Addendum Date: 06/21/2017   ADDENDUM REPORT: 06/21/2017 13:47 ADDENDUM: Study discussed by telephone with Dr. Loren Racer on 06/21/2017 at 1340 hours. Electronically Signed   By: Odessa Fleming M.D.   On: 06/21/2017 13:47   Result Date: 06/21/2017 CLINICAL DATA:  76 year old female with dizziness since 1030 hours. Nausea and vomiting. EXAM: MRI HEAD WITHOUT CONTRAST TECHNIQUE: Multiplanar, multiecho pulse sequences of the brain and surrounding structures were obtained without intravenous contrast. COMPARISON:  Head CT without contrast 07/21/2005. FINDINGS: Brain: Small bilateral subdural hematomas, measuring about 4 mm over both cerebral convexities. Series 10 image 30 and series 13 images 14 and 15. No midline shift and basilar cisterns remain patent. No intraventricular or other intracranial hemorrhage identified. No restricted diffusion to suggest acute infarction. No ventriculomegaly. Cervicomedullary junction and pituitary are within normal limits. Mild for age scattered nonspecific cerebral white matter T2 and FLAIR hyperintensity, more  so in the left frontal lobe. No cortical encephalomalacia or parenchymal blood products identified. Deep gray matter nuclei, brainstem, and cerebellum appear normal. Vascular: Major intracranial vascular flow  voids are preserved. Skull and upper cervical spine: Negative. Sinuses/Orbits: Negative. Other: Mild left mastoid effusion is new since 2006. Negative nasopharynx. Right mastoids are clear. Negative scalp soft tissues. IMPRESSION: 1. Positive for small bilateral subdural hematomas, 4-5 mm each. No midline shift or loss of basilar cisterns. 2. Otherwise largely unremarkable for age noncontrast MRI appearance of the brain. 3. Mild left mastoid effusion, likely postinflammatory. Electronically Signed: By: Odessa Fleming M.D. On: 06/21/2017 13:35   Dg Chest Port 1 View  Result Date: 06/21/2017 CLINICAL DATA:  Dizziness. EXAM: PORTABLE CHEST 1 VIEW COMPARISON:  May 19, 2017 FINDINGS: The left-sided consolidation seen previously has resolved. No pneumothorax. The cardiomediastinal silhouette is normal. No pulmonary nodules, masses, focal infiltrates, or overt edema. IMPRESSION: No active disease. Electronically Signed   By: Gerome Sam III M.D   On: 06/21/2017 17:26     Microbiology: Recent Results (from the past 240 hour(s))  Culture, blood (Routine X 2) w Reflex to ID Panel     Status: None (Preliminary result)   Collection Time: 06/21/17  5:03 PM  Result Value Ref Range Status   Specimen Description RIGHT ANTECUBITAL  Final   Special Requests   Final    BOTTLES DRAWN AEROBIC AND ANAEROBIC Blood Culture adequate volume   Culture NO GROWTH < 24 HOURS  Final   Report Status PENDING  Incomplete  Culture, blood (Routine X 2) w Reflex to ID Panel     Status: None (Preliminary result)   Collection Time: 06/21/17  5:09 PM  Result Value Ref Range Status   Specimen Description BLOOD RIGHT WRIST  Final   Special Requests   Final    BOTTLES DRAWN AEROBIC AND ANAEROBIC Blood Culture adequate volume   Culture NO GROWTH < 24 HOURS  Final   Report Status PENDING  Incomplete     Labs: Basic Metabolic Panel:  Recent Labs Lab 06/21/17 1220 06/22/17 0532  NA 141 144  K 3.4* 3.8  CL 105 115*  CO2 26 25    GLUCOSE 121* 74  BUN 10 9  CREATININE 1.00 0.87  CALCIUM 8.8* 8.3*   Liver Function Tests:  Recent Labs Lab 06/21/17 1231 06/22/17 0532  AST 18 15  ALT 10* 10*  ALKPHOS 78 68  BILITOT 0.5 0.6  PROT 5.8* 4.9*  ALBUMIN 3.3* 2.7*   No results for input(s): LIPASE, AMYLASE in the last 168 hours. No results for input(s): AMMONIA in the last 168 hours. CBC:  Recent Labs Lab 06/21/17 1220 06/22/17 0532  WBC 5.8 6.0  HGB 13.8 12.8  HCT 41.6 39.2  MCV 97.4 99.2  PLT 116* 135*   Cardiac Enzymes:  Recent Labs Lab 06/21/17 1231  TROPONINI <0.03   BNP: Invalid input(s): POCBNP CBG: No results for input(s): GLUCAP in the last 168 hours.  Time coordinating discharge:  Greater than 30 minutes  Signed:  Helena Sardo, DO [facility] Pager: [phone] 06/22/2017, 2:59 PM

## 2017-06-22 NOTE — Consult Note (Signed)
Consultation Note Date: 06/22/2017   Patient Name: Ruth Davidson  DOB: 09/23/1941  MRN: [id number]  Age / Sex: 76 y.o., female  PCP: Patient, No Pcp Per Referring Physician: Catarina Hartshorn, MD  Reason for Consultation: Establishing goals of care and Psychosocial/spiritual support  HPI/Patient Profile: 76 y.o. female  with past medical history of COPD, still smoking approximately 1/2 pack per day, CHF last EF of 20%, mid-stage dementia, history of coronary artery disease with MI,  admitted on 06/21/2017 with pre-syncopal episode, subdural hematoma, that his stable.   Clinical Assessment and Goals of Care: Ruth Davidson is resting quietly in bed. She greets me making and keeping contact. Present Davidson at bedside is Davidson Ruth Davidson. Ruth Davidson lives in [place] home. Ruth Davidson also has 2 sons. As we're having a family meeting, one son Ruth Davidson arrives. We talk about Ruth Davidson's nutritional status. Family Davidson that she's always been small, but does not eat much at this point. Ruth Davidson Davidson that she was 99 pounds earlier this year, but has had several illnesses including pneumonia, and has lost weight.  We talk about the normal changes that occur with COPD including weight loss, osteoporosis. We also talk about heart failure and coronary artery disease.  We talk about how to make choices for Ruth Davidson. I encourage them to think about if we are doing "something for her or something to her". Also encourage them to keep her at the center of decision-making. Can we change what's happening, will this test or treatment change her chronic heart failure, or chronic COPD. We talk about functional decline. I share a diagram of the chronic illness pathway, what is normal and expected. We talk about life in a nursing home, Ruth Davidson that he would never allow his mother to live in a nursing home. I share that things look  different as we change with age. I share that I'm available anytime they have questions about what's next.   Healthcare power of attorney NEXT OF KIN - there is no legal document naming anyone person as healthcare power of attorney. Ruth Davidson,  Davidson is main caregiver, but she has 2 brothers. Ruth Davidson. I share that the law Davidson that without healthcare power of attorney, reasonably available adult children make choices together.   SUMMARY OF RECOMMENDATIONS   at this point, continue to treat the treatable. Continue code status discussions.  Code Status/Advance Care Planning:  Full code - we talk about the realities of CPR and intubation. As I'm discussing chest compressions, son Ruth Davidson is nodding his head yes. After I finished discussing my concerns about chest compressions and intubation for Ruth Davidson, Davidson Ruth Davidson that she would not want that for her mother. I encourage Ruth Davidson to think about what is right for Ruth Davidson. I share that she is very frail, can he imagine [place] pushing on her chest.  Symptom Management:   per hospitalist, no additional needs at this time.  Palliative Prophylaxis:   Bowel Regimen  Additional Recommendations (Limitations, Scope,  Preferences):  Full Scope Treatment  Psycho-social/Spiritual:   Desire for further Chaplaincy support:no  Additional Recommendations: Caregiving  Support/Resources and Education on Hospice  Prognosis:   < 12 months, would not be surprising based on frailty, CHF, COPD with continued tobacco use, poor nutritional status.  Discharge Planning: Home, no need for home health at this time.      Primary Diagnoses: Present on Admission: . Pre-syncope . Tobacco abuse . Emphysema of lung (HCC) . Dementia . Cardiac cachexia   I have reviewed the medical record, interviewed the patient and family, and examined the patient. The following aspects are pertinent.  Past Medical History:  Diagnosis Date   . Cardiac cachexia   . CHF (congestive heart failure) (HCC)   . COPD (chronic obstructive pulmonary disease) (HCC)   . Coronary artery disease   . Dementia   . MI (myocardial infarction) (HCC)   . Non-compliance   . Thyroid disease   . Tobacco use    Social History   Social History  . Marital status: Widowed    Spouse name: N/A  . Number of children: N/A  . Years of education: N/A   Social History Main Topics  . Smoking status: Current Every Day Smoker    Packs/day: 0.25    Years: 50.00    Types: Cigarettes    Start date: 11/02/1958  . Smokeless tobacco: Never Used  . Alcohol use No  . Drug use: No  . Sexual activity: Not Asked   Other Topics Concern  . None   Social History Narrative  . None   Family History  Problem Relation Age of Onset  . Hypertension Father        Died in war   Scheduled Meds: . atorvastatin  40 mg Oral q1800  . potassium chloride SA  20 mEq Oral Daily   Continuous Infusions: PRN Meds:.acetaminophen, nitroGLYCERIN, ondansetron (ZOFRAN) IV Medications Prior to Admission:  Prior to Admission medications   Medication Sig Start Date End Date Taking? Authorizing Provider  carvedilol (COREG) 3.125 MG tablet Take 1 tablet (3.125 mg total) by mouth 2 (two) times daily with a meal. 09/04/16  Yes Laqueta Linden, MD  KLOR-CON M20 20 MEQ tablet TAKE ONE TABLET BY MOUTH ONCE DAILY 04/05/17  Yes Laqueta Linden, MD  nitroGLYCERIN (NITROSTAT) 0.4 MG SL tablet Place 1 tablet (0.4 mg total) under the tongue every 5 (five) minutes as needed for chest pain (or back pain or arm pain). 11/10/15  Yes Dione Booze, MD  Phenyleph-CPM-DM-Aspirin (ALKA-SELTZER PLUS COLD & COUGH PO) Take by mouth daily as needed (for cough).   Yes [provider]  Pseudoephedrine-Acetaminophen (SUDAFED SINUS PO) Take 1 tablet by mouth daily as needed (for cough).   Yes [provider]  simvastatin (ZOCOR) 80 MG tablet Take 1 tablet (80 mg total) by mouth at  bedtime. 09/22/16  Yes Laqueta Linden, MD  aspirin 81 MG chewable tablet Chew 1 tablet (81 mg total) by mouth daily. Restart on 06/28/17 06/28/17   Ruth Hartshorn, MD  cephALEXin (KEFLEX) 250 MG capsule Take 1 capsule (250 mg total) by mouth 4 (four) times daily. Patient not taking: Reported on 06/21/2017 05/10/17   Donnetta Hutching, MD  furosemide (LASIX) 40 MG tablet Take 1 tablet (40 mg total) by mouth daily. 09/22/16 05/19/17  Laqueta Linden, MD  levofloxacin (LEVAQUIN) 500 MG tablet Take 1 tablet (500 mg total) by mouth daily. Patient not taking: Reported on 06/21/2017 05/20/17   Zadie Rhine,  MD   No Known Allergies Review of Systems  Unable to perform ROS: Dementia    Physical Exam  Constitutional: No distress.  HENT:  Head: Atraumatic.  Cardiovascular: Normal rate.   Pulmonary/Chest: Effort normal. No respiratory distress.  Neurological: She is alert.  Nursing note and vitals reviewed.   Vital Signs: BP (!) 113/47 (BP Location: Left Arm)   Pulse 71   Temp 97.6 F (36.4 C) (Oral)   Resp 19   Ht 5\' 6"  (1.676 m)   Wt 40.8 kg (89 lb 15.2 oz)   SpO2 100%   BMI 14.52 kg/m  Pain Assessment: 0-10   Pain Score: Asleep   SpO2: SpO2: 100 % O2 Device:SpO2: 100 % O2 Flow Rate: .   IO: Intake/output summary:  Intake/Output Summary (Last 24 hours) at 06/22/17 1619 Last data filed at 06/22/17 1500  Gross per 24 hour  Intake          1068.75 ml  Output                0 ml  Net          1068.75 ml    LBM: Last BM Date: 06/20/17 Baseline Weight: Weight: 40.8 kg (90 lb) Most recent weight: Weight: 40.8 kg (89 lb 15.2 oz)     Palliative Assessment/Data:   Flowsheet Rows     Most Recent Value  Intake Tab  Referral Department  Hospitalist  Unit at Time of Referral  Med/Surg Unit  Palliative Care Primary Diagnosis  Other (Comment)  Date Notified  06/22/17  Reason for referral  Clarify Goals of Care  Date of Admission  06/21/17  Date first seen by Palliative Care   06/22/17  # of days Palliative referral response time  0 Day(s)  # of days IP prior to Palliative referral  1  Clinical Assessment  Palliative Performance Scale Score  60%  Pain Max last 24 hours  Not able to report  Pain Min Last 24 hours  Not able to report  Dyspnea Max Last 24 Hours  Not able to report  Dyspnea Min Last 24 hours  Not able to report  Psychosocial & Spiritual Assessment  Palliative Care Outcomes  Patient/Family meeting held?  Yes  Who was at the meeting?  patient, Davidson Ruth Davidson and son Ruth Davidson  Palliative Care Outcomes  Provided advance care planning, Provided psychosocial or spiritual support, Clarified goals of care      Time In: 1140 Time Out: 1230 Time Total: 50 minutes Greater than 50%  of this time was spent counseling and coordinating care related to the above assessment and plan.  Signed by: Katheran Awe, NP   Please contact Palliative Medicine Team phone at [phone] for questions and concerns.  For individual provider: See Loretha Stapler

## 2017-06-22 NOTE — Care Management Obs Status (Signed)
MEDICARE OBSERVATION STATUS NOTIFICATION   Patient Details  Name: Ruth Davidson MRN: [id number] Date of Birth: 1941/10/21   Medicare Observation Status Notification Given:  Yes    Marlie Kuennen, Chrystine Oiler, RN 06/22/2017, 11:27 AM

## 2017-06-26 LAB — CULTURE, BLOOD (ROUTINE X 2)
Culture: NO GROWTH
Culture: NO GROWTH
Special Requests: ADEQUATE
Special Requests: ADEQUATE

## 2017-07-15 NOTE — Progress Notes (Deleted)
    Patient ID: Berneta SagesJudy M Lebo, female    DOB: Oct 25, 1941, 76 y.o.   MRN: [id number]  No chief complaint on file.   Allergies Patient has no known allergies.  Subjective:   Berneta SagesJudy M Argabright is a 76 y.o. female who [facility].  HPI HPI  Past Medical History:  Diagnosis Date  . Cardiac cachexia   . CHF (congestive heart failure) (HCC)   . COPD (chronic obstructive pulmonary disease) (HCC)   . Coronary artery disease   . Dementia   . MI (myocardial infarction) (HCC)   . Non-compliance   . Thyroid disease   . Tobacco use     Past Surgical History:  Procedure Laterality Date  . CHOLECYSTECTOMY    . CORONARY ANGIOPLASTY WITH STENT PLACEMENT  2003  . THYROIDECTOMY      Family History  Problem Relation Age of Onset  . Hypertension Father        Died in war     Social History   Social History  . Marital status: Widowed    Spouse name: N/A  . Number of children: N/A  . Years of education: N/A   Social History Main Topics  . Smoking status: Current Every Day Smoker    Packs/day: 0.25    Years: 50.00    Types: Cigarettes    Start date: 11/02/1958  . Smokeless tobacco: Never Used  . Alcohol use No  . Drug use: No  . Sexual activity: Not on file   Other Topics Concern  . Not on file   Social History Narrative  . No narrative on file    Review of Systems   Objective:   There were no vitals taken for this visit.  Physical Exam   Assessment and Plan   There are no diagnoses linked to this encounter.   No Follow-up on file. Judd GaudierShannon M Aragorn Recker, CMA 07/15/2017

## 2017-07-16 ENCOUNTER — Ambulatory Visit: Payer: Medicare Other | Admitting: Family Medicine

## 2017-08-20 ENCOUNTER — Other Ambulatory Visit (HOSPITAL_COMMUNITY)
Admission: RE | Admit: 2017-08-20 | Discharge: 2017-08-20 | Disposition: A | Discharge status: Home / Self Care | Payer: Medicare Other | Source: Ambulatory Visit | Attending: Family Medicine | Admitting: Family Medicine

## 2017-08-20 ENCOUNTER — Encounter: Payer: Self-pay | Admitting: Family Medicine

## 2017-08-20 ENCOUNTER — Ambulatory Visit: Payer: Medicare Other | Admitting: Family Medicine

## 2017-08-20 ENCOUNTER — Ambulatory Visit (INDEPENDENT_AMBULATORY_CARE_PROVIDER_SITE_OTHER): Payer: Medicare Other | Admitting: Family Medicine

## 2017-08-20 VITALS — BP 106/70 | HR 68 | Temp 98.8°F | Resp 16 | Ht 66.0 in | Wt 85.8 lb

## 2017-08-20 DIAGNOSIS — R3121 Asymptomatic microscopic hematuria: Secondary | ICD-10-CM | POA: Diagnosis not present

## 2017-08-20 DIAGNOSIS — E782 Mixed hyperlipidemia: Secondary | ICD-10-CM

## 2017-08-20 DIAGNOSIS — Z9889 Other specified postprocedural states: Secondary | ICD-10-CM | POA: Insufficient documentation

## 2017-08-20 DIAGNOSIS — R413 Other amnesia: Secondary | ICD-10-CM

## 2017-08-20 DIAGNOSIS — I951 Orthostatic hypotension: Secondary | ICD-10-CM | POA: Diagnosis not present

## 2017-08-20 DIAGNOSIS — Z9861 Coronary angioplasty status: Secondary | ICD-10-CM | POA: Diagnosis not present

## 2017-08-20 DIAGNOSIS — Z9009 Acquired absence of other part of head and neck: Secondary | ICD-10-CM

## 2017-08-20 DIAGNOSIS — Z79899 Other long term (current) drug therapy: Secondary | ICD-10-CM | POA: Diagnosis not present

## 2017-08-20 DIAGNOSIS — Z23 Encounter for immunization: Secondary | ICD-10-CM

## 2017-08-20 DIAGNOSIS — I2510 Atherosclerotic heart disease of native coronary artery without angina pectoris: Secondary | ICD-10-CM | POA: Diagnosis not present

## 2017-08-20 DIAGNOSIS — S065X9A Traumatic subdural hemorrhage with loss of consciousness of unspecified duration, initial encounter: Secondary | ICD-10-CM

## 2017-08-20 DIAGNOSIS — E890 Postprocedural hypothyroidism: Secondary | ICD-10-CM

## 2017-08-20 DIAGNOSIS — S065XAA Traumatic subdural hemorrhage with loss of consciousness status unknown, initial encounter: Secondary | ICD-10-CM

## 2017-08-20 LAB — URINALYSIS, ROUTINE W REFLEX MICROSCOPIC
BILIRUBIN URINE: NEGATIVE
Glucose, UA: NEGATIVE mg/dL
KETONES UR: NEGATIVE mg/dL
Leukocytes, UA: NEGATIVE
Nitrite: NEGATIVE
Protein, ur: NEGATIVE mg/dL
SPECIFIC GRAVITY, URINE: 1.008 (ref 1.005–1.030)
SQUAMOUS EPITHELIAL / LPF: NONE SEEN
pH: 6 (ref 5.0–8.0)

## 2017-08-20 NOTE — Progress Notes (Signed)
Patient ID: Ruth Davidson, female    DOB: [birth year], 76 y.o.   MRN: [id number]  Chief Complaint  Patient presents with  . Establish Care    Allergies Patient has no known allergies.  Subjective:   Ruth Davidson is a 76 y.o. female who presents to [facility] today.  HPI Here to establish care. Is brought in by daughter, Elnita Maxwell, who she lives with for the past year after her husband died. Comes in today so that can establish care and have a PCP. Reports that everytime she gets sick that they have to go to the ED. Reports that patient does not like going to the doctor and really does not like going to the hospital.   Daugther reports that for the past several years that patient has had trouble with her memory. Patient stays at home by herself during the day and daughter comes home to check on her at lunch. Patient does not cook. Spends the day watching tv, folding clothes, and cleaning up a little around the house. Patient likes to sit outside and smoke. Has never wandered away. Does not drive. Has never been a big eater and has never weighed over 120 pounds in her life. Only eats about five or six different foods. Likes to go to the bank, grocery store, and out to eat. Daughter reports that patient has never been very sociable and does not like to interact with others outside of the family. Enjoys watching game shows.  Reports that when she was sick, she lost a lot of weight. Reports that she has gained some weight back.  Was seen in the ED for some blood in her head and kept for a night and then sent home. Has been doing about the same. Has never had a stroke or AMI per daughter report. She does smoke over 1 ppd for over fifty years. Reports that she has been seen at the cardiology office in the past.  Daughter reports that they do not want a bunch of tests for mom but want her to be happy and just well taken care of. Daugther reports that when the time comes she will quit her  job and stay at home with her mother, but she will not put her in a nursing home.  Patient has no complaints today. She reports that she is ready to leave and go get some breakfast.   Has had trouble with memory for . Does not cook, drive, do laundry. Watches tv. Likes to sit on porch. Likes to fold clothes, dust, sweep floor. Does the same thing everyday. Gets confused. Does not drive. In the evening seems to get more confused.       Past Medical History:  Diagnosis Date  . Cardiac cachexia   . CHF (congestive heart failure) (HCC)   . COPD (chronic obstructive pulmonary disease) (HCC)   . Coronary artery disease   . Dementia   . MI (myocardial infarction) (HCC)   . Non-compliance   . Thyroid disease   . Tobacco use     Past Surgical History:  Procedure Laterality Date  . CHOLECYSTECTOMY    . CORONARY ANGIOPLASTY WITH STENT PLACEMENT  2003  . THYROIDECTOMY     goiter/no cancer, per report    Family History  Problem Relation Age of Onset  . Hypertension Father        Died in war  . Diabetes Mother   . Breast cancer Mother   . Pneumonia  Sister   . Hypertension Son      Social History   Social History  . Marital status: Widowed    Spouse name: N/A  . Number of children: N/A  . Years of education: N/A   Social History Main Topics  . Smoking status: Current Every Day Smoker    Packs/day: 1.00    Years: 50.00    Types: Cigarettes    Start date: 11/02/1958  . Smokeless tobacco: Never Used  . Alcohol use No  . Drug use: No  . Sexual activity: No   Other Topics Concern  . None   Social History Narrative   Widowed. 3 children. Lives with daughter for one year. Retired. Used to work at [employer].     Review of Systems     Objective:   BP 106/70 (BP Location: Left Arm, Patient Position: Sitting, Cuff Size: Normal)   Pulse 68   Temp 98.8 F (37.1 C) (Other (Comment))   Resp 16   Ht 5\' 6"  (1.676 m)   Wt 85 lb 12 oz (38.9 kg)   SpO2 97%   BMI 13.84 kg/m    Physical Exam  Constitutional: Vital signs are normal. She does not have a sickly appearance. She does not appear ill. No distress.  Very thin female but not cachectic appearing. Pleasant during exam but easily irritated with multiple questions. Got frustrated with questioning and said was cold and just wanted to go home.   HENT:  Head: Normocephalic and atraumatic.  Nose: Nose normal.  Mouth/Throat: Oropharynx is clear and moist.  Eyes: Pupils are equal, round, and reactive to light. EOM are normal. Scleral icterus is present.  Neck: Normal range of motion. Neck supple. No JVD present. No tracheal deviation present. No thyromegaly present.  Cardiovascular: Normal rate and regular rhythm.   Pulmonary/Chest: Effort normal and breath sounds normal.  Abdominal: Soft. Bowel sounds are normal. She exhibits no distension. There is no tenderness.  Musculoskeletal: Normal range of motion. She exhibits no edema.  Lymphadenopathy:    She has no cervical adenopathy.  Neurological: She is alert. She has normal strength and normal reflexes. She displays no tremor. No cranial nerve deficit or sensory deficit. She exhibits normal muscle tone. Coordination and gait normal.  Skin: Skin is warm and dry. She is not diaphoretic. No pallor.  Psychiatric: Her speech is normal and behavior is normal. Thought content normal. Her affect is labile. Thought content is not paranoid and not delusional. She expresses no homicidal and no suicidal ideation. She expresses no suicidal plans and no homicidal plans. She exhibits abnormal recent memory. She exhibits normal remote memory.  Patient oriented to person. She is not oriented to time. Place: somewhere you go to get help Year: 2020 Day of week: incorrect/Monday Time: don't know Does not know president Cannot repeat phrase after three attempts. Unable to problem solve.      Assessment and Plan   1. Mixed hyperlipidemia Check labs. Will continue at this time,  but consider stopping medication.  - Lipid panel  2. Subdural hematoma (HCC) Fall risk discussed with daughter. Defer CT repeat at this time b/c stable. Request records.  - CBC with Differential/Platelet - Protime-INR Stop aspirin.  3. Orthostatic hypotension History of this and with lower BP today in office, decrease BP dose . Check in one month.   4. CAD S/P remote PCI Review cardiology notes.  - Hemoglobin A1c  5. Hx of thyroidectomy Check labs.  - TSH  6. High  risk medication use  - Hepatic function panel  7. Asymptomatic microscopic hematuria Reviewed in chart from ED notes.  - Basic metabolic panel - Urinalysis, Routine w reflex microscopic  8. 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 Check these basic labs. Family defers any work up at this time. Severe/moderate, but able to take care of some of ADLs. Counseled daughter on risks of patient smoking and risk of fire. She reports that she understands this and has thought of this in the past.  Family will check into having a sitter with patient during the day.  Will check into options.  - Vitamin B12 - CBC with Differential/Platelet    Return in about 4 weeks (around 09/17/2017). Aliene Beamsachel Marrisa Kimber, MD 08/22/2017

## 2017-08-21 LAB — BASIC METABOLIC PANEL
BUN/Creatinine Ratio: 13 (calc) (ref 6–22)
BUN: 14 mg/dL (ref 7–25)
CALCIUM: 9.6 mg/dL (ref 8.6–10.4)
CO2: 30 mmol/L (ref 20–32)
Chloride: 100 mmol/L (ref 98–110)
Creat: 1.1 mg/dL — ABNORMAL HIGH (ref 0.60–0.93)
Glucose, Bld: 89 mg/dL (ref 65–99)
POTASSIUM: 3.2 mmol/L — AB (ref 3.5–5.3)
SODIUM: 146 mmol/L (ref 135–146)

## 2017-08-21 LAB — HEPATIC FUNCTION PANEL
AG Ratio: 2 (calc) (ref 1.0–2.5)
ALKALINE PHOSPHATASE (APISO): 91 U/L (ref 33–130)
ALT: 9 U/L (ref 6–29)
AST: 19 U/L (ref 10–35)
Albumin: 4.6 g/dL (ref 3.6–5.1)
BILIRUBIN DIRECT: 0.2 mg/dL (ref 0.0–0.2)
BILIRUBIN INDIRECT: 0.5 mg/dL (ref 0.2–1.2)
BILIRUBIN TOTAL: 0.7 mg/dL (ref 0.2–1.2)
Globulin: 2.3 g/dL (calc) (ref 1.9–3.7)
Total Protein: 6.9 g/dL (ref 6.1–8.1)

## 2017-08-21 LAB — PROTIME-INR
INR: 1
PROTHROMBIN TIME: 10.4 s (ref 9.0–11.5)

## 2017-08-21 LAB — TSH: TSH: 0.66 mIU/L (ref 0.40–4.50)

## 2017-08-21 LAB — CBC WITH DIFFERENTIAL/PLATELET
BASOS ABS: 32 {cells}/uL (ref 0–200)
Basophils Relative: 0.5 %
EOS PCT: 1 %
Eosinophils Absolute: 63 cells/uL (ref 15–500)
HEMATOCRIT: 46.2 % — AB (ref 35.0–45.0)
Hemoglobin: 15.5 g/dL (ref 11.7–15.5)
LYMPHS ABS: 1896 {cells}/uL (ref 850–3900)
MCH: 32 pg (ref 27.0–33.0)
MCHC: 33.5 g/dL (ref 32.0–36.0)
MCV: 95.5 fL (ref 80.0–100.0)
MPV: 11.9 fL (ref 7.5–12.5)
Monocytes Relative: 7 %
NEUTROS PCT: 61.4 %
Neutro Abs: 3868 cells/uL (ref 1500–7800)
Platelets: 168 10*3/uL (ref 140–400)
RBC: 4.84 10*6/uL (ref 3.80–5.10)
RDW: 12.7 % (ref 11.0–15.0)
Total Lymphocyte: 30.1 %
WBC mixed population: 441 cells/uL (ref 200–950)
WBC: 6.3 10*3/uL (ref 3.8–10.8)

## 2017-08-21 LAB — HEMOGLOBIN A1C
HEMOGLOBIN A1C: 5.1 %{Hb} (ref <5.7)
MEAN PLASMA GLUCOSE: 100 (calc)
eAG (mmol/L): 5.5 (calc)

## 2017-08-21 LAB — LIPID PANEL
Cholesterol: 180 mg/dL (ref <200)
HDL: 80 mg/dL (ref >50)
LDL Cholesterol (Calc): 79 mg/dL (calc)
NON-HDL CHOLESTEROL (CALC): 100 mg/dL (ref <130)
Total CHOL/HDL Ratio: 2.3 (calc) (ref <5.0)
Triglycerides: 112 mg/dL (ref <150)

## 2017-08-21 LAB — VITAMIN B12: VITAMIN B 12: 365 pg/mL (ref 200–1100)

## 2017-08-23 DIAGNOSIS — R413 Other amnesia: Secondary | ICD-10-CM | POA: Diagnosis not present

## 2017-08-23 DIAGNOSIS — R3121 Asymptomatic microscopic hematuria: Secondary | ICD-10-CM | POA: Diagnosis not present

## 2017-08-23 DIAGNOSIS — S065X9A Traumatic subdural hemorrhage with loss of consciousness of unspecified duration, initial encounter: Secondary | ICD-10-CM | POA: Diagnosis not present

## 2017-08-23 DIAGNOSIS — E782 Mixed hyperlipidemia: Secondary | ICD-10-CM | POA: Diagnosis not present

## 2017-08-23 LAB — URINALYSIS, ROUTINE W REFLEX MICROSCOPIC
BILIRUBIN URINE: NEGATIVE
Bacteria, UA: NONE SEEN /HPF
Glucose, UA: NEGATIVE
HYALINE CAST: NONE SEEN /LPF
Nitrite: NEGATIVE
PROTEIN: NEGATIVE
Specific Gravity, Urine: 1.022 (ref 1.001–1.03)
pH: 5.5 (ref 5.0–8.0)

## 2017-08-24 ENCOUNTER — Telehealth: Payer: Self-pay | Admitting: Family Medicine

## 2017-08-24 NOTE — Telephone Encounter (Signed)
Please call patient's daughter, Elnita MaxwellFronda, and advise her that her mother has a lot of blood in her urine and this needs to be evaluated. She has had several urine tests done which all reveal blood in her urine. This is not a normal finding, and it is worrisome in someone of her age and with a long smoking history. She needs to see the urologist. I have placed a referral. Please take her to the appointment.   Advise her that her mother's liver tests, cholesterol, blood counts, thyroid, b12, INR, and test for diabetes were all normal.   Advise that her kidney test were a bit elevated and her potassium was a bit low. Does she think that her mother would eat any potassium rich foods? I know that she only eats limited foods. Bananas, avocados, fruits? I can send a list. Otherwise, I need her to take  Micro K 10 meq, 1 po qd #14 and have her come back for a recheck in 2 weeks of  Potassium  You can place the order if that is what she would rather do.  Just let me know. Thanks.

## 2017-08-25 NOTE — Telephone Encounter (Signed)
Either way, she needs to return to lab in 2 weeks for a recheck. Janine Limboachel H. Tracie HarrierHagler, MD

## 2017-08-25 NOTE — Telephone Encounter (Signed)
Left message with daughter to return call.

## 2017-08-25 NOTE — Telephone Encounter (Signed)
Daughter informed

## 2017-08-25 NOTE — Telephone Encounter (Signed)
Daughter informed. They will try to get her to eat potasium rich foods first.

## 2017-09-29 ENCOUNTER — Other Ambulatory Visit: Payer: Self-pay | Admitting: Cardiovascular Disease

## 2017-10-08 ENCOUNTER — Ambulatory Visit: Payer: Medicare Other | Admitting: Family Medicine

## 2017-11-07 ENCOUNTER — Other Ambulatory Visit: Payer: Self-pay | Admitting: Cardiovascular Disease

## 2018-04-07 ENCOUNTER — Encounter: Payer: Self-pay | Admitting: Family Medicine

## 2018-04-08 ENCOUNTER — Encounter: Payer: Self-pay | Admitting: Family Medicine

## 2018-05-11 ENCOUNTER — Other Ambulatory Visit: Payer: Self-pay | Admitting: Cardiovascular Disease

## 2018-07-19 ENCOUNTER — Other Ambulatory Visit: Payer: Self-pay | Admitting: Cardiovascular Disease

## 2018-08-17 IMAGING — CT CT CHEST W/O CM
2 of 6 series · 14 of 36 positions shown, 18 images · non-contrast
Comparison: Chest radiograph from earlier today. 04/14/2016 chest
CT angiogram.

CLINICAL DATA: Cough and shortness of breath. Generalized weakness.

EXAM:
CT CHEST WITHOUT CONTRAST
TECHNIQUE: Multidetector CT imaging of the chest was performed following the
standard protocol without IV contrast.

[Series 2: thorax · axial · 0.59mm/px · z∈[-383,-107]mm · 13 of 154 slices shown, 17 images]
[im 8/154  mediastinal]
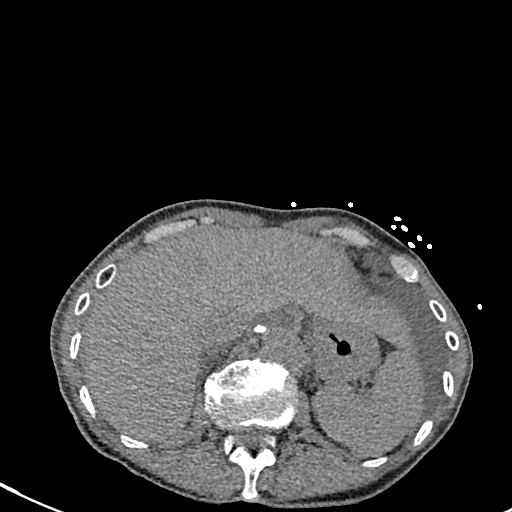
[im 8/154  lung]
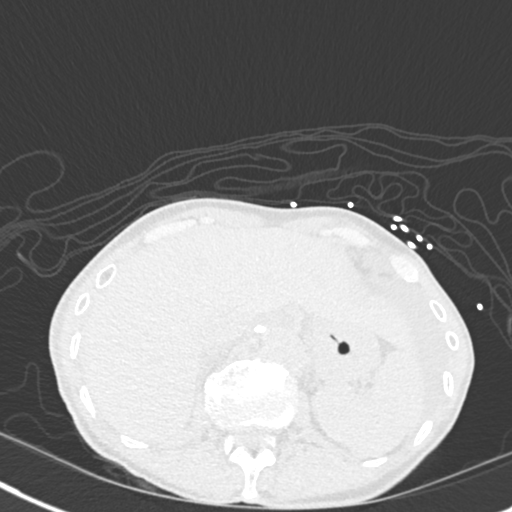
[im 22/154  lung]
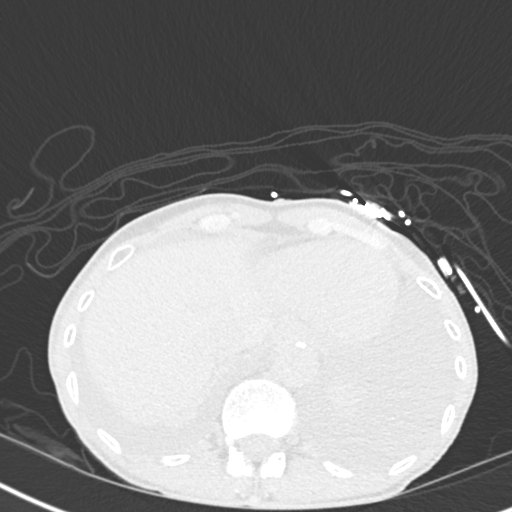
[im 30/154  lung]
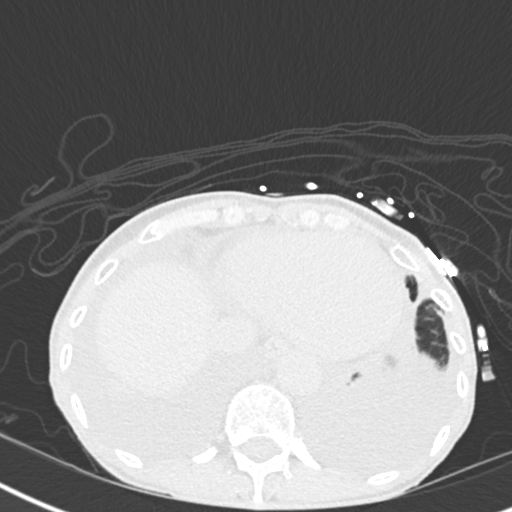
[im 44/154  lung]
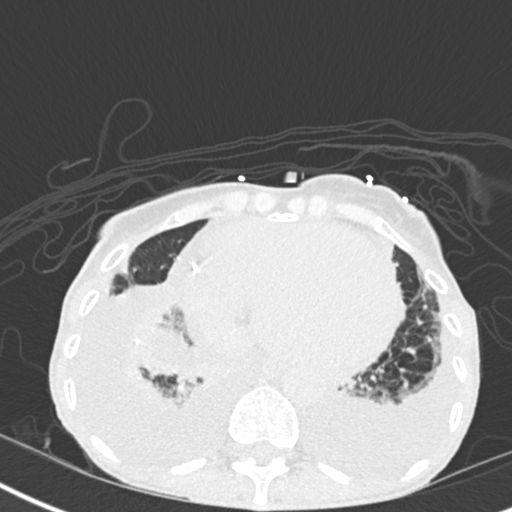
[im 52/154  mediastinal]
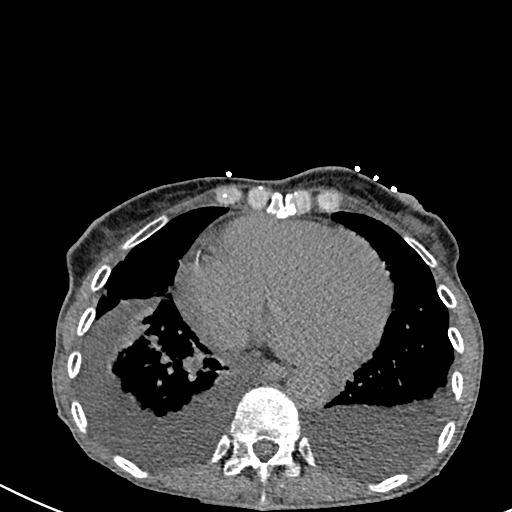
[im 52/154  lung]
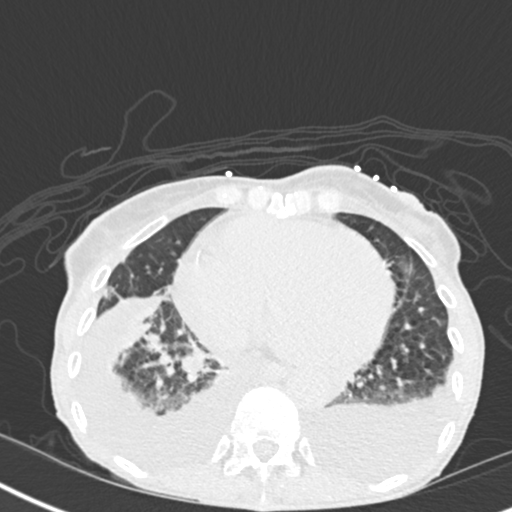
[im 66/154  lung]
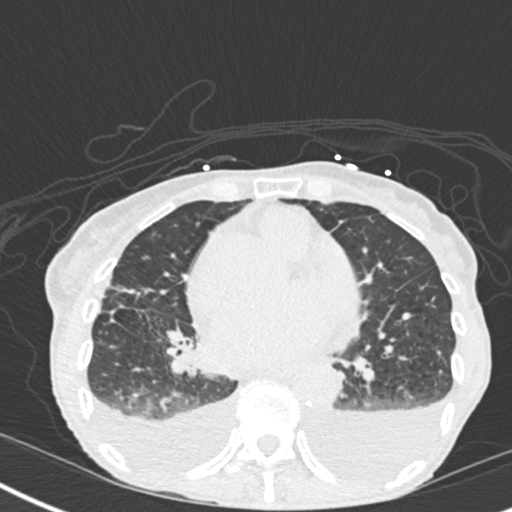
[im 81/154  lung]
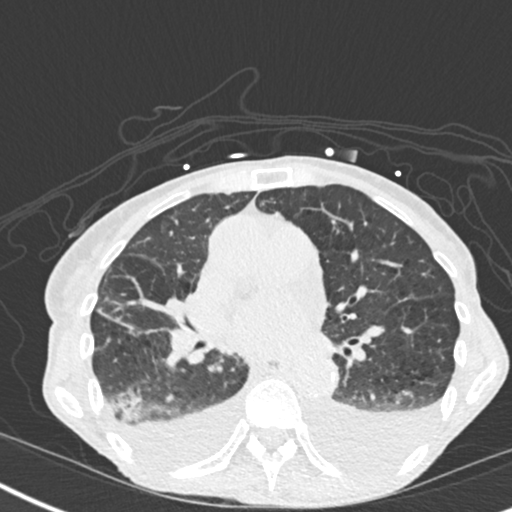
[im 88/154  lung]
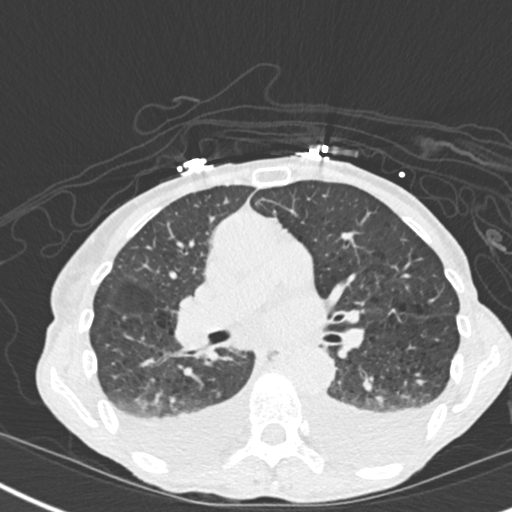
[im 103/154  mediastinal]
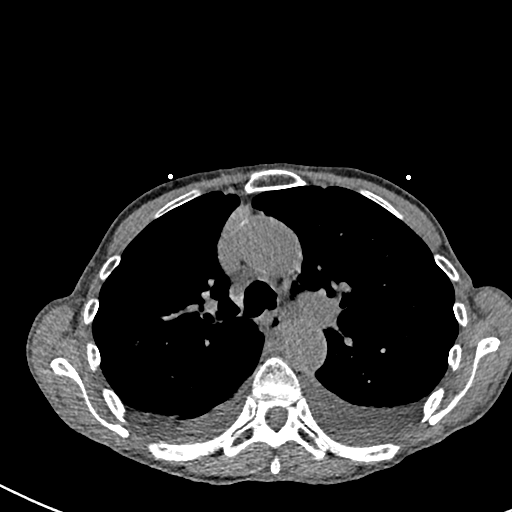
[im 103/154  lung]
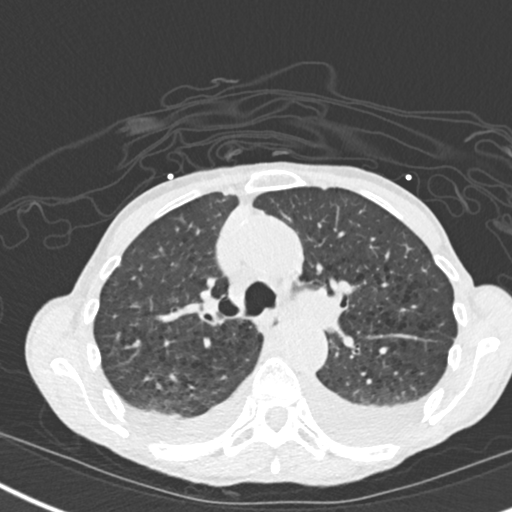
[im 110/154  lung]
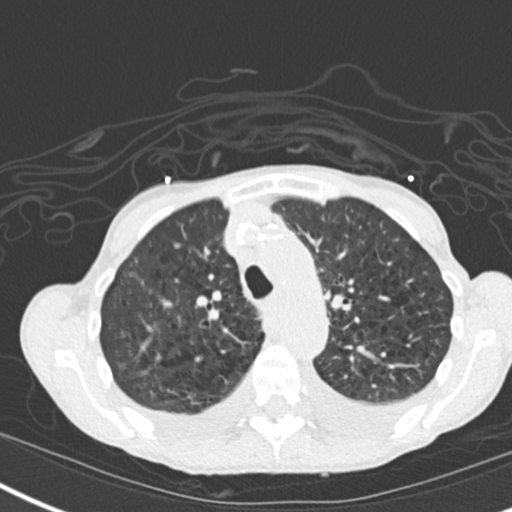
[im 124/154  lung]
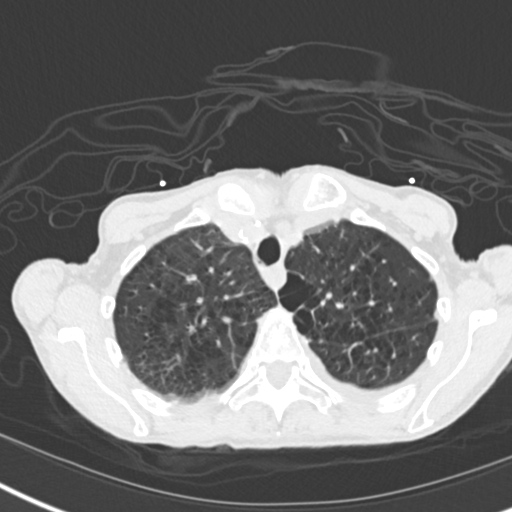
[im 132/154  lung]
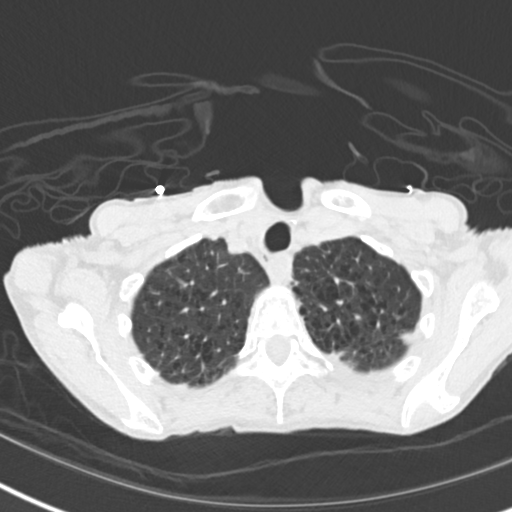
[im 146/154  mediastinal]
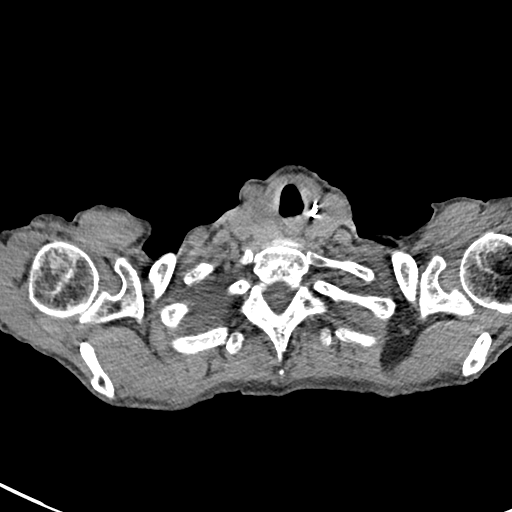
[im 146/154  lung]
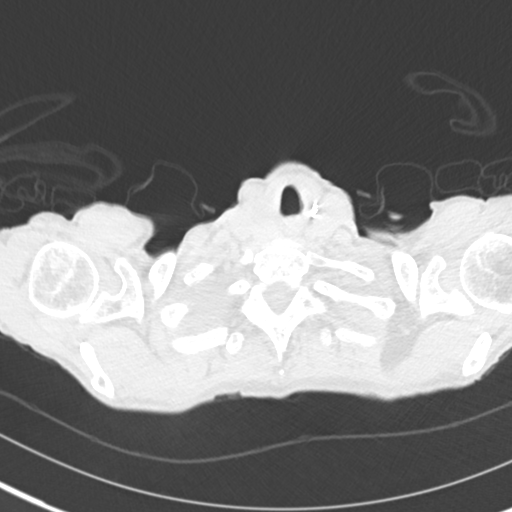

[Series 7: coronal · coronal · 0.60mm/px · 1 of 123 slices shown]
[im 62/123  lung]
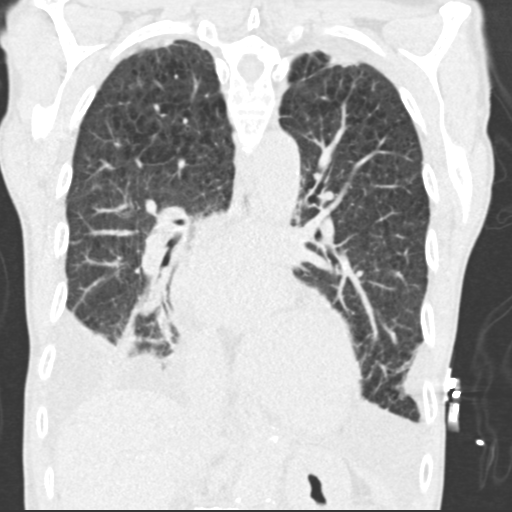

[14 of 36 positions shown; findings below may reference images not displayed]

FINDINGS: Motion degraded scan.

Cardiovascular: Mild cardiomegaly, stable. Trace pericardial
effusion/ thickening, stable. Right coronary atherosclerosis.
Atherosclerotic nonaneurysmal thoracic aorta. Normal caliber
pulmonary arteries.

Mediastinum/Nodes: Probable left hemithyroidectomy. Mild right-sided
multinodular goiter with dominant 1.5 cm hypodense anterior right
thyroid lobe nodule, stable. Unremarkable esophagus. No axillary
adenopathy. No pathologically enlarged mediastinal or gross hilar
nodes on this noncontrast study.

Lungs/Pleura: No pneumothorax. Small to moderate layering bilateral
pleural effusions. Moderate centrilobular emphysema and mild diffuse
bronchial wall thickening. Mild-to-moderate compressive atelectasis
in the dependent lower lobes bilaterally. New mild patchy
consolidation in the peripheral right lower lobe. Mild interlobular
septal thickening throughout both lungs. No lung masses or
significant pulmonary nodules in the aerated portions of the lungs.

Upper abdomen: Cholecystectomy. Simple 2.1 cm renal cyst in the
medial upper left kidney.

Musculoskeletal: No aggressive appearing focal osseous lesions.
Mild-to-moderate thoracic spondylosis.
IMPRESSION: 1. Stable mild cardiomegaly. Mild interlobular septal thickening
throughout both lungs suggesting mild pulmonary edema due to mild
congestive heart failure.
2. Small to moderate layering bilateral pleural effusions with
associated compressive bilateral lower lobe atelectasis.
3. New mild patchy consolidation in the peripheral right lower lobe,
cannot exclude superimposed mild infectious or aspiration pneumonia.
4. Aortic atherosclerosis.  One vessel coronary atherosclerosis.

## 2019-04-07 DIAGNOSIS — F0390 Unspecified dementia without behavioral disturbance: Secondary | ICD-10-CM | POA: Diagnosis not present

## 2019-04-07 DIAGNOSIS — E785 Hyperlipidemia, unspecified: Secondary | ICD-10-CM | POA: Diagnosis not present

## 2019-04-07 DIAGNOSIS — I2510 Atherosclerotic heart disease of native coronary artery without angina pectoris: Secondary | ICD-10-CM | POA: Diagnosis not present

## 2019-05-06 ENCOUNTER — Emergency Department (HOSPITAL_COMMUNITY): Payer: Medicare Other

## 2019-05-06 ENCOUNTER — Encounter (HOSPITAL_COMMUNITY): Payer: Self-pay | Admitting: Emergency Medicine

## 2019-05-06 ENCOUNTER — Emergency Department (HOSPITAL_COMMUNITY)
Admission: EM | Admit: 2019-05-06 | Discharge: 2019-05-07 | Disposition: A | Discharge status: Home / Self Care | Payer: Medicare Other | Attending: Emergency Medicine | Admitting: Emergency Medicine

## 2019-05-06 ENCOUNTER — Other Ambulatory Visit: Payer: Self-pay

## 2019-05-06 DIAGNOSIS — Z955 Presence of coronary angioplasty implant and graft: Secondary | ICD-10-CM | POA: Diagnosis not present

## 2019-05-06 DIAGNOSIS — I2510 Atherosclerotic heart disease of native coronary artery without angina pectoris: Secondary | ICD-10-CM | POA: Diagnosis not present

## 2019-05-06 DIAGNOSIS — J439 Emphysema, unspecified: Secondary | ICD-10-CM | POA: Diagnosis not present

## 2019-05-06 DIAGNOSIS — F17210 Nicotine dependence, cigarettes, uncomplicated: Secondary | ICD-10-CM | POA: Diagnosis not present

## 2019-05-06 DIAGNOSIS — Z03818 Encounter for observation for suspected exposure to other biological agents ruled out: Secondary | ICD-10-CM | POA: Insufficient documentation

## 2019-05-06 DIAGNOSIS — I252 Old myocardial infarction: Secondary | ICD-10-CM | POA: Diagnosis not present

## 2019-05-06 DIAGNOSIS — I5042 Chronic combined systolic (congestive) and diastolic (congestive) heart failure: Secondary | ICD-10-CM | POA: Insufficient documentation

## 2019-05-06 DIAGNOSIS — F0390 Unspecified dementia without behavioral disturbance: Secondary | ICD-10-CM | POA: Insufficient documentation

## 2019-05-06 DIAGNOSIS — R45851 Suicidal ideations: Secondary | ICD-10-CM | POA: Diagnosis not present

## 2019-05-06 DIAGNOSIS — R9431 Abnormal electrocardiogram [ECG] [EKG]: Secondary | ICD-10-CM | POA: Diagnosis not present

## 2019-05-06 DIAGNOSIS — R410 Disorientation, unspecified: Secondary | ICD-10-CM | POA: Diagnosis not present

## 2019-05-06 DIAGNOSIS — Z046 Encounter for general psychiatric examination, requested by authority: Secondary | ICD-10-CM | POA: Insufficient documentation

## 2019-05-06 DIAGNOSIS — R259 Unspecified abnormal involuntary movements: Secondary | ICD-10-CM | POA: Diagnosis not present

## 2019-05-06 DIAGNOSIS — J449 Chronic obstructive pulmonary disease, unspecified: Secondary | ICD-10-CM | POA: Diagnosis not present

## 2019-05-06 DIAGNOSIS — R4182 Altered mental status, unspecified: Secondary | ICD-10-CM | POA: Diagnosis not present

## 2019-05-06 LAB — CBC WITH DIFFERENTIAL/PLATELET
Abs Immature Granulocytes: 0.02 10*3/uL (ref 0.00–0.07)
Basophils Absolute: 0 10*3/uL (ref 0.0–0.1)
Basophils Relative: 1 %
Eosinophils Absolute: 0 10*3/uL (ref 0.0–0.5)
Eosinophils Relative: 0 %
HCT: 47.9 % — ABNORMAL HIGH (ref 36.0–46.0)
Hemoglobin: 15.2 g/dL — ABNORMAL HIGH (ref 12.0–15.0)
Immature Granulocytes: 0 %
Lymphocytes Relative: 16 %
Lymphs Abs: 1.3 10*3/uL (ref 0.7–4.0)
MCH: 30.7 pg (ref 26.0–34.0)
MCHC: 31.7 g/dL (ref 30.0–36.0)
MCV: 96.8 fL (ref 80.0–100.0)
Monocytes Absolute: 0.6 10*3/uL (ref 0.1–1.0)
Monocytes Relative: 7 %
Neutro Abs: 6.3 10*3/uL (ref 1.7–7.7)
Neutrophils Relative %: 76 %
Platelets: 211 10*3/uL (ref 150–400)
RBC: 4.95 MIL/uL (ref 3.87–5.11)
RDW: 12.8 % (ref 11.5–15.5)
WBC: 8.2 10*3/uL (ref 4.0–10.5)
nRBC: 0 % (ref 0.0–0.2)

## 2019-05-06 LAB — COMPREHENSIVE METABOLIC PANEL
ALT: 11 U/L (ref 0–44)
AST: 19 U/L (ref 15–41)
Albumin: 4.3 g/dL (ref 3.5–5.0)
Alkaline Phosphatase: 71 U/L (ref 38–126)
Anion gap: 14 (ref 5–15)
BUN: 16 mg/dL (ref 8–23)
CO2: 24 mmol/L (ref 22–32)
Calcium: 9.4 mg/dL (ref 8.9–10.3)
Chloride: 105 mmol/L (ref 98–111)
Creatinine, Ser: 1.16 mg/dL — ABNORMAL HIGH (ref 0.44–1.00)
GFR calc Af Amer: 53 mL/min — ABNORMAL LOW (ref >60)
GFR calc non Af Amer: 45 mL/min — ABNORMAL LOW (ref >60)
Glucose, Bld: 105 mg/dL — ABNORMAL HIGH (ref 70–99)
Potassium: 3.2 mmol/L — ABNORMAL LOW (ref 3.5–5.1)
Sodium: 143 mmol/L (ref 135–145)
Total Bilirubin: 0.7 mg/dL (ref 0.3–1.2)
Total Protein: 7 g/dL (ref 6.5–8.1)

## 2019-05-06 LAB — SARS CORONAVIRUS 2 BY RT PCR (HOSPITAL ORDER, PERFORMED IN ~~LOC~~ HOSPITAL LAB): SARS Coronavirus 2: NEGATIVE

## 2019-05-06 LAB — ETHANOL: Alcohol, Ethyl (B): <10 mg/dL (ref <10)

## 2019-05-06 LAB — SALICYLATE LEVEL: Salicylate Lvl: <7 mg/dL (ref 2.8–30.0)

## 2019-05-06 LAB — ACETAMINOPHEN LEVEL: Acetaminophen (Tylenol), Serum: <10 ug/mL — ABNORMAL LOW (ref 10–30)

## 2019-05-06 MED ORDER — ACETAMINOPHEN 325 MG PO TABS: 650.0000 mg | ORAL_TABLET | ORAL | Status: DC | PRN

## 2019-05-06 MED ORDER — NICOTINE 21 MG/24HR TD PT24: 21.0000 mg | MEDICATED_PATCH | Freq: Every day | TRANSDERMAL | Status: DC

## 2019-05-06 MED ORDER — ALUM & MAG HYDROXIDE-SIMETH 200-200-20 MG/5ML PO SUSP: 30.0000 mL | Freq: Four times a day (QID) | ORAL | Status: DC | PRN

## 2019-05-06 MED ORDER — ONDANSETRON HCL 4 MG PO TABS: 4.0000 mg | ORAL_TABLET | Freq: Three times a day (TID) | ORAL | Status: DC | PRN

## 2019-05-06 NOTE — ED Notes (Signed)
Pt ambulatory to restroom

## 2019-05-06 NOTE — ED Notes (Signed)
Pt lying in bed,TTS finished; pt in nad

## 2019-05-06 NOTE — ED Notes (Signed)
Patient was able to void in specimen cup to obtain specimen.
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
## 2019-05-06 NOTE — ED Notes (Signed)
Patient walked to restroom to obtain urine specimen. Patient not able to give enough urine to obtain specimen.
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
## 2019-05-07 DIAGNOSIS — F0390 Unspecified dementia without behavioral disturbance: Secondary | ICD-10-CM

## 2019-05-07 DIAGNOSIS — R410 Disorientation, unspecified: Secondary | ICD-10-CM

## 2019-05-07 LAB — URINALYSIS, ROUTINE W REFLEX MICROSCOPIC
Bacteria, UA: NONE SEEN
Bilirubin Urine: NEGATIVE
Glucose, UA: NEGATIVE mg/dL
Ketones, ur: 5 mg/dL — AB
Leukocytes,Ua: NEGATIVE
Nitrite: NEGATIVE
Protein, ur: NEGATIVE mg/dL
Specific Gravity, Urine: 1.018 (ref 1.005–1.030)
pH: 5 (ref 5.0–8.0)

## 2019-05-07 LAB — RAPID URINE DRUG SCREEN, HOSP PERFORMED
Amphetamines: NOT DETECTED
Barbiturates: NOT DETECTED
Benzodiazepines: NOT DETECTED
Cocaine: NOT DETECTED
Opiates: NOT DETECTED
Tetrahydrocannabinol: NOT DETECTED

## 2019-05-07 NOTE — ED Provider Notes (Signed)
Pt re-evaluated by Psych team this morning: pt does not meet inpt psych treatment criteria, IVC can be rescided and pt can be d/c back to home with family. [name] agreeable with plan. Will d/c stable.     Francine Graven, DO 05/07/19 [phone]
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
## 2019-05-07 NOTE — ED Notes (Signed)
Spoke with pts daughter Hurshel Keys about patients plan of care.

## 2019-05-07 NOTE — ED Notes (Signed)
Belongings given to pt and daughter at this time from psych locker and pt changing into her clothes. EDP working on d/c papers and resending her IVC papers.

## 2019-05-07 NOTE — ED Provider Notes (Addendum)
Patient left a change of shift to check her UA results.  She will be reevaluated by psychiatry in the a.m.   Results for orders placed or performed during the hospital encounter of 05/06/19  SARS Coronavirus 2 (CEPHEID - Performed in [place] hospital lab), [facility] Order  Urinalysis, Routine w reflex microscopic  Result Value Ref Range   Color, Urine YELLOW YELLOW   APPearance HAZY (A) CLEAR   Specific Gravity, Urine 1.018 1.005 - 1.030   pH 5.0 5.0 - 8.0   Glucose, UA NEGATIVE NEGATIVE mg/dL   Hgb urine dipstick LARGE (A) NEGATIVE   Bilirubin Urine NEGATIVE NEGATIVE   Ketones, ur 5 (A) NEGATIVE mg/dL   Protein, ur NEGATIVE NEGATIVE mg/dL   Nitrite NEGATIVE NEGATIVE   Leukocytes,Ua NEGATIVE NEGATIVE   RBC / HPF 11-20 0 - 5 RBC/hpf   WBC, UA 6-10 0 - 5 WBC/hpf   Bacteria, UA NONE SEEN NONE SEEN   Squamous Epithelial / LPF 11-20 0 - 5   Mucus PRESENT     Patient's urine has persistent hematuria which she has had on multiple prior samples, there are a few white blood cells without bacteria however it is a contaminated specimen.  Her nitrite and leukocytes are negative.  This is felt to be a normal urine.     Antibiotics were not started.  Patient can be evaluated by psychiatry in the morning.   Rolland Porter, MD 05/07/19 Katharina Caper    Rolland Porter, MD 05/07/19 Pryor Curia

## 2019-05-07 NOTE — ED Notes (Signed)
IVC rescinded paperwork sent to the magistrate.

## 2019-05-07 NOTE — ED Notes (Signed)
Pt.s breakfast tray has arrived. In RM at the moment. Stated they did not want to eat, left breakfast in RM. Will continue to monitor.

## 2019-05-07 NOTE — Discharge Instructions (Signed)
Take your usual prescriptions as previously directed.  Call your regular medical doctor tomorrow to schedule a follow up appointment within the week.  Return to the Emergency Department immediately sooner if worsening.

## 2019-05-08 IMAGING — DX DG CHEST 2V
2 series · 2 of 2 positions shown · non-contrast
Comparison: May 10, 2017

CLINICAL DATA: Shortness of Breath

EXAM:
CHEST  2 VIEW

[chest pa]
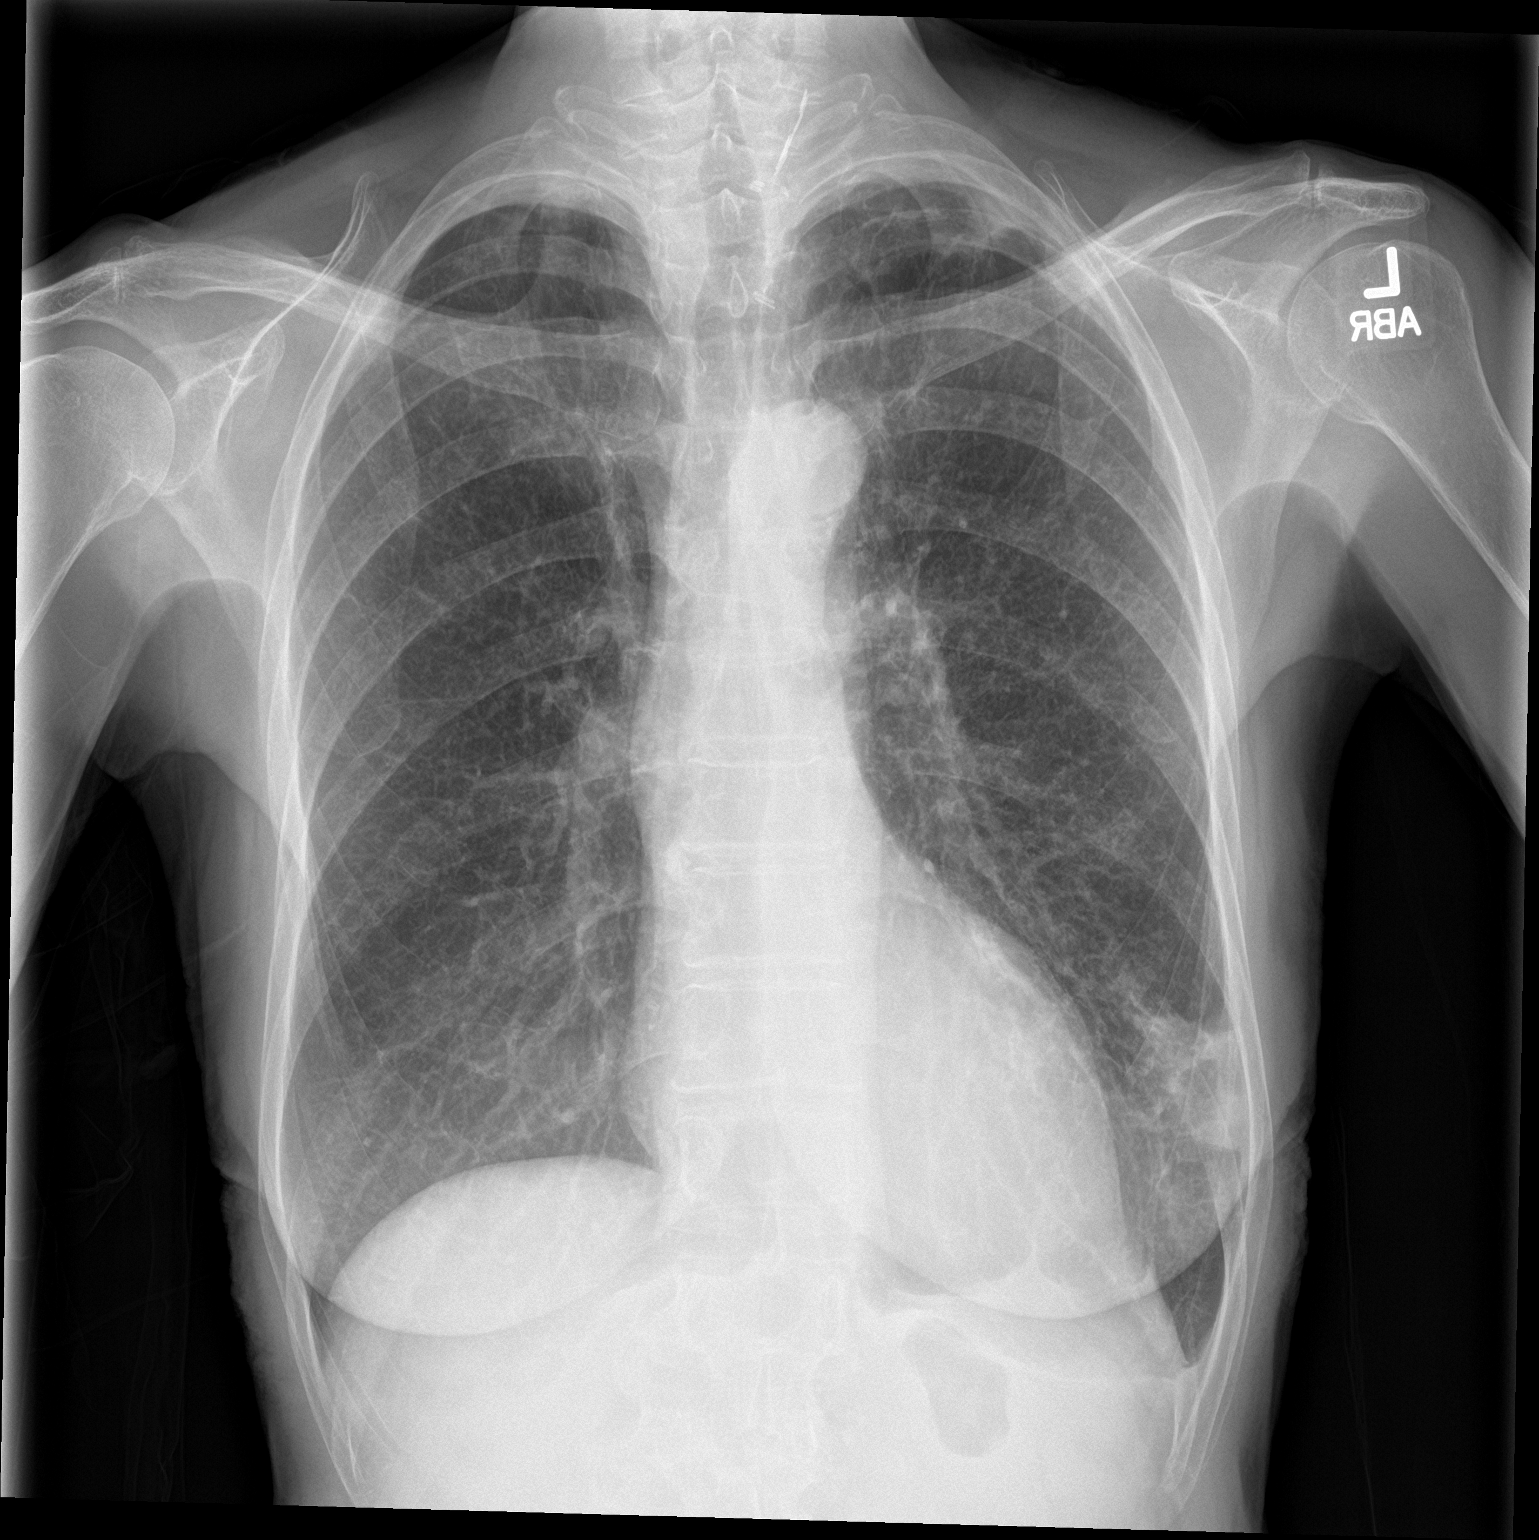

[chest lat]
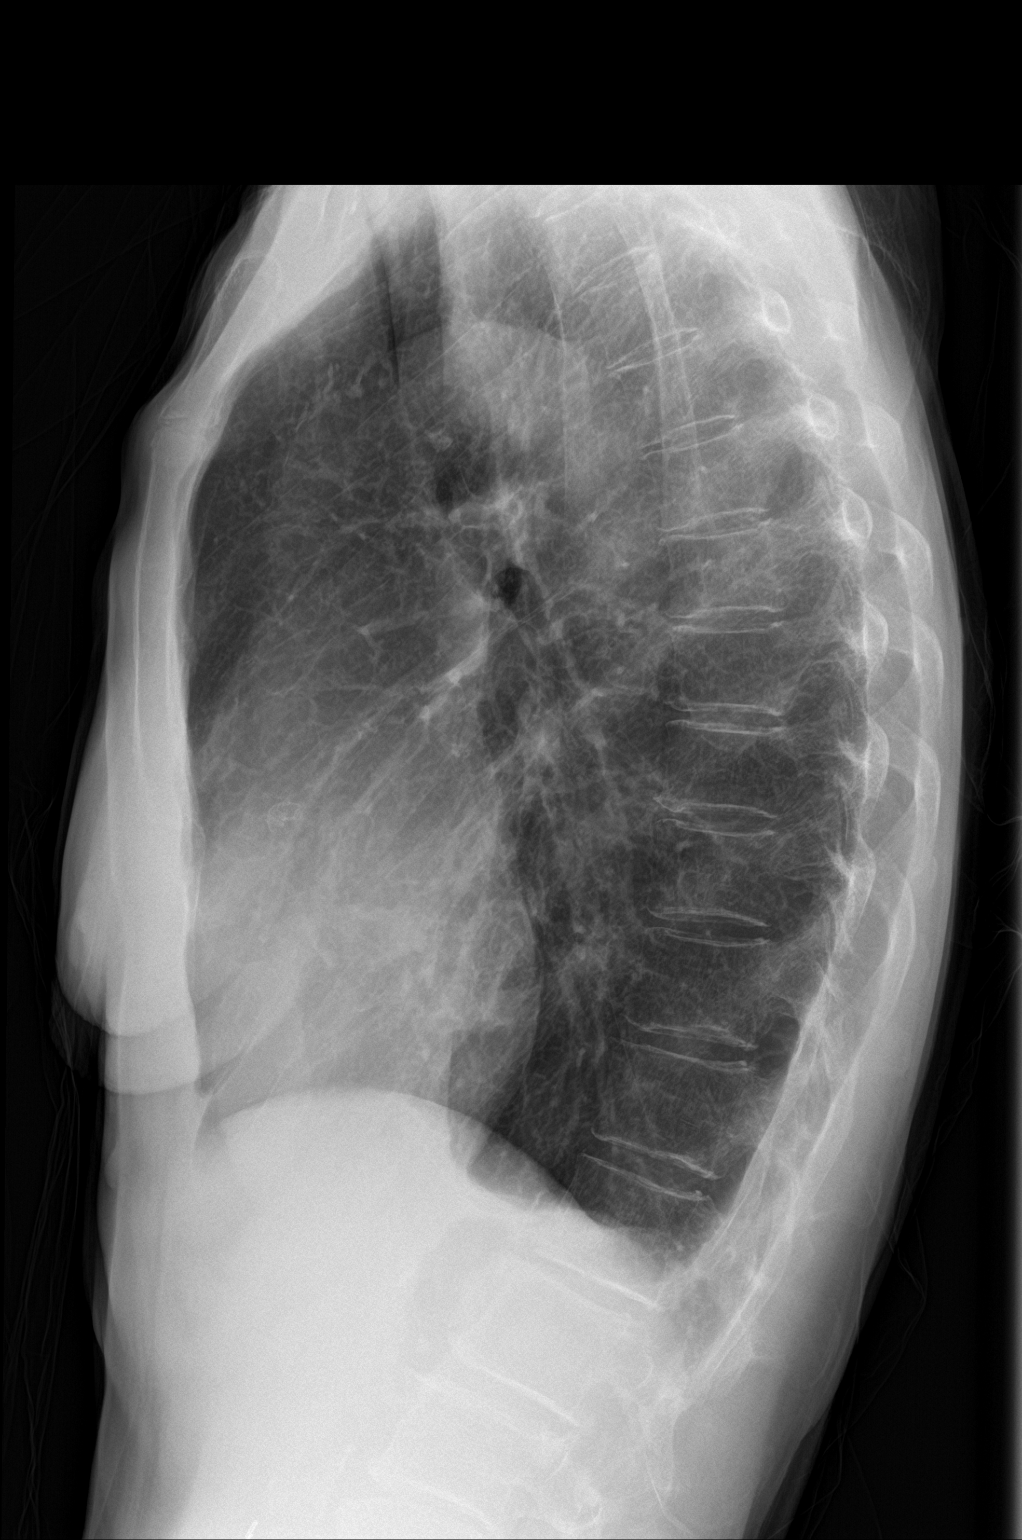

[2 of 2 positions shown; findings below may reference images not displayed]

FINDINGS: There is new airspace consolidation in the inferior lingula. There
is a minimal left pleural effusion. There is mild scarring in the
left apex, stable. The lungs elsewhere are clear. Heart size and
pulmonary vascularity are normal. No adenopathy. No evident bone
lesions.
IMPRESSION: Airspace consolidation consistent with pneumonia in the inferior
lingula. Minimal left pleural effusion. Stable scarring left apex.
Lungs elsewhere clear. Stable cardiac silhouette.

## 2019-05-16 ENCOUNTER — Inpatient Hospital Stay (HOSPITAL_COMMUNITY)
Admission: EM | Admit: 2019-05-16 | Discharge: 2019-05-30 | DRG: 640 | Disposition: A | Discharge status: Skilled Nursing Facility | Payer: Medicare Other | Attending: Family Medicine | Admitting: Family Medicine

## 2019-05-16 ENCOUNTER — Other Ambulatory Visit: Payer: Self-pay

## 2019-05-16 DIAGNOSIS — E860 Dehydration: Secondary | ICD-10-CM | POA: Diagnosis present

## 2019-05-16 DIAGNOSIS — E782 Mixed hyperlipidemia: Secondary | ICD-10-CM | POA: Diagnosis not present

## 2019-05-16 DIAGNOSIS — Z833 Family history of diabetes mellitus: Secondary | ICD-10-CM

## 2019-05-16 DIAGNOSIS — Z681 Body mass index (BMI) 19 or less, adult: Secondary | ICD-10-CM | POA: Diagnosis not present

## 2019-05-16 DIAGNOSIS — J439 Emphysema, unspecified: Secondary | ICD-10-CM | POA: Diagnosis not present

## 2019-05-16 DIAGNOSIS — I420 Dilated cardiomyopathy: Secondary | ICD-10-CM | POA: Diagnosis not present

## 2019-05-16 DIAGNOSIS — I252 Old myocardial infarction: Secondary | ICD-10-CM

## 2019-05-16 DIAGNOSIS — R9431 Abnormal electrocardiogram [ECG] [EKG]: Secondary | ICD-10-CM | POA: Diagnosis not present

## 2019-05-16 DIAGNOSIS — R404 Transient alteration of awareness: Secondary | ICD-10-CM | POA: Diagnosis not present

## 2019-05-16 DIAGNOSIS — Z743 Need for continuous supervision: Secondary | ICD-10-CM | POA: Diagnosis not present

## 2019-05-16 DIAGNOSIS — Z66 Do not resuscitate: Secondary | ICD-10-CM | POA: Diagnosis not present

## 2019-05-16 DIAGNOSIS — Z7982 Long term (current) use of aspirin: Secondary | ICD-10-CM | POA: Diagnosis not present

## 2019-05-16 DIAGNOSIS — Z8249 Family history of ischemic heart disease and other diseases of the circulatory system: Secondary | ICD-10-CM | POA: Diagnosis not present

## 2019-05-16 DIAGNOSIS — Z515 Encounter for palliative care: Secondary | ICD-10-CM

## 2019-05-16 DIAGNOSIS — E0590 Thyrotoxicosis, unspecified without thyrotoxic crisis or storm: Secondary | ICD-10-CM | POA: Diagnosis not present

## 2019-05-16 DIAGNOSIS — E876 Hypokalemia: Principal | ICD-10-CM | POA: Diagnosis present

## 2019-05-16 DIAGNOSIS — E039 Hypothyroidism, unspecified: Secondary | ICD-10-CM | POA: Diagnosis present

## 2019-05-16 DIAGNOSIS — Z716 Tobacco abuse counseling: Secondary | ICD-10-CM | POA: Diagnosis not present

## 2019-05-16 DIAGNOSIS — Z9049 Acquired absence of other specified parts of digestive tract: Secondary | ICD-10-CM

## 2019-05-16 DIAGNOSIS — Z803 Family history of malignant neoplasm of breast: Secondary | ICD-10-CM | POA: Diagnosis not present

## 2019-05-16 DIAGNOSIS — E785 Hyperlipidemia, unspecified: Secondary | ICD-10-CM | POA: Diagnosis present

## 2019-05-16 DIAGNOSIS — Z7189 Other specified counseling: Secondary | ICD-10-CM | POA: Diagnosis not present

## 2019-05-16 DIAGNOSIS — Z20828 Contact with and (suspected) exposure to other viral communicable diseases: Secondary | ICD-10-CM | POA: Diagnosis not present

## 2019-05-16 DIAGNOSIS — F17210 Nicotine dependence, cigarettes, uncomplicated: Secondary | ICD-10-CM | POA: Diagnosis present

## 2019-05-16 DIAGNOSIS — F0391 Unspecified dementia with behavioral disturbance: Secondary | ICD-10-CM | POA: Diagnosis not present

## 2019-05-16 DIAGNOSIS — R627 Adult failure to thrive: Secondary | ICD-10-CM | POA: Diagnosis not present

## 2019-05-16 DIAGNOSIS — R000 Tachycardia, unspecified: Secondary | ICD-10-CM | POA: Diagnosis not present

## 2019-05-16 DIAGNOSIS — Z79899 Other long term (current) drug therapy: Secondary | ICD-10-CM

## 2019-05-16 DIAGNOSIS — Z955 Presence of coronary angioplasty implant and graft: Secondary | ICD-10-CM

## 2019-05-16 DIAGNOSIS — I5042 Chronic combined systolic (congestive) and diastolic (congestive) heart failure: Secondary | ICD-10-CM | POA: Diagnosis present

## 2019-05-16 DIAGNOSIS — Z751 Person awaiting admission to adequate facility elsewhere: Secondary | ICD-10-CM | POA: Diagnosis not present

## 2019-05-16 DIAGNOSIS — F0390 Unspecified dementia without behavioral disturbance: Secondary | ICD-10-CM | POA: Diagnosis not present

## 2019-05-16 DIAGNOSIS — R410 Disorientation, unspecified: Secondary | ICD-10-CM | POA: Diagnosis not present

## 2019-05-16 DIAGNOSIS — Z9861 Coronary angioplasty status: Secondary | ICD-10-CM | POA: Diagnosis not present

## 2019-05-16 DIAGNOSIS — I2510 Atherosclerotic heart disease of native coronary artery without angina pectoris: Secondary | ICD-10-CM

## 2019-05-16 DIAGNOSIS — E43 Unspecified severe protein-calorie malnutrition: Secondary | ICD-10-CM | POA: Diagnosis not present

## 2019-05-16 DIAGNOSIS — R52 Pain, unspecified: Secondary | ICD-10-CM | POA: Diagnosis not present

## 2019-05-16 DIAGNOSIS — Z209 Contact with and (suspected) exposure to unspecified communicable disease: Secondary | ICD-10-CM | POA: Diagnosis not present

## 2019-05-16 DIAGNOSIS — Z720 Tobacco use: Secondary | ICD-10-CM | POA: Diagnosis not present

## 2019-05-16 DIAGNOSIS — Z03818 Encounter for observation for suspected exposure to other biological agents ruled out: Secondary | ICD-10-CM | POA: Diagnosis not present

## 2019-05-16 DIAGNOSIS — E440 Moderate protein-calorie malnutrition: Secondary | ICD-10-CM

## 2019-05-16 DIAGNOSIS — J438 Other emphysema: Secondary | ICD-10-CM | POA: Diagnosis not present

## 2019-05-16 LAB — COMPREHENSIVE METABOLIC PANEL
ALT: 9 U/L (ref 0–44)
AST: 15 U/L (ref 15–41)
Albumin: 3.4 g/dL — ABNORMAL LOW (ref 3.5–5.0)
Alkaline Phosphatase: 71 U/L (ref 38–126)
Anion gap: 10 (ref 5–15)
BUN: 9 mg/dL (ref 8–23)
CO2: 30 mmol/L (ref 22–32)
Calcium: 8.9 mg/dL (ref 8.9–10.3)
Chloride: 101 mmol/L (ref 98–111)
Creatinine, Ser: 0.93 mg/dL (ref 0.44–1.00)
GFR calc Af Amer: >60 mL/min (ref >60)
GFR calc non Af Amer: 59 mL/min — ABNORMAL LOW (ref >60)
Glucose, Bld: 118 mg/dL — ABNORMAL HIGH (ref 70–99)
Potassium: 2.4 mmol/L — CL (ref 3.5–5.1)
Sodium: 141 mmol/L (ref 135–145)
Total Bilirubin: 0.6 mg/dL (ref 0.3–1.2)
Total Protein: 6.6 g/dL (ref 6.5–8.1)

## 2019-05-16 LAB — CBC WITH DIFFERENTIAL/PLATELET
Abs Immature Granulocytes: 0.02 10*3/uL (ref 0.00–0.07)
Basophils Absolute: 0 10*3/uL (ref 0.0–0.1)
Basophils Relative: 1 %
Eosinophils Absolute: 0.1 10*3/uL (ref 0.0–0.5)
Eosinophils Relative: 1 %
HCT: 46.3 % — ABNORMAL HIGH (ref 36.0–46.0)
Hemoglobin: 14.8 g/dL (ref 12.0–15.0)
Immature Granulocytes: 0 %
Lymphocytes Relative: 20 %
Lymphs Abs: 1.7 10*3/uL (ref 0.7–4.0)
MCH: 30.6 pg (ref 26.0–34.0)
MCHC: 32 g/dL (ref 30.0–36.0)
MCV: 95.9 fL (ref 80.0–100.0)
Monocytes Absolute: 0.8 10*3/uL (ref 0.1–1.0)
Monocytes Relative: 9 %
Neutro Abs: 6 10*3/uL (ref 1.7–7.7)
Neutrophils Relative %: 69 %
Platelets: 206 10*3/uL (ref 150–400)
RBC: 4.83 MIL/uL (ref 3.87–5.11)
RDW: 12.8 % (ref 11.5–15.5)
WBC: 8.6 10*3/uL (ref 4.0–10.5)
nRBC: 0 % (ref 0.0–0.2)

## 2019-05-16 LAB — LACTIC ACID, PLASMA: Lactic Acid, Venous: 1.1 mmol/L (ref 0.5–1.9)

## 2019-05-16 LAB — PHOSPHORUS: Phosphorus: 2.8 mg/dL (ref 2.5–4.6)

## 2019-05-16 LAB — CBG MONITORING, ED: Glucose-Capillary: 103 mg/dL — ABNORMAL HIGH (ref 70–99)

## 2019-05-16 LAB — MAGNESIUM: Magnesium: 1.9 mg/dL (ref 1.7–2.4)

## 2019-05-16 MED ORDER — POTASSIUM CHLORIDE CRYS ER 20 MEQ PO TBCR
40.0000 meq | EXTENDED_RELEASE_TABLET | Freq: Once | ORAL | Status: AC
  Administered 2019-05-16: 40 meq via ORAL
  Filled 2019-05-16: qty 2

## 2019-05-16 MED ORDER — POTASSIUM CHLORIDE 10 MEQ/100ML IV SOLN
10.0000 meq | INTRAVENOUS | Status: AC
  Administered 2019-05-16 – 2019-05-17 (×3): 10 meq via INTRAVENOUS
  Filled 2019-05-16 (×3): qty 100

## 2019-05-16 MED ORDER — SODIUM CHLORIDE 0.9 % IV BOLUS
500.0000 mL | Freq: Once | INTRAVENOUS | Status: DC
  Administered 2019-05-16: 500 mL via INTRAVENOUS

## 2019-05-16 MED ORDER — SODIUM CHLORIDE 0.9 % IV BOLUS: 500.0000 mL | Freq: Once | INTRAVENOUS | Status: DC

## 2019-05-16 NOTE — ED Triage Notes (Signed)
Per EMS pts daughter called EMS because pt has "not been eating for 2 weeks." Pt has hx of dementia. Pt has no complaints at this time.

## 2019-05-16 NOTE — ED Notes (Signed)
Date and time results received: 05/16/19 2325 (use smartphrase ".now" to insert current time)  Test: potassium Critical Value: 2.4  Name of Provider Notified: Dr. Thurnell Garbe   Orders Received? Or Actions Taken?: no/na

## 2019-05-17 ENCOUNTER — Encounter (HOSPITAL_COMMUNITY): Payer: Self-pay | Admitting: *Deleted

## 2019-05-17 DIAGNOSIS — Z515 Encounter for palliative care: Secondary | ICD-10-CM

## 2019-05-17 DIAGNOSIS — E440 Moderate protein-calorie malnutrition: Secondary | ICD-10-CM

## 2019-05-17 DIAGNOSIS — R627 Adult failure to thrive: Secondary | ICD-10-CM

## 2019-05-17 DIAGNOSIS — E876 Hypokalemia: Secondary | ICD-10-CM | POA: Diagnosis not present

## 2019-05-17 DIAGNOSIS — Z7189 Other specified counseling: Secondary | ICD-10-CM

## 2019-05-17 DIAGNOSIS — F0390 Unspecified dementia without behavioral disturbance: Secondary | ICD-10-CM | POA: Diagnosis not present

## 2019-05-17 DIAGNOSIS — Z720 Tobacco use: Secondary | ICD-10-CM

## 2019-05-17 DIAGNOSIS — I5042 Chronic combined systolic (congestive) and diastolic (congestive) heart failure: Secondary | ICD-10-CM

## 2019-05-17 DIAGNOSIS — E43 Unspecified severe protein-calorie malnutrition: Secondary | ICD-10-CM

## 2019-05-17 DIAGNOSIS — J438 Other emphysema: Secondary | ICD-10-CM

## 2019-05-17 LAB — URINALYSIS, ROUTINE W REFLEX MICROSCOPIC
Bilirubin Urine: NEGATIVE
Glucose, UA: NEGATIVE mg/dL
Ketones, ur: NEGATIVE mg/dL
Nitrite: NEGATIVE
Protein, ur: 30 mg/dL — AB
Specific Gravity, Urine: 1.013 (ref 1.005–1.030)
pH: 5 (ref 5.0–8.0)

## 2019-05-17 LAB — BASIC METABOLIC PANEL
Anion gap: 6 (ref 5–15)
BUN: 8 mg/dL (ref 8–23)
CO2: 25 mmol/L (ref 22–32)
Calcium: 7.8 mg/dL — ABNORMAL LOW (ref 8.9–10.3)
Chloride: 111 mmol/L (ref 98–111)
Creatinine, Ser: 0.72 mg/dL (ref 0.44–1.00)
GFR calc Af Amer: >60 mL/min (ref >60)
GFR calc non Af Amer: >60 mL/min (ref >60)
Glucose, Bld: 92 mg/dL (ref 70–99)
Potassium: 3.8 mmol/L (ref 3.5–5.1)
Sodium: 142 mmol/L (ref 135–145)

## 2019-05-17 LAB — BRAIN NATRIURETIC PEPTIDE: B Natriuretic Peptide: 313 pg/mL — ABNORMAL HIGH (ref 0.0–100.0)

## 2019-05-17 LAB — SARS CORONAVIRUS 2 BY RT PCR (HOSPITAL ORDER, PERFORMED IN ~~LOC~~ HOSPITAL LAB): SARS Coronavirus 2: NEGATIVE

## 2019-05-17 MED ORDER — LORAZEPAM 2 MG/ML IJ SOLN
0.5000 mg | INTRAMUSCULAR | Status: AC | PRN
  Administered 2019-05-17 – 2019-05-18 (×2): 0.5 mg via INTRAVENOUS
  Filled 2019-05-17 (×2): qty 1

## 2019-05-17 MED ORDER — ENSURE ENLIVE PO LIQD
237.0000 mL | Freq: Two times a day (BID) | ORAL | Status: DC
  Administered 2019-05-17 – 2019-05-27 (×10): 237 mL via ORAL

## 2019-05-17 MED ORDER — ADULT MULTIVITAMIN W/MINERALS CH
1.0000 | ORAL_TABLET | Freq: Every day | ORAL | Status: DC
  Administered 2019-05-17 – 2019-05-26 (×7): 1 via ORAL
  Filled 2019-05-17 (×11): qty 1

## 2019-05-17 MED ORDER — ENOXAPARIN SODIUM 30 MG/0.3ML ~~LOC~~ SOLN
30.0000 mg | SUBCUTANEOUS | Status: DC
  Administered 2019-05-18 – 2019-05-26 (×7): 30 mg via SUBCUTANEOUS
  Filled 2019-05-17 (×10): qty 0.3

## 2019-05-17 MED ORDER — CARVEDILOL 3.125 MG PO TABS
3.1250 mg | ORAL_TABLET | Freq: Two times a day (BID) | ORAL | Status: DC
  Administered 2019-05-17 – 2019-05-26 (×14): 3.125 mg via ORAL
  Filled 2019-05-17 (×20): qty 1

## 2019-05-17 MED ORDER — POTASSIUM CHLORIDE IN NACL 20-0.9 MEQ/L-% IV SOLN
INTRAVENOUS | Status: AC
  Administered 2019-05-17: 19:00:00 via INTRAVENOUS

## 2019-05-17 MED ORDER — ACETAMINOPHEN 325 MG PO TABS: 650.0000 mg | ORAL_TABLET | Freq: Four times a day (QID) | ORAL | Status: DC | PRN

## 2019-05-17 MED ORDER — NICOTINE 14 MG/24HR TD PT24
14.0000 mg | MEDICATED_PATCH | Freq: Every day | TRANSDERMAL | Status: DC | PRN
  Administered 2019-05-20 – 2019-05-28 (×6): 14 mg via TRANSDERMAL
  Filled 2019-05-17 (×7): qty 1

## 2019-05-17 MED ORDER — POTASSIUM CHLORIDE IN NACL 40-0.9 MEQ/L-% IV SOLN
INTRAVENOUS | Status: DC
  Administered 2019-05-17: 75 mL/h via INTRAVENOUS
  Filled 2019-05-17 (×2): qty 1000

## 2019-05-17 MED ORDER — PROCHLORPERAZINE EDISYLATE 10 MG/2ML IJ SOLN: 5.0000 mg | INTRAMUSCULAR | Status: DC | PRN

## 2019-05-17 MED ORDER — ACETAMINOPHEN 650 MG RE SUPP: 650.0000 mg | Freq: Four times a day (QID) | RECTAL | Status: DC | PRN

## 2019-05-17 MED ORDER — ENOXAPARIN SODIUM 300 MG/3ML IJ SOLN: 20.0000 mg | INTRAMUSCULAR | Status: DC

## 2019-05-17 MED ORDER — MAGNESIUM SULFATE 2 GM/50ML IV SOLN
2.0000 g | Freq: Once | INTRAVENOUS | Status: AC
  Administered 2019-05-17: 2 g via INTRAVENOUS
  Filled 2019-05-17: qty 50

## 2019-05-17 NOTE — Progress Notes (Signed)
PROGRESS NOTE  Ruth SagesJudy M Davidson ZOX:[id number] DOB: [birth year] DOA: 05/16/2019 PCP: Avon GullyFanta, Tesfaye, MD  Brief History:  78 year old female with history of coronary artery disease status post remote PCI, systolic CHF EF 20% in 2018, tobacco abuse, COPD, dementia, hypothyroidism, and hyperlipidemia presenting with poor oral intake and increasing confusion.  Unfortunately, the patient is unable to provide any significant history.  The patient does not know why she is in the hospital.  The patient herself denies any fevers, chills, headache, chest pain, shortness breath, nausea, vomiting, diarrhea, abdominal pain.  According to patient's daughter, the patient has had very poor oral intake for the last 2 weeks and continues to smoke.  In addition, the patient has refused to take any medications for several months.  There are no reports of fevers, chills, chest pain, shortness breath, vomiting, diarrhea, abdominal pain.  Upon presentation, the patient was afebrile hemodynamically stable saturating 95% room air.  Potassium was noted to be 2.4 with serum creatinine 0.93.  Lactic acid was 1.1.  UA was negative for pyuria.  The patient was admitted for failure to thrive and dehydration.  Assessment/Plan: Failure to thrive -Check serum B12 -Folic acid -TSH -Free T4 -PT evaluation -UA--no significant pyuria  Hypokalemia -Repleted -Magnesium 1.9  Chronic systolic and diastolic CHF -06/22/2017 echo EF 15-20%, diffuse HK, grade 1 DD, dilated IVC, trivial TR -Appears clinically dry -Daily weights  COPD -Stable on room air  Tobacco abuse -Tobacco cessation discussed -no desire to quit  Coronary artery disease -No chest pain presently -Personally reviewed EKG--sinus rhythm, nonspecific T wave change  Severe protein calorie malnutrition -Continue Ensure  Dementia without behavior disturbance -daughter relates progressive cognitive decline over last 6 months -at risk for hospital  delirium  Hyperlipidemia -Continue statin  Goals of Care -palliative consult    Disposition Plan:   Home vs SNF 1-2 days Family Communication:   Daughter updated on phone 7/15  Consultants:  palliative  Code Status:  FULL   DVT Prophylaxis:   [place] Lovenox   Procedures: As Listed in Progress Note Above  Antibiotics: None    Total time spent 35 minutes.  Greater than 50% spent face to face counseling and coordinating care. 1655 to 1730    Subjective: Patient denies fevers, chills, headache, chest pain, dyspnea, nausea, vomiting, diarrhea, abdominal pain, dysuria, hematuria, hematochezia, and melena.   Objective: Vitals:   05/17/19 0215 05/17/19 0219 05/17/19 0551 05/17/19 1415  BP: (!) 119/45  (!) 101/54 (!) 110/56  Pulse: 99  (!) 112 96  Resp: 16  18 18   Temp: 98.7 F (37.1 C)   98.1 F (36.7 C)  TempSrc: Oral     SpO2:   91% 92%  Weight:  44.5 kg    Height:  5\' 3"  (1.6 m)      Intake/Output Summary (Last 24 hours) at 05/17/2019 1705 Last data filed at 05/17/2019 0500 Gross per 24 hour  Intake 380.34 ml  Output -  Net 380.34 ml   Weight change:  Exam:   General:  Pt is alert, follows commands appropriately, not in acute distress  HEENT: No icterus, No thrush, No neck mass, [place]/AT  Cardiovascular: RRR, S1/S2, no rubs, no gallops  Respiratory: bibasilar rales, no wheeze  Abdomen: Soft/+BS, non tender, non distended, no guarding  Extremities: No edema, No lymphangitis, No petechiae, No rashes, no synovitis   Data Reviewed: I have personally reviewed following labs and imaging studies Basic Metabolic Panel:  Recent Labs  Lab 05/16/19 2232 05/17/19 1109  NA 141 142  K 2.4* 3.8  CL 101 111  CO2 30 25  GLUCOSE 118* 92  BUN 9 8  CREATININE 0.93 0.72  CALCIUM 8.9 7.8*  MG 1.9  --   PHOS 2.8  --    Liver Function Tests: Recent Labs  Lab 05/16/19 2232  AST 15  ALT 9  ALKPHOS 71  BILITOT 0.6  PROT 6.6  ALBUMIN 3.4*   No results for  input(s): LIPASE, AMYLASE in the last 168 hours. No results for input(s): AMMONIA in the last 168 hours. Coagulation Profile: No results for input(s): INR, PROTIME in the last 168 hours. CBC: Recent Labs  Lab 05/16/19 2232  WBC 8.6  NEUTROABS 6.0  HGB 14.8  HCT 46.3*  MCV 95.9  PLT 206   Cardiac Enzymes: No results for input(s): CKTOTAL, CKMB, CKMBINDEX, TROPONINI in the last 168 hours. BNP: Invalid input(s): POCBNP CBG: Recent Labs  Lab 05/16/19 2307  GLUCAP 103*   HbA1C: No results for input(s): HGBA1C in the last 72 hours. Urine analysis:    Component Value Date/Time   COLORURINE YELLOW 05/17/2019 0020   APPEARANCEUR HAZY (A) 05/17/2019 0020   LABSPEC 1.013 05/17/2019 0020   PHURINE 5.0 05/17/2019 0020   GLUCOSEU NEGATIVE 05/17/2019 0020   HGBUR LARGE (A) 05/17/2019 0020   BILIRUBINUR NEGATIVE 05/17/2019 0020   KETONESUR NEGATIVE 05/17/2019 0020   PROTEINUR 30 (A) 05/17/2019 0020   NITRITE NEGATIVE 05/17/2019 0020   LEUKOCYTESUR TRACE (A) 05/17/2019 0020   Sepsis Labs: @LABRCNTIP (procalcitonin:4,lacticidven:4) ) Recent Results (from the past 240 hour(s))  SARS Coronavirus 2 (CEPHEID - Performed in [facility] lab), Hosp Order     Status: None   Collection Time: 05/17/19 12:15 AM   Specimen: Nasopharyngeal Swab  Result Value Ref Range Status   SARS Coronavirus 2 NEGATIVE NEGATIVE Final    Comment: (NOTE) If result is NEGATIVE SARS-CoV-2 target nucleic acids are NOT DETECTED. The SARS-CoV-2 RNA is generally detectable in upper and lower  respiratory specimens during the acute phase of infection. The lowest  concentration of SARS-CoV-2 viral copies this assay can detect is 250  copies / mL. A negative result does not preclude SARS-CoV-2 infection  and should not be used as the sole basis for treatment or other  patient management decisions.  A negative result may occur with  improper specimen collection / handling, submission of specimen other   than nasopharyngeal swab, presence of viral mutation(s) within the  areas targeted by this assay, and inadequate number of viral copies  (<250 copies / mL). A negative result must be combined with clinical  observations, patient history, and epidemiological information. If result is POSITIVE SARS-CoV-2 target nucleic acids are DETECTED. The SARS-CoV-2 RNA is generally detectable in upper and lower  respiratory specimens dur ing the acute phase of infection.  Positive  results are indicative of active infection with SARS-CoV-2.  Clinical  correlation with patient history and other diagnostic information is  necessary to determine patient infection status.  Positive results do  not rule out bacterial infection or co-infection with other viruses. If result is PRESUMPTIVE POSTIVE SARS-CoV-2 nucleic acids MAY BE PRESENT.   A presumptive positive result was obtained on the submitted specimen  and confirmed on repeat testing.  While 2019 novel coronavirus  (SARS-CoV-2) nucleic acids may be present in the submitted sample  additional confirmatory testing may be necessary for epidemiological  and / or clinical management purposes  to differentiate between  SARS-CoV-2 and other Sarbecovirus currently known to infect humans.  If clinically indicated additional testing with an alternate test  methodology [phone]([id number]) is advised. The SARS-CoV-2 RNA is generally  detectable in upper and lower respiratory sp ecimens during the acute  phase of infection. The expected result is Negative. Fact Sheet for Patients:  BoilerBrush.com.cyhttps://www.fda.gov/media/136312/download Fact Sheet for Healthcare Providers: https://pope.com/https://www.fda.gov/media/136313/download This test is not yet approved or cleared by the [place] FDA and has been authorized for detection and/or diagnosis of SARS-CoV-2 by FDA under an Emergency Use Authorization (EUA).  This EUA will remain in effect (meaning this test can be used) for the duration of the  COVID-19 declaration under Section 564(b)(1) of the Act, 21 U.S.C. section 360bbb-3(b)(1), unless the authorization is terminated or revoked sooner. Performed at [facility], [address] [place], [place] [address]      Scheduled Meds: . [START ON 05/18/2019] enoxaparin (LOVENOX) injection  30 mg Subcutaneous Q24H  . feeding supplement (ENSURE ENLIVE)  237 mL Oral BID BM  . multivitamin with minerals  1 tablet Oral Daily   Continuous Infusions:  Procedures/Studies: Dg Chest 2 View  Result Date: 05/06/2019 CLINICAL DATA:  Altered mental status.  Involuntary commitment. EXAM: CHEST - 2 VIEW COMPARISON:  Chest radiograph 06/21/2017 FINDINGS: Stable cardiac and mediastinal contours. Pulmonary hyperinflation. Emphysematous changes. No large area of pulmonary consolidation. No pleural effusion or pneumothorax. IMPRESSION: No acute cardiopulmonary process. Electronically Signed   By: Annia Beltrew  Davis M.D.   On: 05/06/2019 19:46    Catarina Hartshornavid Reshad Saab, DO  [facility] Pager [phone]  If 7PM-7AM, please contact night-coverage [email or web address] Password [email or web address] 05/17/2019, 5:05 PM   LOS: 0 days

## 2019-05-17 NOTE — Care Management Obs Status (Signed)
MEDICARE OBSERVATION STATUS NOTIFICATION   Patient Details  Name: Ruth Davidson MRN: [id number] Date of Birth: 10-17-1941   Medicare Observation Status Notification Given:  Yes    Tommy Medal 05/17/2019, 2:11 PM

## 2019-05-17 NOTE — H&P (Signed)
History and Physical    Ruth SagesJudy M Davidson ZOX:[id number] DOB: [birth year] DOA: 05/16/2019  PCP: Patient, No Pcp Per   Patient coming from: Home.  I have personally briefly [facility]  Chief Complaint: Has not eaten in 2 weeks.  HPI: Ruth Davidson is a 78 y.o. female with medical history significant of CAD, history of MI, cardiac cachexia, chronic diastolic CHF, COPD, tobacco use, dementia, medical treatment noncompliance, hypothyroidism who is coming to the emergency department brought by her daughter due to mild worsening in dementia and lack of appetite for the past 2 weeks.  The patient is unable to provide further history.  ED Course: Initial vital signs temperature 98.5 F, pulse 107, respirations 17, blood pressure 129/55 mmHg and O2 sat 99% on room air.  The patient received potassium replacement and magnesium supplementation in the emergency department.  I ordered Ativan 0.5 mg IVP after the patient tried to get out of bed multiple times.  She seems to be confused and asked the same questions multiple times.  Her urinalysis showed large hemoglobinuria, trace leukocyte esterase and rare bacteria.  There was proteinuria 30 mg/dL.  CBC showed a mildly increased hematocrit, but was otherwise normal.  CMP shows a potassium of 2.4 mmol/L, glucose of 118 mg/dL and albumin of 3.4 g/dL.  All other values including renal and hepatic functions are within normal limits.  Lactic acid was normal.  Magnesium and phosphorus are within expected values.  Review of Systems: Currently sedated.  Unable to perform.  Past Medical History:  Diagnosis Date  . Cardiac cachexia   . CHF (congestive heart failure) (HCC)   . COPD (chronic obstructive pulmonary disease) (HCC)   . Coronary artery disease   . Dementia (HCC)   . MI (myocardial infarction) (HCC)   . Non-compliance   . Thyroid disease   . Tobacco use     Past Surgical History:  Procedure Laterality  Date  . CHOLECYSTECTOMY    . CORONARY ANGIOPLASTY WITH STENT PLACEMENT  2003  . THYROIDECTOMY     goiter/no cancer, per report     reports that she has been smoking cigarettes. She started smoking about 60 years ago. She has a 50.00 pack-year smoking history. She has never used smokeless tobacco. She reports that she does not drink alcohol or use drugs.  No Known Allergies  Family History  Problem Relation Age of Onset  . Hypertension Father        Died in war  . Diabetes Mother   . Breast cancer Mother   . Pneumonia Sister   . Hypertension Son    Prior to Admission medications   Medication Sig Start Date End Date Taking? Authorizing Provider  aspirin 81 MG chewable tablet Chew 1 tablet (81 mg total) by mouth daily. Restart on 06/28/17 Patient not taking: Reported on 05/06/2019 06/28/17   Tat, Onalee Huaavid, MD  carvedilol (COREG) 3.125 MG tablet TAKE 1 TABLET BY MOUTH TWICE DAILY WITH A MEAL Patient not taking: Reported on 05/06/2019 07/19/18   Laqueta LindenKoneswaran, Suresh A, MD  furosemide (LASIX) 40 MG tablet TAKE ONE TABLET BY MOUTH ONCE DAILY Patient not taking: Reported on 05/06/2019 11/08/17   Laqueta LindenKoneswaran, Suresh A, MD  nitroGLYCERIN (NITROSTAT) 0.4 MG SL tablet Place 1 tablet (0.4 mg total) under the tongue every 5 (five) minutes as needed for chest pain (or back pain or arm pain). Patient not taking: Reported on 05/06/2019 11/10/15   Dione BoozeGlick, Shanai Lartigue, MD  potassium  chloride SA (K-DUR,KLOR-CON) 20 MEQ tablet TAKE 1 TABLET BY MOUTH ONCE DAILY Patient not taking: Reported on 05/06/2019 05/11/18   Herminio Commons, MD  simvastatin (ZOCOR) 80 MG tablet TAKE ONE TABLET BY MOUTH ONCE DAILY AT BEDTIME Patient not taking: Reported on 05/06/2019 11/08/17   Herminio Commons, MD    Physical Exam: Vitals:   05/16/19 2300 05/17/19 0015 05/17/19 0050 05/17/19 0051  BP: (!) 85/45  114/62 109/66  Pulse: 92  94 98  Resp: 20  (!) 22 (!) 22  Temp:  98.9 F (37.2 C)    TempSrc:  Rectal    SpO2: 95%  95% 95%     Constitutional: Under nourished, but currently in NAD, calm, comfortable Eyes: PERRL, lids and conjunctivae normal ENMT: Mucous membranes are mildly dry.. Posterior pharynx clear of any exudate or lesions. Neck: normal, supple, no masses, no thyromegaly Respiratory: clear to auscultation bilaterally, no wheezing, no crackles. Normal respiratory effort. No accessory muscle use.  Cardiovascular: Regular rate and rhythm, no murmurs / rubs / gallops. No extremity edema. 2+ pedal pulses. No carotid bruits.  Abdomen: Soft, no tenderness, no masses palpated. No hepatosplenomegaly. Bowel sounds positive.  Musculoskeletal: no clubbing / cyanosis. Good ROM, no contractures. Normal muscle tone.  Skin: no rashes, lesions, ulcers on very limited dermatological examination. Neurologic: Sedated.  Grossly nonfocal. Psychiatric: Somnolent.  Sedated.   Labs on Admission: I have personally reviewed following labs and imaging studies  CBC: Recent Labs  Lab 05/16/19 2232  WBC 8.6  NEUTROABS 6.0  HGB 14.8  HCT 46.3*  MCV 95.9  PLT 009   Basic Metabolic Panel: Recent Labs  Lab 05/16/19 2232  NA 141  K 2.4*  CL 101  CO2 30  GLUCOSE 118*  BUN 9  CREATININE 0.93  CALCIUM 8.9  MG 1.9  PHOS 2.8   GFR: CrCl cannot be calculated (Unknown ideal weight.). Liver Function Tests: Recent Labs  Lab 05/16/19 2232  AST 15  ALT 9  ALKPHOS 71  BILITOT 0.6  PROT 6.6  ALBUMIN 3.4*   No results for input(s): LIPASE, AMYLASE in the last 168 hours. No results for input(s): AMMONIA in the last 168 hours. Coagulation Profile: No results for input(s): INR, PROTIME in the last 168 hours. Cardiac Enzymes: No results for input(s): CKTOTAL, CKMB, CKMBINDEX, TROPONINI in the last 168 hours. BNP (last 3 results) No results for input(s): PROBNP in the last 8760 hours. HbA1C: No results for input(s): HGBA1C in the last 72 hours. CBG: Recent Labs  Lab 05/16/19 2307  GLUCAP 103*   Lipid Profile: No  results for input(s): CHOL, HDL, LDLCALC, TRIG, CHOLHDL, LDLDIRECT in the last 72 hours. Thyroid Function Tests: No results for input(s): TSH, T4TOTAL, FREET4, T3FREE, THYROIDAB in the last 72 hours. Anemia Panel: No results for input(s): VITAMINB12, FOLATE, FERRITIN, TIBC, IRON, RETICCTPCT in the last 72 hours. Urine analysis:    Component Value Date/Time   COLORURINE YELLOW 05/17/2019 0020   APPEARANCEUR HAZY (A) 05/17/2019 0020   LABSPEC 1.013 05/17/2019 0020   PHURINE 5.0 05/17/2019 0020   GLUCOSEU NEGATIVE 05/17/2019 0020   HGBUR LARGE (A) 05/17/2019 0020   BILIRUBINUR NEGATIVE 05/17/2019 0020   KETONESUR NEGATIVE 05/17/2019 0020   PROTEINUR 30 (A) 05/17/2019 0020   NITRITE NEGATIVE 05/17/2019 0020   LEUKOCYTESUR TRACE (A) 05/17/2019 0020    Radiological Exams on Admission: No results found.  EKG: Independently reviewed.  Vent. rate 87 BPM PR interval * ms QRS duration 110 ms QT/QTc 377/454  ms P-R-T axes 64 -44 120 Sinus rhythm Probable left atrial enlargement Inferior infarct, old Probable anterior infarct, age indeterminate  Assessment/Plan Principal Problem:   Hypokalemia Observation/telemetry. Her EKG show U waves. Will repeat EKG in a.m. Continue potassium supplementation. Follow-up potassium level.  Active Problems:   CAD S/P remote PCI Continue aspirin, carvedilol and simvastatin. Continue PRN nitroglycerin. Follow-up with cardiology as scheduled.    Hyperlipidemia Continue simvastatin 80 mg p.o. daily.    Tobacco abuse Nicotine replacement therapy as needed.    Emphysema of lung (HCC) Supplemental oxygen. Bronchodilators as needed.    Dementia (HCC)   Chronic combined systolic and diastolic CHF (congestive heart failure) (HCC) No signs of decompensation at this time. Continue furosemide but will need more potassium replacement. Continue carvedilol 3.125 mg p.o. twice daily    DVT prophylaxis: Lovenox SQ. Code Status: Full code. Family  Communication: Disposition Plan: Observation for potassium replacement. Consults called: Case management. Admission status: Observation/telemetry.   Bobette Moavid Manuel Sherolyn Trettin MD [facility]  05/17/2019, 1:35 AM   This document was prepared using Dragon voice recognition software and may contain some unintended transcription errors.

## 2019-05-17 NOTE — Consult Note (Signed)
Consultation Note Date: 05/17/2019   Patient Name: Ruth Davidson  DOB: 05/21/1941  MRN: [id number]  Age / Sex: 78 y.o., female  PCP: Avon GullyFanta, Tesfaye, MD Referring Physician: Catarina Hartshornat, David, MD  Reason for Consultation: Establishing goals of care  HPI/Patient Profile: 78 y.o. 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 In ED, potassium of 2.4. Received potassium supplements and IVF initiated. Home medications continued for CAD and CHF. Palliative medicine consultation for goals of care.  Clinical Assessment and Goals of Care:  I have reviewed medical records, discussed with care team, and assessed the patient at bedside. Ruth Davidson is resting comfortably. She does wake to voice but remains drowsy and disoriented. No s/s of pain or discomfort.   Shortly after, spoke with daughter Ruth Prader([name]) via telephone to discuss goals of care.   I introduced Palliative Medicine as specialized medical care for people living with serious illness. It focuses on providing relief from the symptoms and stress of a serious illness. The goal is to improve quality of life for both the patient and the family.  We discussed a brief life review of the patient. Ruth Davidson has been living with daughter Ruth Prader([name]) for about three years. She also has two sons that live locally but Ruth Davidson is primary caregiver and legal guardian (documentation completed May 02, 2019 for guardianship). Ruth Davidson reports that her mother is a stubborn individual, who throughout her life, often refused to visit doctors and after heart attacks, refused to take her medications because she felt she didn't need them. Ruth Davidson continues to smoke. With worsening dementia, she refuses to take baths and with very poor nutritional status in  the last 2-3 weeks.  Discussed events leading up to admission and [name]'s fear that her mother was dying at home, with refusal of food and drink. "I can't let my mama just lay on my couch and die." Ruth Davidson is most concerned about nursing home placement. Ruth Davidson acknowledges that she "can't take care of her" while working full time and her husband travels for work. Ruth Davidson tried to get her into [facility] but was told she was too functional, still able to ambulate independently.   Discussed hospital diagnoses and interventions. Discussed disease trajectory of dementia and with other chronic conditions including heart failure and COPD.   I attempted to elicit values and goals of care important to the patient and family. Advanced directives, concepts specific to code status, artifical feeding and hydration, and rehospitalization were considered and discussed. Introduced and discussed MOST form. Ruth Davidson shares that her mother would NOT want a feeding tube. Ruth Davidson speaks of her desire for 'DNR' for her mother, sharing that we would "break every rib" with how frail her mother is. Agreed with daughter, sharing medical recommendation against heroic interventions with underlying progressive dementia and chronic conditions. We discussed these interventions not impacting her mother's quality of life. Ruth Davidson states "I don't want  her to lay there like a vegetable" or go through "no more than she's already been through."   Although Ruth Davidson is legal guardian, she does wish to discuss advance directives/code status with her two brothers prior to making decisions or completing MOST form. Ruth Davidson plans to speak with her brothers tonight. We discussed plan for f/u conference call tomorrow, 7/16 at 11am and plan to include her brothers in this conversation.   Questions and concerns were addressed. PMT contact information given.    SUMMARY OF RECOMMENDATIONS    Initial palliative discussion with patient's daughter, Lowella DandyFondra Finney.  Recently granted legal guardianship over her mother.   Continue medical management per attending.   Daughter is most concerned about finding placement for her mother. She is unable to care for her at home. SW consult placed.  Introduced and discussed MOST form. Daughter leaning away from heroic interventions including resuscitation or feeding tube, but would like to further discuss with her two brothers this evening.  F/u GOC conference call with Ruth Davidson and her brothers tomorrow, 7/16 at 11am.   Code Status/Advance Care Planning:  Full code  Symptom Management:   Consider appetite stimulant  Palliative Prophylaxis:   Aspiration, Delirium Protocol, Oral Care and Turn Reposition  Psycho-social/Spiritual:   Desire for further Chaplaincy support:yes  Additional Recommendations: Caregiving  Support/Resources, Compassionate Wean Education and Education on Hospice  Prognosis:   Unable to determine  Discharge Planning: To Be Determined      Primary Diagnoses: Present on Admission:  Hypokalemia  Chronic combined systolic and diastolic CHF (congestive heart failure) (HCC)  Dementia (HCC)  Emphysema of lung (HCC)  Hyperlipidemia  Tobacco abuse   I have reviewed the medical record, interviewed the patient and family, and examined the patient. The following aspects are pertinent.  Past Medical History:  Diagnosis Date   Cardiac cachexia    CHF (congestive heart failure) (HCC)    COPD (chronic obstructive pulmonary disease) (HCC)    Coronary artery disease    Dementia (HCC)    MI (myocardial infarction) (HCC)    Non-compliance    Thyroid disease    Tobacco use    Social History   Socioeconomic History   Marital status: Widowed    Spouse name: Not on file   Number of children: Not on file   Years of education: Not on file   Highest education level: Not on file  Occupational History   Not on file  Social Needs   Financial resource strain:  Not on file   Food insecurity    Worry: Not on file    Inability: Not on file   Transportation needs    Medical: Not on file    Non-medical: Not on file  Tobacco Use   Smoking status: Current Every Day Smoker    Packs/day: 1.00    Years: 50.00    Pack years: 50.00    Types: Cigarettes    Start date: 11/02/1958   Smokeless tobacco: Never Used  Substance and Sexual Activity   Alcohol use: No    Alcohol/week: 0.0 standard drinks   Drug use: No   Sexual activity: Never  Lifestyle   Physical activity    Days per week: Not on file    Minutes per session: Not on file   Stress: Not on file  Relationships   Social connections    Talks on phone: Not on file    Gets together: Not on file    Attends religious service: Not on file  Active member of club or organization: Not on file    Attends meetings of clubs or organizations: Not on file    Relationship status: Not on file  Other Topics Concern   Not on file  Social History Narrative   Widowed. 3 children. Lives with daughter for one year. Retired. Used to work at [employer].    Family History  Problem Relation Age of Onset   Hypertension Father        Died in war   Diabetes Mother    Breast cancer Mother    Pneumonia Sister    Hypertension Son    Scheduled Meds:  [START ON 05/18/2019] enoxaparin (LOVENOX) injection  30 mg Subcutaneous Q24H   feeding supplement (ENSURE ENLIVE)  237 mL Oral BID BM   Continuous Infusions:  0.9 % NaCl with KCl 40 mEq / L 75 mL/hr (05/17/19 0330)   PRN Meds:.acetaminophen **OR** acetaminophen, LORazepam, nicotine, prochlorperazine Medications Prior to Admission:  Prior to Admission medications   Medication Sig Start Date End Date Taking? Authorizing Provider  nitroGLYCERIN (NITROSTAT) 0.4 MG SL tablet Place 1 tablet (0.4 mg total) under the tongue every 5 (five) minutes as needed for chest pain (or back pain or arm pain). 11/10/15  Yes Dione BoozeGlick, David, MD  aspirin 81 MG chewable  tablet Chew 1 tablet (81 mg total) by mouth daily. Restart on 06/28/17 Patient not taking: Reported on 05/06/2019 06/28/17   Tat, Onalee Huaavid, MD  carvedilol (COREG) 3.125 MG tablet TAKE 1 TABLET BY MOUTH TWICE DAILY WITH A MEAL Patient not taking: Reported on 05/06/2019 07/19/18   Laqueta LindenKoneswaran, Suresh A, MD  furosemide (LASIX) 40 MG tablet TAKE ONE TABLET BY MOUTH ONCE DAILY Patient not taking: Reported on 05/06/2019 11/08/17   Laqueta LindenKoneswaran, Suresh A, MD  potassium chloride SA (K-DUR,KLOR-CON) 20 MEQ tablet TAKE 1 TABLET BY MOUTH ONCE DAILY Patient not taking: Reported on 05/06/2019 05/11/18   Laqueta LindenKoneswaran, Suresh A, MD  simvastatin (ZOCOR) 80 MG tablet TAKE ONE TABLET BY MOUTH ONCE DAILY AT BEDTIME Patient not taking: Reported on 05/06/2019 11/08/17   Laqueta LindenKoneswaran, Suresh A, MD   No Known Allergies Review of Systems  Unable to perform ROS: Dementia   Physical Exam Vitals signs and nursing note reviewed.  Constitutional:      Appearance: She is cachectic. She is ill-appearing.  HENT:     Head: Normocephalic and atraumatic.  Cardiovascular:     Rate and Rhythm: Normal rate.  Pulmonary:     Effort: No tachypnea, accessory muscle usage or respiratory distress.     Breath sounds: Normal breath sounds.  Abdominal:     Tenderness: There is no abdominal tenderness.  Skin:    General: Skin is warm and dry.  Neurological:     Mental Status: She is easily aroused.     Comments: Drowsy, disoriented to orientation questions.  Psychiatric:        Attention and Perception: She is inattentive.        Speech: Speech is delayed.        Cognition and Memory: Cognition is impaired.    Vital Signs: BP (!) 101/54 (BP Location: Left Arm)    Pulse (!) 112    Temp 98.7 F (37.1 C) (Oral)    Resp 18    Ht 5\' 3"  (1.6 m)    Wt 44.5 kg    SpO2 91%    BMI 17.38 kg/m  Pain Scale: 0-10   Pain Score: 0-No pain   SpO2: SpO2: 91 % O2  Device:SpO2: 91 % O2 Flow Rate: .   IO: Intake/output summary:   Intake/Output Summary (Last 24  hours) at 05/17/2019 1338 Last data filed at 05/17/2019 0500 Gross per 24 hour  Intake 380.34 ml  Output --  Net 380.34 ml    LBM:   Baseline Weight: Weight: 44.5 kg Most recent weight: Weight: 44.5 kg     Palliative Assessment/Data: PPS 40%   Flowsheet Rows     Most Recent Value  Intake Tab  Referral Department  Hospitalist  Unit at Time of Referral  Med/Surg Unit  Palliative Care Primary Diagnosis  Neurology  Palliative Care Type  New Palliative care  Reason for referral  Clarify Goals of Care  Date first seen by Palliative Care  05/17/19  Clinical Assessment  Palliative Performance Scale Score  40%  Psychosocial & Spiritual Assessment  Palliative Care Outcomes  Patient/Family meeting held?  Yes  Who was at the meeting?  daughter  Palliative Care Outcomes  Clarified goals of care, Provided psychosocial or spiritual support, ACP counseling assistance      Time In/Out: 1050-1105, 1200-1255 Time Total: 70 min Greater than 50%  of this time was spent counseling and coordinating care related to the above assessment and plan.  Signed by:  Ihor Dow, DNP, FNP-C Palliative Medicine Team  Phone: [phone] Fax: [phone]   Please contact Palliative Medicine Team phone at [phone] for questions and concerns.  For individual provider: See Shea Evans

## 2019-05-17 NOTE — Progress Notes (Signed)
Initial Nutrition Assessment  DOCUMENTATION CODES:   Severe malnutrition in context of acute illness/injury  INTERVENTION:  Recommend liberalize diet to regular   Ensure Enlive po BID, each supplement provides 350 kcal and 20 grams of protein    NUTRITION DIAGNOSIS:   Severe Malnutrition related to acute change in meal intake/mild worsening of dementia illness on chronic dementia and cardiac cachexia) as evidenced by energy intake < or equal to 50% for > or equal to 5 days, severe loss muscle and fat mass.   GOAL:  (meet needs as able based on pt wishes to limit agressive intervention nutritionally)  MONITOR:    REASON FOR ASSESSMENT:   Malnutrition Screening Tool    ASSESSMENT: Patient is an underweight 79 yo female with history of dementia, COPD (emphysema), CHF, Cardiac cachexia. Talked with her daughter to obtain nutrition history.   Patient stopped eating around 2 weeks ago. She has been drinking regular Sunkist and coffee. No oral nutrition supplements or milk. Patient tells daughter "I'll eat when I get hungry". She ate a few bites of hamburger and mashed potatoes last night before coming to the hospital. Able to feed herself at baseline.   Patient breakfast and lunch tray are in the room and untouched along with an unopened bottle of Ensure. Palliative medicine notes that patient does not want aggressive nutrition intervention such as tube feeding.    Weight 99 lb (45 kg) at Dr Josephine Cables office in June. Currently 98 lb (44.5 kg). In the past 3 years her weight has ranged mostly between 39-41 kg.   Labs reviewed: Potassium 2.4 (L), Glu 118  Medications reviewed and include: Ensure Enlive BID, Nicoderm.   NUTRITION - FOCUSED PHYSICAL EXAM:  Severe depletion of orbital and thoraic fat and severe loss of muscle temples, acromion and clavicles   Diet Order:   Diet Order            Diet Heart Room service appropriate? Yes; Fluid consistency: Thin  Diet effective now              EDUCATION NEEDS:  Education needs have been addressed    Skin:  Skin Assessment: Reviewed RN Assessment  Last BM:  unknown  Height:   Ht Readings from Last 1 Encounters:  05/17/19 5\' 3"  (1.6 m)    Weight:   Wt Readings from Last 1 Encounters:  05/17/19 44.5 kg    Ideal Body Weight:  52 kg  BMI:  Body mass index is 17.38 kg/m.  Estimated Nutritional Needs:   Kcal:  2025-4270  Protein:  63-68 gr  Fluid:  >1100 ml daily   Colman Cater MS,RD,CSG,LDN Office: [phone] Pager: [phone]

## 2019-05-17 NOTE — ED Provider Notes (Addendum)
[facility] Provider Note   CSN: [id number] Arrival date & time: 05/16/19  2159     History   Chief Complaint Chief Complaint  Patient presents with  . Eating Disorder    HPI Ruth SagesJudy M Davidson is a 78 y.o. female.     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  She lives at home with her daughter and was sent in by her daughter via EMS for continued lack of p.o. intake.  Patient does not know why she is here and does not think anything is wrong.  She has no complaints.  Collateral information obtained from patient's daughter, Ms. Ruth Davidson who is POA.  She states that the patient lives with her and has not wanted to eat anything for 2 weeks.  All the patient wants to do is smoke all day long and drinks Sunkist.  She has not had any substantial nutrition in 2 weeks.  She also reports that the patient has refused to take any medications for several months.  She has not been receiving any diuretic therapy.  Past Medical History:  Diagnosis Date  . Cardiac cachexia   . CHF (congestive heart failure) (HCC)   . COPD (chronic obstructive pulmonary disease) (HCC)   . Coronary artery disease   . Dementia (HCC)   . MI (myocardial infarction) (HCC)   . Non-compliance   . Thyroid disease   . Tobacco use     Patient Active Problem List   Diagnosis Date Noted  . Orthostatic hypotension 06/22/2017  . Chronic combined systolic and diastolic CHF (congestive heart failure) (HCC) 06/22/2017  . Goals of care, counseling/discussion   . Palliative care encounter   . Pre-syncope 06/21/2017  . Subdural hematoma (HCC) 06/21/2017  . Acute on chronic systolic CHF (congestive heart failure) (HCC) 08/28/2016  . Noncompliance 08/28/2016  . 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 Emphysema of lung (HCC)  06/11/2016  . 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 Dementia (HCC) 06/11/2016  . NSVT (nonsustained ventricular tachycardia) (HCC) 04/15/2016  . Acute systolic CHF (congestive heart failure) (HCC) 04/15/2016  . Community acquired pneumonia   . CAD S/P remote PCI 08/30/2013  . Hyperlipidemia 08/30/2013  . Tobacco abuse 08/30/2013    Past Surgical History:  Procedure Laterality Date  . CHOLECYSTECTOMY    . CORONARY ANGIOPLASTY WITH STENT PLACEMENT  2003  . THYROIDECTOMY     goiter/no cancer, per report     OB History   No obstetric history on file.      Home Medications    Prior to Admission medications   Medication Sig Start Date End Date Taking? Authorizing Provider  aspirin 81 MG chewable tablet Chew 1 tablet (81 mg total) by mouth daily. Restart on 06/28/17 Patient not taking: Reported on 05/06/2019 06/28/17   Tat, Onalee Huaavid, MD  carvedilol (COREG) 3.125 MG tablet TAKE 1 TABLET BY MOUTH TWICE DAILY WITH A MEAL Patient not taking: Reported on 05/06/2019 07/19/18   Laqueta LindenKoneswaran, Suresh A, MD  furosemide (LASIX) 40 MG tablet TAKE ONE TABLET BY MOUTH ONCE DAILY Patient not taking: Reported on 05/06/2019 11/08/17   Laqueta LindenKoneswaran, Suresh A, MD  nitroGLYCERIN (NITROSTAT) 0.4 MG SL tablet Place 1 tablet (0.4 mg total) under the tongue every 5 (five) minutes as needed for chest pain (or back pain or arm pain). Patient not taking: Reported on  05/06/2019 11/10/15   Dione BoozeGlick, David, MD  potassium chloride SA (K-DUR,KLOR-CON) 20 MEQ tablet TAKE 1 TABLET BY MOUTH ONCE DAILY Patient not taking: Reported on 05/06/2019 05/11/18   Laqueta LindenKoneswaran, Suresh A, MD  simvastatin (ZOCOR) 80 MG tablet TAKE ONE TABLET BY MOUTH ONCE DAILY AT BEDTIME Patient not taking: Reported on 05/06/2019 11/08/17   Laqueta LindenKoneswaran, Suresh A, MD    Family History Family History  Problem Relation Age of Onset  . Hypertension Father        Died in war  . Diabetes Mother   . Breast cancer Mother   . Pneumonia Sister   . Hypertension Son     Social History  Social History   Tobacco Use  . Smoking status: Current Every Day Smoker    Packs/day: 1.00    Years: 50.00    Pack years: 50.00    Types: Cigarettes    Start date: 11/02/1958  . Smokeless tobacco: Never Used  Substance Use Topics  . Alcohol use: No    Alcohol/week: 0.0 standard drinks  . Drug use: No     Allergies   Patient has no known allergies.   Review of Systems Review of Systems  Respiratory: Negative for chest tightness and shortness of breath.   Cardiovascular: Negative for chest pain and leg swelling.  Gastrointestinal: Negative for diarrhea, nausea and vomiting.   Unable to perform full ROS due to dementia  Physical Exam Updated Vital Signs BP (!) 85/45   Pulse 92   Temp 98.5 F (36.9 C) (Oral)   Resp 20   SpO2 95%   Physical Exam Vitals signs and nursing note reviewed.  Constitutional:      General: She is not in acute distress.    Appearance: She is well-developed. She is ill-appearing.     Comments: Chronically ill and poorly nourished appearing. Oriented to person. Pleasant and cooperative, but confused.   HENT:     Head: Normocephalic and atraumatic.     Mouth/Throat:     Mouth: Mucous membranes are moist.  Eyes:     Conjunctiva/sclera: Conjunctivae normal.     Pupils: Pupils are equal, round, and reactive to light.  Neck:     Musculoskeletal: Normal range of motion and neck supple.  Cardiovascular:     Rate and Rhythm: Normal rate and regular rhythm.     Heart sounds: S1 normal and S2 normal. No murmur.  Pulmonary:     Effort: Pulmonary effort is normal.     Breath sounds: No wheezing or rales.     Comments: Slightly diminished lung sound in bases.  Abdominal:     General: There is no distension.     Palpations: Abdomen is soft.     Tenderness: There is no abdominal tenderness. There is no guarding.  Musculoskeletal: Normal range of motion.        General: No deformity.     Right lower leg: No edema.     Left lower leg: No edema.   Lymphadenopathy:     Cervical: No cervical adenopathy.  Skin:    General: Skin is warm and dry.     Findings: No erythema or rash.  Neurological:     Mental Status: She is alert.     Comments: Cranial nerves grossly intact. Patient moves extremities symmetrically and with good coordination.  Psychiatric:        Behavior: Behavior normal.        Thought Content: Thought content normal.  Judgment: Judgment normal.      ED Treatments / Results  Labs (all labs ordered are listed, but only abnormal results are displayed) Labs Reviewed  COMPREHENSIVE METABOLIC PANEL - Abnormal; Notable for the following components:      Result Value   Potassium 2.4 (*)    Glucose, Bld 118 (*)    Albumin 3.4 (*)    GFR calc non Af Amer 59 (*)    All other components within normal limits  CBC WITH DIFFERENTIAL/PLATELET - Abnormal; Notable for the following components:   HCT 46.3 (*)    All other components within normal limits  CBG MONITORING, ED - Abnormal; Notable for the following components:   Glucose-Capillary 103 (*)    All other components within normal limits  SARS CORONAVIRUS 2 (HOSPITAL ORDER, [place] [facility])  MAGNESIUM  LACTIC ACID, PLASMA  PHOSPHORUS  URINALYSIS, ROUTINE W REFLEX MICROSCOPIC  LACTIC ACID, PLASMA    EKG EKG Interpretation  Date/Time:  Tuesday May 16 2019 22:47:25 EDT Ventricular Rate:  87 PR Interval:    QRS Duration: 110 QT Interval:  377 QTC Calculation: 454 R Axis:   -44 Text Interpretation:  Sinus rhythm Probable left atrial enlargement Inferior infarct, old Probable anterior infarct, age indeterminate When compared with ECG of 05/06/2019 Nonspecific T wave abnormality is now Present Confirmed by Francine Graven [phone]) on 05/16/2019 11:23:30 PM   Radiology No results found.  Procedures Procedures (including critical care time)  Medications Ordered in ED Medications  potassium chloride 10 mEq in 100 mL IVPB (10 mEq  Intravenous New Bag/Given 05/16/19 2342)  potassium chloride SA (K-DUR) CR tablet 40 mEq (40 mEq Oral Given 05/16/19 2340)     Initial Impression / Assessment and Plan / ED Course  I have reviewed the triage vital signs and the nursing notes.  Pertinent labs & imaging results that were available during my care of the patient were reviewed by me and considered in my medical decision making (see chart for details).  Clinical Course as of May 16 50  Tue May 16, 2019  2342 Reassessed and is now back up. Pt to be given very soft fluid repletion given low EF of 20% in 2018.  BP(!): 85/45 [AM]  Wed May 17, 2019  0023 Spoke with Dr. Olevia Bowens who will admit pt. Appreciate his involvement.    [AM]    Clinical Course User Index [AM] Albesa Seen, PA-C       This is a 78 year old female with past medical history of hyperlipidemia, CAD status post remote PCI, dementia, failure to thrive, systolic heart failure presenting for failure to thrive, lack of p.o. intake.  Patient is confused and is not sure why she is here.  Lateral information from daughter reveals that patient is only smoking and drinking Sunkist for the past 2 weeks.  She also refuses medication.  Patient is very confused, but lack of p.o. intake does not appear to be an attempted self-harm.  Patient indicates that she does not want to eat because she just does not get very hungry.  Potassium returned low at 2.4.  She has U waves on EKG. Magnesium and phosphorus within normal limits.  Patient given p.o. and IV potassium.  Patient had some transient soft blood pressure readings that resolved.  She is clinically dry on exam.  Given soft fluid repletion in emergency department.  Patient admitted to [facility] hospitalist for failure to thrive and hypokalemia.  Appreciate their involvement.  Family  updated and in agreement on plan of care.  Final Clinical Impressions(s) / ED Diagnoses   Final diagnoses:  Hypokalemia  Failure to thrive in  adult    ED Discharge Orders    None           Delia ChimesMurray, Demetrice Amstutz B, PA-C 05/17/19 0057    Samuel JesterMcManus, Kathleen, DO 05/19/19 1540

## 2019-05-18 DIAGNOSIS — E876 Hypokalemia: Secondary | ICD-10-CM | POA: Diagnosis present

## 2019-05-18 DIAGNOSIS — Z716 Tobacco abuse counseling: Secondary | ICD-10-CM | POA: Diagnosis not present

## 2019-05-18 DIAGNOSIS — Z9049 Acquired absence of other specified parts of digestive tract: Secondary | ICD-10-CM | POA: Diagnosis not present

## 2019-05-18 DIAGNOSIS — Z803 Family history of malignant neoplasm of breast: Secondary | ICD-10-CM | POA: Diagnosis not present

## 2019-05-18 DIAGNOSIS — E785 Hyperlipidemia, unspecified: Secondary | ICD-10-CM | POA: Diagnosis present

## 2019-05-18 DIAGNOSIS — J439 Emphysema, unspecified: Secondary | ICD-10-CM | POA: Diagnosis present

## 2019-05-18 DIAGNOSIS — E0590 Thyrotoxicosis, unspecified without thyrotoxic crisis or storm: Secondary | ICD-10-CM | POA: Diagnosis not present

## 2019-05-18 DIAGNOSIS — I5042 Chronic combined systolic (congestive) and diastolic (congestive) heart failure: Secondary | ICD-10-CM | POA: Diagnosis present

## 2019-05-18 DIAGNOSIS — R627 Adult failure to thrive: Secondary | ICD-10-CM | POA: Diagnosis present

## 2019-05-18 DIAGNOSIS — F17210 Nicotine dependence, cigarettes, uncomplicated: Secondary | ICD-10-CM | POA: Diagnosis present

## 2019-05-18 DIAGNOSIS — Z66 Do not resuscitate: Secondary | ICD-10-CM | POA: Diagnosis not present

## 2019-05-18 DIAGNOSIS — Z7982 Long term (current) use of aspirin: Secondary | ICD-10-CM | POA: Diagnosis not present

## 2019-05-18 DIAGNOSIS — Z515 Encounter for palliative care: Secondary | ICD-10-CM | POA: Diagnosis not present

## 2019-05-18 DIAGNOSIS — E860 Dehydration: Secondary | ICD-10-CM | POA: Diagnosis present

## 2019-05-18 DIAGNOSIS — I2510 Atherosclerotic heart disease of native coronary artery without angina pectoris: Secondary | ICD-10-CM | POA: Diagnosis present

## 2019-05-18 DIAGNOSIS — F0391 Unspecified dementia with behavioral disturbance: Secondary | ICD-10-CM

## 2019-05-18 DIAGNOSIS — Z79899 Other long term (current) drug therapy: Secondary | ICD-10-CM | POA: Diagnosis not present

## 2019-05-18 DIAGNOSIS — Z833 Family history of diabetes mellitus: Secondary | ICD-10-CM | POA: Diagnosis not present

## 2019-05-18 DIAGNOSIS — I420 Dilated cardiomyopathy: Secondary | ICD-10-CM | POA: Diagnosis present

## 2019-05-18 DIAGNOSIS — Z20828 Contact with and (suspected) exposure to other viral communicable diseases: Secondary | ICD-10-CM | POA: Diagnosis present

## 2019-05-18 DIAGNOSIS — Z681 Body mass index (BMI) 19 or less, adult: Secondary | ICD-10-CM | POA: Diagnosis not present

## 2019-05-18 DIAGNOSIS — Z751 Person awaiting admission to adequate facility elsewhere: Secondary | ICD-10-CM | POA: Diagnosis not present

## 2019-05-18 DIAGNOSIS — F0390 Unspecified dementia without behavioral disturbance: Secondary | ICD-10-CM | POA: Diagnosis present

## 2019-05-18 DIAGNOSIS — Z7189 Other specified counseling: Secondary | ICD-10-CM | POA: Diagnosis not present

## 2019-05-18 DIAGNOSIS — I252 Old myocardial infarction: Secondary | ICD-10-CM | POA: Diagnosis not present

## 2019-05-18 DIAGNOSIS — E43 Unspecified severe protein-calorie malnutrition: Secondary | ICD-10-CM | POA: Diagnosis present

## 2019-05-18 DIAGNOSIS — J438 Other emphysema: Secondary | ICD-10-CM | POA: Diagnosis not present

## 2019-05-18 DIAGNOSIS — Z955 Presence of coronary angioplasty implant and graft: Secondary | ICD-10-CM | POA: Diagnosis not present

## 2019-05-18 DIAGNOSIS — Z8249 Family history of ischemic heart disease and other diseases of the circulatory system: Secondary | ICD-10-CM | POA: Diagnosis not present

## 2019-05-18 LAB — BASIC METABOLIC PANEL
Anion gap: 10 (ref 5–15)
BUN: 9 mg/dL (ref 8–23)
CO2: 23 mmol/L (ref 22–32)
Calcium: 8.3 mg/dL — ABNORMAL LOW (ref 8.9–10.3)
Chloride: 112 mmol/L — ABNORMAL HIGH (ref 98–111)
Creatinine, Ser: 0.74 mg/dL (ref 0.44–1.00)
GFR calc Af Amer: >60 mL/min (ref >60)
GFR calc non Af Amer: >60 mL/min (ref >60)
Glucose, Bld: 66 mg/dL — ABNORMAL LOW (ref 70–99)
Potassium: 4.1 mmol/L (ref 3.5–5.1)
Sodium: 145 mmol/L (ref 135–145)

## 2019-05-18 LAB — MAGNESIUM: Magnesium: 1.9 mg/dL (ref 1.7–2.4)

## 2019-05-18 LAB — T4, FREE: Free T4: 1.55 ng/dL — ABNORMAL HIGH (ref 0.61–1.12)

## 2019-05-18 LAB — VITAMIN B12: Vitamin B-12: 265 pg/mL (ref 180–914)

## 2019-05-18 LAB — FOLATE: Folate: 16.1 ng/mL (ref >5.9)

## 2019-05-18 LAB — TSH: TSH: 0.057 u[IU]/mL — ABNORMAL LOW (ref 0.350–4.500)

## 2019-05-18 MED ORDER — LORAZEPAM 2 MG/ML IJ SOLN
0.5000 mg | INTRAMUSCULAR | Status: AC | PRN
  Administered 2019-05-18 – 2019-05-19 (×2): 0.5 mg via INTRAVENOUS
  Filled 2019-05-18 (×3): qty 1

## 2019-05-18 NOTE — Evaluation (Signed)
Physical Therapy Evaluation Patient Details Name: Ruth Davidson MRN: [id number] DOB: 12-23-1940 Today's Date: 05/18/2019   History of Present Illness  Ruth Davidson is a 78 y.o. female with medical history significant of CAD, history of MI, cardiac cachexia, chronic diastolic CHF, COPD, tobacco use, dementia, medical treatment noncompliance, hypothyroidism who is coming to the emergency department brought by her daughter due to mild worsening in dementia and lack of appetite for the past 2 weeks.  The patient is unable to provide further history.    Clinical Impression  Patient agreeable for therapy and demonstrates good return for getting out/into bed, slightly unstable on feet, occasional veering left/right during ambulation with occasional leaning on near by objects for support, mild fall risk, limited for ambulation due to fatigue and tolerated sitting up in chair after therapy - nursing staff notified.  Patient will benefit from continued physical therapy in hospital and recommended venue below to increase strength, balance, endurance for safe ADLs and gait.    Follow Up Recommendations SNF;Supervision - Intermittent    Equipment Recommendations  None recommended by PT     Recommendations for Other Services       Precautions / Restrictions Precautions Precautions: Fall Restrictions Weight Bearing Restrictions: No      Mobility  Bed Mobility Overal bed mobility: Modified Independent             General bed mobility comments: increased time  Transfers Overall transfer level: Needs assistance Equipment used: None Transfers: Sit to/from Stand;Stand Pivot Transfers Sit to Stand: Supervision Stand pivot transfers: Supervision          Ambulation/Gait Ambulation/Gait assistance: Min guard Gait Distance (Feet): 75 Feet Assistive device: None Gait Pattern/deviations: Decreased step length - left;Decreased step length - right;Decreased stride length;Drifts  right/left Gait velocity: decreased   General Gait Details: slightly unsteady cadence with occasional veering left/right, no loss of balance, limited secondary to fatigue  Stairs            Wheelchair Mobility    Modified Rankin (Stroke Patients Only)       Balance   Sitting-balance support: Feet supported;No upper extremity supported Sitting balance-Leahy Scale: Good     Standing balance support: No upper extremity supported;During functional activity Standing balance-Leahy Scale: Fair Standing balance comment: fair/good                             Pertinent Vitals/Pain Pain Assessment: No/denies pain    Home Living Family/patient expects to be discharged to:: Private residence Living Arrangements: Children Available Help at Discharge: Family;Available PRN/intermittently Type of Home: House Home Access: Stairs to enter Entrance Stairs-Rails: None Entrance Stairs-Number of Steps: 2 Home Layout: One level Home Equipment: None      Prior Function Level of Independence: Independent         Comments: household and short distanced community ambulator     Hand Dominance   Dominant Hand: Right    Extremity/Trunk Assessment   Upper Extremity Assessment Upper Extremity Assessment: Generalized weakness    Lower Extremity Assessment Lower Extremity Assessment: Generalized weakness    Cervical / Trunk Assessment Cervical / Trunk Assessment: Normal  Communication   Communication: No difficulties  Cognition Arousal/Alertness: Awake/alert   Overall Cognitive Status: History of cognitive impairments - at baseline  General Comments: hx of dementia      General Comments      Exercises     Assessment/Plan    PT Assessment Patient needs continued PT services  PT Problem List Decreased strength;Decreased activity tolerance;Decreased balance;Decreased mobility       PT Treatment  Interventions Gait training;Stair training;Functional mobility training;Therapeutic activities;Therapeutic exercise;Patient/family education    PT Goals (Current goals can be found in the Care Plan section)  Acute Rehab PT Goals Patient Stated Goal: return home with family to assist PT Goal Formulation: With patient Time For Goal Achievement: 06/01/19 Potential to Achieve Goals: Good    Frequency Min 3X/week   Barriers to discharge        Co-evaluation               AM-PAC PT "6 Clicks" Mobility  Outcome Measure Help needed turning from your back to your side while in a flat bed without using bedrails?: None Help needed moving from lying on your back to sitting on the side of a flat bed without using bedrails?: None Help needed moving to and from a bed to a chair (including a wheelchair)?: None Help needed standing up from a chair using your arms (e.g., wheelchair or bedside chair)?: A Little Help needed to walk in hospital room?: A Little Help needed climbing 3-5 steps with a railing? : A Little 6 Click Score: 21    End of Session Equipment Utilized During Treatment: Gait belt Activity Tolerance: Patient tolerated treatment well;Patient limited by fatigue Patient left: in chair;with call bell/phone within reach;with bed alarm set Nurse Communication: Mobility status PT Visit Diagnosis: Unsteadiness on feet (R26.81);Other abnormalities of gait and mobility (R26.89);Muscle weakness (generalized) (M62.81)    Time: [id number] PT Time Calculation (min) (ACUTE ONLY): 24 min   Charges:   PT Evaluation $PT Eval Moderate Complexity: 1 Mod PT Treatments $Therapeutic Activity: 23-37 mins        3:04 PM, 05/18/19 Ocie BobJames Adarrius Graeff, MPT Physical Therapist [facility] [phone] office [phone] mobile phone

## 2019-05-18 NOTE — [place] FL2 (Signed)
[place] MEDICAID FL2 LEVEL OF CARE SCREENING TOOL     IDENTIFICATION  Patient Name: Ruth Davidson Birthdate: [birth year] Sex: female Admission Date (Current Location): 05/16/2019  [name] [place]   Facility and Address:  [name],  [address], [name]      Provider Number: [id number]  Attending Physician Name and Address:  Catarina Hartshornat, David, MD  Relative Name and Phone Number:  [name]    Current Level of Care: Other (Comment)(Observation) Recommended Level of Care: Skilled Nursing Facility, Memory Care Prior Approval Number:    Date Approved/Denied:   PASRR Number: [id number] [id number]  Discharge Plan: SNF    Current Diagnoses: Patient Active Problem List   Diagnosis Date Noted  . Hypokalemia 05/17/2019  . Protein-calorie malnutrition, severe 05/17/2019  . Severe protein-calorie malnutrition (HCC) 05/17/2019  . Palliative care by specialist   . Failure to thrive in adult   . Orthostatic hypotension 06/22/2017  . Chronic combined systolic and diastolic CHF (congestive heart failure) (HCC) 06/22/2017  . Goals of care, counseling/discussion   . Palliative care encounter   . Pre-syncope 06/21/2017  . Subdural hematoma (HCC) 06/21/2017  . Acute on chronic systolic CHF (congestive heart failure) (HCC) 08/28/2016  . Noncompliance 08/28/2016  . 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 Emphysema of lung (HCC) 06/11/2016  . 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 Dementia (HCC) 06/11/2016  . NSVT (nonsustained ventricular tachycardia) (HCC) 04/15/2016  . Acute systolic CHF (congestive heart failure) (HCC) 04/15/2016  . Community acquired pneumonia   . CAD S/P remote PCI 08/30/2013  . Hyperlipidemia 08/30/2013  . Tobacco abuse 08/30/2013    Orientation RESPIRATION BLADDER Height & Weight     Self  Normal Continent Weight: 44.5 kg Height:  5\' 3"  (160 cm)  BEHAVIORAL SYMPTOMS/MOOD NEUROLOGICAL BOWEL  NUTRITION STATUS      Continent Diet(Heart healthy)  AMBULATORY STATUS COMMUNICATION OF NEEDS Skin   Limited Assist Verbally Normal                       Personal Care Assistance Level of Assistance  Bathing, Feeding, Dressing Bathing Assistance: Limited assistance Feeding assistance: Limited assistance Dressing Assistance: Limited assistance     Functional Limitations Info  Sight, Speech, Hearing Sight Info: Adequate Hearing Info: Adequate Speech Info: Adequate    SPECIAL CARE FACTORS FREQUENCY  PT (By licensed PT)     PT Frequency: 5 times a week.              Contractures Contractures Info: Not present    Additional Factors Info  Code Status, Allergies Code Status Info: DNR Allergies Info: NKDA           Current Medications (05/18/2019):  This is the current hospital active medication list Current Facility-Administered Medications  Medication Dose Route Frequency Provider Last Rate Last Dose  . acetaminophen (TYLENOL) tablet 650 mg  650 mg Oral Q6H PRN Bobette Mortiz, David Manuel, MD       Or  . acetaminophen (TYLENOL) suppository 650 mg  650 mg Rectal Q6H PRN Bobette Mortiz, David Manuel, MD      . carvedilol (COREG) tablet 3.125 mg  3.125 mg Oral BID WC Catarina Hartshornat, David, MD   3.125 mg at 05/18/19 0824  . enoxaparin (LOVENOX) injection 30 mg  30 mg Subcutaneous Q24H Tat, Onalee Huaavid, MD   30 mg at 05/18/19 0824  . feeding supplement (ENSURE ENLIVE) (ENSURE ENLIVE) liquid 237 mL  237 mL Oral BID  BM Reubin Milan, MD   237 mL at 05/18/19 [phone]  . multivitamin with minerals tablet 1 tablet  1 tablet Oral Daily Tat, David, MD   1 tablet at 05/18/19 0824  . nicotine (NICODERM CQ - dosed in mg/24 hours) patch 14 mg  14 mg Transdermal Daily PRN Reubin Milan, MD      . prochlorperazine (COMPAZINE) injection 5 mg  5 mg Intravenous Q4H PRN Reubin Milan, MD         Discharge Medications: Please see discharge summary for a list of discharge medications.  Relevant Imaging  Results:  Relevant Lab Results:   Additional Information SS # [id number]  Boneta Lucks, RN

## 2019-05-18 NOTE — TOC Initial Note (Signed)
Transition of Care [facility]) - Initial/Assessment Note    Patient Details  Name: Ruth Davidson MRN: [id number] Date of Birth: May 07, 1941  Transition of Care [facility]) CM/SW Contact:    Boneta Lucks, RN Phone Number: 05/18/2019, 2:10 PM  Clinical Narrative:   Patient from home admitted with Hypokalemia.  Patient lives with her daughter Hurshel Keys, and has for 3 years.  Daughter is concerned that her mother will not eat. States she has only had a handful of food in 2 weeks, and sips of Sunkiss drink through out the day. Patient is ambulatory, has dementia.  Daughter would like long term care. Authorization started, FL2 done and referral sent to surrounding facilities.              Expected Discharge Plan: Skilled Nursing Facility Barriers to Discharge: Ship broker, Continued Medical Work up, SNF Pending bed offer   Patient Goals and CMS Choice   CMS Medicare.gov Compare Post Acute Care list provided to:: Patient Represenative (must comment)(Daughter - Hurshel Keys) Choice offered to / list presented to : Adult Children  Expected Discharge Plan and Services Expected Discharge Plan: [place]    Prior Living Arrangements/Services   Lives with:: Self, Adult Children Patient language and need for interpreter reviewed:: Yes        Need for Family Participation in Patient Care: Yes (Comment)     Criminal Activity/Legal Involvement Pertinent to Current Situation/Hospitalization: No - Comment as needed  Activities of Daily Living Home Assistive Devices/Equipment: None ADL Screening (condition at time of admission) Patient's cognitive ability adequate to safely complete daily activities?: Yes Is the patient deaf or have difficulty hearing?: No Does the patient have difficulty seeing, even when wearing glasses/contacts?: No Does the patient have difficulty concentrating, remembering, or making decisions?: Yes(h/o demntia) Patient able to express need for assistance with  ADLs?: No Does the patient have difficulty dressing or bathing?: Yes Independently performs ADLs?: Yes (appropriate for developmental age) Does the patient have difficulty walking or climbing stairs?: No Weakness of Legs: None Weakness of Arms/Hands: None  Permission Sought/Granted                  Emotional Assessment       Orientation: : Oriented to Self      Admission diagnosis:  Hypokalemia [E87.6] Failure to thrive in adult [R62.7] Patient Active Problem List   Diagnosis Date Noted  . Hypokalemia 05/17/2019  . Protein-calorie malnutrition, severe 05/17/2019  . Severe protein-calorie malnutrition ([place]) 05/17/2019  . Palliative care by specialist   . Failure to thrive in adult   . Orthostatic hypotension 06/22/2017  . Chronic combined systolic and diastolic CHF (congestive heart failure) ([place]) 06/22/2017  . Goals of care, counseling/discussion   . Palliative care encounter   . Pre-syncope 06/21/2017  . Subdural hematoma ([place]) 06/21/2017  . Acute on chronic systolic CHF (congestive heart failure) ([place]) 08/28/2016  . Noncompliance 08/28/2016  . Congestive dilated cardiomyopathy ([place]) 06/11/2016  . Emphysema of lung ([place]) 06/11/2016  . 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 Dementia ([place]) 06/11/2016  . NSVT (nonsustained ventricular tachycardia) ([place]) 04/15/2016  . Acute systolic CHF (congestive heart failure) ([place]) 04/15/2016  . Community acquired pneumonia   . CAD S/P remote PCI 08/30/2013  . Hyperlipidemia 08/30/2013  . Tobacco abuse 08/30/2013   PCP:  Rosita Fire, MD Pharmacy:   [place], [place] - [address] [place] [address] [place] [address] Phone: [phone] Fax: [phone]  Readmission Risk Interventions No flowsheet data found.

## 2019-05-18 NOTE — Plan of Care (Signed)
  Problem: Acute Rehab PT Goals(only PT should resolve) Goal: Pt Will Go Supine/Side To Sit Flowsheets (Taken 05/18/2019 1506) Pt will go Supine/Side to Sit: Independently Goal: Patient Will Perform Sitting Balance Flowsheets (Taken 05/18/2019 1506) Patient will perform sitting balance: with modified independence Goal: Patient Will Transfer Sit To/From Stand Flowsheets (Taken 05/18/2019 1506) Patient will transfer sit to/from stand: with modified independence Goal: Pt Will Ambulate Flowsheets (Taken 05/18/2019 1506) Pt will Ambulate:  > 125 feet  with modified independence  with supervision   3:07 PM, 05/18/19 Lonell Grandchild, MPT Physical Therapist with [facility] [phone] office [phone] mobile phone

## 2019-05-18 NOTE — Progress Notes (Signed)
PROGRESS NOTE  Ruth Davidson UJW:[id number] DOB: 04-Feb-1941 DOA: 05/16/2019 PCP: Avon GullyFanta, Tesfaye, MD  Brief History:  78 year old female with history of coronary artery disease status post remote PCI, systolic CHF EF 20% in 2018, tobacco abuse, COPD, dementia, hypothyroidism, and hyperlipidemia presenting with poor oral intake and increasing confusion.  Unfortunately, the patient is unable to provide any significant history.  The patient does not know why she is in the hospital.  The patient herself denies any fevers, chills, headache, chest pain, shortness breath, nausea, vomiting, diarrhea, abdominal pain.  According to patient's daughter, the patient has had very poor oral intake for the last 2 weeks and continues to smoke.  In addition, the patient has refused to take any medications for several months.  There are no reports of fevers, chills, chest pain, shortness breath, vomiting, diarrhea, abdominal pain.  Upon presentation, the patient was afebrile hemodynamically stable saturating 95% room air.  Potassium was noted to be 2.4 with serum creatinine 0.93.  Lactic acid was 1.1.  UA was negative for pyuria.  The patient was admitted for failure to thrive and dehydration.  Assessment/Plan: Failure to thrive -Check serum B12--265 -Folic acid--16.1 -TSH--0.057 -Free T4--1.55 -PT evaluation-->SNF -UA--no significant pyuria -pt continues to refuse to eat; has very poor po intake -unsafe discharge home--family unable to manage -awaiting SNF placement  Hyperthyroidism -check thyroid receptor antibodies (thyroid stimulating immunoglobulin)-->if normal-->obtain RAIU -TSH--0.057 -Free T4--1.55  Hypokalemia -Repleted -Magnesium 1.9  Chronic systolic and diastolic CHF -06/22/2017 echo EF 15-20%, diffuse HK, grade 1 DD, dilated IVC, trivial TR -Appears clinically dry -Daily weights  COPD -Stable on room air  Tobacco abuse -Tobacco cessation discussed -no desire to quit   Coronary artery disease -No chest pain presently -Personally reviewed EKG--sinus rhythm, nonspecific T wave change  Severe protein calorie malnutrition -Continue Ensure  Dementia without behavior disturbance -daughter relates progressive cognitive decline over last 6 months -at risk for hospital delirium  Hyperlipidemia -Continue statin  Goals of Care -palliative consult-->change to DNR after conversations with daughter and family    Disposition Plan:  SNF 7/17 if stable Family Communication:   Daughter updated on phone 7/15  Consultants:  palliative  Code Status:  FULL   DVT Prophylaxis:   [place] Lovenox   Procedures: As Listed in Progress Note Above  Antibiotics: None      Subjective: Patient denies fevers, chills, headache, chest pain, dyspnea, nausea, vomiting, diarrhea, abdominal pain, dysuria, hematuria, hematochezia, and melena.   Objective: Vitals:   05/17/19 1924 05/17/19 2117 05/18/19 0549 05/18/19 1447  BP:  122/60 124/60 (!) 109/48  Pulse:  95 (!) 104 76  Resp:  17 17 16   Temp:  98.1 F (36.7 C) 98.5 F (36.9 C) 98.4 F (36.9 C)  TempSrc:    Axillary  SpO2: 94% 94% 92% 94%  Weight:      Height:        Intake/Output Summary (Last 24 hours) at 05/18/2019 1706 Last data filed at 05/18/2019 0700 Gross per 24 hour  Intake 848.93 ml  Output 500 ml  Net 348.93 ml   Weight change:  Exam:   General:  Pt is alert, follows commands appropriately, not in acute distress  HEENT: No icterus, No thrush, No neck mass, [place]/AT  Cardiovascular: RRR, S1/S2, no rubs, no gallops  Respiratory: bibasilar rales, no wheeze  Abdomen: Soft/+BS, non tender, non distended, no guarding  Extremities: No edema, No lymphangitis, No petechiae, No rashes, no  synovitis   Data Reviewed: I have personally reviewed following labs and imaging studies Basic Metabolic Panel: Recent Labs  Lab 05/16/19 2232 05/17/19 1109 05/18/19 0539  NA 141 142 145   K 2.4* 3.8 4.1  CL 101 111 112*  CO2 30 25 23   GLUCOSE 118* 92 66*  BUN 9 8 9   CREATININE 0.93 0.72 0.74  CALCIUM 8.9 7.8* 8.3*  MG 1.9  --  1.9  PHOS 2.8  --   --    Liver Function Tests: Recent Labs  Lab 05/16/19 2232  AST 15  ALT 9  ALKPHOS 71  BILITOT 0.6  PROT 6.6  ALBUMIN 3.4*   No results for input(s): LIPASE, AMYLASE in the last 168 hours. No results for input(s): AMMONIA in the last 168 hours. Coagulation Profile: No results for input(s): INR, PROTIME in the last 168 hours. CBC: Recent Labs  Lab 05/16/19 2232  WBC 8.6  NEUTROABS 6.0  HGB 14.8  HCT 46.3*  MCV 95.9  PLT 206   Cardiac Enzymes: No results for input(s): CKTOTAL, CKMB, CKMBINDEX, TROPONINI in the last 168 hours. BNP: Invalid input(s): POCBNP CBG: Recent Labs  Lab 05/16/19 2307  GLUCAP 103*   HbA1C: No results for input(s): HGBA1C in the last 72 hours. Urine analysis:    Component Value Date/Time   COLORURINE YELLOW 05/17/2019 0020   APPEARANCEUR HAZY (A) 05/17/2019 0020   LABSPEC 1.013 05/17/2019 0020   PHURINE 5.0 05/17/2019 0020   GLUCOSEU NEGATIVE 05/17/2019 0020   HGBUR LARGE (A) 05/17/2019 0020   BILIRUBINUR NEGATIVE 05/17/2019 0020   KETONESUR NEGATIVE 05/17/2019 0020   PROTEINUR 30 (A) 05/17/2019 0020   NITRITE NEGATIVE 05/17/2019 0020   LEUKOCYTESUR TRACE (A) 05/17/2019 0020   Sepsis Labs: @LABRCNTIP (procalcitonin:4,lacticidven:4) ) Recent Results (from the past 240 hour(s))  SARS Coronavirus 2 (CEPHEID - Performed in [place] hospital lab), Hosp Order     Status: None   Collection Time: 05/17/19 12:15 AM   Specimen: Nasopharyngeal Swab  Result Value Ref Range Status   SARS Coronavirus 2 NEGATIVE NEGATIVE Final    Comment: (NOTE) If result is NEGATIVE SARS-CoV-2 target nucleic acids are NOT DETECTED. The SARS-CoV-2 RNA is generally detectable in upper and lower  respiratory specimens during the acute phase of infection. The lowest  concentration of SARS-CoV-2  viral copies this assay can detect is 250  copies / mL. A negative result does not preclude SARS-CoV-2 infection  and should not be used as the sole basis for treatment or other  patient management decisions.  A negative result may occur with  improper specimen collection / handling, submission of specimen other  than nasopharyngeal swab, presence of viral mutation(s) within the  areas targeted by this assay, and inadequate number of viral copies  (<250 copies / mL). A negative result must be combined with clinical  observations, patient history, and epidemiological information. If result is POSITIVE SARS-CoV-2 target nucleic acids are DETECTED. The SARS-CoV-2 RNA is generally detectable in upper and lower  respiratory specimens dur ing the acute phase of infection.  Positive  results are indicative of active infection with SARS-CoV-2.  Clinical  correlation with patient history and other diagnostic information is  necessary to determine patient infection status.  Positive results do  not rule out bacterial infection or co-infection with other viruses. If result is PRESUMPTIVE POSTIVE SARS-CoV-2 nucleic acids MAY BE PRESENT.   A presumptive positive result was obtained on the submitted specimen  and confirmed on repeat testing.  While  2019 novel coronavirus  (SARS-CoV-2) nucleic acids may be present in the submitted sample  additional confirmatory testing may be necessary for epidemiological  and / or clinical management purposes  to differentiate between  SARS-CoV-2 and other Sarbecovirus currently known to infect humans.  If clinically indicated additional testing with an alternate test  methodology [phone] is advised. The SARS-CoV-2 RNA is generally  detectable in upper and lower respiratory sp ecimens during the acute  phase of infection. The expected result is Negative. Fact Sheet for Patients:  BoilerBrush.com.cyhttps://www.fda.gov/media/136312/download Fact Sheet for Healthcare Providers:  https://pope.com/https://www.fda.gov/media/136313/download This test is not yet approved or cleared by the [place] FDA and has been authorized for detection and/or diagnosis of SARS-CoV-2 by FDA under an Emergency Use Authorization (EUA).  This EUA will remain in effect (meaning this test can be used) for the duration of the COVID-19 declaration under Section 564(b)(1) of the Act, 21 U.S.C. section 360bbb-3(b)(1), unless the authorization is terminated or revoked sooner. Performed at [facility], [address] [place], [place] [address]      Scheduled Meds: . carvedilol  3.125 mg Oral BID WC  . enoxaparin (LOVENOX) injection  30 mg Subcutaneous Q24H  . feeding supplement (ENSURE ENLIVE)  237 mL Oral BID BM  . multivitamin with minerals  1 tablet Oral Daily   Continuous Infusions:  Procedures/Studies: Dg Chest 2 View  Result Date: 05/06/2019 CLINICAL DATA:  Altered mental status.  Involuntary commitment. EXAM: CHEST - 2 VIEW COMPARISON:  Chest radiograph 06/21/2017 FINDINGS: Stable cardiac and mediastinal contours. Pulmonary hyperinflation. Emphysematous changes. No large area of pulmonary consolidation. No pleural effusion or pneumothorax. IMPRESSION: No acute cardiopulmonary process. Electronically Signed   By: Annia Beltrew  Davis M.D.   On: 05/06/2019 19:46    Catarina Hartshornavid Dajour [place], DO  [facility] Pager [phone]  If 7PM-7AM, please contact night-coverage [email or web address] Password [email or web address] 05/18/2019, 5:06 PM   LOS: 0 days

## 2019-05-18 NOTE — Progress Notes (Signed)
Patient became combative and would not stay in bed or the chair so I paged the doctor and he ordered a 1:1 sitter.

## 2019-05-18 NOTE — Plan of Care (Signed)
  Problem: Education: Goal: Knowledge of General Education information will improve Description Including pain rating scale, medication(s)/side effects and non-pharmacologic comfort measures Outcome: Progressing   Problem: Health Behavior/Discharge Planning: Goal: Ability to manage health-related needs will improve Outcome: Progressing   

## 2019-05-18 NOTE — Progress Notes (Signed)
Daily Progress Note   Patient Name: Ruth Davidson       Date: 05/18/2019 DOB: [birth year]  Age: 78 y.o. MRN#: [id number] Attending Physician: Orson Eva, MD Primary Care Physician: Rosita Fire, MD Admit Date: 05/16/2019  Reason for Consultation/Follow-up: Establishing goals of care  Subjective: Patient wakes to voice. Oriented to name but otherwise disoriented to time/place/situation. Attempted to reorient patient and offer her breakfast. Patient declined breakfast. She denies pain and does not appear to be in any discomfort.   GOC:  F/u GOC with patient's daughter Hurshel Keys) and son Dominica Severin) via telephone.  Hurshel Keys is documented legal guardian but it is important for her to include Dominica Severin in discussions and decision making.   Introduced Palliative Medicine as specialized medical care for people living with serious illness. It focuses on providing relief from the symptoms and stress of a serious illness. The goal is to improve quality of life for both the patient and the family.  Discussed events leading up to admission and course of hospitalization including diagnoses and interventions. Discussed disease trajectory of dementia and expectations of her irreversible condition, also with underlying heart failure and COPD.   I attempted to elicit values and goals of care important to the patient's children. Advanced directives, concepts specific to code status, artifical feeding and hydration, and rehospitalization were considered and discussed. Introduced and discussed MOST form. Nelda Bucks agree that they do not wish for heroic measures at EOL, including feeding tube or resuscitation. As [place] spoke of yesterday, she again reiterated her concern that resuscitation would only cause suffering and  break her mother's ribs. Agreed and explained medical recommendation against heroic interventions with underlying irreversible conditions including dementia, CHF, and COPD.   Hurshel Keys states "she's been through so much" and "we do not want her to suffer." Dominica Severin agrees.   Completed electronic and paper MOST form with Hurshel Keys and Dominica Severin. Family wishes include DNR/DNI, limited interventions including re-hospitalization and CPAP/BiPAP if indicated, IVF/ABX if indicated, and NO feeding tube. Reviewed and Hurshel Keys signed electronically. Durable DNR completed. Copies made for chart and copies placed in patient belonging bag with Hard Choices booklet.   In conclusion, we discussed her nutritional status contributing to long-term prognosis. Hurshel Keys and Dominica Severin understand that her body will continue to shut down if she refuses to eat/drink or poorly eating/drinking.  Dondra PraderFondra and Jillyn HiddenGary are hopeful to get their mother into a nursing home. They cannot care for her anymore at home and she frequently refuses food/medications/bathing at home. Explained that SW has been consulted and will be in touch with [place].   Also recommended [name] drop off a copy of legal guardianship paperwork for Epic documentation. Dondra PraderFondra works at [employer] and plans to drop off paperwork.   Questions and concerns were addressed. PMT contact information given.    Length of Stay: 0  Current Medications: Scheduled Meds:   carvedilol  3.125 mg Oral BID WC   enoxaparin (LOVENOX) injection  30 mg Subcutaneous Q24H   feeding supplement (ENSURE ENLIVE)  237 mL Oral BID BM   multivitamin with minerals  1 tablet Oral Daily    Continuous Infusions:   PRN Meds: acetaminophen **OR** acetaminophen, LORazepam, nicotine, prochlorperazine  Physical Exam Vitals signs and nursing note reviewed.  Constitutional:      General: She is awake.     Appearance: She is cachectic.  HENT:     Head: Normocephalic and atraumatic.  Pulmonary:     Effort: No  tachypnea, accessory muscle usage or respiratory distress.  Abdominal:     Tenderness: There is no abdominal tenderness.  Skin:    General: Skin is warm and dry.  Neurological:     Mental Status: She is alert.     Comments: Oriented to person, disoriented to time/place/situation.  Psychiatric:        Mood and Affect: Mood normal.        Speech: Speech normal.        Cognition and Memory: Cognition is impaired.             Vital Signs: BP 124/60 (BP Location: Left Arm)    Pulse (!) 104    Temp 98.5 F (36.9 C)    Resp 17    Ht 5\' 3"  (1.6 m)    Wt 44.5 kg    SpO2 92%    BMI 17.38 kg/m  SpO2: SpO2: 92 % O2 Device: O2 Device: Room Air O2 Flow Rate:    Intake/output summary:   Intake/Output Summary (Last 24 hours) at 05/18/2019 1054 Last data filed at 05/18/2019 0700 Gross per 24 hour  Intake 848.93 ml  Output 500 ml  Net 348.93 ml   LBM: Last BM Date: 05/16/19 Baseline Weight: Weight: 44.5 kg Most recent weight: Weight: 44.5 kg       Palliative Assessment/Data: PPS 40%    Flowsheet Rows     Most Recent Value  Intake Tab  Referral Department  Hospitalist  Unit at Time of Referral  Med/Surg Unit  Palliative Care Primary Diagnosis  Neurology  Palliative Care Type  New Palliative care  Reason for referral  Clarify Goals of Care  Date first seen by Palliative Care  05/17/19  Clinical Assessment  Palliative Performance Scale Score  40%  Psychosocial & Spiritual Assessment  Palliative Care Outcomes  Patient/Family meeting held?  Yes  Who was at the meeting?  daughter  Palliative Care Outcomes  Clarified goals of care, Provided psychosocial or spiritual support, ACP counseling assistance      Patient Active Problem List   Diagnosis Date Noted   Hypokalemia 05/17/2019   Protein-calorie malnutrition, severe 05/17/2019   Severe protein-calorie malnutrition (HCC) 05/17/2019   Palliative care by specialist    Failure to thrive in adult    Orthostatic hypotension  06/22/2017   Chronic combined systolic and diastolic CHF (congestive heart failure) (  HCC) 06/22/2017   Goals of care, counseling/discussion    Palliative care encounter    Pre-syncope 06/21/2017   Subdural hematoma (HCC) 06/21/2017   Acute on chronic systolic CHF (congestive heart failure) (HCC) 08/28/2016   Noncompliance 08/28/2016   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 In ED, potassium of 2.4. Received potassium supplements and IVF initiated. Home medications continued for CAD and CHF. Palliative medicine consultation for goals of care  Assessment: Dementia Adult failure to thrive Severe protein calorie malnutrition Hypokalemia Chronic systolic and diastolic CHF COPD Tobacco abuse  Recommendations/Plan:  F/u GOC with patient's daughter and son Dondra Prader([name] and Jillyn HiddenGary). Dondra PraderFondra is documented legal guardian. Paperwork requested for chart.  Electronic MOST form completed. DNR/DNI, limited interventions including rehospitalization/CPAP/BiPAP if indicated, IVF/ABX if indicated, NO feeding tube. Paper MOST form also completed. Durable DNR completed and copies made for chart & patient belonging bag.   SW consulted to assist with nursing home placement. Daughter cannot care for her at home.   May benefit from outpatient palliative services but  [place]    Code Status: DNR  Code Status Orders  (From admission, onward)         Start     Ordered   05/17/19 0116  Full code  Continuous     05/17/19 0116        Code Status History    Date Active Date Inactive Code Status Order ID Comments User Context   05/06/2019 1827 05/07/2019 1429 Full Code [id number]  Aura DialsHenderly, Britni A, PA-C ED   06/21/2017 1852 06/22/2017 1923 Full Code [id number]  Floydene FlockNewton, Steven J, MD ED   08/28/2016 2107 08/31/2016 1548 Full Code [id number]  Levie HeritageStinson, Jacob J, DO Inpatient   04/14/2016 1944 04/18/2016 1625 Full Code [id number]  Briscoe Deutscherpyd, Timothy S, MD ED   Advance Care Planning Activity    Advance Directive Documentation     Most Recent Value  Type of Advance Directive  Healthcare Power of Attorney  Pre-existing out of facility DNR order (yellow form or pink MOST form)  --  "MOST" Form in Place?  --       Prognosis:   Unable to determine  Discharge Planning:  To Be Determined  Care plan was discussed with Dr Arbutus Leasat, daughter/son, RN  Thank you for allowing the Palliative Medicine Team to assist in the care of this patient.   Time In: 1100 Time Out: 1145 Total Time 45 Prolonged Time Billed  no      Greater than 50%  of this time was spent counseling and coordinating care related to the above assessment and plan.  Vennie HomansMegan Siearra Amberg, FNP-C Palliative Medicine Team  Phone: [phone] Fax: [phone]  Please contact Palliative Medicine Team phone at [phone] for questions and concerns.

## 2019-05-19 DIAGNOSIS — J439 Emphysema, unspecified: Secondary | ICD-10-CM

## 2019-05-19 LAB — BASIC METABOLIC PANEL
Anion gap: 9 (ref 5–15)
BUN: 10 mg/dL (ref 8–23)
CO2: 23 mmol/L (ref 22–32)
Calcium: 8.3 mg/dL — ABNORMAL LOW (ref 8.9–10.3)
Chloride: 109 mmol/L (ref 98–111)
Creatinine, Ser: 0.71 mg/dL (ref 0.44–1.00)
GFR calc Af Amer: >60 mL/min (ref >60)
GFR calc non Af Amer: >60 mL/min (ref >60)
Glucose, Bld: 72 mg/dL (ref 70–99)
Potassium: 4 mmol/L (ref 3.5–5.1)
Sodium: 141 mmol/L (ref 135–145)

## 2019-05-19 LAB — MAGNESIUM: Magnesium: 2 mg/dL (ref 1.7–2.4)

## 2019-05-19 MED ORDER — ENSURE ENLIVE PO LIQD: 237.0000 mL | Freq: Two times a day (BID) | ORAL | 12 refills | Status: DC

## 2019-05-19 MED ORDER — LORAZEPAM 2 MG/ML IJ SOLN
0.5000 mg | INTRAMUSCULAR | Status: DC | PRN
  Administered 2019-05-19 – 2019-05-29 (×20): 0.5 mg via INTRAVENOUS
  Filled 2019-05-19 (×20): qty 1

## 2019-05-19 NOTE — Care Management Important Message (Signed)
Important Message  Patient Details  Name: Ruth Davidson MRN: [id number] Date of Birth: 17-Nov-1940   Medicare Important Message Given:  Yes     Tommy Medal 05/19/2019, 12:11 PM

## 2019-05-19 NOTE — Plan of Care (Signed)
  Problem: Education: Goal: Knowledge of General Education information will improve Description Including pain rating scale, medication(s)/side effects and non-pharmacologic comfort measures Outcome: Progressing   Problem: Health Behavior/Discharge Planning: Goal: Ability to manage health-related needs will improve Outcome: Progressing   

## 2019-05-19 NOTE — TOC Progression Note (Signed)
Transition of Care [facility]) - Progression Note    Patient Details  Name: Ruth Davidson MRN: [id number] Date of Birth: 1941/05/16  Transition of Care [facility]) CM/SW Contact  Boneta Lucks, RN Phone Number: 05/19/2019, 3:16 PM  Clinical Narrative:   Patient is medically stable and ready for discharge. Daughter has requested an appeal with [employer]. States is will take 2 to 3 business days.  Again offer daughter [facility] with  RN/PT/SW and they could continue to work on placement. Daughter states she has to work the weekend and her niece, the caregiver is now out of town for the weekend. Spoke with our team leader and MD.  We will not discharge the  patient over the weekend due to the appeal and safely of patient.  Lynnae Sandhoff at [facility] is working on making a bed offer, but it will be Monday at the earliest. Oneta Rack called back to confirm Patient has a payable at death benefit that he can convert to use for placement as a cash option. He can not get the money immediately but will start working on that.  Daughter has been updated.      Expected Discharge Plan: Skilled Nursing Facility Barriers to Discharge: Ship broker, Continued Medical Work up, SNF Pending bed offer  Expected Discharge Plan and Services Expected Discharge Plan: [place]         Expected Discharge Date: 05/19/19

## 2019-05-19 NOTE — Discharge Summary (Addendum)
Physician Discharge Summary  Berneta SagesJudy M Hobson [id number] DOB: 01-28-1941 DOA: 05/16/2019  PCP: Avon GullyFanta, Tesfaye, MD  Admit date: 05/16/2019 Discharge date: 05/19/2019  Admitted From: Home Disposition:  Home   Recommendations for Outpatient Follow-up:  1. Follow up with PCP in 1-2 weeks 2. Please obtain BMP/CBC in one week   Home Health: YES Equipment/Devices: HHRN, HHPT, SW  Discharge Condition: Stable CODE STATUS: DNR Diet recommendation: Heart Healthy / Carb Modified   Brief/Interim Summary: 78 year old female with history of coronary artery disease status post remote PCI, systolic CHF EF 20% in 2018, tobacco abuse, COPD, dementia, hypothyroidism, and hyperlipidemia presenting with poor oral intake and increasing confusion.Unfortunately, the patient is unable to provide any significant history. The patient does not know why she is in the hospital. The patient herself denies any fevers, chills, headache, chest pain, shortness breath, nausea, vomiting, diarrhea, abdominal pain. According to patient's daughter, the patient has had very poor oral intake for the last 2 weeks and continues to smoke. In addition, the patient has refused to take any medications for several months. There are no reports of fevers, chills, chest pain, shortness breath, vomiting, diarrhea, abdominal pain. Upon presentation, the patient was afebrile hemodynamically stable saturating 95% room air. Potassium was noted to be 2.4 with serum creatinine 0.93. Lactic acid was 1.1. UA was negative for pyuria. The patient was admitted for failure to thrive and dehydration.  The patient was placed on IV fluids and her potassium was repleted.  Her renal function improved and electrolytes were optimized.  Physical therapy was consulted and recommended 24-hour supervision and possible skilled nursing facility.  Unfortunately, the patient continued to have poor oral intake.  The patient's daughter was updated throughout  the hospitalization.  Unfortunately, the patient's insurance company denied placement into a skilled nursing facility.  I subsequently had a peer-to-peer discussion with the patient's insurance company representative.  Unfortunately, authorization could not be obtained despite the peer to peer review.  Subsequently discussed with the patient's daughter (on 05/19/19) after the peer to peer review and updated her on the situation.  I told the patient's daughter that the patient is medically stable.  I offered home health physical therapy, RN, and social work with the assistance of our case Production designer, theatre/television/filmmanager. Work-up during the hospitalization did not reveal any reversible causes of the patient's intermittent agitation.  Therefore, this was likely attributed to the patient's continued cognitive decline from her dementia.  It was noted from blood work that the patient had hyperthyroidism.  Thyroid-stimulating antibodies were ordered and are pending at the time of discharge.  Palliative medicine was consulted.  After discussion with the patient's family, the patient was transitioned to DNR status.  Discharge Diagnoses:  Failure to thrive -Check serum B12--265 -Folic acid--16.1 -TSH--0.057 -Free T4--1.55 -PT evaluation-->SNF -UA--no significant pyuria -intermittently refuse to eat; has poor po intake -awaiting SNF placement--denied by insurance company despite peer to peer review/discussion on 05/19/19 -set up HHPT, HHRN, HHSW  Hyperthyroidism -check thyroid receptor antibodies (thyroid stimulating immunoglobulin)-->if normal-->obtain RAIU -TSH--0.057 -Free T4--1.55  Hypokalemia -Repleted -Magnesium 1.9  Chronic systolic and diastolic CHF -06/22/2017 echo EF 15-20%, diffuse HK, grade 1 DD, dilated IVC, trivial TR -Appears clinically dry -Daily weights--stable -restart lasix after discharge  COPD -Stable on room air  Tobacco abuse -Tobacco cessation discussed -no desire to quit  Coronary  artery disease -No chest pain presently -Personally reviewed EKG--sinus rhythm, nonspecific T wave change  Severe protein calorie malnutrition -Continue Ensure  Dementia without behavior disturbance -daughter relates  progressive cognitive decline over last 6 months -at risk for hospital delirium  Hyperlipidemia -Continue statin  Goals of Care -palliative consult-->change to DNR after conversations with daughter and family     Discharge Instructions   Allergies as of 05/19/2019   No Known Allergies     Medication List    TAKE these medications   aspirin 81 MG chewable tablet Chew 1 tablet (81 mg total) by mouth daily. Restart on 06/28/17   carvedilol 3.125 MG tablet Commonly known as: COREG TAKE 1 TABLET BY MOUTH TWICE DAILY WITH A MEAL   feeding supplement (ENSURE ENLIVE) Liqd Take 237 mLs by mouth 2 (two) times daily between meals.   furosemide 40 MG tablet Commonly known as: LASIX TAKE ONE TABLET BY MOUTH ONCE DAILY   nitroGLYCERIN 0.4 MG SL tablet Commonly known as: NITROSTAT Place 1 tablet (0.4 mg total) under the tongue every 5 (five) minutes as needed for chest pain (or back pain or arm pain).   potassium chloride SA 20 MEQ tablet Commonly known as: K-DUR TAKE 1 TABLET BY MOUTH ONCE DAILY   simvastatin 80 MG tablet Commonly known as: ZOCOR TAKE ONE TABLET BY MOUTH ONCE DAILY AT BEDTIME       No Known Allergies  Consultations:  Palliative medicine   Procedures/Studies: Dg Chest 2 View  Result Date: 05/06/2019 CLINICAL DATA:  Altered mental status.  Involuntary commitment. EXAM: CHEST - 2 VIEW COMPARISON:  Chest radiograph 06/21/2017 FINDINGS: Stable cardiac and mediastinal contours. Pulmonary hyperinflation. Emphysematous changes. No large area of pulmonary consolidation. No pleural effusion or pneumothorax. IMPRESSION: No acute cardiopulmonary process. Electronically Signed   By: Lovey Newcomer M.D.   On: 05/06/2019 19:46         Discharge Exam: Vitals:   05/18/19 2119 05/19/19 0546  BP: 117/69 108/64  Pulse: 80 85  Resp: 18 17  Temp: 98.2 F (36.8 C) 98.1 F (36.7 C)  SpO2: 95% 94%   Vitals:   05/18/19 1447 05/18/19 1951 05/18/19 2119 05/19/19 0546  BP: (!) 109/48  117/69 108/64  Pulse: 76  80 85  Resp: 16  18 17   Temp: 98.4 F (36.9 C)  98.2 F (36.8 C) 98.1 F (36.7 C)  TempSrc: Axillary   Oral  SpO2: 94% 92% 95% 94%  Weight:      Height:        General: Pt is alert, awake, not in acute distress Cardiovascular: RRR, S1/S2 +, no rubs, no gallops Respiratory: diminished BS bilaterally, no wheezing, no rhonchi Abdominal: Soft, NT, ND, bowel sounds + Extremities: no edema, no cyanosis   The results of significant diagnostics from this hospitalization (including imaging, microbiology, ancillary and laboratory) are listed below for reference.    Significant Diagnostic Studies: Dg Chest 2 View  Result Date: 05/06/2019 CLINICAL DATA:  Altered mental status.  Involuntary commitment. EXAM: CHEST - 2 VIEW COMPARISON:  Chest radiograph 06/21/2017 FINDINGS: Stable cardiac and mediastinal contours. Pulmonary hyperinflation. Emphysematous changes. No large area of pulmonary consolidation. No pleural effusion or pneumothorax. IMPRESSION: No acute cardiopulmonary process. Electronically Signed   By: Lovey Newcomer M.D.   On: 05/06/2019 19:46     Microbiology: Recent Results (from the past 240 hour(s))  SARS Coronavirus 2 (CEPHEID - Performed in [facility] hospital lab), Hosp Order     Status: None   Collection Time: 05/17/19 12:15 AM   Specimen: Nasopharyngeal Swab  Result Value Ref Range Status   SARS Coronavirus 2 NEGATIVE NEGATIVE Final    Comment: (  NOTE) If result is NEGATIVE SARS-CoV-2 target nucleic acids are NOT DETECTED. The SARS-CoV-2 RNA is generally detectable in upper and lower  respiratory specimens during the acute phase of infection. The lowest  concentration of SARS-CoV-2  viral copies this assay can detect is 250  copies / mL. A negative result does not preclude SARS-CoV-2 infection  and should not be used as the sole basis for treatment or other  patient management decisions.  A negative result may occur with  improper specimen collection / handling, submission of specimen other  than nasopharyngeal swab, presence of viral mutation(s) within the  areas targeted by this assay, and inadequate number of viral copies  (<250 copies / mL). A negative result must be combined with clinical  observations, patient history, and epidemiological information. If result is POSITIVE SARS-CoV-2 target nucleic acids are DETECTED. The SARS-CoV-2 RNA is generally detectable in upper and lower  respiratory specimens dur ing the acute phase of infection.  Positive  results are indicative of active infection with SARS-CoV-2.  Clinical  correlation with patient history and other diagnostic information is  necessary to determine patient infection status.  Positive results do  not rule out bacterial infection or co-infection with other viruses. If result is PRESUMPTIVE POSTIVE SARS-CoV-2 nucleic acids MAY BE PRESENT.   A presumptive positive result was obtained on the submitted specimen  and confirmed on repeat testing.  While 2019 novel coronavirus  (SARS-CoV-2) nucleic acids may be present in the submitted sample  additional confirmatory testing may be necessary for epidemiological  and / or clinical management purposes  to differentiate between  SARS-CoV-2 and other Sarbecovirus currently known to infect humans.  If clinically indicated additional testing with an alternate test  methodology [phone]([id number]) is advised. The SARS-CoV-2 RNA is generally  detectable in upper and lower respiratory sp ecimens during the acute  phase of infection. The expected result is Negative. Fact Sheet for Patients:  BoilerBrush.com.cyhttps://www.fda.gov/media/136312/download Fact Sheet for Healthcare  Providers: https://pope.com/https://www.fda.gov/media/136313/download This test is not yet approved or cleared by the [place] FDA and has been authorized for detection and/or diagnosis of SARS-CoV-2 by FDA under an Emergency Use Authorization (EUA).  This EUA will remain in effect (meaning this test can be used) for the duration of the COVID-19 declaration under Section 564(b)(1) of the Act, 21 U.S.C. section 360bbb-3(b)(1), unless the authorization is terminated or revoked sooner. Performed at [facility], [address] [place], [place] [address]      Labs: Basic Metabolic Panel: Recent Labs  Lab 05/16/19 2232 05/17/19 1109 05/18/19 0539 05/19/19 0459  NA 141 142 145 141  K 2.4* 3.8 4.1 4.0  CL 101 111 112* 109  CO2 30 25 23 23   GLUCOSE 118* 92 66* 72  BUN 9 8 9 10   CREATININE 0.93 0.72 0.74 0.71  CALCIUM 8.9 7.8* 8.3* 8.3*  MG 1.9  --  1.9 2.0  PHOS 2.8  --   --   --    Liver Function Tests: Recent Labs  Lab 05/16/19 2232  AST 15  ALT 9  ALKPHOS 71  BILITOT 0.6  PROT 6.6  ALBUMIN 3.4*   No results for input(s): LIPASE, AMYLASE in the last 168 hours. No results for input(s): AMMONIA in the last 168 hours. CBC: Recent Labs  Lab 05/16/19 2232  WBC 8.6  NEUTROABS 6.0  HGB 14.8  HCT 46.3*  MCV 95.9  PLT 206   Cardiac Enzymes: No results for input(s): CKTOTAL, CKMB, CKMBINDEX, TROPONINI in the last  168 hours. BNP: Invalid input(s): POCBNP CBG: Recent Labs  Lab 05/16/19 2307  GLUCAP 103*    Time coordinating discharge:  36 minutes  Signed:  Catarina Hartshornavid Margurite Duffy, DO [facility] Pager: [phone] 05/19/2019, 2:17 PM

## 2019-05-19 NOTE — TOC Progression Note (Signed)
Transition of Care [facility]) - Progression Note    Patient Details  Name: Ruth Davidson MRN: [id number] Date of Birth: [birth year]  [facility]) CM/SW Contact  Boneta Lucks, RN Phone Number: 05/19/2019, 10:41 AM  Clinical Narrative:   Insurance refused authorization with peer to peer.  Hurshel Keys, daughter updated, Home health offered, she states her mother has $18,000.00 in the bank and they will pay cash for placement and file for Medicaid.  Daughter works at [employer], she spoke to them, but they do not have memory care unit. Hurshel Keys gave CM the attorneys info to contact about the release of her money for payment.  Left a message -  Jayme Cloud- attorney [phone]. TOC is calling facilities  to review her case.      Expected Discharge Plan: Skilled Nursing Facility Barriers to Discharge: Ship broker, Continued Medical Work up, SNF Pending bed offer  Expected Discharge Plan and Services Expected Discharge Plan: [place]

## 2019-05-20 MED ORDER — LORAZEPAM 0.5 MG PO TABS
0.5000 mg | ORAL_TABLET | Freq: Once | ORAL | Status: DC
  Filled 2019-05-20 (×4): qty 1

## 2019-05-20 NOTE — Progress Notes (Signed)
PROGRESS NOTE  Ruth Davidson ZOX:[id number] DOB: 1941-07-30 DOA: 05/16/2019 PCP: Avon GullyFanta, Tesfaye, MD  Brief History:  78 year old female with history of coronary artery disease status post remote PCI, systolic CHF EF 20% in 2018, tobacco abuse, COPD, dementia, hypothyroidism, and hyperlipidemia presenting with poor oral intake and increasing confusion.Unfortunately, the patient is unable to provide any significant history. The patient does not know why she is in the hospital. The patient herself denies any fevers, chills, headache, chest pain, shortness breath, nausea, vomiting, diarrhea, abdominal pain. According to patient's daughter, the patient has had very poor oral intake for the last 2 weeks and continues to smoke. In addition, the patient has refused to take any medications for several months. There are no reports of fevers, chills, chest pain, shortness breath, vomiting, diarrhea, abdominal pain. Upon presentation, the patient was afebrile hemodynamically stable saturating 95% room air. Potassium was noted to be 2.4 with serum creatinine 0.93. Lactic acid was 1.1. UA was negative for pyuria. The patient was admitted for failure to thrive and dehydration.  The patient was placed on IV fluids and her potassium was repleted.  Her renal function improved and electrolytes were optimized.  Physical therapy was consulted and recommended 24-hour supervision and possible skilled nursing facility.  Unfortunately, the patient continued to have poor oral intake.  The patient's daughter was updated throughout the hospitalization.  Unfortunately, the patient's insurance company denied placement into a skilled nursing facility.  I subsequently had a peer-to-peer discussion with the patient's insurance company representative.  Unfortunately, authorization could not be obtained despite the peer to peer review.  Subsequently discussed with the patient's daughter (on 05/19/19) after the peer to peer  review and updated her on the situation.  I told the patient's daughter that the patient is medically stable.  I offered home health physical therapy, RN, and social work with the assistance of our case Production designer, theatre/television/filmmanager. Daughter appealed insurance denial, and she refused to take patient home.  Therefore, the patient remained in the hospital until the situation was worked out.  APS referral was sent.  Work-up during the hospitalization did not reveal any reversible causes of the patient's intermittent agitation.  Therefore, this was likely attributed to the patient's continued cognitive decline from her dementia.  It was noted from blood work that the patient had hyperthyroidism.  Thyroid-stimulating antibodies were ordered and are pending at the time of discharge.  Palliative medicine was consulted.  After discussion with the patient's family, the patient was transitioned to DNR status.   Assessment/Plan: Failure to thrive -Check serum B12--265 -Folic acid--16.1 -TSH--0.057 -Free T4--1.55 -PT evaluation-->SNF -UA--no significant pyuria -intermittently refuse to eat; has poor po intake -awaiting SNF placement--denied by insurance company despite peer to peer review/discussion on 05/19/19 -set up HHPT, HHRN, HHSW -daughter refuses to take patient home; appealing insurance denial.  Hyperthyroidism -check thyroid receptor antibodies (thyroid stimulating immunoglobulin)-->if normal-->obtain RAIU -TSH--0.057 -Free T4--1.55  Hypokalemia -Repleted -Magnesium 1.9  Chronic systolic and diastolic CHF -06/22/2017 echo EF 15-20%, diffuse HK, grade 1 DD, dilated IVC, trivial TR -Appears clinically dry -Daily weights--stable -restart lasix after discharge  COPD -Stable on room air  Tobacco abuse -Tobacco cessation discussed -no desire to quit  Coronary artery disease -No chest pain presently -Personally reviewed EKG--sinus rhythm, nonspecific T wave change  Severe protein calorie  malnutrition -Continue Ensure  Dementia without behavior disturbance -daughter relates progressive cognitive decline over last 6 months -at risk for hospital delirium  Hyperlipidemia -Continue statin  Goals of Care -palliative consult-->change to DNR after conversations with daughter and family        Disposition Plan:   Home vs SNF pending appeal of insurance auth Family Communication:   Daughter updated on 7/18 on phone  Consultants:  none  Code Status:  DNR  DVT Prophylaxis:   [place] Lovenox   Procedures: As Listed in Progress Note Above  Antibiotics: None       Subjective:  Pt is pleasantly confused.  She is intermittently impulsive and not follow directions.  Denies f/c, cp, sob, abd pain, n/v. Objective: Vitals:   05/18/19 2119 05/19/19 0546 05/19/19 2116 05/20/19 0524  BP: 117/69 108/64 (!) 116/45 (!) 107/52  Pulse: 80 85 88 87  Resp: 18 17 16 18   Temp: 98.2 F (36.8 C) 98.1 F (36.7 C) 98.3 F (36.8 C) 98.7 F (37.1 C)  TempSrc:  Oral Axillary Oral  SpO2: 95% 94% 94% 91%  Weight:      Height:        Intake/Output Summary (Last 24 hours) at 05/20/2019 1625 Last data filed at 05/20/2019 1300 Gross per 24 hour  Intake 120 ml  Output 100 ml  Net 20 ml   Weight change:  Exam:   General:  Pt is alert,not in acute distress  HEENT: No icterus, No thrush, No neck mass, [place]/AT  Cardiovascular: RRR, S1/S2, no rubs, no gallops  Respiratory: diminished breath sounds bilateral.    Abdomen: Soft/+BS, non tender, non distended, no guarding  Extremities: No edema, No lymphangitis, No petechiae, No rashes, no synovitis   Data Reviewed: I have personally reviewed following labs and imaging studies Basic Metabolic Panel: Recent Labs  Lab 05/16/19 2232 05/17/19 1109 05/18/19 0539 05/19/19 0459  NA 141 142 145 141  K 2.4* 3.8 4.1 4.0  CL 101 111 112* 109  CO2 30 25 23 23   GLUCOSE 118* 92 66* 72  BUN 9 8 9 10   CREATININE 0.93 0.72 0.74  0.71  CALCIUM 8.9 7.8* 8.3* 8.3*  MG 1.9  --  1.9 2.0  PHOS 2.8  --   --   --    Liver Function Tests: Recent Labs  Lab 05/16/19 2232  AST 15  ALT 9  ALKPHOS 71  BILITOT 0.6  PROT 6.6  ALBUMIN 3.4*   No results for input(s): LIPASE, AMYLASE in the last 168 hours. No results for input(s): AMMONIA in the last 168 hours. Coagulation Profile: No results for input(s): INR, PROTIME in the last 168 hours. CBC: Recent Labs  Lab 05/16/19 2232  WBC 8.6  NEUTROABS 6.0  HGB 14.8  HCT 46.3*  MCV 95.9  PLT 206   Cardiac Enzymes: No results for input(s): CKTOTAL, CKMB, CKMBINDEX, TROPONINI in the last 168 hours. BNP: Invalid input(s): POCBNP CBG: Recent Labs  Lab 05/16/19 2307  GLUCAP 103*   HbA1C: No results for input(s): HGBA1C in the last 72 hours. Urine analysis:    Component Value Date/Time   COLORURINE YELLOW 05/17/2019 0020   APPEARANCEUR HAZY (A) 05/17/2019 0020   LABSPEC 1.013 05/17/2019 0020   PHURINE 5.0 05/17/2019 0020   GLUCOSEU NEGATIVE 05/17/2019 0020   HGBUR LARGE (A) 05/17/2019 0020   BILIRUBINUR NEGATIVE 05/17/2019 0020   KETONESUR NEGATIVE 05/17/2019 0020   PROTEINUR 30 (A) 05/17/2019 0020   NITRITE NEGATIVE 05/17/2019 0020   LEUKOCYTESUR TRACE (A) 05/17/2019 0020   Sepsis Labs: @LABRCNTIP (procalcitonin:4,lacticidven:4) ) Recent Results (from the past 240 hour(s))  SARS Coronavirus 2 (CEPHEID -  Performed in [facility] hospital lab), Hosp Order     Status: None   Collection Time: 05/17/19 12:15 AM   Specimen: Nasopharyngeal Swab  Result Value Ref Range Status   SARS Coronavirus 2 NEGATIVE NEGATIVE Final    Comment: (NOTE) If result is NEGATIVE SARS-CoV-2 target nucleic acids are NOT DETECTED. The SARS-CoV-2 RNA is generally detectable in upper and lower  respiratory specimens during the acute phase of infection. The lowest  concentration of SARS-CoV-2 viral copies this assay can detect is 250  copies / mL. A negative result does not  preclude SARS-CoV-2 infection  and should not be used as the sole basis for treatment or other  patient management decisions.  A negative result may occur with  improper specimen collection / handling, submission of specimen other  than nasopharyngeal swab, presence of viral mutation(s) within the  areas targeted by this assay, and inadequate number of viral copies  (<250 copies / mL). A negative result must be combined with clinical  observations, patient history, and epidemiological information. If result is POSITIVE SARS-CoV-2 target nucleic acids are DETECTED. The SARS-CoV-2 RNA is generally detectable in upper and lower  respiratory specimens dur ing the acute phase of infection.  Positive  results are indicative of active infection with SARS-CoV-2.  Clinical  correlation with patient history and other diagnostic information is  necessary to determine patient infection status.  Positive results do  not rule out bacterial infection or co-infection with other viruses. If result is PRESUMPTIVE POSTIVE SARS-CoV-2 nucleic acids MAY BE PRESENT.   A presumptive positive result was obtained on the submitted specimen  and confirmed on repeat testing.  While 2019 novel coronavirus  (SARS-CoV-2) nucleic acids may be present in the submitted sample  additional confirmatory testing may be necessary for epidemiological  and / or clinical management purposes  to differentiate between  SARS-CoV-2 and other Sarbecovirus currently known to infect humans.  If clinically indicated additional testing with an alternate test  methodology [phone]) is advised. The SARS-CoV-2 RNA is generally  detectable in upper and lower respiratory sp ecimens during the acute  phase of infection. The expected result is Negative. Fact Sheet for Patients:  StrictlyIdeas.no Fact Sheet for Healthcare Providers: BankingDealers.co.za This test is not yet approved or cleared by  the [place] FDA and has been authorized for detection and/or diagnosis of SARS-CoV-2 by FDA under an Emergency Use Authorization (EUA).  This EUA will remain in effect (meaning this test can be used) for the duration of the COVID-19 declaration under Section 564(b)(1) of the Act, 21 U.S.C. section 360bbb-3(b)(1), unless the authorization is terminated or revoked sooner. Performed at [facility], [address] [place], [place] [address]      Scheduled Meds:  carvedilol  3.125 mg Oral BID WC   enoxaparin (LOVENOX) injection  30 mg Subcutaneous Q24H   feeding supplement (ENSURE ENLIVE)  237 mL Oral BID BM   LORazepam  0.5 mg Oral Once   multivitamin with minerals  1 tablet Oral Daily   Continuous Infusions:  Procedures/Studies: Dg Chest 2 View  Result Date: 05/06/2019 CLINICAL DATA:  Altered mental status.  Involuntary commitment. EXAM: CHEST - 2 VIEW COMPARISON:  Chest radiograph 06/21/2017 FINDINGS: Stable cardiac and mediastinal contours. Pulmonary hyperinflation. Emphysematous changes. No large area of pulmonary consolidation. No pleural effusion or pneumothorax. IMPRESSION: No acute cardiopulmonary process. Electronically Signed   By: Lovey Newcomer M.D.   On: 05/06/2019 19:46    Orson Eva, DO  [facility]  Pager [phone]  If 7PM-7AM, please contact night-coverage [email or web address] Password [email or web address] 05/20/2019, 4:25 PM   LOS: 2 days

## 2019-05-20 NOTE — Plan of Care (Signed)

## 2019-05-21 LAB — THYROID STIMULATING IMMUNOGLOBULIN: Thyroid Stimulating Immunoglob: <0.1 IU/L (ref 0.00–0.55)

## 2019-05-21 NOTE — Progress Notes (Signed)
PROGRESS NOTE  Ruth Davidson YNW:[id number] DOB: 08-28-1941 DOA: 05/16/2019 PCP: Avon GullyFanta, Tesfaye, MD  Brief History:  78 year old female with history of coronary artery disease status post remote PCI, systolic CHF EF 20% in 2018, tobacco abuse, COPD, dementia, hypothyroidism, and hyperlipidemia presenting with poor oral intake and increasing confusion.Unfortunately, the patient is unable to provide any significant history. The patient does not know why she is in the hospital. The patient herself denies any fevers, chills, headache, chest pain, shortness breath, nausea, vomiting, diarrhea, abdominal pain. According to patient's daughter, the patient has had very poor oral intake for the last 2 weeks and continues to smoke. In addition, the patient has refused to take any medications for several months. There are no reports of fevers, chills, chest pain, shortness breath, vomiting, diarrhea, abdominal pain. Upon presentation, the patient was afebrile hemodynamically stable saturating 95% room air. Potassium was noted to be 2.4 with serum creatinine 0.93. Lactic acid was 1.1. UA was negative for pyuria. The patient was admitted for failure to thrive and dehydration.The patient was placed on IV fluids and her potassium was repleted. Her renal function improved and electrolytes were optimized. Physical therapy was consulted and recommended 24-hour supervision and possible skilled nursing facility. Unfortunately, the patient continued to have poor oral intake. The patient's daughter was updated throughout the hospitalization. Unfortunately, the patient's insurance company denied placement into a skilled nursing facility. I subsequently had a peer-to-peer discussion with the patient's insurance company representative. Unfortunately, authorization could not be obtained despite the peer to peer review. Subsequently discussed with the patient's daughter (on 05/19/19)after the peer to peer  review and updated her on the situation. I told the patient's daughter that the patient is medically stable. I offered home health physical therapy, RN, and social work with the assistance of our case Production designer, theatre/television/filmmanager. Daughter appealed insurance denial, and she refused to take patient home.  Therefore, the patient remained in the hospital until the situation was worked out.  APS referral was sent.  Work-up during the hospitalization did not reveal any reversible causes of the patient's intermittent agitation. Therefore, this was likely attributed to the patient's continued cognitive decline from her dementia. It was noted from blood work that the patient had hyperthyroidism. Thyroid-stimulating antibodies were ordered and are pending at the time of discharge. Palliative medicine was consulted. After discussion with the patient's family, the patient was transitioned to DNR status.   Assessment/Plan: Failure to thrive -Check serum B12--265 -Folic acid--16.1 -TSH--0.057 -Free T4--1.55 -PT evaluation-->SNF -UA--no significant pyuria -intermittentlyrefuse to eat; has poor po intake -awaiting SNF placement--denied by insurance company despite peer to peer review/discussion on 05/19/19 -set up HHPT, HHRN, HHSW -daughter refuses to take patient home; appealing insurance denial.  Hyperthyroidism -check thyroid receptor antibodies (thyroid stimulating immunoglobulin)-->normal -obtain RAIU as outpatient -TSH--0.057 -Free T4--1.55  Hypokalemia -Repleted -Magnesium 1.9  Chronic systolic and diastolic CHF -06/22/2017 echo EF 15-20%, diffuse HK, grade 1 DD, dilated IVC, trivial TR -Appears clinically dry--improved after fluids -Daily weights--stable -restart lasix after discharge  COPD -Stable on room air  Tobacco abuse -Tobacco cessation discussed -no desire to quit  Coronary artery disease -No chest pain presently -Personally reviewed EKG--sinus rhythm, nonspecific T wave  change  Severe protein calorie malnutrition -Continue Ensure  Dementia without behavior disturbance -daughter relates progressive cognitive decline over last 6 months -at risk for hospital delirium  Hyperlipidemia -Continue statin  Goals of Care -palliative consult-->change to DNR after conversations with daughter  and family        Disposition Plan:   Home vs SNF pending appeal of insurance auth Family Communication:   Daughter updated on 7/18 on phone  Consultants:  none  Code Status:  DNR  DVT Prophylaxis:    Lovenox   Procedures: As Listed in Progress Note Above  Antibiotics: None   Subjective: Pt is pleasantly confused.  She denies f/c headache, cp, sob, abd pain, n/v.  Objective: Vitals:   05/20/19 0524 05/20/19 2046 05/20/19 2118 05/21/19 0639  BP: (!) 107/52  128/75 (!) 103/54  Pulse: 87  92 87  Resp: 18  18 16   Temp: 98.7 F (37.1 C)  98.5 F (36.9 C) 98.1 F (36.7 C)  TempSrc: Oral  Oral Oral  SpO2: 91% 95% 93% 93%  Weight:      Height:        Intake/Output Summary (Last 24 hours) at 05/21/2019 1627 Last data filed at 05/21/2019 1411 Gross per 24 hour  Intake 300 ml  Output --  Net 300 ml   Weight change:  Exam:   General:  Pt is alert, not in acute distress  HEENT: No icterus, No thrush, No neck mass, [place]/AT  Cardiovascular: RRR, S1/S2, no rubs, no gallops  Respiratory: diminished breath sounds bilateral.  Bibasilar rales  Abdomen: Soft/+BS, non tender, non distended, no guarding  Extremities: No edema, No lymphangitis, No petechiae, No rashes, no synovitis   Data Reviewed: I have personally reviewed following labs and imaging studies Basic Metabolic Panel: Recent Labs  Lab 05/16/19 2232 05/17/19 1109 05/18/19 0539 05/19/19 0459  NA 141 142 145 141  K 2.4* 3.8 4.1 4.0  CL 101 111 112* 109  CO2 30 25 23 23   GLUCOSE 118* 92 66* 72  BUN 9 8 9 10   CREATININE 0.93 0.72 0.74 0.71  CALCIUM 8.9 7.8*  8.3* 8.3*  MG 1.9  --  1.9 2.0  PHOS 2.8  --   --   --    Liver Function Tests: Recent Labs  Lab 05/16/19 2232  AST 15  ALT 9  ALKPHOS 71  BILITOT 0.6  PROT 6.6  ALBUMIN 3.4*   No results for input(s): LIPASE, AMYLASE in the last 168 hours. No results for input(s): AMMONIA in the last 168 hours. Coagulation Profile: No results for input(s): INR, PROTIME in the last 168 hours. CBC: Recent Labs  Lab 05/16/19 2232  WBC 8.6  NEUTROABS 6.0  HGB 14.8  HCT 46.3*  MCV 95.9  PLT 206   Cardiac Enzymes: No results for input(s): CKTOTAL, CKMB, CKMBINDEX, TROPONINI in the last 168 hours. BNP: Invalid input(s): POCBNP CBG: Recent Labs  Lab 05/16/19 2307  GLUCAP 103*   HbA1C: No results for input(s): HGBA1C in the last 72 hours. Urine analysis:    Component Value Date/Time   COLORURINE YELLOW 05/17/2019 0020   APPEARANCEUR HAZY (A) 05/17/2019 0020   LABSPEC 1.013 05/17/2019 0020   PHURINE 5.0 05/17/2019 0020   GLUCOSEU NEGATIVE 05/17/2019 0020   HGBUR LARGE (A) 05/17/2019 0020   BILIRUBINUR NEGATIVE 05/17/2019 0020   KETONESUR NEGATIVE 05/17/2019 0020   PROTEINUR 30 (A) 05/17/2019 0020   NITRITE NEGATIVE 05/17/2019 0020   LEUKOCYTESUR TRACE (A) 05/17/2019 0020   Sepsis Labs: @LABRCNTIP (procalcitonin:4,lacticidven:4) ) Recent Results (from the past 240 hour(s))  SARS Coronavirus 2 (CEPHEID - Performed in [place] hospital lab), Hosp Order     Status: None   Collection Time: 05/17/19 12:15 AM   Specimen: Nasopharyngeal Swab  Result Value Ref Range Status   SARS Coronavirus 2 NEGATIVE NEGATIVE Final    Comment: (NOTE) If result is NEGATIVE SARS-CoV-2 target nucleic acids are NOT DETECTED. The SARS-CoV-2 RNA is generally detectable in upper and lower  respiratory specimens during the acute phase of infection. The lowest  concentration of SARS-CoV-2 viral copies this assay can detect is 250  copies / mL. A negative result does not preclude SARS-CoV-2 infection   and should not be used as the sole basis for treatment or other  patient management decisions.  A negative result may occur with  improper specimen collection / handling, submission of specimen other  than nasopharyngeal swab, presence of viral mutation(s) within the  areas targeted by this assay, and inadequate number of viral copies  (<250 copies / mL). A negative result must be combined with clinical  observations, patient history, and epidemiological information. If result is POSITIVE SARS-CoV-2 target nucleic acids are DETECTED. The SARS-CoV-2 RNA is generally detectable in upper and lower  respiratory specimens dur ing the acute phase of infection.  Positive  results are indicative of active infection with SARS-CoV-2.  Clinical  correlation with patient history and other diagnostic information is  necessary to determine patient infection status.  Positive results do  not rule out bacterial infection or co-infection with other viruses. If result is PRESUMPTIVE POSTIVE SARS-CoV-2 nucleic acids MAY BE PRESENT.   A presumptive positive result was obtained on the submitted specimen  and confirmed on repeat testing.  While 2019 novel coronavirus  (SARS-CoV-2) nucleic acids may be present in the submitted sample  additional confirmatory testing may be necessary for epidemiological  and / or clinical management purposes  to differentiate between  SARS-CoV-2 and other Sarbecovirus currently known to infect humans.  If clinically indicated additional testing with an alternate test  methodology [id number]) is advised. The SARS-CoV-2 RNA is generally  detectable in upper and lower respiratory sp ecimens during the acute  phase of infection. The expected result is Negative. Fact Sheet for Patients:  BoilerBrush.com.cyhttps://www.fda.gov/media/136312/download Fact Sheet for Healthcare Providers: https://pope.com/https://www.fda.gov/media/136313/download This test is not yet approved or cleared by the [place] FDA  and has been authorized for detection and/or diagnosis of SARS-CoV-2 by FDA under an Emergency Use Authorization (EUA).  This EUA will remain in effect (meaning this test can be used) for the duration of the COVID-19 declaration under Section 564(b)(1) of the Act, 21 U.S.C. section 360bbb-3(b)(1), unless the authorization is terminated or revoked sooner. Performed at [facility], [address] [place], [place] [address]      Scheduled Meds:  carvedilol  3.125 mg Oral BID WC   enoxaparin (LOVENOX) injection  30 mg Subcutaneous Q24H   feeding supplement (ENSURE ENLIVE)  237 mL Oral BID BM   LORazepam  0.5 mg Oral Once   multivitamin with minerals  1 tablet Oral Daily   Continuous Infusions:  Procedures/Studies: Dg Chest 2 View  Result Date: 05/06/2019 CLINICAL DATA:  Altered mental status.  Involuntary commitment. EXAM: CHEST - 2 VIEW COMPARISON:  Chest radiograph 06/21/2017 FINDINGS: Stable cardiac and mediastinal contours. Pulmonary hyperinflation. Emphysematous changes. No large area of pulmonary consolidation. No pleural effusion or pneumothorax. IMPRESSION: No acute cardiopulmonary process. Electronically Signed   By: Annia Beltrew  Davis M.D.   On: 05/06/2019 19:46    Catarina Hartshornavid Viliami Bracco, DO  [facility] Pager [phone]  If 7PM-7AM, please contact night-coverage [email or web address] Password [email or web address] 05/21/2019, 4:27 PM   LOS: 3 days

## 2019-05-21 NOTE — Discharge Summary (Signed)
Physician Discharge Summary  Ruth Davidson:[id number] DOB: December 27, 1940 DOA: 05/16/2019  PCP: Rosita Fire, MD  Admit date: 05/16/2019 Discharge date: 05/22/2019  Admitted From: Home Disposition:  Home  Recommendations for Outpatient Follow-up:  1. Follow up with PCP in 1-2 weeks 2. Please obtain BMP/CBC in one week     Discharge Condition: Stable CODE STATUS: DNR Diet recommendation: Regular   Brief/Interim Summary: 78 year old female with history of coronary artery disease status post remote PCI, systolic CHF EF 53% in 9767, tobacco abuse, COPD, dementia, hypothyroidism, and hyperlipidemia presenting with poor oral intake and increasing confusion.Unfortunately, the patient is unable to provide any significant history. The patient does not know why she is in the hospital. The patient herself denies any fevers, chills, headache, chest pain, shortness breath, nausea, vomiting, diarrhea, abdominal pain. According to patient's daughter, the patient has had very poor oral intake for the last 2 weeks and continues to smoke. In addition, the patient has refused to take any medications for several months. There are no reports of fevers, chills, chest pain, shortness breath, vomiting, diarrhea, abdominal pain. Upon presentation, the patient was afebrile hemodynamically stable saturating 95% room air. Potassium was noted to be 2.4 with serum creatinine 0.93. Lactic acid was 1.1. UA was negative for pyuria. The patient was admitted for failure to thrive and dehydration.The patient was placed on IV fluids and her potassium was repleted. Her renal function improved and electrolytes were optimized. Physical therapy was consulted and recommended 24-hour supervision and possible skilled nursing facility. Unfortunately, the patient continued to have poor oral intake. The patient's daughter was updated throughout the hospitalization. Unfortunately, the patient's insurance company denied  placement into a skilled nursing facility. I subsequently had a peer-to-peer discussion with the patient's insurance company representative. Unfortunately, authorization could not be obtained despite the peer to peer review. Subsequently discussed with the patient's daughter (on 05/19/19)after the peer to peer review and updated her on the situation. I told the patient's daughter that the patient is medically stable. I offered home health physical therapy, RN, and social work with the assistance of our case Freight forwarder. Daughter appealed insurance denial, and she refused to take patient home. Therefore, the patient remained in the hospital until the situation was worked out. APS referral was sent. On 05/22/19, the daughter decided it was in patient's best interest for her to take patient home.  Therefore, HH PT, RN, SW and Aide were set up.  Work-up during the hospitalization did not reveal any reversible causes of the patient's intermittent agitation. Therefore, this was likely attributed to the patient's continued cognitive decline from her dementia. It was noted from blood work that the patient had hyperthyroidism. Thyroid-stimulating antibodies were ordered and are negative. Patient will need RAIU study performed as outpatient to clarify etiology of hyperthyroidism. Palliative medicine was consulted. After discussion with the patient's family, the patient was transitioned to DNR status.   Discharge Diagnoses:  Failure to thrive -Check serum H41--937 -Folic TKWI--09.7 -DZH--2.992 -Free T4--1.55 -PT evaluation-->SNF -UA--no significant pyuria -intermittentlyrefuse to eat or take medications; has poor po intake -awaiting SNF placement--denied by insurance company despite peer to peer review/discussion on 05/19/19 -set up [place], [place], [facility] if cannot be placed in ALF or SNF -daughter refuses to take patient home; appealing insurance denial. -05/22/19--daughter changed mind and agrees to take  patient home  Hyperthyroidism -check thyroid receptor antibodies (thyroid stimulating immunoglobulin)-->normal -obtain RAIU as outpatient -TSH--0.057 -Free T4--1.55  Hypokalemia -Repleted -Magnesium 1.9  Chronic systolic and diastolic CHF -02/26/8340  echo EF 15-20%, diffuse HK, grade 1 DD, dilated IVC, trivial TR -Appears clinically dry--improved after fluids -Daily weights--stable -restart lasix to 20 mg daily after discharge at lower dose as pt has very poor po intake -restart coreg  COPD -Stable on room air  Tobacco abuse -Tobacco cessation discussed -no desire to quit  Coronary artery disease -No chest pain presently -Personally reviewed EKG--sinus rhythm, nonspecific T wave change  Severe protein calorie malnutrition -Continue Ensure  Dementia without behavior disturbance -daughter relates progressive cognitive decline over last 6 months -at risk for hospital delirium  Hyperlipidemia -Continue statin  Goals of Care -palliative consult-->change to DNR after conversations with daughter and family     Discharge Instructions   Allergies as of 05/21/2019   No Known Allergies     Medication List    TAKE these medications   aspirin 81 MG chewable tablet Chew 1 tablet (81 mg total) by mouth daily. Restart on 06/28/17   carvedilol 3.125 MG tablet Commonly known as: COREG TAKE 1 TABLET BY MOUTH TWICE DAILY WITH A MEAL   feeding supplement (ENSURE ENLIVE) Liqd Take 237 mLs by mouth 2 (two) times daily between meals.   furosemide 40 MG tablet Commonly known as: LASIX TAKE ONE TABLET BY MOUTH ONCE DAILY   nitroGLYCERIN 0.4 MG SL tablet Commonly known as: NITROSTAT Place 1 tablet (0.4 mg total) under the tongue every 5 (five) minutes as needed for chest pain (or back pain or arm pain).   potassium chloride SA 20 MEQ tablet Commonly known as: K-DUR TAKE 1 TABLET BY MOUTH ONCE DAILY   simvastatin 80 MG tablet Commonly known as: ZOCOR TAKE  ONE TABLET BY MOUTH ONCE DAILY AT BEDTIME       No Known Allergies  Consultations:  Palliative medicine   Procedures/Studies: Dg Chest 2 View  Result Date: 05/06/2019 CLINICAL DATA:  Altered mental status.  Involuntary commitment. EXAM: CHEST - 2 VIEW COMPARISON:  Chest radiograph 06/21/2017 FINDINGS: Stable cardiac and mediastinal contours. Pulmonary hyperinflation. Emphysematous changes. No large area of pulmonary consolidation. No pleural effusion or pneumothorax. IMPRESSION: No acute cardiopulmonary process. Electronically Signed   By: Annia Beltrew  Davis M.D.   On: 05/06/2019 19:46        Discharge Exam: Vitals:   05/20/19 2118 05/21/19 0639  BP: 128/75 (!) 103/54  Pulse: 92 87  Resp: 18 16  Temp: 98.5 F (36.9 C) 98.1 F (36.7 C)  SpO2: 93% 93%   Vitals:   05/20/19 0524 05/20/19 2046 05/20/19 2118 05/21/19 0639  BP: (!) 107/52  128/75 (!) 103/54  Pulse: 87  92 87  Resp: 18  18 16   Temp: 98.7 F (37.1 C)  98.5 F (36.9 C) 98.1 F (36.7 C)  TempSrc: Oral  Oral Oral  SpO2: 91% 95% 93% 93%  Weight:      Height:        General: Pt is alert, awake, not in acute distress Cardiovascular: RRR, S1/S2 +, no rubs, no gallops Respiratory: diminished breath sounds bu CTA Abdominal: Soft, NT, ND, bowel sounds + Extremities: no edema, no cyanosis   The results of significant diagnostics from this hospitalization (including imaging, microbiology, ancillary and laboratory) are listed below for reference.    Significant Diagnostic Studies: Dg Chest 2 View  Result Date: 05/06/2019 CLINICAL DATA:  Altered mental status.  Involuntary commitment. EXAM: CHEST - 2 VIEW COMPARISON:  Chest radiograph 06/21/2017 FINDINGS: Stable cardiac and mediastinal contours. Pulmonary hyperinflation. Emphysematous changes. No large area of pulmonary consolidation. No  pleural effusion or pneumothorax. IMPRESSION: No acute cardiopulmonary process. Electronically Signed   By: Annia Beltrew  Davis M.D.   On:  05/06/2019 19:46     Microbiology: Recent Results (from the past 240 hour(s))  SARS Coronavirus 2 (CEPHEID - Performed in [facility] lab), Hosp Order     Status: None   Collection Time: 05/17/19 12:15 AM   Specimen: Nasopharyngeal Swab  Result Value Ref Range Status   SARS Coronavirus 2 NEGATIVE NEGATIVE Final    Comment: (NOTE) If result is NEGATIVE SARS-CoV-2 target nucleic acids are NOT DETECTED. The SARS-CoV-2 RNA is generally detectable in upper and lower  respiratory specimens during the acute phase of infection. The lowest  concentration of SARS-CoV-2 viral copies this assay can detect is 250  copies / mL. A negative result does not preclude SARS-CoV-2 infection  and should not be used as the sole basis for treatment or other  patient management decisions.  A negative result may occur with  improper specimen collection / handling, submission of specimen other  than nasopharyngeal swab, presence of viral mutation(s) within the  areas targeted by this assay, and inadequate number of viral copies  (<250 copies / mL). A negative result must be combined with clinical  observations, patient history, and epidemiological information. If result is POSITIVE SARS-CoV-2 target nucleic acids are DETECTED. The SARS-CoV-2 RNA is generally detectable in upper and lower  respiratory specimens dur ing the acute phase of infection.  Positive  results are indicative of active infection with SARS-CoV-2.  Clinical  correlation with patient history and other diagnostic information is  necessary to determine patient infection status.  Positive results do  not rule out bacterial infection or co-infection with other viruses. If result is PRESUMPTIVE POSTIVE SARS-CoV-2 nucleic acids MAY BE PRESENT.   A presumptive positive result was obtained on the submitted specimen  and confirmed on repeat testing.  While 2019 novel coronavirus  (SARS-CoV-2) nucleic acids may be present in the  submitted sample  additional confirmatory testing may be necessary for epidemiological  and / or clinical management purposes  to differentiate between  SARS-CoV-2 and other Sarbecovirus currently known to infect humans.  If clinically indicated additional testing with an alternate test  methodology [phone]) is advised. The SARS-CoV-2 RNA is generally  detectable in upper and lower respiratory sp ecimens during the acute  phase of infection. The expected result is Negative. Fact Sheet for Patients:  BoilerBrush.com.cyhttps://www.fda.gov/media/136312/download Fact Sheet for Healthcare Providers: https://pope.com/https://www.fda.gov/media/136313/download This test is not yet approved or cleared by the [place] FDA and has been authorized for detection and/or diagnosis of SARS-CoV-2 by FDA under an Emergency Use Authorization (EUA).  This EUA will remain in effect (meaning this test can be used) for the duration of the COVID-19 declaration under Section 564(b)(1) of the Act, 21 U.S.C. section 360bbb-3(b)(1), unless the authorization is terminated or revoked sooner. Performed at [facility], [address] [place], [place] [id number]      Labs: Basic Metabolic Panel: Recent Labs  Lab 05/16/19 2232 05/17/19 1109 05/18/19 0539 05/19/19 0459  NA 141 142 145 141  K 2.4* 3.8 4.1 4.0  CL 101 111 112* 109  CO2 30 25 23 23   GLUCOSE 118* 92 66* 72  BUN 9 8 9 10   CREATININE 0.93 0.72 0.74 0.71  CALCIUM 8.9 7.8* 8.3* 8.3*  MG 1.9  --  1.9 2.0  PHOS 2.8  --   --   --    Liver Function Tests: Recent Labs  Lab 05/16/19 2232  AST 15  ALT 9  ALKPHOS 71  BILITOT 0.6  PROT 6.6  ALBUMIN 3.4*   No results for input(s): LIPASE, AMYLASE in the last 168 hours. No results for input(s): AMMONIA in the last 168 hours. CBC: Recent Labs  Lab 05/16/19 2232  WBC 8.6  NEUTROABS 6.0  HGB 14.8  HCT 46.3*  MCV 95.9  PLT 206   Cardiac Enzymes: No results for input(s): CKTOTAL, CKMB, CKMBINDEX, TROPONINI in the last  168 hours. BNP: Invalid input(s): POCBNP CBG: Recent Labs  Lab 05/16/19 2307  GLUCAP 103*    Time coordinating discharge:  36 minutes  Signed:  Catarina Hartshornavid Tyleigh Mahn, DO [facility] Pager: [phone] 05/21/2019, 5:01 PM

## 2019-05-21 NOTE — Progress Notes (Signed)
Pt resting in bed, awake, drowsy. Tolerated po fluids and oral medication without difficulty, but poor total oral intake throughout the day. Has refused both lunch and supper trays. Remains confused, oriented to person only, no improvement with multiple attempts at reorientation. Patient' daughter Ruth Davidson called and was updated on patient's condition.

## 2019-05-21 NOTE — Plan of Care (Signed)
Patient is confused x3, unable to reorient. Unable to educate.   Problem: Clinical Measurements: Goal: Ability to maintain clinical measurements within normal limits will improve Outcome: Progressing Goal: Will remain free from infection Outcome: Progressing Goal: Diagnostic test results will improve Outcome: Progressing Goal: Respiratory complications will improve Outcome: Progressing Goal: Cardiovascular complication will be avoided Outcome: Progressing   Problem: Activity: Goal: Risk for activity intolerance will decrease Outcome: Progressing   Problem: Nutrition: Goal: Adequate nutrition will be maintained Outcome: Progressing   Problem: Elimination: Goal: Will not experience complications related to bowel motility Outcome: Progressing Goal: Will not experience complications related to urinary retention Outcome: Progressing   Problem: Pain Managment: Goal: General experience of comfort will improve Outcome: Progressing   Problem: Safety: Goal: Ability to remain free from injury will improve Outcome: Progressing   Problem: Skin Integrity: Goal: Risk for impaired skin integrity will decrease Outcome: Progressing

## 2019-05-21 NOTE — Progress Notes (Signed)
1130: Patient has refused all oral medications and all food/fluid. Restless, agitated, states has "lost all my money". MD notified of refusal of meds. Patient ambulated in hallway and in room, attempted to reorient multiple times without success. Bed and chair alarms remain on for patient safety.

## 2019-05-22 LAB — THYROID ANTIBODIES
Thyroglobulin Antibody: <1 IU/mL (ref 0.0–0.9)
Thyroperoxidase Ab SerPl-aCnc: <6 IU/mL (ref 0–34)

## 2019-05-22 MED ORDER — POTASSIUM CHLORIDE CRYS ER 10 MEQ PO TBCR: 20.0000 meq | EXTENDED_RELEASE_TABLET | Freq: Every day | ORAL | 0 refills | Status: DC

## 2019-05-22 MED ORDER — HALOPERIDOL LACTATE 5 MG/ML IJ SOLN
2.5000 mg | Freq: Once | INTRAMUSCULAR | Status: AC
  Administered 2019-05-22: 2.5 mg via INTRAVENOUS
  Filled 2019-05-22: qty 1

## 2019-05-22 MED ORDER — FUROSEMIDE 20 MG PO TABS: 40.0000 mg | ORAL_TABLET | Freq: Every day | ORAL | 1 refills | Status: DC

## 2019-05-22 MED ORDER — CARVEDILOL 3.125 MG PO TABS: ORAL_TABLET | ORAL | 1 refills | Status: DC

## 2019-05-22 MED ORDER — SIMVASTATIN 80 MG PO TABS: 80.0000 mg | ORAL_TABLET | Freq: Every day | ORAL | 1 refills | Status: DC

## 2019-05-22 MED ORDER — LORAZEPAM 0.5 MG PO TABS: 0.5000 mg | ORAL_TABLET | Freq: Every evening | ORAL | 0 refills | Status: DC | PRN

## 2019-05-22 NOTE — Progress Notes (Signed)
Patient disoriented and climbing out of bed. Patient ambulated down the hall and back with assistance. Refusing to get back in bed. MD notified. New order for Haldol given.

## 2019-05-22 NOTE — Care Management Important Message (Signed)
Important Message  Patient Details  Name: Ruth Davidson MRN: [id number] Date of Birth: 05-19-1941   Medicare Important Message Given:  Yes(palced at bedside table)     Tommy Medal 05/22/2019, 10:34 AM

## 2019-05-22 NOTE — Progress Notes (Signed)
Pt resting quietly in bed. Awake, alert, oriented only to person. Refusing all po fluids and foods today. Have offered multiple different fluids and food types throughout the day.

## 2019-05-22 NOTE — TOC Progression Note (Signed)
Transition of Care [facility]) - Progression Note    Patient Details  Name: Ruth Davidson MRN: [id number] Date of Birth: Aug 30, 1941  Transition of Care [facility]) CM/SW Contact  Shade Flood, LCSW Phone Number: 05/22/2019, 3:49 PM  Clinical Narrative:     TOC following. Spoke with pt's daughter this AM regarding dc planning. Asked about the appeal to pt's insurance. Daughter stated that she decided to "postpone" doing the appeal. She was apparently told she had 60 days to appeal. Dtr then informed LCSW that she wanted to take pt home with [facility]. Referral sent to [facility] at dtr request and pt was accepted. Daughter then called LCSW at 3:00 stating that now she wants to pursue placement at [facility].  Contacted Conservation officer, historic buildings at [facility] who stated that they can "likely" take pt if they get $10,000 up front for the first month of care and they can confirm with the attorney who has control of pt's money that he will also be available to pay the second month/spend down pt's money so that she becomes eligible for [place] to apply for [facility] for pt.   Message left for the attorney to please call Lynnae Sandhoff at [facility] and also [facility] here at [employer] to coordinate.   Updated MD who requested APS referral. Referral called in but they did not accept the case stating that we needed to work with the attorney and pt's daughter to arrange placement.   TOC will continue to follow and assist.   Expected Discharge Plan: [place] Barriers to Discharge: Barriers Resolved  Expected Discharge Plan and Services Expected Discharge Plan: [place]         Expected Discharge Date: 05/22/19                         HH Arranged: RN, PT, Nurse's Aide, Social Work   Date [facility] Agency Contacted: 05/22/19       Social Determinants of Health ([place]) Interventions    Readmission Risk Interventions Readmission Risk Prevention Plan 05/22/2019  Transportation Screening  Complete  Home Care Screening Complete  Medication Review (RN CM) Complete  Some recent data might be hidden

## 2019-05-22 NOTE — Progress Notes (Signed)
Pt is refusing to sit in the recliner or bed where a bed alarm can be used for her safety. States she just wants to sit and look out the window. Nursing staff was able to get the chair alarm to reach at the chair near the window. Pt still disoriented X4. That satisfied patient briefly, but she still attempts to get up and is wanting to wander out to the hallway. Will continue to try and re-orient patient and get her to get in the bed.

## 2019-05-22 NOTE — Progress Notes (Signed)
Physical Therapy Treatment Patient Details Name: Ruth SagesJudy M Varden MRN: [id number] DOB: October 03, 1941 Today's Date: 05/22/2019    History of Present Illness Ruth Davidson is a 78 y.o. female with medical history significant of CAD, history of MI, cardiac cachexia, chronic diastolic CHF, COPD, tobacco use, dementia, medical treatment noncompliance, hypothyroidism who is coming to the emergency department brought by her daughter due to mild worsening in dementia and lack of appetite for the past 2 weeks.  The patient is unable to provide further history.    PT Comments    Patient agreeable for therapy and able to follow directions consistently.  Patient demonstrates slow labored movement for sitting up at bedside, sit to stands, transfers and ambulation with tendency to lean on near by objects for support when not using an AD, required use of RW for safety and limited for ambulation due to c/o fatigue.  Patient tolerated sitting up in chair after therapy and encouraged to eat breakfast - RN notified.  Patient will benefit from continued physical therapy in hospital and recommended venue below to increase strength, balance, endurance for safe ADLs and gait.   Follow Up Recommendations  SNF;Supervision - Intermittent     Equipment Recommendations  None recommended by PT;Rolling walker with 5" wheels    Recommendations for Other Services       Precautions / Restrictions Precautions Precautions: Fall Restrictions Weight Bearing Restrictions: No    Mobility  Bed Mobility Overal bed mobility: Needs Assistance Bed Mobility: Supine to Sit     Supine to sit: Min guard;Supervision     General bed mobility comments: increased time, labored movement  Transfers Overall transfer level: Needs assistance Equipment used: None;Rolling walker (2 wheeled) Transfers: Sit to/from [employer] CorporationStand;Stand Pivot Transfers Sit to Stand: Min guard Stand pivot transfers: Min guard       General transfer comment:  slow unsteady movement with tendency to lean on nearby objects for support, had to use RW for safety  Ambulation/Gait Ambulation/Gait assistance: Min guard Gait Distance (Feet): 60 Feet Assistive device: Rolling walker (2 wheeled) Gait Pattern/deviations: Decreased step length - right;Decreased step length - left;Decreased stride length Gait velocity: decreased   General Gait Details: slow unsteady labored cadence with tendency to lean on nearby objects for support, required use of RW for safety, limited secondary to c/o fatigue   Stairs             Wheelchair Mobility    Modified Rankin (Stroke Patients Only)       Balance Overall balance assessment: Needs assistance Sitting-balance support: Feet supported;No upper extremity supported Sitting balance-Leahy Scale: Good     Standing balance support: During functional activity;No upper extremity supported Standing balance-Leahy Scale: Poor Standing balance comment: fair/poor without AD, fair using RW                            Cognition Arousal/Alertness: Awake/alert Behavior During Therapy: WFL for tasks assessed/performed Overall Cognitive Status: History of cognitive impairments - at baseline                                        Exercises General Exercises - Lower Extremity Long Arc Quad: Seated;AROM;Strengthening;10 reps Hip Flexion/Marching: Seated;AROM;Strengthening;10 reps Toe Raises: Seated;AROM;Strengthening;10 reps Heel Raises: Seated;AROM;Strengthening;10 reps    General Comments        Pertinent Vitals/Pain Pain Assessment: No/denies pain  Home Living                      Prior Function            PT Goals (current goals can now be found in the care plan section) Acute Rehab PT Goals Patient Stated Goal: return home with family to assist PT Goal Formulation: With patient Time For Goal Achievement: 06/01/19 Potential to Achieve Goals: Good Progress  towards PT goals: Progressing toward goals    Frequency    Min 3X/week      PT Plan Current plan remains appropriate    Co-evaluation              AM-PAC PT "6 Clicks" Mobility   Outcome Measure  Help needed turning from your back to your side while in a flat bed without using bedrails?: None Help needed moving from lying on your back to sitting on the side of a flat bed without using bedrails?: A Little Help needed moving to and from a bed to a chair (including a wheelchair)?: A Little Help needed standing up from a chair using your arms (e.g., wheelchair or bedside chair)?: A Little Help needed to walk in hospital room?: A Little Help needed climbing 3-5 steps with a railing? : A Little 6 Click Score: 19    End of Session   Activity Tolerance: Patient tolerated treatment well;Patient limited by fatigue Patient left: in chair;with call bell/phone within reach;with chair alarm set Nurse Communication: Mobility status PT Visit Diagnosis: Unsteadiness on feet (R26.81);Other abnormalities of gait and mobility (R26.89);Muscle weakness (generalized) (M62.81)     Time: 7078-6754 PT Time Calculation (min) (ACUTE ONLY): 30 min  Charges:  $Gait Training: 8-22 mins $Therapeutic Exercise: 8-22 mins                     8:43 AM, 05/22/19 Lonell Grandchild, MPT Physical Therapist with [facility] [phone] office [phone] mobile phone

## 2019-05-22 NOTE — Progress Notes (Signed)
Pt was adamant about going out into the hallway and walking. Attending RN took patient for a walk for about 10 mins and 100 feet total. Tolerated fair, pt stated she was cold. RN educated patient that a warm blanket and the heat can be controlled in her room. Pt went back to her room and laid in bed with a warm blanket and call bell within reach. Will continue to monitor and walk as patient desires.

## 2019-05-23 NOTE — Progress Notes (Signed)
Physical Therapy Treatment Patient Details Name: Ruth Davidson MRN: [id number] DOB: September 30, 1941 Today's Date: 05/23/2019    History of Present Illness Ruth Davidson is a 78 y.o. female with medical history significant of CAD, history of MI, cardiac cachexia, chronic diastolic CHF, COPD, tobacco use, dementia, medical treatment noncompliance, hypothyroidism who is coming to the emergency department brought by her daughter due to mild worsening in dementia and lack of appetite for the past 2 weeks.  The patient is unable to provide further history.    PT Comments    Patient presents standing in room with nursing staff assisting and required encouragement to walk outside of room due to c/o fatigue.  Patient demonstrated poor carryover for attempting exercises due to fatigue, slow labored cadence with occasional buckling of knees due to weakness during gait training.  Patient requested to go back to bed after therapy.  Patient will benefit from continued physical therapy in hospital and recommended venue below to increase strength, balance, endurance for safe ADLs and gait.   Follow Up Recommendations  SNF;Supervision - Intermittent     Equipment Recommendations  Rolling walker with 5" wheels    Recommendations for Other Services       Precautions / Restrictions Precautions Precautions: Fall Restrictions Weight Bearing Restrictions: No    Mobility  Bed Mobility Overal bed mobility: Needs Assistance Bed Mobility: Sit to Supine     Supine to sit: Min guard;Supervision Sit to supine: Supervision;Min guard   General bed mobility comments: increased time, labored movement  Transfers Overall transfer level: Needs assistance Equipment used: Rolling walker (2 wheeled);None;1 person hand held assist Transfers: Sit to/from [employer]Stand;Stand Pivot Transfers Sit to Stand: Min guard;Min assist Stand pivot transfers: Min guard;Min assist       General transfer comment: very slow labored  movement, requires hand held assist when transferring without AD, required use of RW for safety  Ambulation/Gait Ambulation/Gait assistance: Min assist Gait Distance (Feet): 55 Feet Assistive device: Rolling walker (2 wheeled) Gait Pattern/deviations: Decreased step length - right;Decreased step length - left;Decreased stride length Gait velocity: decreased   General Gait Details: slow labored cadence with short step/stride length, had to use RW due to BLE weakness and limited mostly due to c/o fatigue   Stairs             Wheelchair Mobility    Modified Rankin (Stroke Patients Only)       Balance Overall balance assessment: Needs assistance Sitting-balance support: Feet supported;No upper extremity supported Sitting balance-Leahy Scale: Fair Sitting balance - Comments: fair/good seated at bedside   Standing balance support: During functional activity;No upper extremity supported Standing balance-Leahy Scale: Poor Standing balance comment: fair/poor without AD, fair using RW                            Cognition Arousal/Alertness: Awake/alert Behavior During Therapy: WFL for tasks assessed/performed Overall Cognitive Status: History of cognitive impairments - at baseline                                 General Comments: hx of dementia      Exercises      General Comments        Pertinent Vitals/Pain Pain Assessment: No/denies pain    Home Living  Prior Function            PT Goals (current goals can now be found in the care plan section) Acute Rehab PT Goals Patient Stated Goal: return home with family to assist PT Goal Formulation: With patient Time For Goal Achievement: 06/01/19 Potential to Achieve Goals: Good Progress towards PT goals: Progressing toward goals    Frequency    Min 3X/week      PT Plan      Co-evaluation              AM-PAC PT "6 Clicks" Mobility   Outcome  Measure  Help needed turning from your back to your side while in a flat bed without using bedrails?: None Help needed moving from lying on your back to sitting on the side of a flat bed without using bedrails?: A Little Help needed moving to and from a bed to a chair (including a wheelchair)?: A Little Help needed standing up from a chair using your arms (e.g., wheelchair or bedside chair)?: A Little Help needed to walk in hospital room?: A Little Help needed climbing 3-5 steps with a railing? : A Little 6 Click Score: 19    End of Session Equipment Utilized During Treatment: Gait belt Activity Tolerance: Patient tolerated treatment well Patient left: in bed;with call bell/phone within reach;with bed alarm set Nurse Communication: Mobility status PT Visit Diagnosis: Unsteadiness on feet (R26.81);Other abnormalities of gait and mobility (R26.89);Muscle weakness (generalized) (M62.81)     Time: 4967-5916 PT Time Calculation (min) (ACUTE ONLY): 22 min  Charges:  $Gait Training: 8-22 mins                     12:34 PM, 05/23/19 Lonell Grandchild, MPT Physical Therapist with [facility] 336 [phone] office [phone] mobile phone

## 2019-05-23 NOTE — Progress Notes (Signed)
Patient resting in bed at this time. Will continue to monitor.

## 2019-05-23 NOTE — Progress Notes (Signed)
PROGRESS NOTE  Ruth Davidson ZDG:[id number] DOB: [birth year] DOA: 05/16/2019 PCP: Rosita Fire, MD  Brief History:  78 year old female with history of coronary artery disease status post remote PCI, systolic CHF EF 59% in 5638, tobacco abuse, COPD, dementia, hypothyroidism, and hyperlipidemia presenting with poor oral intake and increasing confusion.Unfortunately, the patient is unable to provide any significant history. The patient does not know why she is in the hospital. The patient herself denies any fevers, chills, headache, chest pain, shortness breath, nausea, vomiting, diarrhea, abdominal pain. According to patient's daughter, the patient has had very poor oral intake for the last 2 weeks and continues to smoke. In addition, the patient has refused to take any medications for several months. There are no reports of fevers, chills, chest pain, shortness breath, vomiting, diarrhea, abdominal pain. Upon presentation, the patient was afebrile hemodynamically stable saturating 95% room air. Potassium was noted to be 2.4 with serum creatinine 0.93. Lactic acid was 1.1. UA was negative for pyuria. The patient was admitted for failure to thrive and dehydration.The patient was placed on IV fluids and her potassium was repleted. Her renal function improved and electrolytes were optimized. Physical therapy was consulted and recommended 24-hour supervision and possible skilled nursing facility. Unfortunately, the patient continued to have poor oral intake. The patient's daughter was updated throughout the hospitalization. Unfortunately, the patient's insurance company denied placement into a skilled nursing facility. I subsequently had a peer-to-peer discussion with the patient's insurance company representative. Unfortunately, authorization could not be obtained despite the peer to peer review. Subsequently discussed with the patient's daughter (on 05/19/19)after the peer to peer  review and updated her on the situation. I told the patient's daughter that the patient is medically stable. I offered home health physical therapy, RN, and social work with the assistance of our case Freight forwarder. Daughter appealed insurance denial, and she refused to take patient home. Therefore, the patient remained in the hospital until the situation was worked out. APS referral was sent. On 05/22/19, the daughter decided it was in patient's best interest for her to take patient home.  Therefore, HH PT, RN, SW and Aide were set up. However, the daughter changes her mind frequently and refused to take patient home again on 05/23/19.  Work-up during the hospitalization did not reveal any reversible causes of the patient's intermittent agitation. Therefore, this was likely attributed to the patient's continued cognitive decline from her dementia. It was noted from blood work that the patient had hyperthyroidism. Thyroid-stimulating antibodies were ordered and are negative. Patient will need RAIU study performed as outpatient to clarify etiology of hyperthyroidism. Palliative medicine was consulted. After discussion with the patient's family, the patient was transitioned to DNR status.   Assessment/Plan: Failure to thrive -Check serum V56--433 -Folic IRJJ--88.4 -ZYS--0.630 -Free T4--1.55 -PT evaluation-->SNF -UA--no significant pyuria -intermittentlyrefuse to eat or take medications; has poor po intake -awaiting SNF placement--denied by insurance company despite peer to peer review/discussion on 05/19/19 -set up [place], [place], [facility] if cannot be placed in ALF or SNF -daughter refuses to take patient home; appealing insurance denial. -05/22/19--daughter changed mind and agrees to take patient home -05/23/19--daughter refuses to take patient home   Hyperthyroidism -check thyroid receptor antibodies (thyroid stimulating immunoglobulin)-->normal -obtain RAIU as outpatient -TSH--0.057 -Free  T4--1.55  Hypokalemia -Repleted -Magnesium 1.9  Chronic systolic and diastolic CHF -1/60/1093 echo EF 15-20%, diffuse HK, grade 1 DD, dilated IVC, trivial TR -Appears clinically dry--improved after fluids -Daily weights--stable -restart  lasix to 20 mg daily after discharge at lower dose as pt has very poor po intake -restart coreg  COPD -Stable on room air  Tobacco abuse -Tobacco cessation discussed -no desire to quit  Coronary artery disease -No chest pain presently -Personally reviewed EKG--sinus rhythm, nonspecific T wave change  Severe protein calorie malnutrition -Continue Ensure  Dementia without behavior disturbance -daughter relates progressive cognitive decline over last 6 months -at risk for hospital delirium  Hyperlipidemia -Continue statin  Goals of Care -palliative consult-->change to DNR after conversations with daughter and family        Disposition Plan:   Home vs SNF when bed available Family Communication:  No  Family at bedside  Consultants:  none  Code Status:  DNR  DVT Prophylaxis:  Major Lovenox   Procedures: As Listed in Progress Note Above  Antibiotics: None       Subjective: Patient denies fevers, chills, headache, chest pain, dyspnea, nausea, vomiting, diarrhea, abdominal pain, dysuria   Objective: Vitals:   05/22/19 1935 05/22/19 2125 05/23/19 0604 05/23/19 1630  BP:  109/66 (!) 96/58 (!) 155/78  Pulse:  79 80 99  Resp:  16 15 18   Temp:  (!) 97.4 F (36.3 C)  98 F (36.7 C)  TempSrc:  Oral  Oral  SpO2: 92% 100% 93% 93%  Weight:      Height:        Intake/Output Summary (Last 24 hours) at 05/23/2019 1805 Last data filed at 05/23/2019 1300 Gross per 24 hour  Intake 180 ml  Output --  Net 180 ml   Weight change:  Exam:   General:  Pt is alert, intermittently follows commands appropriately, not in acute distress  HEENT: No icterus, No thrush, No neck mass, [place]/AT  Cardiovascular: RRR, S1/S2,  no rubs, no gallops  Respiratory: diminished BS but no wheeze  Abdomen: Soft/+BS, non tender, non distended, no guarding  Extremities: No edema, No lymphangitis, No petechiae, No rashes, no synovitis   Data Reviewed: I have personally reviewed following labs and imaging studies Basic Metabolic Panel: Recent Labs  Lab 05/16/19 2232 05/17/19 1109 05/18/19 0539 05/19/19 0459  NA 141 142 145 141  K 2.4* 3.8 4.1 4.0  CL 101 111 112* 109  CO2 30 25 23 23   GLUCOSE 118* 92 66* 72  BUN 9 8 9 10   CREATININE 0.93 0.72 0.74 0.71  CALCIUM 8.9 7.8* 8.3* 8.3*  MG 1.9  --  1.9 2.0  PHOS 2.8  --   --   --    Liver Function Tests: Recent Labs  Lab 05/16/19 2232  AST 15  ALT 9  ALKPHOS 71  BILITOT 0.6  PROT 6.6  ALBUMIN 3.4*   No results for input(s): LIPASE, AMYLASE in the last 168 hours. No results for input(s): AMMONIA in the last 168 hours. Coagulation Profile: No results for input(s): INR, PROTIME in the last 168 hours. CBC: Recent Labs  Lab 05/16/19 2232  WBC 8.6  NEUTROABS 6.0  HGB 14.8  HCT 46.3*  MCV 95.9  PLT 206   Cardiac Enzymes: No results for input(s): CKTOTAL, CKMB, CKMBINDEX, TROPONINI in the last 168 hours. BNP: Invalid input(s): POCBNP CBG: Recent Labs  Lab 05/16/19 2307  GLUCAP 103*   HbA1C: No results for input(s): HGBA1C in the last 72 hours. Urine analysis:    Component Value Date/Time   COLORURINE YELLOW 05/17/2019 0020   APPEARANCEUR HAZY (A) 05/17/2019 0020   LABSPEC 1.013 05/17/2019 0020   PHURINE 5.0  05/17/2019 0020   GLUCOSEU NEGATIVE 05/17/2019 0020   HGBUR LARGE (A) 05/17/2019 0020   BILIRUBINUR NEGATIVE 05/17/2019 0020   KETONESUR NEGATIVE 05/17/2019 0020   PROTEINUR 30 (A) 05/17/2019 0020   NITRITE NEGATIVE 05/17/2019 0020   LEUKOCYTESUR TRACE (A) 05/17/2019 0020   Sepsis Labs: @LABRCNTIP (procalcitonin:4,lacticidven:4) ) Recent Results (from the past 240 hour(s))  SARS Coronavirus 2 (CEPHEID - [facility]), Hosp Order     Status: None   Collection Time: 05/17/19 12:15 AM   Specimen: Nasopharyngeal Swab  Result Value Ref Range Status   SARS Coronavirus 2 NEGATIVE NEGATIVE Final    Comment: (NOTE) If result is NEGATIVE SARS-CoV-2 target nucleic acids are NOT DETECTED. The SARS-CoV-2 RNA is generally detectable in upper and lower  respiratory specimens during the acute phase of infection. The lowest  concentration of SARS-CoV-2 viral copies this assay can detect is 250  copies / mL. A negative result does not preclude SARS-CoV-2 infection  and should not be used as the sole basis for treatment or other  patient management decisions.  A negative result may occur with  improper specimen collection / handling, submission of specimen other  than nasopharyngeal swab, presence of viral mutation(s) within the  areas targeted by this assay, and inadequate number of viral copies  (<250 copies / mL). A negative result must be combined with clinical  observations, patient history, and epidemiological information. If result is POSITIVE SARS-CoV-2 target nucleic acids are DETECTED. The SARS-CoV-2 RNA is generally detectable in upper and lower  respiratory specimens dur ing the acute phase of infection.  Positive  results are indicative of active infection with SARS-CoV-2.  Clinical  correlation with patient history and other diagnostic information is  necessary to determine patient infection status.  Positive results do  not rule out bacterial infection or co-infection with other viruses. If result is PRESUMPTIVE POSTIVE SARS-CoV-2 nucleic acids MAY BE PRESENT.   A presumptive positive result was obtained on the submitted specimen  and confirmed on repeat testing.  While 2019 novel coronavirus  (SARS-CoV-2) nucleic acids may be present in the submitted sample  additional confirmatory testing may be necessary for epidemiological  and / or clinical management purposes  to differentiate  between  SARS-CoV-2 and other Sarbecovirus currently known to infect humans.  If clinically indicated additional testing with an alternate test  methodology [phone]([id number]) is advised. The SARS-CoV-2 RNA is generally  detectable in upper and lower respiratory sp ecimens during the acute  phase of infection. The expected result is Negative. Fact Sheet for Patients:  BoilerBrush.com.cyhttps://www.fda.gov/media/136312/download Fact Sheet for Healthcare Providers: https://pope.com/https://www.fda.gov/media/136313/download This test is not yet approved or cleared by the [place] FDA and has been authorized for detection and/or diagnosis of SARS-CoV-2 by FDA under an Emergency Use Authorization (EUA).  This EUA will remain in effect (meaning this test can be used) for the duration of the COVID-19 declaration under Section 564(b)(1) of the Act, 21 U.S.C. section 360bbb-3(b)(1), unless the authorization is terminated or revoked sooner. Performed at [facility], [address] [place], [place] [address]      Scheduled Meds:  carvedilol  3.125 mg Oral BID WC   enoxaparin (LOVENOX) injection  30 mg Subcutaneous Q24H   feeding supplement (ENSURE ENLIVE)  237 mL Oral BID BM   LORazepam  0.5 mg Oral Once   multivitamin with minerals  1 tablet Oral Daily   Continuous Infusions:  Procedures/Studies: Dg Chest 2 View  Result Date: 05/06/2019 CLINICAL DATA:  Altered mental status.  Involuntary commitment. EXAM: CHEST - 2 VIEW COMPARISON:  Chest radiograph 06/21/2017 FINDINGS: Stable cardiac and mediastinal contours. Pulmonary hyperinflation. Emphysematous changes. No large area of pulmonary consolidation. No pleural effusion or pneumothorax. IMPRESSION: No acute cardiopulmonary process. Electronically Signed   By: Annia Beltrew  Davis M.D.   On: 05/06/2019 19:46    Catarina Hartshornavid Deijah Spikes, DO  [facility] Pager [phone]  If 7PM-7AM, please contact night-coverage [email or web address] Password [email or web address] 05/23/2019, 6:05 PM   LOS: 5 days

## 2019-05-23 NOTE — TOC Progression Note (Signed)
Transition of Care [facility]) - Progression Note    Patient Details  Name: Ruth Davidson MRN: [id number] Date of Birth: 10-20-1941  [facility]) CM/SW Contact  Boneta Lucks, RN Phone Number: 05/23/2019, 4:25 PM  Clinical Narrative:   Daughter called at 3:50 to state," I will not be picking up my  mother and I called Keri with APS to make a report, you can call Keri. "  Keri in turn calls [place] team lead, to stated they will not be taking the case. They can not make the family take the patient home.  APS spoke with CM after the fact of refusing the case.  After speaking with CM and getting details, still refusing to take report for assessment and will make it an outreach case. Updated MD, RN and ADH that patient would be discharged.      Expected Discharge Plan: Skilled Nursing Facility Barriers to Discharge: Barriers Resolved  Expected Discharge Plan and Services Expected Discharge Plan: [place]   Expected Discharge Date: 05/22/19                 HH Arranged: RN, PT, Nurse's Aide, Social Work [employer] Agency: [place] ([place]) Date [place]: 05/23/19 Time [place]: [place] Representative spoke with at [place]: [place] ([place]) Interventions    Readmission Risk Interventions Readmission Risk Prevention Plan 05/22/2019  Transportation Screening Complete  Home Care Screening Complete  Medication Review (RN CM) Complete  Some recent data might be hidden

## 2019-05-23 NOTE — TOC Transition Note (Signed)
Transition of Care [facility]) - CM/SW Discharge Note   Patient Details  Name: Ruth Davidson MRN: [id number] Date of Birth: 1941-04-25  Transition of Care [facility]) CM/SW Contact:  Boneta Lucks, RN Phone Number: 05/23/2019, 1:17 PM   Clinical Narrative:   Following up with Jayme Cloud, attorney today. Ask him to Speak with Lynnae Sandhoff at [facility] [place] and Update TOC.  [facility] called after speaking with the attorney, they will not be able to accept patient at this time.  Catalina Antigua has only had this case for 9 days, he does not have a guardianship account set up, does not know if she has any assets.  It will take some time to get her money. He could not estimate weeks to months.  [facility] will hold [facility] for 30 days with a bed offer, if money was to come through.  CM called the daughter to let her know, [facility] can [facility] with [facility] and they can continue the process with the attorney. Daughter said she gets off at [place] today it will be after that before she can pick her up.  [facility] with [facility] to let him know of discharge later this evening, Updated MD and RN.     Final next level of care: [place] Barriers to Discharge: Barriers Resolved   Patient Goals and CMS Choice Patient states their goals for this hospitalization and ongoing recovery are:: to go home with home health. CMS Medicare.gov Compare Post Acute Care list provided to:: Patient Represenative (must comment) Choice offered to / list presented to : Adult Children  Discharge Placement     Name of family member notified: Hurshel Keys - daughter Patient and family notified of of transfer: 05/23/19  Discharge Plan and Services   HH Arranged: RN, PT, Nurse's Aide, Social Work [employer] Agency: [place] ([place]) Date [place]: 05/23/19 Time [place]: [place] Representative spoke with at [place]: [place]   Readmission Risk Interventions Readmission Risk Prevention  Plan 05/22/2019  Transportation Screening Complete  Home Care Screening Complete  Medication Review (RN CM) Complete  Some recent data might be hidden

## 2019-05-24 DIAGNOSIS — Z9861 Coronary angioplasty status: Secondary | ICD-10-CM

## 2019-05-24 DIAGNOSIS — E782 Mixed hyperlipidemia: Secondary | ICD-10-CM

## 2019-05-24 DIAGNOSIS — I2510 Atherosclerotic heart disease of native coronary artery without angina pectoris: Secondary | ICD-10-CM

## 2019-05-24 DIAGNOSIS — Z515 Encounter for palliative care: Secondary | ICD-10-CM

## 2019-05-24 LAB — NOVEL CORONAVIRUS, NAA (HOSP ORDER, SEND-OUT TO REF LAB; TAT 18-24 HRS): SARS-CoV-2, NAA: NOT DETECTED

## 2019-05-24 LAB — CREATININE, SERUM
Creatinine, Ser: 0.7 mg/dL (ref 0.44–1.00)
GFR calc Af Amer: >60 mL/min (ref >60)
GFR calc non Af Amer: >60 mL/min (ref >60)

## 2019-05-24 MED ORDER — HALOPERIDOL LACTATE 5 MG/ML IJ SOLN
2.5000 mg | Freq: Once | INTRAMUSCULAR | Status: AC
  Administered 2019-05-24: 2.5 mg via INTRAVENOUS
  Filled 2019-05-24: qty 1

## 2019-05-24 NOTE — Progress Notes (Signed)
PROGRESS NOTE  Ruth Davidson ZDG:[id number] DOB: [birth year] DOA: 05/16/2019 PCP: Rosita Fire, MD  Brief History:  78 year old female with history of coronary artery disease status post remote PCI, systolic CHF EF 59% in 5638, tobacco abuse, COPD, dementia, hypothyroidism, and hyperlipidemia presenting with poor oral intake and increasing confusion.Unfortunately, the patient is unable to provide any significant history. The patient does not know why she is in the hospital. The patient herself denies any fevers, chills, headache, chest pain, shortness breath, nausea, vomiting, diarrhea, abdominal pain. According to patient's daughter, the patient has had very poor oral intake for the last 2 weeks and continues to smoke. In addition, the patient has refused to take any medications for several months. There are no reports of fevers, chills, chest pain, shortness breath, vomiting, diarrhea, abdominal pain. Upon presentation, the patient was afebrile hemodynamically stable saturating 95% room air. Potassium was noted to be 2.4 with serum creatinine 0.93. Lactic acid was 1.1. UA was negative for pyuria. The patient was admitted for failure to thrive and dehydration.The patient was placed on IV fluids and her potassium was repleted. Her renal function improved and electrolytes were optimized. Physical therapy was consulted and recommended 24-hour supervision and possible skilled nursing facility. Unfortunately, the patient continued to have poor oral intake. The patient's daughter was updated throughout the hospitalization. Unfortunately, the patient's insurance company denied placement into a skilled nursing facility. I subsequently had a peer-to-peer discussion with the patient's insurance company representative. Unfortunately, authorization could not be obtained despite the peer to peer review. Subsequently discussed with the patient's daughter (on 05/19/19)after the peer to peer  review and updated her on the situation. I told the patient's daughter that the patient is medically stable. I offered home health physical therapy, RN, and social work with the assistance of our case Freight forwarder. Daughter appealed insurance denial, and she refused to take patient home. Therefore, the patient remained in the hospital until the situation was worked out. APS referral was sent. On 05/22/19, the daughter decided it was in patient's best interest for her to take patient home.  Therefore, HH PT, RN, SW and Aide were set up. However, the daughter changes her mind frequently and refused to take patient home again on 05/23/19.  Work-up during the hospitalization did not reveal any reversible causes of the patient's intermittent agitation. Therefore, this was likely attributed to the patient's continued cognitive decline from her dementia. It was noted from blood work that the patient had hyperthyroidism. Thyroid-stimulating antibodies were ordered and are negative. Patient will need RAIU study performed as outpatient to clarify etiology of hyperthyroidism. Palliative medicine was consulted. After discussion with the patient's family, the patient was transitioned to DNR status.   Assessment/Plan: Failure to thrive -Check serum V56--433 -Folic IRJJ--88.4 -ZYS--0.630 -Free T4--1.55 -PT evaluation-->SNF -UA--no significant pyuria -intermittentlyrefuse to eat or take medications; has poor po intake -awaiting SNF placement--denied by insurance company despite peer to peer review/discussion on 05/19/19 -set up [place] [place], [facility] if cannot be placed in ALF or SNF -daughter refuses to take patient home; appealing insurance denial. -05/22/19--daughter changed mind and agrees to take patient home -05/23/19--daughter refuses to take patient home   Hyperthyroidism -check thyroid receptor antibodies (thyroid stimulating immunoglobulin)-->normal -obtain RAIU as outpatient -TSH--0.057 -Free  T4--1.55  Hypokalemia -Repleted -Magnesium 1.9  Chronic systolic and diastolic CHF -1/60/1093 echo EF 15-20%, diffuse HK, grade 1 DD, dilated IVC, trivial TR -Appears clinically dry--improved after fluids -Daily weights--stable -restart  lasix to 20 mg daily after discharge at lower dose as pt has very poor po intake -restart coreg  COPD -Stable on room air  Tobacco abuse -Tobacco cessation discussed -no desire to quit  Coronary artery disease -No chest pain presently -Personally reviewed EKG--sinus rhythm, nonspecific T wave change  Severe protein calorie malnutrition -Continue Ensure  Dementia without behavior disturbance -daughter relates progressive cognitive decline over last 6 months -remains at [facility] for hospital delirium  Hyperlipidemia -Continue statin  Goals of Care -palliative consult-->updated code status to DNR after conversations with daughter and family  Disposition Plan:   Home vs SNF when bed available Family Communication:  No  Family at bedside  Consultants:  none  Code Status:  DNR  DVT Prophylaxis:  [place] Lovenox   Procedures: As Listed in Progress Note Above  Antibiotics: None  Subjective: Pt remains stable, No complaints.  Patient denies fevers, chills, headache, chest pain, dyspnea, nausea, vomiting, diarrhea, abdominal pain, dysuria   Objective: Vitals:   05/23/19 0604 05/23/19 1630 05/23/19 2148 05/24/19 0544  BP: (!) 96/58 (!) 155/78 124/63 (!) 109/56  Pulse: 80 99 83 89  Resp: 15 18 16 15   Temp:  98 F (36.7 C)    TempSrc:  Oral    SpO2: 93% 93% 92% 95%  Weight:      Height:        Intake/Output Summary (Last 24 hours) at 05/24/2019 1420 Last data filed at 05/24/2019 0900 Gross per 24 hour  Intake 240 ml  Output --  Net 240 ml   Weight change:  Exam:   General:  Pt is alert, not in acute distress, cooperative.   HEENT: No icterus, No thrush, No neck mass, /AT  Cardiovascular: normal S1/S2, no  rubs, no gallops  Respiratory: diminished BS but no wheeze  Abdomen: Soft/+BS, non tender, non distended, no guarding  Extremities: No edema, No lymphangitis, No petechiae, No rashes, no synovitis  Data Reviewed: I have personally reviewed following labs and imaging studies Basic Metabolic Panel: Recent Labs  Lab 05/18/19 0539 05/19/19 0459 05/24/19 0524  NA 145 141  --   K 4.1 4.0  --   CL 112* 109  --   CO2 23 23  --   GLUCOSE 66* 72  --   BUN 9 10  --   CREATININE 0.74 0.71 0.70  CALCIUM 8.3* 8.3*  --   MG 1.9 2.0  --    Liver Function Tests: No results for input(s): AST, ALT, ALKPHOS, BILITOT, PROT, ALBUMIN in the last 168 hours. No results for input(s): LIPASE, AMYLASE in the last 168 hours. No results for input(s): AMMONIA in the last 168 hours. Coagulation Profile: No results for input(s): INR, PROTIME in the last 168 hours. CBC: No results for input(s): WBC, NEUTROABS, HGB, HCT, MCV, PLT in the last 168 hours. Cardiac Enzymes: No results for input(s): CKTOTAL, CKMB, CKMBINDEX, TROPONINI in the last 168 hours. BNP: Invalid input(s): POCBNP CBG: No results for input(s): GLUCAP in the last 168 hours. HbA1C: No results for input(s): HGBA1C in the last 72 hours. Urine analysis:    Component Value Date/Time   COLORURINE YELLOW 05/17/2019 0020   APPEARANCEUR HAZY (A) 05/17/2019 0020   LABSPEC 1.013 05/17/2019 0020   PHURINE 5.0 05/17/2019 0020   GLUCOSEU NEGATIVE 05/17/2019 0020   HGBUR LARGE (A) 05/17/2019 0020   BILIRUBINUR NEGATIVE 05/17/2019 0020   KETONESUR NEGATIVE 05/17/2019 0020   PROTEINUR 30 (A) 05/17/2019 0020   NITRITE NEGATIVE 05/17/2019  0020   LEUKOCYTESUR TRACE (A) 05/17/2019 0020   Sepsis Labs: @LABRCNTIP (procalcitonin:4,lacticidven:4) ) Recent Results (from the past 240 hour(s))  SARS Coronavirus 2 (CEPHEID - Performed in [place] hospital lab), Hosp Order     Status: None   Collection Time: 05/17/19 12:15 AM   Specimen:  Nasopharyngeal Swab  Result Value Ref Range Status   SARS Coronavirus 2 NEGATIVE NEGATIVE Final    Comment: (NOTE) If result is NEGATIVE SARS-CoV-2 target nucleic acids are NOT DETECTED. The SARS-CoV-2 RNA is generally detectable in upper and lower  respiratory specimens during the acute phase of infection. The lowest  concentration of SARS-CoV-2 viral copies this assay can detect is 250  copies / mL. A negative result does not preclude SARS-CoV-2 infection  and should not be used as the sole basis for treatment or other  patient management decisions.  A negative result may occur with  improper specimen collection / handling, submission of specimen other  than nasopharyngeal swab, presence of viral mutation(s) within the  areas targeted by this assay, and inadequate number of viral copies  (<250 copies / mL). A negative result must be combined with clinical  observations, patient history, and epidemiological information. If result is POSITIVE SARS-CoV-2 target nucleic acids are DETECTED. The SARS-CoV-2 RNA is generally detectable in upper and lower  respiratory specimens dur ing the acute phase of infection.  Positive  results are indicative of active infection with SARS-CoV-2.  Clinical  correlation with patient history and other diagnostic information is  necessary to determine patient infection status.  Positive results do  not rule out bacterial infection or co-infection with other viruses. If result is PRESUMPTIVE POSTIVE SARS-CoV-2 nucleic acids MAY BE PRESENT.   A presumptive positive result was obtained on the submitted specimen  and confirmed on repeat testing.  While 2019 novel coronavirus  (SARS-CoV-2) nucleic acids may be present in the submitted sample  additional confirmatory testing may be necessary for epidemiological  and / or clinical management purposes  to differentiate between  SARS-CoV-2 and other Sarbecovirus currently known to infect humans.  If clinically  indicated additional testing with an alternate test  methodology [phone]) is advised. The SARS-CoV-2 RNA is generally  detectable in upper and lower respiratory sp ecimens during the acute  phase of infection. The expected result is Negative. Fact Sheet for Patients:  StrictlyIdeas.no Fact Sheet for Healthcare Providers: BankingDealers.co.za This test is not yet approved or cleared by the [place] FDA and has been authorized for detection and/or diagnosis of SARS-CoV-2 by FDA under an Emergency Use Authorization (EUA).  This EUA will remain in effect (meaning this test can be used) for the duration of the COVID-19 declaration under Section 564(b)(1) of the Act, 21 U.S.C. section 360bbb-3(b)(1), unless the authorization is terminated or revoked sooner. Performed at [facility], [address], [place], [place] [address]     Scheduled Meds:  carvedilol  3.125 mg Oral BID WC   enoxaparin (LOVENOX) injection  30 mg Subcutaneous Q24H   feeding supplement (ENSURE ENLIVE)  237 mL Oral BID BM   LORazepam  0.5 mg Oral Once   multivitamin with minerals  1 tablet Oral Daily   Continuous Infusions:  Procedures/Studies: Dg Chest 2 View  Result Date: 05/06/2019 CLINICAL DATA:  Altered mental status.  Involuntary commitment. EXAM: CHEST - 2 VIEW COMPARISON:  Chest radiograph 06/21/2017 FINDINGS: Stable cardiac and mediastinal contours. Pulmonary hyperinflation. Emphysematous changes. No large area of pulmonary consolidation. No pleural effusion or pneumothorax. IMPRESSION: No  acute cardiopulmonary process. Electronically Signed   By: Annia Beltrew  Davis M.D.   On: 05/06/2019 19:46    Wayne Wicklund Laural BenesJohnson, MD  [facility] How to contact the [facility] Attending or Consulting provider 7A - 7P or covering provider during after hours 7P -7A, for this patient?  1. Check the care team in [facility] and look for a) attending/consulting [facility] provider listed and b)  the [facility] team listed 2. Log into [email or web address] and use [place]'s universal password to access. If you do not have the password, please contact the hospital operator. 3. Locate the [facility] provider you are looking for under [facility] and page to a number that you can be directly reached. 4. If you still have difficulty reaching the provider, please page the [facility] (Director on Call) for the Hospitalists listed on amion for assistance.  05/24/2019, 2:20 PM   LOS: 6 days

## 2019-05-24 NOTE — Care Management Important Message (Signed)
Important Message  Patient Details  Name: Ruth Davidson MRN: [id number] Date of Birth: 1940-11-12   Medicare Important Message Given:  Yes     Tommy Medal 05/24/2019, 1:41 PM

## 2019-05-25 MED ORDER — QUETIAPINE FUMARATE 25 MG PO TABS: 25.0000 mg | ORAL_TABLET | Freq: Two times a day (BID) | ORAL | Status: DC

## 2019-05-25 MED ORDER — QUETIAPINE FUMARATE 25 MG PO TABS
25.0000 mg | ORAL_TABLET | Freq: Every day | ORAL | Status: DC
  Administered 2019-05-25 – 2019-05-27 (×3): 25 mg via ORAL
  Filled 2019-05-25 (×5): qty 1

## 2019-05-25 NOTE — Progress Notes (Signed)
PROGRESS NOTE  Ruth Davidson ZDG:[id number] DOB: [birth year] DOA: 05/16/2019 PCP: Rosita Fire, MD  Brief History:  78 year old female with history of coronary artery disease status post remote PCI, systolic CHF EF 59% in 5638, tobacco abuse, COPD, dementia, hypothyroidism, and hyperlipidemia presenting with poor oral intake and increasing confusion.Unfortunately, the patient is unable to provide any significant history. The patient does not know why she is in the hospital. The patient herself denies any fevers, chills, headache, chest pain, shortness breath, nausea, vomiting, diarrhea, abdominal pain. According to patient's daughter, the patient has had very poor oral intake for the last 2 weeks and continues to smoke. In addition, the patient has refused to take any medications for several months. There are no reports of fevers, chills, chest pain, shortness breath, vomiting, diarrhea, abdominal pain. Upon presentation, the patient was afebrile hemodynamically stable saturating 95% room air. Potassium was noted to be 2.4 with serum creatinine 0.93. Lactic acid was 1.1. UA was negative for pyuria. The patient was admitted for failure to thrive and dehydration.The patient was placed on IV fluids and her potassium was repleted. Her renal function improved and electrolytes were optimized. Physical therapy was consulted and recommended 24-hour supervision and possible skilled nursing facility. Unfortunately, the patient continued to have poor oral intake. The patient's daughter was updated throughout the hospitalization. Unfortunately, the patient's insurance company denied placement into a skilled nursing facility. I subsequently had a peer-to-peer discussion with the patient's insurance company representative. Unfortunately, authorization could not be obtained despite the peer to peer review. Subsequently discussed with the patient's daughter (on 05/19/19)after the peer to peer  review and updated her on the situation. I told the patient's daughter that the patient is medically stable. I offered home health physical therapy, RN, and social work with the assistance of our case Freight forwarder. Daughter appealed insurance denial, and she refused to take patient home. Therefore, the patient remained in the hospital until the situation was worked out. APS referral was sent. On 05/22/19, the daughter decided it was in patient's best interest for her to take patient home.  Therefore, HH PT, RN, SW and Aide were set up. However, the daughter changes her mind frequently and refused to take patient home again on 05/23/19.  Work-up during the hospitalization did not reveal any reversible causes of the patient's intermittent agitation. Therefore, this was likely attributed to the patient's continued cognitive decline from her dementia. It was noted from blood work that the patient had hyperthyroidism. Thyroid-stimulating antibodies were ordered and are negative. Patient will need RAIU study performed as outpatient to clarify etiology of hyperthyroidism. Palliative medicine was consulted. After discussion with the patient's family, the patient was transitioned to DNR status.   Assessment/Plan: Failure to thrive -Check serum V56--433 -Folic IRJJ--88.4 -ZYS--0.630 -Free T4--1.55 -PT evaluation-->SNF -UA--no significant pyuria -intermittentlyrefuse to eat or take medications; has poor po intake -awaiting SNF placement--denied by insurance company despite peer to peer review/discussion on 05/19/19 -set up [place], [place], [facility] if cannot be placed in ALF or SNF -daughter refuses to take patient home; appealing insurance denial. -05/22/19--daughter changed mind and agrees to take patient home -05/23/19--daughter refuses to take patient home   Hyperthyroidism -check thyroid receptor antibodies (thyroid stimulating immunoglobulin)-->normal -obtain RAIU as outpatient -TSH--0.057 -Free  T4--1.55  Hypokalemia -Repleted -Magnesium 1.9  Chronic systolic and diastolic CHF -1/60/1093 echo EF 15-20%, diffuse HK, grade 1 DD, dilated IVC, trivial TR -Appears clinically dry--improved after fluids -Daily weights--stable -restart  lasix to 20 mg daily after discharge at lower dose as pt has very poor po intake -restart coreg  COPD -Stable on room air  Tobacco abuse -Tobacco cessation discussed -no desire to quit  Coronary artery disease -No chest pain presently -Personally reviewed EKG--sinus rhythm, nonspecific T wave change  Severe protein calorie malnutrition -Continue Ensure  Dementia without behavior disturbance -daughter relates progressive cognitive decline over last 6 months -remains at [facility] for hospital delirium - Seroquel 25 mg QHS ordered for delirium  Hyperlipidemia -Continue statin  Goals of Care -palliative consult-->updated code status to DNR after conversations with daughter and family  Disposition Plan:   Home vs SNF when bed available Family Communication:  No  Family at bedside  Consultants:  none  Code Status:  DNR  DVT Prophylaxis:  [place] Lovenox   Procedures: As Listed in Progress Note Above  Antibiotics: None  Subjective: Pt remains stable, No complaints.  Patient denies fevers, chills, headache, chest pain, dyspnea, nausea, vomiting, diarrhea, abdominal pain, dysuria   Objective: Vitals:   05/24/19 2011 05/24/19 2055 05/25/19 0603 05/25/19 1320  BP:  (!) 120/53 100/61 (!) 105/53  Pulse:  100 88 83  Resp:  16 15 16   Temp:   97.6 F (36.4 C) 98.4 F (36.9 C)  TempSrc:   Oral Oral  SpO2: 95% 95% 97% 95%  Weight:      Height:        Intake/Output Summary (Last 24 hours) at 05/25/2019 1601 Last data filed at 05/24/2019 1803 Gross per 24 hour  Intake 120 ml  Output --  Net 120 ml   Weight change:  Exam:   General:  Pt is sleeping but arousable, not in acute distress, cooperative.   HEENT: No  icterus, No thrush, No neck mass, [place]/AT  Cardiovascular: normal S1/S2, no rubs, no gallops  Respiratory: diminished BS but no wheeze  Abdomen: Soft/+BS, non tender, non distended, no guarding  Extremities: No edema, No lymphangitis, No petechiae, No rashes, no synovitis  Data Reviewed: I have personally reviewed following labs and imaging studies Basic Metabolic Panel: Recent Labs  Lab 05/19/19 0459 05/24/19 0524  NA 141  --   K 4.0  --   CL 109  --   CO2 23  --   GLUCOSE 72  --   BUN 10  --   CREATININE 0.71 0.70  CALCIUM 8.3*  --   MG 2.0  --    Liver Function Tests: No results for input(s): AST, ALT, ALKPHOS, BILITOT, PROT, ALBUMIN in the last 168 hours. No results for input(s): LIPASE, AMYLASE in the last 168 hours. No results for input(s): AMMONIA in the last 168 hours. Coagulation Profile: No results for input(s): INR, PROTIME in the last 168 hours. CBC: No results for input(s): WBC, NEUTROABS, HGB, HCT, MCV, PLT in the last 168 hours. Cardiac Enzymes: No results for input(s): CKTOTAL, CKMB, CKMBINDEX, TROPONINI in the last 168 hours. BNP: Invalid input(s): POCBNP CBG: No results for input(s): GLUCAP in the last 168 hours. HbA1C: No results for input(s): HGBA1C in the last 72 hours. Urine analysis:    Component Value Date/Time   COLORURINE YELLOW 05/17/2019 0020   APPEARANCEUR HAZY (A) 05/17/2019 0020   LABSPEC 1.013 05/17/2019 0020   PHURINE 5.0 05/17/2019 0020   GLUCOSEU NEGATIVE 05/17/2019 0020   HGBUR LARGE (A) 05/17/2019 0020   BILIRUBINUR NEGATIVE 05/17/2019 0020   KETONESUR NEGATIVE 05/17/2019 0020   PROTEINUR 30 (A) 05/17/2019 0020   NITRITE NEGATIVE  05/17/2019 0020   LEUKOCYTESUR TRACE (A) 05/17/2019 0020   Sepsis Labs: @LABRCNTIP (procalcitonin:4,lacticidven:4) ) Recent Results (from the past 240 hour(s))  SARS Coronavirus 2 (CEPHEID - Performed in [facility] lab), Hosp Order     Status: None   Collection Time: 05/17/19 12:15 AM    Specimen: Nasopharyngeal Swab  Result Value Ref Range Status   SARS Coronavirus 2 NEGATIVE NEGATIVE Final    Comment: (NOTE) If result is NEGATIVE SARS-CoV-2 target nucleic acids are NOT DETECTED. The SARS-CoV-2 RNA is generally detectable in upper and lower  respiratory specimens during the acute phase of infection. The lowest  concentration of SARS-CoV-2 viral copies this assay can detect is 250  copies / mL. A negative result does not preclude SARS-CoV-2 infection  and should not be used as the sole basis for treatment or other  patient management decisions.  A negative result may occur with  improper specimen collection / handling, submission of specimen other  than nasopharyngeal swab, presence of viral mutation(s) within the  areas targeted by this assay, and inadequate number of viral copies  (<250 copies / mL). A negative result must be combined with clinical  observations, patient history, and epidemiological information. If result is POSITIVE SARS-CoV-2 target nucleic acids are DETECTED. The SARS-CoV-2 RNA is generally detectable in upper and lower  respiratory specimens dur ing the acute phase of infection.  Positive  results are indicative of active infection with SARS-CoV-2.  Clinical  correlation with patient history and other diagnostic information is  necessary to determine patient infection status.  Positive results do  not rule out bacterial infection or co-infection with other viruses. If result is PRESUMPTIVE POSTIVE SARS-CoV-2 nucleic acids MAY BE PRESENT.   A presumptive positive result was obtained on the submitted specimen  and confirmed on repeat testing.  While 2019 novel coronavirus  (SARS-CoV-2) nucleic acids may be present in the submitted sample  additional confirmatory testing may be necessary for epidemiological  and / or clinical management purposes  to differentiate between  SARS-CoV-2 and other Sarbecovirus currently known to infect humans.  If  clinically indicated additional testing with an alternate test  methodology [phone]([id number]) is advised. The SARS-CoV-2 RNA is generally  detectable in upper and lower respiratory sp ecimens during the acute  phase of infection. The expected result is Negative. Fact Sheet for Patients:  BoilerBrush.com.cyhttps://www.fda.gov/media/136312/download Fact Sheet for Healthcare Providers: https://pope.com/https://www.fda.gov/media/136313/download This test is not yet approved or cleared by the [place] FDA and has been authorized for detection and/or diagnosis of SARS-CoV-2 by FDA under an Emergency Use Authorization (EUA).  This EUA will remain in effect (meaning this test can be used) for the duration of the COVID-19 declaration under Section 564(b)(1) of the Act, 21 U.S.C. section 360bbb-3(b)(1), unless the authorization is terminated or revoked sooner. Performed at [facility], [address] [place], [place] [address]   Novel Coronavirus, NAA (hospital order; send-out to ref lab)     Status: None   Collection Time: 05/22/19  4:11 PM   Specimen: Nasopharyngeal Swab; Respiratory  Result Value Ref Range Status   SARS-CoV-2, NAA NOT DETECTED NOT DETECTED Final    Comment: (NOTE) This test was developed and its performance characteristics determined by [facility]. This test has not been FDA cleared or approved. This test has been authorized by FDA under an Emergency Use Authorization (EUA). This test is only authorized for the duration of time the declaration that circumstances exist justifying the authorization of the emergency use of in  vitro diagnostic tests for detection of SARS-CoV-2 virus and/or diagnosis of COVID-19 infection under section 564(b)(1) of the Act, 21 U.S.C. 253GUY-4(I)(3360bbb-3(b)(1), unless the authorization is terminated or revoked sooner. When diagnostic testing is negative, the possibility of a false negative result should be considered in the context of a patient's recent exposures and the presence  of clinical signs and symptoms consistent with COVID-19. An individual without symptoms of COVID-19 and who is not shedding SARS-CoV-2 virus would expect to have a negative (not detected) result in this assay. Performed  At: [facility] [place] [address] [place], [place] [id number] Jolene SchimkeNagendra Sanjai MD OV:[id number]:[phone]    Coronavirus Source NASOPHARYNGEAL  Final    Comment: Performed at [facility], [address] [place], [place] [address]    Scheduled Meds:  carvedilol  3.125 mg Oral BID WC   enoxaparin (LOVENOX) injection  30 mg Subcutaneous Q24H   feeding supplement (ENSURE ENLIVE)  237 mL Oral BID BM   LORazepam  0.5 mg Oral Once   multivitamin with minerals  1 tablet Oral Daily   QUEtiapine  25 mg Oral QHS   Continuous Infusions:  Procedures/Studies: Dg Chest 2 View  Result Date: 05/06/2019 CLINICAL DATA:  Altered mental status.  Involuntary commitment. EXAM: CHEST - 2 VIEW COMPARISON:  Chest radiograph 06/21/2017 FINDINGS: Stable cardiac and mediastinal contours. Pulmonary hyperinflation. Emphysematous changes. No large area of pulmonary consolidation. No pleural effusion or pneumothorax. IMPRESSION: No acute cardiopulmonary process. Electronically Signed   By: Annia Beltrew  Davis M.D.   On: 05/06/2019 19:46    Chanler Schreiter Laural BenesJohnson, MD  [facility] How to contact the [facility] Attending or Consulting provider 7A - 7P or covering provider during after hours 7P -7A, for this patient?  1. Check the care team in [facility] and look for a) attending/consulting TR[facility] provider listed and b) the [facility] team listed 2. Log into [email or web address] and use [place] universal password to access. If you do not have the password, please contact the hospital operator. 3. Locate the [facility] provider you are looking for under [facility] and page to a number that you can be directly reached. 4. If you still have difficulty reaching the provider, please page the [facility] (Director on Call) for the Hospitalists  listed on amion for assistance.  05/25/2019, 4:01 PM   LOS: 7 days

## 2019-05-25 NOTE — Progress Notes (Signed)
Nutrition Follow-up  DOCUMENTATION CODES:   Severe malnutrition in context of acute illness/injury  INTERVENTION:  Continue regular diet   Ensure Enlive po BID, each supplement provides 350 kcal and 20 grams of protein    NUTRITION DIAGNOSIS:   Severe Malnutrition related to acute change in meal intake/mild worsening of dementia illness on chronic dementia and cardiac cachexia) as evidenced by energy intake < or equal to 50% for > or equal to 5 days, severe loss muscle and fat mass.   GOAL:  (meet needs as able based on pt wishes to limit agressive intervention nutritionally)  MONITOR: Po intake, labs and wt trends    REASON FOR ASSESSMENT:   Malnutrition Screening Tool    ASSESSMENT: Patient is an underweight 78 yo female with history of dementia, COPD (emphysema), CHF, Cardiac cachexia. Talked with her daughter to obtain nutrition history.   Patient stopped eating > 2 weeks ago. At home she has been drinking regular Sunkist and coffee. No oral nutrition supplements or milk.   Patient has a Actuary with her today. Continues to have poor oral intake. Breakfast tray is here still at around 11:30 and untouched. Yesterday NT here and says she did not eat at breakfast or lunch. Patient unable to communicate any food that she would be willing to eat. She has consumed 0% of any meals.  Patient breakfast and lunch tray are in the room and untouched along with an unopened bottle of Ensure. Palliative medicine notes that patient does not want aggressive nutrition intervention such as tube feeding.  Daughter is POA.   Weight 99 lb (45 kg) at Dr Josephine Cables office in June. Initial assessment pt wt 98 lb (44.5 kg). In the past 3 years her weight has ranged mostly between 39-41 kg.   Labs reviewed:  BMP Latest Ref Rng & Units 05/24/2019 05/19/2019 05/18/2019  Glucose 70 - 99 mg/dL - 72 66(L)  BUN 8 - 23 mg/dL - 10 9  Creatinine 0.44 - 1.00 mg/dL 0.70 0.71 0.74  BUN/Creat Ratio 6 - 22 (calc) - -  -  Sodium 135 - 145 mmol/L - 141 145  Potassium 3.5 - 5.1 mmol/L - 4.0 4.1  Chloride 98 - 111 mmol/L - 109 112(H)  CO2 22 - 32 mmol/L - 23 23  Calcium 8.9 - 10.3 mg/dL - 8.3(L) 8.3(L)     Medications reviewed and include: Ensure Enlive BID, Nicoderm.   NUTRITION - FOCUSED PHYSICAL EXAM:  Severe depletion of orbital and thoraic fat and severe loss of muscle temples, acromion and clavicles   Diet Order:   Diet Order            Diet regular Room service appropriate? Yes; Fluid consistency: Thin  Diet effective now              EDUCATION NEEDS:  Education needs have been addressed    Skin:  Skin Assessment: Reviewed RN Assessment  Last BM:  unknown  Height:   Ht Readings from Last 1 Encounters:  05/17/19 5\' 3"  (1.6 m)    Weight:   Wt Readings from Last 1 Encounters:  05/17/19 44.5 kg    Ideal Body Weight:  52 kg  BMI:  Body mass index is 17.38 kg/m.  Estimated Nutritional Needs:   Kcal:  6237-6283  Protein:  63-68 gr  Fluid:  >1100 ml daily   Colman Cater MS,RD,CSG,LDN Office: [phone] Pager: [phone]

## 2019-05-25 NOTE — Progress Notes (Signed)
Patient is very confused and agitated. Patient continuously trying to get out of bed and leave the floor. Safety sitter ordered. Will continue to monitor patient.

## 2019-05-26 NOTE — Progress Notes (Signed)
Physical Therapy Treatment Patient Details Name: Ruth SagesJudy M Paulette MRN: [id number] DOB: [birth year] Today's Date: 05/26/2019    History of Present Illness Ruth Davidson is a 78 y.o. female with medical history significant of CAD, history of MI, cardiac cachexia, chronic diastolic CHF, COPD, tobacco use, dementia, medical treatment noncompliance, hypothyroidism who is coming to the emergency department brought by her daughter due to mild worsening in dementia and lack of appetite for the past 2 weeks.  The patient is unable to provide further history.    PT Comments    Patient demonstrates slightly labored movement for sitting up at bedside, able to ambulate to bathroom to sit on commode leaning on nearby objects for support and hand held assist.  Patient required use of RW for gait training to improve safety, ambulated in hallway without loss of balance and limited secondary to c/o fatigue.  Patient tolerated sitting up in chair after therapy and encouraged to eat her lunch - RN notified.  Patient will benefit from continued physical therapy in hospital and recommended venue below to increase strength, balance, endurance for safe ADLs and gait.    Follow Up Recommendations  SNF;Supervision - Intermittent     Equipment Recommendations  Rolling walker with 5" wheels    Recommendations for Other Services       Precautions / Restrictions Precautions Precautions: Fall Restrictions Weight Bearing Restrictions: No    Mobility  Bed Mobility Overal bed mobility: Needs Assistance Bed Mobility: Supine to Sit     Supine to sit: Supervision     General bed mobility comments: slightly labored movement  Transfers Overall transfer level: Needs assistance Equipment used: Rolling walker (2 wheeled);None;1 person hand held assist Transfers: Sit to/from [employer]Stand;Stand Pivot Transfers Sit to Stand: Min guard Stand pivot transfers: Min guard          Ambulation/Gait Ambulation/Gait  assistance: Min guard;Min assist Gait Distance (Feet): 75 Feet Assistive device: Rolling walker (2 wheeled) Gait Pattern/deviations: Decreased step length - right;Decreased step length - left;Decreased stride length Gait velocity: decreased   General Gait Details: slow slightly labored cadence with decreased step/stride length, no loss of balance using RW, tends to lean on nearby objects for support when not using an AD   Stairs             Wheelchair Mobility    Modified Rankin (Stroke Patients Only)       Balance Overall balance assessment: Needs assistance Sitting-balance support: Feet supported;No upper extremity supported Sitting balance-Leahy Scale: Good Sitting balance - Comments: seated at bedside   Standing balance support: During functional activity;No upper extremity supported   Standing balance comment: fair/poor without AD, fair using RW                            Cognition Arousal/Alertness: Awake/alert Behavior During Therapy: WFL for tasks assessed/performed Overall Cognitive Status: History of cognitive impairments - at baseline                                        Exercises      General Comments        Pertinent Vitals/Pain Pain Assessment: No/denies pain    Home Living                      Prior Function  PT Goals (current goals can now be found in the care plan section) Acute Rehab PT Goals Patient Stated Goal: return home with family to assist PT Goal Formulation: With patient Time For Goal Achievement: 06/01/19 Potential to Achieve Goals: Good Progress towards PT goals: Progressing toward goals    Frequency    Min 3X/week      PT Plan Current plan remains appropriate    Co-evaluation              AM-PAC PT "6 Clicks" Mobility   Outcome Measure  Help needed turning from your back to your side while in a flat bed without using bedrails?: None Help needed moving  from lying on your back to sitting on the side of a flat bed without using bedrails?: None Help needed moving to and from a bed to a chair (including a wheelchair)?: A Little Help needed standing up from a chair using your arms (e.g., wheelchair or bedside chair)?: A Little Help needed to walk in hospital room?: A Little Help needed climbing 3-5 steps with a railing? : A Lot 6 Click Score: 19    End of Session Equipment Utilized During Treatment: Gait belt Activity Tolerance: Patient tolerated treatment well;Patient limited by fatigue Patient left: with call bell/phone within reach;in chair;with chair alarm set Nurse Communication: Mobility status PT Visit Diagnosis: Unsteadiness on feet (R26.81);Other abnormalities of gait and mobility (R26.89);Muscle weakness (generalized) (M62.81)     Time: 2671-2458 PT Time Calculation (min) (ACUTE ONLY): 28 min  Charges:  $Gait Training: 8-22 mins $Therapeutic Activity: 8-22 mins                     2:33 PM, 05/26/19 Lonell Grandchild, MPT Physical Therapist with [facility] 336 [phone] office [phone] mobile phone

## 2019-05-26 NOTE — TOC Progression Note (Signed)
Transition of Care [facility]) - Progression Note    Patient Details  Name: Ruth Davidson MRN: [id number] Date of Birth: [birth year]  [facility]) CM/SW Contact  Boneta Lucks, RN Phone Number: 05/26/2019, 2:17 PM  Clinical Narrative:   Lynnae Sandhoff called from [employer]  to see if patient was still in the hospital. She had a message from [facility], attorney stating he had the Money.  She is trying to get back in touch with him to get the details. She will be in touch with CM on Monday in hopes to get patient transferred.     Expected Discharge Plan: Skilled Nursing Facility Barriers to Discharge: Barriers Resolved  Expected Discharge Plan and Services Expected Discharge Plan: Skilled Nursing Facility     HH Arranged: RN, PT, Nurse's Aide, Social Work [facility] Agency: [place] Date [place]: 05/23/19 Time [place]: [place] Representative spoke with at [place]: [place]    Readmission Risk Interventions Readmission Risk Prevention Plan 05/22/2019  Transportation Screening Complete  Home Care Screening Complete  Medication Review (RN CM) Complete  Some recent data might be hidden

## 2019-05-26 NOTE — Progress Notes (Signed)
PROGRESS NOTE  Berneta SagesJudy M Chipps ZOX:[id number] DOB: [birth year] DOA: 05/16/2019 PCP: Avon GullyFanta, Tesfaye, MD  Brief History:  78 year old female with history of coronary artery disease status post remote PCI, systolic CHF EF 20% in 2018, tobacco abuse, COPD, dementia, hypothyroidism, and hyperlipidemia presenting with poor oral intake and increasing confusion.Unfortunately, the patient is unable to provide any significant history. The patient does not know why she is in the hospital. The patient herself denies any fevers, chills, headache, chest pain, shortness breath, nausea, vomiting, diarrhea, abdominal pain. According to patient's daughter, the patient has had very poor oral intake for the last 2 weeks and continues to smoke. In addition, the patient has refused to take any medications for several months. There are no reports of fevers, chills, chest pain, shortness breath, vomiting, diarrhea, abdominal pain. Upon presentation, the patient was afebrile hemodynamically stable saturating 95% room air. Potassium was noted to be 2.4 with serum creatinine 0.93. Lactic acid was 1.1. UA was negative for pyuria. The patient was admitted for failure to thrive and dehydration.The patient was placed on IV fluids and her potassium was repleted. Her renal function improved and electrolytes were optimized. Physical therapy was consulted and recommended 24-hour supervision and possible skilled nursing facility. Unfortunately, the patient continued to have poor oral intake. The patient's daughter was updated throughout the hospitalization. Unfortunately, the patient's insurance company denied placement into a skilled nursing facility. I subsequently had a peer-to-peer discussion with the patient's insurance company representative. Unfortunately, authorization could not be obtained despite the peer to peer review. Subsequently discussed with the patient's daughter (on 05/19/19)after the peer to peer  review and updated her on the situation. I told the patient's daughter that the patient is medically stable. I offered home health physical therapy, RN, and social work with the assistance of our case Production designer, theatre/television/filmmanager. Daughter appealed insurance denial, and she refused to take patient home. Therefore, the patient remained in the hospital until the situation was worked out. APS referral was sent. On 05/22/19, the daughter decided it was in patient's best interest for her to take patient home.  Therefore, HH PT, RN, SW and Aide were set up. However, the daughter changes her mind frequently and refused to take patient home again on 05/23/19.  Work-up during the hospitalization did not reveal any reversible causes of the patient's intermittent agitation. Therefore, this was likely attributed to the patient's continued cognitive decline from her dementia. It was noted from blood work that the patient had hyperthyroidism. Thyroid-stimulating antibodies were ordered and are negative. Patient will need RAIU study performed as outpatient to clarify etiology of hyperthyroidism. Palliative medicine was consulted. After discussion with the patient's family, the patient was transitioned to DNR status.  Assessment/Plan: Failure to thrive -Check serum B12--265 -Folic acid--16.1 -TSH--0.057 -Free T4--1.55 -PT evaluation-->SNF -UA--no significant pyuria -intermittentlyrefuse to eat or take medications; has poor po intake -awaiting SNF placement--denied by insurance company despite peer to peer review/discussion on 05/19/19 -set up HHPT, HHRN, HHSW if cannot be placed in ALF or SNF -daughter refuses to take patient home; appealing insurance denial. -05/22/19--daughter changed mind and agrees to take patient home -05/23/19--daughter refuses to take patient home   Hyperthyroidism -check thyroid receptor antibodies (thyroid stimulating immunoglobulin)-->normal -obtain RAIU as outpatient with  endocrinologist -TSH--0.057 -Free T4--1.55  Hypokalemia -Repleted -Magnesium 1.9  Chronic systolic and diastolic CHF -06/22/2017 echo EF 15-20%, diffuse HK, grade 1 DD, dilated IVC, trivial TR -Appears clinically dry--improved after fluids -Daily weights--stable -  plan to restart lasix to 20 mg daily after discharge at lower dose as pt has very poor po intake -continue coreg  COPD -Stable on room air  Tobacco abuse -Tobacco cessation discussed -Pt not allowed to smoke in hospital.  Will offer nicotine patch.   Coronary artery disease -No chest pain presently -Personally reviewed EKG--sinus rhythm, nonspecific T wave change  Severe protein calorie malnutrition -Continue Ensure  Dementia without behavior disturbance -daughter relates progressive cognitive decline over last 6 months -remains at [place] for hospital delirium - Seroquel 25 mg QHS ordered for intermittent delirium  Hyperlipidemia -Continue statin  Goals of Care -palliative consult-->updated code status to DNR after conversations with daughter and family  Disposition Plan:   SNF when bed available likely next week Family Communication:  No  Family at bedside  Consultants:  none  Code Status:  DNR  DVT Prophylaxis:  [place] Lovenox   Procedures: As Listed in Progress Note Above  Antibiotics: None  Subjective: Pt has intermittent confusion but no specific complaints.   Objective: Vitals:   05/25/19 1320 05/25/19 2004 05/25/19 2058 05/26/19 0629  BP: (!) 105/53  123/69 102/63  Pulse: 83  76 81  Resp: 16  17 17   Temp: 98.4 F (36.9 C)  98.2 F (36.8 C) 98.1 F (36.7 C)  TempSrc: Oral     SpO2: 95% 96% 96% 97%  Weight:      Height:        Intake/Output Summary (Last 24 hours) at 05/26/2019 1659 Last data filed at 05/25/2019 1855 Gross per 24 hour  Intake 120 ml  Output --  Net 120 ml   Weight change:  Exam:   General:  Pt is arousable, not in acute distress, cooperative.    HEENT: No icterus, No thrush, No neck mass, [place]/AT  Cardiovascular: normal S1/S2, no rubs, no gallops  Respiratory: diminished BS but no wheeze  Abdomen: Soft/+BS, non tender, non distended, no guarding  Extremities: No edema, no cyanosis  Data Reviewed: I have personally reviewed following labs and imaging studies Basic Metabolic Panel: Recent Labs  Lab 05/24/19 0524  CREATININE 0.70   Liver Function Tests: No results for input(s): AST, ALT, ALKPHOS, BILITOT, PROT, ALBUMIN in the last 168 hours. No results for input(s): LIPASE, AMYLASE in the last 168 hours. No results for input(s): AMMONIA in the last 168 hours. Coagulation Profile: No results for input(s): INR, PROTIME in the last 168 hours. CBC: No results for input(s): WBC, NEUTROABS, HGB, HCT, MCV, PLT in the last 168 hours. Cardiac Enzymes: No results for input(s): CKTOTAL, CKMB, CKMBINDEX, TROPONINI in the last 168 hours. BNP: Invalid input(s): POCBNP CBG: No results for input(s): GLUCAP in the last 168 hours. HbA1C: No results for input(s): HGBA1C in the last 72 hours. Urine analysis:    Component Value Date/Time   COLORURINE YELLOW 05/17/2019 0020   APPEARANCEUR HAZY (A) 05/17/2019 0020   LABSPEC 1.013 05/17/2019 0020   PHURINE 5.0 05/17/2019 0020   GLUCOSEU NEGATIVE 05/17/2019 0020   HGBUR LARGE (A) 05/17/2019 0020   BILIRUBINUR NEGATIVE 05/17/2019 0020   KETONESUR NEGATIVE 05/17/2019 0020   PROTEINUR 30 (A) 05/17/2019 0020   NITRITE NEGATIVE 05/17/2019 0020   LEUKOCYTESUR TRACE (A) 05/17/2019 0020   Sepsis Labs: @LABRCNTIP (procalcitonin:4,lacticidven:4) ) Recent Results (from the past 240 hour(s))  SARS Coronavirus 2 (CEPHEID - Performed in [place] hospital lab), Hosp Order     Status: None   Collection Time: 05/17/19 12:15 AM   Specimen: Nasopharyngeal Swab  Result Value Ref Range Status   SARS Coronavirus 2 NEGATIVE NEGATIVE Final    Comment: (NOTE) If result is NEGATIVE SARS-CoV-2  target nucleic acids are NOT DETECTED. The SARS-CoV-2 RNA is generally detectable in upper and lower  respiratory specimens during the acute phase of infection. The lowest  concentration of SARS-CoV-2 viral copies this assay can detect is 250  copies / mL. A negative result does not preclude SARS-CoV-2 infection  and should not be used as the sole basis for treatment or other  patient management decisions.  A negative result may occur with  improper specimen collection / handling, submission of specimen other  than nasopharyngeal swab, presence of viral mutation(s) within the  areas targeted by this assay, and inadequate number of viral copies  (<250 copies / mL). A negative result must be combined with clinical  observations, patient history, and epidemiological information. If result is POSITIVE SARS-CoV-2 target nucleic acids are DETECTED. The SARS-CoV-2 RNA is generally detectable in upper and lower  respiratory specimens dur ing the acute phase of infection.  Positive  results are indicative of active infection with SARS-CoV-2.  Clinical  correlation with patient history and other diagnostic information is  necessary to determine patient infection status.  Positive results do  not rule out bacterial infection or co-infection with other viruses. If result is PRESUMPTIVE POSTIVE SARS-CoV-2 nucleic acids MAY BE PRESENT.   A presumptive positive result was obtained on the submitted specimen  and confirmed on repeat testing.  While 2019 novel coronavirus  (SARS-CoV-2) nucleic acids may be present in the submitted sample  additional confirmatory testing may be necessary for epidemiological  and / or clinical management purposes  to differentiate between  SARS-CoV-2 and other Sarbecovirus currently known to infect humans.  If clinically indicated additional testing with an alternate test  methodology [phone]([id number]) is advised. The SARS-CoV-2 RNA is generally  detectable in upper and lower  respiratory sp ecimens during the acute  phase of infection. The expected result is Negative. Fact Sheet for Patients:  BoilerBrush.com.cyhttps://www.fda.gov/media/136312/download Fact Sheet for Healthcare Providers: https://pope.com/https://www.fda.gov/media/136313/download This test is not yet approved or cleared by the [place] FDA and has been authorized for detection and/or diagnosis of SARS-CoV-2 by FDA under an Emergency Use Authorization (EUA).  This EUA will remain in effect (meaning this test can be used) for the duration of the COVID-19 declaration under Section 564(b)(1) of the Act, 21 U.S.C. section 360bbb-3(b)(1), unless the authorization is terminated or revoked sooner. Performed at [facility], [address] [place], [place] [address]   Novel Coronavirus, NAA (hospital order; send-out to ref lab)     Status: None   Collection Time: 05/22/19  4:11 PM   Specimen: Nasopharyngeal Swab; Respiratory  Result Value Ref Range Status   SARS-CoV-2, NAA NOT DETECTED NOT DETECTED Final    Comment: (NOTE) This test was developed and its performance characteristics determined by [facility]. This test has not been FDA cleared or approved. This test has been authorized by FDA under an Emergency Use Authorization (EUA). This test is only authorized for the duration of time the declaration that circumstances exist justifying the authorization of the emergency use of in vitro diagnostic tests for detection of SARS-CoV-2 virus and/or diagnosis of COVID-19 infection under section 564(b)(1) of the Act, 21 U.S.C. 956OZH-0(Q)(6360bbb-3(b)(1), unless the authorization is terminated or revoked sooner. When diagnostic testing is negative, the possibility of a false negative result should be considered in the context of a patient's recent exposures and the  presence of clinical signs and symptoms consistent with COVID-19. An individual without symptoms of COVID-19 and who is not shedding SARS-CoV-2 virus would expect to have  a negative (not detected) result in this assay. Performed  At: [facility] [address] [place], [place] [id number] Jolene SchimkeNagendra Sanjai MD WU:[id number]:[phone]    Coronavirus Source NASOPHARYNGEAL  Final    Comment: Performed at [facility], [address] [place], [place] [address]    Scheduled Meds:  carvedilol  3.125 mg Oral BID WC   enoxaparin (LOVENOX) injection  30 mg Subcutaneous Q24H   feeding supplement (ENSURE ENLIVE)  237 mL Oral BID BM   LORazepam  0.5 mg Oral Once   multivitamin with minerals  1 tablet Oral Daily   QUEtiapine  25 mg Oral QHS   Continuous Infusions:  Procedures/Studies: Dg Chest 2 View  Result Date: 05/06/2019 CLINICAL DATA:  Altered mental status.  Involuntary commitment. EXAM: CHEST - 2 VIEW COMPARISON:  Chest radiograph 06/21/2017 FINDINGS: Stable cardiac and mediastinal contours. Pulmonary hyperinflation. Emphysematous changes. No large area of pulmonary consolidation. No pleural effusion or pneumothorax. IMPRESSION: No acute cardiopulmonary process. Electronically Signed   By: Annia Beltrew  Davis M.D.   On: 05/06/2019 19:46    Nalayah Hitt Laural BenesJohnson, MD  [facility] How to contact the [facility] Attending or Consulting provider 7A - 7P or covering provider during after hours 7P -7A, for this patient?  1. Check the care team in [facility] and look for a) attending/consulting TR[facility] provider listed and b) the [facility] team listed 2. Log into [email or web address] and use [place]'s universal password to access. If you do not have the password, please contact the hospital operator. 3. Locate the [facility] provider you are looking for under [facility] and page to a number that you can be directly reached. 4. If you still have difficulty reaching the provider, please page the [facility] (Director on Call) for the Hospitalists listed on amion for assistance.  05/26/2019, 4:59 PM   LOS: 8 days

## 2019-05-26 NOTE — TOC Progression Note (Signed)
Transition of Care [facility]) - Progression Note    Patient Details  Name: Ruth Davidson MRN: [id number] Date of Birth: May 09, 1941  Transition of [facility]) CM/SW Contact  Shade Flood, LCSW Phone Number: 05/26/2019, 11:00 AM  Clinical Narrative:     Message left with attorney Jayme Cloud [phone]) requesting return call to discuss timeframe for pt's funds to become accessible so she can be placed. Awaiting return call. Will follow.   Expected Discharge Plan: Skilled Nursing Facility Barriers to Discharge: Barriers Resolved  Expected Discharge Plan and Services Expected Discharge Plan: [place]         Expected Discharge Date: 05/22/19                         HH Arranged: RN, PT, Nurse's Aide, Social Work [employer] Agency: [place] ([place]) Date [place]: 05/23/19 Time [place]: [place] Representative spoke with at [place]: [place] ([place]) Interventions    Readmission Risk Interventions Readmission Risk Prevention Plan 05/22/2019  Transportation Screening Complete  Home Care Screening Complete  Medication Review (RN CM) Complete  Some recent data might be hidden

## 2019-05-26 NOTE — Plan of Care (Signed)

## 2019-05-27 NOTE — Progress Notes (Signed)
Telesitter removed, patient noncompliant with safety measures. Telesitter ineffective. Patient continues to get OOB without assistance and not receptive to staff's redirection. Frequent rounds by staff. Bed/chair alarms in use, patient's needs addressed. Will continue to monitor.

## 2019-05-27 NOTE — Progress Notes (Signed)
Telesitter in place, patient sleeping at this time. Bed alarm in use. Will continue to monitor.

## 2019-05-27 NOTE — Progress Notes (Signed)
PROGRESS NOTE  Ruth Davidson ZHG:[id number] DOB: 04/04/1941 DOA: 05/16/2019 PCP: Avon GullyFanta, Tesfaye, MD  Brief History:  78 year old female with history of coronary artery disease status post remote PCI, systolic CHF EF 20% in 2018, tobacco abuse, COPD, dementia, hypothyroidism, and hyperlipidemia presenting with poor oral intake and increasing confusion.Unfortunately, the patient is unable to provide any significant history. The patient does not know why she is in the hospital. The patient herself denies any fevers, chills, headache, chest pain, shortness breath, nausea, vomiting, diarrhea, abdominal pain. According to patient's daughter, the patient has had very poor oral intake for the last 2 weeks and continues to smoke. In addition, the patient has refused to take any medications for several months. There are no reports of fevers, chills, chest pain, shortness breath, vomiting, diarrhea, abdominal pain. Upon presentation, the patient was afebrile hemodynamically stable saturating 95% room air. Potassium was noted to be 2.4 with serum creatinine 0.93. Lactic acid was 1.1. UA was negative for pyuria. The patient was admitted for failure to thrive and dehydration.The patient was placed on IV fluids and her potassium was repleted. Her renal function improved and electrolytes were optimized. Physical therapy was consulted and recommended 24-hour supervision and possible skilled nursing facility. Unfortunately, the patient continued to have poor oral intake. The patient's daughter was updated throughout the hospitalization. Unfortunately, the patient's insurance company denied placement into a skilled nursing facility. I subsequently had a peer-to-peer discussion with the patient's insurance company representative. Unfortunately, authorization could not be obtained despite the peer to peer review. Subsequently discussed with the patient's daughter (on 05/19/19)after the peer to peer  review and updated her on the situation. I told the patient's daughter that the patient is medically stable. I offered home health physical therapy, RN, and social work with the assistance of our case Production designer, theatre/television/filmmanager. Daughter appealed insurance denial, and she refused to take patient home. Therefore, the patient remained in the hospital until the situation was worked out. APS referral was sent.  On 05/22/19, the daughter decided it was in patient's best interest for her to take patient home.  Therefore, HH PT, RN, SW and Aide were set up. However, the daughter changes her mind frequently and refused to take patient home again on 05/23/19.  Work-up during the hospitalization did not reveal any reversible causes of the patient's intermittent agitation. Therefore, this was likely attributed to the patient's continued cognitive decline from her dementia. It was noted from blood work that the patient had hyperthyroidism. Thyroid-stimulating antibodies were ordered and are negative. Patient will need RAIU study performed as outpatient to clarify etiology of hyperthyroidism. Palliative medicine was consulted. After discussion with the patient's family, the patient was transitioned to DNR status.  Assessment/Plan: Failure to thrive -Check serum B12--265 -Folic acid--16.1 -TSH--0.057 -Free T4--1.55 -PT evaluation-->SNF -UA--no significant pyuria -intermittentlyrefuse to eat or take medications; has poor po intake -awaiting SNF placement--denied by insurance company despite peer to peer review/discussion on 05/19/19 -set up HHPT, HHRN, HHSW if cannot be placed in ALF or SNF -daughter refuses to take patient home; appealing insurance denial. -05/22/19--daughter changed mind and agrees to take patient home -05/23/19--daughter refuses to take patient home   Hyperthyroidism -check thyroid receptor antibodies (thyroid stimulating immunoglobulin)-->normal -obtain RAIU as outpatient with  endocrinologist -TSH--0.057 -Free T4--1.55  Hypokalemia -Repleted -Magnesium 1.9  Chronic systolic and diastolic CHF -06/22/2017 echo EF 15-20%, diffuse HK, grade 1 DD, dilated IVC, trivial TR -Appears clinically dry--improved after fluids -Daily  weights--stable -plan to restart lasix to 20 mg daily after discharge at lower dose as pt has very poor po intake -continue coreg  COPD -Stable on room air  Tobacco abuse -Tobacco cessation discussed -Pt not allowed to smoke in hospital.  Will offer nicotine patch.   Coronary artery disease -No chest pain presently -Personally reviewed EKG--sinus rhythm, nonspecific T wave change  Severe protein calorie malnutrition -Continue Ensure  Dementia without behavior disturbance -daughter relates progressive cognitive decline over last 6 months -remains at [facility] for hospital delirium - Seroquel 25 mg QHS ordered for intermittent delirium  Hyperlipidemia -Continue statin  Goals of Care -palliative consult-->updated code status to DNR after conversations with daughter and family  Disposition Plan:   SNF when bed available likely next week  Consultants:  none  Code Status:  DNR  DVT Prophylaxis:  [place] Lovenox   Procedures: As Listed in Progress Note Above  Antibiotics: None  Subjective: Pt did not eat any breakfast today.  Denies any specific complaints.    Objective: Vitals:   05/26/19 1920 05/26/19 1940 05/26/19 2108 05/27/19 0528  BP: 109/83  (!) 83/54 100/61  Pulse: 84  82 73  Resp: 19  (!) 21 17  Temp: 97.8 F (36.6 C)  97.8 F (36.6 C) 98.2 F (36.8 C)  TempSrc: Oral  Oral   SpO2: 95% 95% 97% 98%  Weight:      Height:       No intake or output data in the 24 hours ending 05/27/19 1443 Weight change:  Exam:   General:  Emaciated appearing female. Pt is arousable, not in acute distress, cooperative.   HEENT: No icterus, No thrush, No neck mass, [place]/AT  Cardiovascular: normal S1/S2, no rubs,  no gallops  Respiratory: diminished BS but no wheeze  Abdomen: Soft/+BS, non tender, non distended, no guarding  Extremities: No edema, no cyanosis  Data Reviewed: I have personally reviewed following labs and imaging studies Basic Metabolic Panel: Recent Labs  Lab 05/24/19 0524  CREATININE 0.70   Liver Function Tests: No results for input(s): AST, ALT, ALKPHOS, BILITOT, PROT, ALBUMIN in the last 168 hours. No results for input(s): LIPASE, AMYLASE in the last 168 hours. No results for input(s): AMMONIA in the last 168 hours. Coagulation Profile: No results for input(s): INR, PROTIME in the last 168 hours. CBC: No results for input(s): WBC, NEUTROABS, HGB, HCT, MCV, PLT in the last 168 hours. Cardiac Enzymes: No results for input(s): CKTOTAL, CKMB, CKMBINDEX, TROPONINI in the last 168 hours. BNP: Invalid input(s): POCBNP CBG: No results for input(s): GLUCAP in the last 168 hours. HbA1C: No results for input(s): HGBA1C in the last 72 hours. Urine analysis:    Component Value Date/Time   COLORURINE YELLOW 05/17/2019 0020   APPEARANCEUR HAZY (A) 05/17/2019 0020   LABSPEC 1.013 05/17/2019 0020   PHURINE 5.0 05/17/2019 0020   GLUCOSEU NEGATIVE 05/17/2019 0020   HGBUR LARGE (A) 05/17/2019 0020   BILIRUBINUR NEGATIVE 05/17/2019 0020   KETONESUR NEGATIVE 05/17/2019 0020   PROTEINUR 30 (A) 05/17/2019 0020   NITRITE NEGATIVE 05/17/2019 0020   LEUKOCYTESUR TRACE (A) 05/17/2019 0020   Sepsis Labs: @LABRCNTIP (procalcitonin:4,lacticidven:4) ) Recent Results (from the past 240 hour(s))  Novel Coronavirus, NAA (hospital order; send-out to ref lab)     Status: None   Collection Time: 05/22/19  4:11 PM   Specimen: Nasopharyngeal Swab; Respiratory  Result Value Ref Range Status   SARS-CoV-2, NAA NOT DETECTED NOT DETECTED Final    Comment: (NOTE) This  test was developed and its performance characteristics determined by [facility]. This test has not been FDA cleared or  approved. This test has been authorized by FDA under an Emergency Use Authorization (EUA). This test is only authorized for the duration of time the declaration that circumstances exist justifying the authorization of the emergency use of in vitro diagnostic tests for detection of SARS-CoV-2 virus and/or diagnosis of COVID-19 infection under section 564(b)(1) of the Act, 21 U.S.C. 474QVZ-5(G)(3360bbb-3(b)(1), unless the authorization is terminated or revoked sooner. When diagnostic testing is negative, the possibility of a false negative result should be considered in the context of a patient's recent exposures and the presence of clinical signs and symptoms consistent with COVID-19. An individual without symptoms of COVID-19 and who is not shedding SARS-CoV-2 virus would expect to have a negative (not detected) result in this assay. Performed  At: [facility] [place] [address] [place], [place] [id number] Jolene SchimkeNagendra Sanjai MD JJ:[id number]:[phone]    Coronavirus Source NASOPHARYNGEAL  Final    Comment: Performed at [facility], [address] [place], [place] [address]    Scheduled Meds:  carvedilol  3.125 mg Oral BID WC   enoxaparin (LOVENOX) injection  30 mg Subcutaneous Q24H   feeding supplement (ENSURE ENLIVE)  237 mL Oral BID BM   LORazepam  0.5 mg Oral Once   multivitamin with minerals  1 tablet Oral Daily   QUEtiapine  25 mg Oral QHS   Continuous Infusions:  Procedures/Studies: Dg Chest 2 View  Result Date: 05/06/2019 CLINICAL DATA:  Altered mental status.  Involuntary commitment. EXAM: CHEST - 2 VIEW COMPARISON:  Chest radiograph 06/21/2017 FINDINGS: Stable cardiac and mediastinal contours. Pulmonary hyperinflation. Emphysematous changes. No large area of pulmonary consolidation. No pleural effusion or pneumothorax. IMPRESSION: No acute cardiopulmonary process. Electronically Signed   By: Annia Beltrew  Davis M.D.   On: 05/06/2019 19:46    Fawna Cranmer Laural BenesJohnson, MD  [facility] How to  contact the [facility] Attending or Consulting provider 7A - 7P or covering provider during after hours 7P -7A, for this patient?  1. Check the care team in [facility] and look for a) attending/consulting TRH provider listed and b) the [facility] team listed 2. Log into [email or web address] and use [place]'s universal password to access. If you do not have the password, please contact the hospital operator. 3. Locate the [facility] provider you are looking for under [facility] and page to a number that you can be directly reached. 4. If you still have difficulty reaching the provider, please page the [facility] (Director on Call) for the Hospitalists listed on amion for assistance.  05/27/2019, 2:43 PM   LOS: 9 days

## 2019-05-28 LAB — NOVEL CORONAVIRUS, NAA (HOSP ORDER, SEND-OUT TO REF LAB; TAT 18-24 HRS): SARS-CoV-2, NAA: NOT DETECTED

## 2019-05-28 NOTE — Progress Notes (Signed)
PROGRESS NOTE  Ruth Davidson ZOX:[id number] DOB: 1941-09-30 DOA: 05/16/2019 PCP: Avon GullyFanta, Tesfaye, MD  Brief History:  78 year old female with history of coronary artery disease status post remote PCI, systolic CHF EF 20% in 2018, tobacco abuse, COPD, dementia, hypothyroidism, and hyperlipidemia presenting with poor oral intake and increasing confusion.Unfortunately, the patient is unable to provide any significant history. The patient does not know why she is in the hospital. The patient herself denies any fevers, chills, headache, chest pain, shortness breath, nausea, vomiting, diarrhea, abdominal pain. According to patient's daughter, the patient has had very poor oral intake for the last 2 weeks and continues to smoke. In addition, the patient has refused to take any medications for several months. There are no reports of fevers, chills, chest pain, shortness breath, vomiting, diarrhea, abdominal pain. Upon presentation, the patient was afebrile hemodynamically stable saturating 95% room air. Potassium was noted to be 2.4 with serum creatinine 0.93. Lactic acid was 1.1. UA was negative for pyuria. The patient was admitted for failure to thrive and dehydration.The patient was placed on IV fluids and her potassium was repleted. Her renal function improved and electrolytes were optimized. Physical therapy was consulted and recommended 24-hour supervision and possible skilled nursing facility. Unfortunately, the patient continued to have poor oral intake. The patient's daughter was updated throughout the hospitalization. Unfortunately, the patient's insurance company denied placement into a skilled nursing facility. I subsequently had a peer-to-peer discussion with the patient's insurance company representative. Unfortunately, authorization could not be obtained despite the peer to peer review. Subsequently discussed with the patient's daughter (on 05/19/19)after the peer to peer  review and updated her on the situation. I told the patient's daughter that the patient is medically stable. I offered home health physical therapy, RN, and social work with the assistance of our case Production designer, theatre/television/filmmanager. Daughter appealed insurance denial, and she refused to take patient home. Therefore, the patient remained in the hospital until the situation was worked out. APS referral was sent.  On 05/22/19, the daughter decided it was in patient's best interest for her to take patient home.  Therefore, HH PT, RN, SW and Aide were set up. However, the daughter changes her mind frequently and refused to take patient home again on 05/23/19.  Work-up during the hospitalization did not reveal any reversible causes of the patient's intermittent agitation. Therefore, this was likely attributed to the patient's continued cognitive decline from her dementia. It was noted from blood work that the patient had hyperthyroidism. Thyroid-stimulating antibodies were ordered and are negative. Patient will need RAIU study performed as outpatient to clarify etiology of hyperthyroidism. Palliative medicine was consulted. After discussion with the patient's family, the patient was transitioned to DNR status.  Assessment/Plan: Failure to thrive -Check serum B12--265 -Folic acid--16.1 -TSH--0.057 -Free T4--1.55 -PT evaluation-->SNF -UA--no significant pyuria -intermittentlyrefuse to eat or take medications; has poor po intake -awaiting SNF placement--denied by insurance company despite peer to peer review/discussion on 05/19/19 -set up HHPT, HHRN, HHSW if cannot be placed in ALF or SNF -daughter refuses to take patient home; appealing insurance denial. -05/22/19--daughter changed mind and agrees to take patient home -05/23/19--daughter refuses to take patient home   Hyperthyroidism -check thyroid receptor antibodies (thyroid stimulating immunoglobulin)-->normal -obtain RAIU as outpatient with  endocrinologist -TSH--0.057 -Free T4--1.55  Hypokalemia -Repleted -Magnesium 1.9  Chronic systolic and diastolic CHF -06/22/2017 echo EF 15-20%, diffuse HK, grade 1 DD, dilated IVC, trivial TR -Appears clinically dry--improved after fluids -Daily  weights--stable -plan to restart lasix to 20 mg daily after discharge at lower dose as pt has very poor po intake -continue coreg  COPD -Stable on room air  Tobacco abuse -Tobacco cessation discussed -Pt not allowed to smoke in hospital.  Will offer nicotine patch.   Coronary artery disease -No chest pain presently -Personally reviewed EKG--sinus rhythm, nonspecific T wave change  Severe protein calorie malnutrition -Continue Ensure  Dementia without behavior disturbance -daughter relates progressive cognitive decline over last 6 months -remains at [facility] for hospital delirium - Seroquel 25 mg QHS ordered for intermittent delirium  Hyperlipidemia -Continue statin  Goals of Care -palliative consult-->updated code status to DNR after conversations with daughter and family  Disposition Plan:   SNF when bed available likely next week  Consultants:  none  Code Status:  DNR  DVT Prophylaxis:  [place] Lovenox   Procedures: As Listed in Progress Note Above  Antibiotics: None  Subjective: Pt sleeping but arousable, No complaints.     Objective: Vitals:   05/26/19 2108 05/27/19 0528 05/27/19 2118 05/28/19 0659  BP: (!) 83/54 100/61 96/83 110/60  Pulse: 82 73 96 79  Resp: (!) 21 17 17 20   Temp: 97.8 F (36.6 C) 98.2 F (36.8 C) 98.1 F (36.7 C) 97.9 F (36.6 C)  TempSrc: Oral  Oral Oral  SpO2: 97% 98% 96% 97%  Weight:      Height:        Intake/Output Summary (Last 24 hours) at 05/28/2019 1103 Last data filed at 05/28/2019 0900 Gross per 24 hour  Intake 240 ml  Output --  Net 240 ml   Weight change:  Exam:   General:  Emaciated appearing female. Pt is arousable, not in acute distress,  cooperative.   HEENT: No icterus, No thrush, No neck mass, [place]/AT  Cardiovascular: normal S1/S2, no rubs, no gallops  Respiratory: diminished BS but no wheeze  Abdomen: Soft/+BS, non tender, non distended, no guarding  Extremities: No edema, no cyanosis  Data Reviewed: I have personally reviewed following labs and imaging studies Basic Metabolic Panel: Recent Labs  Lab 05/24/19 0524  CREATININE 0.70   Liver Function Tests: No results for input(s): AST, ALT, ALKPHOS, BILITOT, PROT, ALBUMIN in the last 168 hours. No results for input(s): LIPASE, AMYLASE in the last 168 hours. No results for input(s): AMMONIA in the last 168 hours. Coagulation Profile: No results for input(s): INR, PROTIME in the last 168 hours. CBC: No results for input(s): WBC, NEUTROABS, HGB, HCT, MCV, PLT in the last 168 hours. Cardiac Enzymes: No results for input(s): CKTOTAL, CKMB, CKMBINDEX, TROPONINI in the last 168 hours. BNP: Invalid input(s): POCBNP CBG: No results for input(s): GLUCAP in the last 168 hours. HbA1C: No results for input(s): HGBA1C in the last 72 hours. Urine analysis:    Component Value Date/Time   COLORURINE YELLOW 05/17/2019 0020   APPEARANCEUR HAZY (A) 05/17/2019 0020   LABSPEC 1.013 05/17/2019 0020   PHURINE 5.0 05/17/2019 0020   GLUCOSEU NEGATIVE 05/17/2019 0020   HGBUR LARGE (A) 05/17/2019 0020   BILIRUBINUR NEGATIVE 05/17/2019 0020   KETONESUR NEGATIVE 05/17/2019 0020   PROTEINUR 30 (A) 05/17/2019 0020   NITRITE NEGATIVE 05/17/2019 0020   LEUKOCYTESUR TRACE (A) 05/17/2019 0020   Sepsis Labs: @LABRCNTIP (procalcitonin:4,lacticidven:4) ) Recent Results (from the past 240 hour(s))  Novel Coronavirus, NAA (hospital order; send-out to ref lab)     Status: None   Collection Time: 05/22/19  4:11 PM   Specimen: Nasopharyngeal Swab; Respiratory  Result Value  Ref Range Status   SARS-CoV-2, NAA NOT DETECTED NOT DETECTED Final    Comment: (NOTE) This test was developed and  its performance characteristics determined by [employer]. This test has not been FDA cleared or approved. This test has been authorized by FDA under an Emergency Use Authorization (EUA). This test is only authorized for the duration of time the declaration that circumstances exist justifying the authorization of the emergency use of in vitro diagnostic tests for detection of SARS-CoV-2 virus and/or diagnosis of COVID-19 infection under section 564(b)(1) of the Act, 21 U.S.C. 539JQB-3(A)(1), unless the authorization is terminated or revoked sooner. When diagnostic testing is negative, the possibility of a false negative result should be considered in the context of a patient's recent exposures and the presence of clinical signs and symptoms consistent with COVID-19. An individual without symptoms of COVID-19 and who is not shedding SARS-CoV-2 virus would expect to have a negative (not detected) result in this assay. Performed  At: [facility] [place], [place] [id number] Rush Farmer MD BD:[id number]    [place]  Final    Comment: Performed at [facility], [address], [place], [place] [address]    Scheduled Meds:  carvedilol  3.125 mg Oral BID WC   enoxaparin (LOVENOX) injection  30 mg Subcutaneous Q24H   feeding supplement (ENSURE ENLIVE)  237 mL Oral BID BM   LORazepam  0.5 mg Oral Once   multivitamin with minerals  1 tablet Oral Daily   QUEtiapine  25 mg Oral QHS   Continuous Infusions:  Procedures/Studies: Dg Chest 2 View  Result Date: 05/06/2019 CLINICAL DATA:  Altered mental status.  Involuntary commitment. EXAM: CHEST - 2 VIEW COMPARISON:  Chest radiograph 06/21/2017 FINDINGS: Stable cardiac and mediastinal contours. Pulmonary hyperinflation. Emphysematous changes. No large area of pulmonary consolidation. No pleural effusion or pneumothorax. IMPRESSION: No acute cardiopulmonary process. Electronically  Signed   By: Lovey Newcomer M.D.   On: 05/06/2019 19:46    Jermeka Schlotterbeck Wynetta Emery, MD  [facility] How to contact the [facility] Attending or Consulting provider [place] or covering provider during after hours [place], for this patient?  1. Check the care team in [facility] and look for a) attending/consulting TRH provider listed and b) the [facility] team listed 2. Log into [email or web address] and use 's universal password to access. If you do not have the password, please contact the hospital operator. 3. Locate the [facility] provider you are looking for under [facility] and page to a number that you can be directly reached. 4. If you still have difficulty reaching the provider, please page the [facility] (Director on Call) for the Hospitalists listed on amion for assistance.  05/28/2019, 11:03 AM   LOS: 10 days

## 2019-05-29 MED ORDER — ADULT MULTIVITAMIN W/MINERALS CH: 1.0000 | ORAL_TABLET | Freq: Every day | ORAL | Status: AC

## 2019-05-29 MED ORDER — QUETIAPINE FUMARATE 25 MG PO TABS: 25.0000 mg | ORAL_TABLET | Freq: Two times a day (BID) | ORAL | Status: DC

## 2019-05-29 MED ORDER — HALOPERIDOL LACTATE 5 MG/ML IJ SOLN
2.0000 mg | Freq: Once | INTRAMUSCULAR | Status: AC
  Administered 2019-05-29: 2 mg via INTRAMUSCULAR
  Filled 2019-05-29: qty 1

## 2019-05-29 MED ORDER — ENSURE ENLIVE PO LIQD: 237.0000 mL | Freq: Two times a day (BID) | ORAL | 12 refills | Status: AC

## 2019-05-29 MED ORDER — FUROSEMIDE 20 MG PO TABS: 20.0000 mg | ORAL_TABLET | ORAL | 1 refills | Status: DC

## 2019-05-29 MED ORDER — QUETIAPINE FUMARATE 25 MG PO TABS
25.0000 mg | ORAL_TABLET | Freq: Two times a day (BID) | ORAL | Status: DC
  Filled 2019-05-29: qty 1

## 2019-05-29 MED ORDER — POTASSIUM CHLORIDE CRYS ER 10 MEQ PO TBCR: 10.0000 meq | EXTENDED_RELEASE_TABLET | ORAL | 0 refills | Status: AC

## 2019-05-29 MED ORDER — CARVEDILOL 3.125 MG PO TABS: ORAL_TABLET | ORAL | 1 refills | Status: DC

## 2019-05-29 MED ORDER — LORAZEPAM 2 MG/ML IJ SOLN: 0.5000 mg | INTRAMUSCULAR | Status: DC | PRN

## 2019-05-29 NOTE — Progress Notes (Signed)
Patient ambulated down the hall with assistance of [place], [place]. Patient adamantly refused vitals, explained to patient purpose of VS. Refused and said "that's enough." PRN Ativan given, patient encouraged to remain in bed and call for assistance.

## 2019-05-29 NOTE — Progress Notes (Signed)
Patient refusing vitals.  Explained the need for vitals, patient cursed and staid that we were not going make her do anything she didn't want to do.  Will pass to the day nursing staff to assess later this morning.

## 2019-05-29 NOTE — Care Management Important Message (Signed)
Important Message  Patient Details  Name: Ruth Davidson MRN: [id number] Date of Birth: [birth year]   Medicare Important Message Given:  Yes     Luane Rochon, Chauncey [place], RN 05/29/2019, 10:16 AM

## 2019-05-29 NOTE — TOC Progression Note (Addendum)
Transition of Care [facility]) - Progression Note    Patient Details  Name: Ruth Davidson MRN: [id number] Date of Birth: 1941-01-05  [facility] CM/SW Contact  Collins Kerby, Chauncey [place], RN Phone Number: 05/29/2019, 10:12 AM  Clinical Narrative:   Per Lynnae Sandhoff at [facility], patient's attorney has money and will cut a check today. Larene Beach plans to drive to his office and pick up check this afternoon. Patient will DC to [facility] Updated daughter, Boykin Nearing. All questions answered. She plans to take clothes by tomorrow.  Patient has had two negative covid results this admission, [facility] aware.    Expected Discharge Plan: Skilled Nursing Facility Barriers to Discharge: Barriers Resolved  Expected Discharge Plan and Services Expected Discharge Plan: [place]         Expected Discharge Date: 05/22/19                         HH Arranged: RN, PT, Nurse's Aide, Social Work [employer] Agency: [place] ([place]) Date [place]: 05/23/19 Time [place]: [place] Representative spoke with at [place]: [place] ([place]) Interventions    Readmission Risk Interventions Readmission Risk Prevention Plan 05/22/2019  Transportation Screening Complete  Home Care Screening Complete  Medication Review (RN CM) Complete  Some recent data might be hidden

## 2019-05-29 NOTE — Progress Notes (Signed)
PROGRESS NOTE  Ruth Davidson ZOX:[id number] DOB: 10-15-1941 DOA: 05/16/2019 PCP: Avon GullyFanta, Tesfaye, MD  Brief History:  78 year old female with history of coronary artery disease status post remote PCI, systolic CHF EF 20% in 2018, tobacco abuse, COPD, dementia, hypothyroidism, and hyperlipidemia presenting with poor oral intake and increasing confusion.Unfortunately, the patient is unable to provide any significant history. The patient does not know why she is in the hospital. The patient herself denies any fevers, chills, headache, chest pain, shortness breath, nausea, vomiting, diarrhea, abdominal pain. According to patient's daughter, the patient has had very poor oral intake for the last 2 weeks and continues to smoke. In addition, the patient has refused to take any medications for several months. There are no reports of fevers, chills, chest pain, shortness breath, vomiting, diarrhea, abdominal pain. Upon presentation, the patient was afebrile hemodynamically stable saturating 95% room air. Potassium was noted to be 2.4 with serum creatinine 0.93. Lactic acid was 1.1. UA was negative for pyuria. The patient was admitted for failure to thrive and dehydration.The patient was placed on IV fluids and her potassium was repleted. Her renal function improved and electrolytes were optimized. Physical therapy was consulted and recommended 24-hour supervision and possible skilled nursing facility. Unfortunately, the patient continued to have poor oral intake. The patient's daughter was updated throughout the hospitalization. Unfortunately, the patient's insurance company denied placement into a skilled nursing facility. I subsequently had a peer-to-peer discussion with the patient's insurance company representative. Unfortunately, authorization could not be obtained despite the peer to peer review. Subsequently discussed with the patient's daughter (on 05/19/19)after the peer to peer  review and updated her on the situation. I told the patient's daughter that the patient is medically stable. I offered home health physical therapy, RN, and social work with the assistance of our case Production designer, theatre/television/filmmanager. Daughter appealed insurance denial, and she refused to take patient home. Therefore, the patient remained in the hospital until the situation was worked out. APS referral was sent.  On 05/22/19, the daughter decided it was in patient's best interest for her to take patient home.  Therefore, HH PT, RN, SW and Aide were set up. However, the daughter changes her mind frequently and refused to take patient home again on 05/23/19.  Work-up during the hospitalization did not reveal any reversible causes of the patient's intermittent agitation. Therefore, this was likely attributed to the patient's continued cognitive decline from her dementia. It was noted from blood work that the patient had hyperthyroidism. Thyroid-stimulating antibodies were ordered and are negative. Patient will need RAIU study performed as outpatient to clarify etiology of hyperthyroidism. Palliative medicine was consulted. After discussion with the patient's family, the patient was transitioned to DNR status.  Assessment/Plan: Failure to thrive -Check serum B12--265 -Folic acid--16.1 -TSH--0.057 -Free T4--1.55 -PT evaluation-->SNF -UA--no significant pyuria -intermittentlyrefuse to eat or take medications; has poor po intake -awaiting SNF placement--denied by insurance company despite peer to peer review/discussion on 05/19/19 -daughter refuses to take patient home; appealing insurance denial. -05/22/19--daughter changed mind and agrees to take patient home -05/23/19--daughter refuses to take patient home   Hyperthyroidism -check thyroid receptor antibodies (thyroid stimulating immunoglobulin)-->normal -obtain RAIU as outpatient with endocrinologist -TSH--0.057 -Free T4--1.55  Hypokalemia -Repleted -Magnesium  1.9  Chronic systolic and diastolic CHF -06/22/2017 echo EF 15-20%, diffuse HK, grade 1 DD, dilated IVC, trivial TR -Appears clinically dry--improved after fluids -Daily weights--stable -plan to restart lasix to 20 mg daily after discharge at lower dose  as pt has very poor po intake -continue coreg  COPD -Stable on room air  Tobacco abuse -Tobacco cessation discussed -Pt not allowed to smoke in hospital.  Offered nicotine patch.   Coronary artery disease -No chest pain presently -Personally reviewed EKG--sinus rhythm, nonspecific T wave change  Severe protein calorie malnutrition -Ensure was given for supplemental nutrition  Dementia without behavior disturbance -daughter relates progressive cognitive decline over last 6 months -remains at [facility] for hospital delirium - Seroquel 25 mg BID ordered for intermittent delirium  Hyperlipidemia -Continue statin  Goals of Care -palliative consult-->updated code status to DNR after conversations with daughter and family  Disposition Plan:   SNF when bed available  Consultants:  none  Code Status:  DNR  DVT Prophylaxis:  Gann Lovenox   Procedures: As Listed in Progress Note Above  Antibiotics: None  Subjective: Pt denies complaints.       Objective: Vitals:   05/27/19 0528 05/27/19 2118 05/28/19 0659 05/28/19 1447  BP: 100/61 96/83 110/60 (!) 87/66  Pulse: 73 96 79 93  Resp: 17 17 20 18   Temp: 98.2 F (36.8 C) 98.1 F (36.7 C) 97.9 F (36.6 C) 98.1 F (36.7 C)  TempSrc:  Oral Oral Oral  SpO2: 98% 96% 97% 96%  Weight:      Height:        Intake/Output Summary (Last 24 hours) at 05/29/2019 1635 Last data filed at 05/29/2019 1300 Gross per 24 hour  Intake 180 ml  Output --  Net 180 ml   Weight change:  Exam:   General:  Emaciated appearing female. Pt is arousable, not in acute distress, cooperative.   HEENT: No icterus, No thrush, No neck mass, Delta/AT  Cardiovascular: normal S1/S2, no rubs,  no gallops  Respiratory: diminished BS but no wheeze  Abdomen: Soft/+BS, non tender, non distended, no guarding  Extremities: No edema, no cyanosis  Data Reviewed: I have personally reviewed following labs and imaging studies Basic Metabolic Panel: Recent Labs  Lab 05/24/19 0524  CREATININE 0.70   Liver Function Tests: No results for input(s): AST, ALT, ALKPHOS, BILITOT, PROT, ALBUMIN in the last 168 hours. No results for input(s): LIPASE, AMYLASE in the last 168 hours. No results for input(s): AMMONIA in the last 168 hours. Coagulation Profile: No results for input(s): INR, PROTIME in the last 168 hours. CBC: No results for input(s): WBC, NEUTROABS, HGB, HCT, MCV, PLT in the last 168 hours. Cardiac Enzymes: No results for input(s): CKTOTAL, CKMB, CKMBINDEX, TROPONINI in the last 168 hours. BNP: Invalid input(s): POCBNP CBG: No results for input(s): GLUCAP in the last 168 hours. HbA1C: No results for input(s): HGBA1C in the last 72 hours. Urine analysis:    Component Value Date/Time   COLORURINE YELLOW 05/17/2019 0020   APPEARANCEUR HAZY (A) 05/17/2019 0020   LABSPEC 1.013 05/17/2019 0020   PHURINE 5.0 05/17/2019 0020   GLUCOSEU NEGATIVE 05/17/2019 0020   HGBUR LARGE (A) 05/17/2019 0020   BILIRUBINUR NEGATIVE 05/17/2019 0020   KETONESUR NEGATIVE 05/17/2019 0020   PROTEINUR 30 (A) 05/17/2019 0020   NITRITE NEGATIVE 05/17/2019 0020   LEUKOCYTESUR TRACE (A) 05/17/2019 0020   Sepsis Labs: @LABRCNTIP (procalcitonin:4,lacticidven:4) ) Recent Results (from the past 240 hour(s))  Novel Coronavirus, NAA (hospital order; send-out to ref lab)     Status: None   Collection Time: 05/22/19  4:11 PM   Specimen: Nasopharyngeal Swab; Respiratory  Result Value Ref Range Status   SARS-CoV-2, NAA NOT DETECTED NOT DETECTED Final    Comment: (  NOTE) This test was developed and its performance characteristics determined by [facility]. This test has not been FDA cleared or  approved. This test has been authorized by FDA under an Emergency Use Authorization (EUA). This test is only authorized for the duration of time the declaration that circumstances exist justifying the authorization of the emergency use of in vitro diagnostic tests for detection of SARS-CoV-2 virus and/or diagnosis of COVID-19 infection under section 564(b)(1) of the Act, 21 U.S.C. 161WRU-0(A)(5360bbb-3(b)(1), unless the authorization is terminated or revoked sooner. When diagnostic testing is negative, the possibility of a false negative result should be considered in the context of a patient's recent exposures and the presence of clinical signs and symptoms consistent with COVID-19. An individual without symptoms of COVID-19 and who is not shedding SARS-CoV-2 virus would expect to have a negative (not detected) result in this assay. Performed  At: [facility] [address] [place], [place] [id number] Jolene SchimkeNagendra Sanjai MD NW:[id number]:[phone]    Coronavirus Source NASOPHARYNGEAL  Final    Comment: Performed at [facility], [address] [place], [place] [address]  Novel Coronavirus, NAA (hospital order; send-out to ref lab)     Status: None   Collection Time: 05/27/19  2:44 PM   Specimen: Nasopharyngeal Swab; Respiratory  Result Value Ref Range Status   SARS-CoV-2, NAA NOT DETECTED NOT DETECTED Final    Comment: (NOTE) This test was developed and its performance characteristics determined by [facility]. This test has not been FDA cleared or approved. This test has been authorized by FDA under an Emergency Use Authorization (EUA). This test is only authorized for the duration of time the declaration that circumstances exist justifying the authorization of the emergency use of in vitro diagnostic tests for detection of SARS-CoV-2 virus and/or diagnosis of COVID-19 infection under section 564(b)(1) of the Act, 21 U.S.C. 962XBM-8(U)(1360bbb-3(b)(1), unless the authorization is terminated or  revoked sooner. When diagnostic testing is negative, the possibility of a false negative result should be considered in the context of a patient's recent exposures and the presence of clinical signs and symptoms consistent with COVID-19. An individual without symptoms of COVID-19 and who is not shedding SARS-CoV-2 virus would expect to have a negative (not detected) result in this assay. Performed  At: [facility] [address] [place], [place] [id number] Jolene SchimkeNagendra Sanjai MD OZ:[id number]:[phone]    Coronavirus Source NASOPHARYNGEAL  Final    Comment: Performed at [facility], [address] [place], [place] [address]    Scheduled Meds:  carvedilol  3.125 mg Oral BID WC   enoxaparin (LOVENOX) injection  30 mg Subcutaneous Q24H   feeding supplement (ENSURE ENLIVE)  237 mL Oral BID BM   LORazepam  0.5 mg Oral Once   multivitamin with minerals  1 tablet Oral Daily   QUEtiapine  25 mg Oral BID   Continuous Infusions:  Procedures/Studies: Dg Chest 2 View  Result Date: 05/06/2019 CLINICAL DATA:  Altered mental status.  Involuntary commitment. EXAM: CHEST - 2 VIEW COMPARISON:  Chest radiograph 06/21/2017 FINDINGS: Stable cardiac and mediastinal contours. Pulmonary hyperinflation. Emphysematous changes. No large area of pulmonary consolidation. No pleural effusion or pneumothorax. IMPRESSION: No acute cardiopulmonary process. Electronically Signed   By: Annia Beltrew  Davis M.D.   On: 05/06/2019 19:46    Kin Galbraith Laural BenesJohnson, MD  [facility] How to contact the [facility] Attending or Consulting provider 7A - 7P or covering provider during after hours 7P -7A, for this patient?  1. Check the care team in [facility] and  look for a) attending/consulting TR[facility] provider listed and b) the [facility] team listed 2. Log into [email or web address] and use [facility]'s universal password to access. If you do not have the password, please contact the hospital operator. 3. Locate the [facility] provider you are looking for under [facility] and page to a number that you can be directly reached. 4. If you still have difficulty reaching the provider, please page the [facility] (Director on Call) for the Hospitalists listed on amion for assistance.  05/29/2019, 4:35 PM   LOS: 11 days

## 2019-05-29 NOTE — Discharge Summary (Signed)
Physician Discharge Summary  Ruth Davidson MVH:[id number] DOB: Feb 21, 1941 DOA: 05/16/2019  PCP: Avon GullyFanta, Tesfaye, MD  Admit date: 05/16/2019 Discharge date: 05/30/2019  Admitted From: Home  Disposition:  [facility]   Recommendations for Outpatient Follow-up:  1. Follow up with PCP in 2-4 weeks 2. Establish care with endocrinologist in 2 weeks regarding: Check thyroid  Discharge Condition: STABLE   CODE STATUS: DNR    Brief Hospitalization Summary: Please see all hospital notes, images, labs for full details of the hospitalization. Dr. Donato Heinzrtiz's HPI: Ruth Davidson is a 78 y.o. female with medical history significant of CAD, history of MI, cardiac cachexia, chronic diastolic CHF, COPD, tobacco use, dementia, medical treatment noncompliance, hypothyroidism who is coming to the emergency department brought by her daughter due to mild worsening in dementia and lack of appetite for the past 2 weeks.  The patient is unable to provide further history.  ED Course: Initial vital signs temperature 98.5 F, pulse 107, respirations 17, blood pressure 129/55 mmHg and O2 sat 99% on room air.  The patient received potassium replacement and magnesium supplementation in the emergency department.  I ordered Ativan 0.5 mg IVP after the patient tried to get out of bed multiple times.  She seems to be confused and asked the same questions multiple times.  Her urinalysis showed large hemoglobinuria, trace leukocyte esterase and rare bacteria.  There was proteinuria 30 mg/dL.  CBC showed a mildly increased hematocrit, but was otherwise normal.  CMP shows a potassium of 2.4 mmol/L, glucose of 118 mg/dL and albumin of 3.4 g/dL.  All other values including renal and hepatic functions are within normal limits.  Lactic acid was normal.  Magnesium and phosphorus are within expected values.  Brief History:  78 year old female with history of coronary artery disease status post remote PCI, systolic CHF EF 20% in 2018,  tobacco abuse, COPD, dementia, hypothyroidism, and hyperlipidemia presenting with poor oral intake and increasing confusion.Unfortunately, the patient is unable to provide any significant history. The patient does not know why she is in the hospital. The patient herself denies any fevers, chills, headache, chest pain, shortness breath, nausea, vomiting, diarrhea, abdominal pain. According to patient's daughter, the patient has had very poor oral intake for the last 2 weeks and continues to smoke. In addition, the patient has refused to take any medications for several months. There are no reports of fevers, chills, chest pain, shortness breath, vomiting, diarrhea, abdominal pain. Upon presentation, the patient was afebrile hemodynamically stable saturating 95% room air. Potassium was noted to be 2.4 with serum creatinine 0.93. Lactic acid was 1.1. UA was negative for pyuria. The patient was admitted for failure to thrive and dehydration.The patient was placed on IV fluids and her potassium was repleted. Her renal function improved and electrolytes were optimized. Physical therapy was consulted and recommended 24-hour supervision and possible skilled nursing facility. Unfortunately, the patient continued to have poor oral intake. The patient's daughter was updated throughout the hospitalization. Unfortunately, the patient's insurance company denied placement into a skilled nursing facility. I subsequently had a peer-to-peer discussion with the patient's insurance company representative. Unfortunately, authorization could not be obtained despite the peer to peer review. Subsequently discussed with the patient's daughter (on 05/19/19)after the peer to peer review and updated her on the situation. I told the patient's daughter that the patient is medically stable. I offered home health physical therapy, RN, and social work with the assistance of our case Production designer, theatre/television/filmmanager. Daughter appealed insurance  denial, and she refused  to take patient home. Therefore, the patient remained in the hospital until the situation was worked out. APS referral was sent.  On 05/22/19, the daughter decided it was in patient's best interest for her to take patient home. Therefore, HH PT, RN, SW and Aide were set up. However, the daughter changes her mind frequently and refused to take patient home again on 05/23/19.  Work-up during the hospitalization did not reveal any reversible causes of the patient's intermittent agitation. Therefore, this was likely attributed to the patient's continued cognitive decline from her dementia. It was noted from blood work that the patient had hyperthyroidism. Thyroid-stimulating antibodies were ordered and arenegative.Patient will need RAIU study performed as outpatient to clarify etiology of hyperthyroidism.Palliative medicine was consulted. After discussion with the patient's family, the patient was transitioned to DNR status.  Assessment/Plan: Failure to thrive -Check serum B12--265 -Folic acid--16.1 -TSH--0.057 -Free T4--1.55 -PT evaluation-->SNF -UA--no significant pyuria -intermittentlyrefuse to eator take medications; has poor po intake -awaiting SNF placement--denied by insurance company despite peer to peer review/discussion on 05/19/19 -daughter refuses to take patient home; appealing insurance denial. -05/22/19--daughter changed mind and agrees to take patient home -05/23/19--daughter refuses to take patient home   Hyperthyroidism -check thyroid receptor antibodies (thyroid stimulating immunoglobulin)-->normal -obtain RAIU as outpatient with endocrinologist. Please establish care with Dr. Fransico HimNida in 2 weeks to check thyroid.  -TSH--0.057 -Free T4--1.55  Hypokalemia -Repleted -Magnesium 1.9  Chronic systolic and diastolic CHF -06/22/2017 echo EF 15-20%, diffuse HK, grade 1 DD, dilated IVC, trivial TR -Appears clinically dry--improved after  fluids -Daily weights--stable -plan to restart lasixto 20 mg dailyafter dischargeat lower dose as pt has very poor po intake -continue coreg 3.125 mg BID  COPD -Stable on room air  Tobacco abuse -Tobacco cessation discussed -Pt not allowed to smoke in hospital.  Offered nicotine patch.   Coronary artery disease -No chest pain presently -Personally reviewed EKG--sinus rhythm, nonspecific T wave change  Severe protein calorie malnutrition -Ensure was given for supplemental nutrition  Dementia without behavior disturbance -daughter relates progressive cognitive decline over last 6 months -remains at [facility] for hospital delirium - Seroquel 25 mg BID ordered for intermittent delirium  Hyperlipidemia -Continue statin  Goals of Care -palliative consult-->updated code status to DNR after conversations with daughter and family  Disposition Plan:   SNF when bed available [facility]   Consultants:  none  Code Status:  DNR  DVT Prophylaxis:  [place] Lovenox   Procedures: As Listed in Progress Note Above  Discharge Diagnoses:  Principal Problem:   Hypokalemia Active Problems:   CAD S/P remote PCI   Hyperlipidemia   Tobacco abuse   Emphysema of lung (HCC)   Dementia (HCC)   Chronic combined systolic and diastolic CHF (congestive heart failure) (HCC)   Palliative care by specialist   Failure to thrive in adult   Protein-calorie malnutrition, severe   Severe protein-calorie malnutrition (HCC)   Hyperthyroidism  Discharge Instructions:  Allergies as of 05/29/2019   No Known Allergies     Medication List    TAKE these medications   aspirin 81 MG chewable tablet Chew 1 tablet (81 mg total) by mouth daily. Restart on 06/28/17   carvedilol 3.125 MG tablet Commonly known as: COREG TAKE 1 TABLET BY MOUTH TWICE DAILY WITH A MEAL   feeding supplement (ENSURE ENLIVE) Liqd Take 237 mLs by mouth 2 (two) times daily between meals.   furosemide 20 MG  tablet Commonly known as: LASIX Take 1 tablet (20 mg total) by  mouth every other day. Start taking on: May 31, 2019 What changed:   medication strength  how much to take  when to take this   multivitamin with minerals Tabs tablet Take 1 tablet by mouth daily. Start taking on: May 30, 2019   nitroGLYCERIN 0.4 MG SL tablet Commonly known as: NITROSTAT Place 1 tablet (0.4 mg total) under the tongue every 5 (five) minutes as needed for chest pain (or back pain or arm pain).   potassium chloride 10 MEQ tablet Commonly known as: K-DUR Take 1 tablet (10 mEq total) by mouth every other day. Start taking on: May 31, 2019 What changed:   medication strength  how much to take  when to take this   QUEtiapine 25 MG tablet Commonly known as: SEROQUEL Take 1 tablet (25 mg total) by mouth 2 (two) times daily.   simvastatin 80 MG tablet Commonly known as: ZOCOR Take 1 tablet (80 mg total) by mouth daily with breakfast.      Follow-up Information    Nida, Denman George, MD. Schedule an appointment as soon as possible for a visit in 2 week(s).   Specialty: Endocrinology Why: Establish care for hyperthyroidism  Contact information: [address] [place] [address] [phone]        Avon Gully, MD. Schedule an appointment as soon as possible for a visit in 4 week(s).   Specialty: Internal Medicine Contact information: [address] [place] [address] [phone]          No Known Allergies Allergies as of 05/29/2019   No Known Allergies     Medication List    TAKE these medications   aspirin 81 MG chewable tablet Chew 1 tablet (81 mg total) by mouth daily. Restart on 06/28/17   carvedilol 3.125 MG tablet Commonly known as: COREG TAKE 1 TABLET BY MOUTH TWICE DAILY WITH A MEAL   feeding supplement (ENSURE ENLIVE) Liqd Take 237 mLs by mouth 2 (two) times daily between meals.   furosemide 20 MG tablet Commonly known as:  LASIX Take 1 tablet (20 mg total) by mouth every other day. Start taking on: May 31, 2019 What changed:   medication strength  how much to take  when to take this   multivitamin with minerals Tabs tablet Take 1 tablet by mouth daily. Start taking on: May 30, 2019   nitroGLYCERIN 0.4 MG SL tablet Commonly known as: NITROSTAT Place 1 tablet (0.4 mg total) under the tongue every 5 (five) minutes as needed for chest pain (or back pain or arm pain).   potassium chloride 10 MEQ tablet Commonly known as: K-DUR Take 1 tablet (10 mEq total) by mouth every other day. Start taking on: May 31, 2019 What changed:   medication strength  how much to take  when to take this   QUEtiapine 25 MG tablet Commonly known as: SEROQUEL Take 1 tablet (25 mg total) by mouth 2 (two) times daily.   simvastatin 80 MG tablet Commonly known as: ZOCOR Take 1 tablet (80 mg total) by mouth daily with breakfast.      Procedures/Studies: Dg Chest 2 View  Result Date: 05/06/2019 CLINICAL DATA:  Altered mental status.  Involuntary commitment. EXAM: CHEST - 2 VIEW COMPARISON:  Chest radiograph 06/21/2017 FINDINGS: Stable cardiac and mediastinal contours. Pulmonary hyperinflation. Emphysematous changes. No large area of pulmonary consolidation. No pleural effusion or pneumothorax. IMPRESSION: No acute cardiopulmonary process. Electronically Signed   By: Annia Belt M.D.   On: 05/06/2019 19:46  Subjective: Pt remains intermittently confused, but no specific complaints.   Discharge Exam: Vitals:   05/28/19 0659 05/28/19 1447  BP: 110/60 (!) 87/66  Pulse: 79 93  Resp: 20 18  Temp: 97.9 F (36.6 C) 98.1 F (36.7 C)  SpO2: 97% 96%   Vitals:   05/27/19 0528 05/27/19 2118 05/28/19 0659 05/28/19 1447  BP: 100/61 96/83 110/60 (!) 87/66  Pulse: 73 96 79 93  Resp: 17 17 20 18   Temp: 98.2 F (36.8 C) 98.1 F (36.7 C) 97.9 F (36.6 C) 98.1 F (36.7 C)  TempSrc:  Oral Oral Oral  SpO2: 98% 96%  97% 96%  Weight:      Height:        General:  Emaciated appearing female. Pt is arousable, not in acute distress, cooperative.   HEENT: No icterus, No thrush, No neck mass, [place]/AT  Cardiovascular: normal S1/S2, no rubs, no gallops  Respiratory: diminished BS but no wheeze  Abdomen: Soft/+BS, non tender, non distended, no guarding  Extremities: No edema, no cyanosis   The results of significant diagnostics from this hospitalization (including imaging, microbiology, ancillary and laboratory) are listed below for reference.     Microbiology: Recent Results (from the past 240 hour(s))  Novel Coronavirus, NAA (hospital order; send-out to ref lab)     Status: None   Collection Time: 05/22/19  4:11 PM   Specimen: Nasopharyngeal Swab; Respiratory  Result Value Ref Range Status   SARS-CoV-2, NAA NOT DETECTED NOT DETECTED Final    Comment: (NOTE) This test was developed and its performance characteristics determined by [employer]. This test has not been FDA cleared or approved. This test has been authorized by FDA under an Emergency Use Authorization (EUA). This test is only authorized for the duration of time the declaration that circumstances exist justifying the authorization of the emergency use of in vitro diagnostic tests for detection of SARS-CoV-2 virus and/or diagnosis of COVID-19 infection under section 564(b)(1) of the Act, 21 U.S.C. 161WRU-0(A)(5), unless the authorization is terminated or revoked sooner. When diagnostic testing is negative, the possibility of a false negative result should be considered in the context of a patient's recent exposures and the presence of clinical signs and symptoms consistent with COVID-19. An individual without symptoms of COVID-19 and who is not shedding SARS-CoV-2 virus would expect to have a negative (not detected) result in this assay. Performed  At: [facility] [address] [place], [place] [id number] Rush Farmer MD NW:[id number]    [place]  Final    Comment: Performed at [facility], [address], [place], [place] [address]  Novel Coronavirus, NAA (hospital order; send-out to ref lab)     Status: None   Collection Time: 05/27/19  2:44 PM   Specimen: Nasopharyngeal Swab; Respiratory  Result Value Ref Range Status   SARS-CoV-2, NAA NOT DETECTED NOT DETECTED Final    Comment: (NOTE) This test was developed and its performance characteristics determined by [employer]. This test has not been FDA cleared or approved. This test has been authorized by FDA under an Emergency Use Authorization (EUA). This test is only authorized for the duration of time the declaration that circumstances exist justifying the authorization of the emergency use of in vitro diagnostic tests for detection of SARS-CoV-2 virus and/or diagnosis of COVID-19 infection under section 564(b)(1) of the Act, 21 U.S.C. 962XBM-8(U)(1), unless the authorization is terminated or revoked sooner. When diagnostic testing is negative, the possibility of a false negative result should  be considered in the context of a patient's recent exposures and the presence of clinical signs and symptoms consistent with COVID-19. An individual without symptoms of COVID-19 and who is not shedding SARS-CoV-2 virus would expect to have a negative (not detected) result in this assay. Performed  At: [facility] [address] [place], [place] [id number] Jolene Schimke MD ZD:[id number]    Coronavirus Source NASOPHARYNGEAL  Final    Comment: Performed at [facility], [address] [place], [place] [address]     Labs: BNP (last 3 results) Recent Labs    05/16/19 2232  BNP 313.0*   Basic Metabolic Panel: Recent Labs  Lab 05/24/19 0524  CREATININE 0.70   Liver Function Tests: No results for input(s): AST, ALT, ALKPHOS, BILITOT, PROT, ALBUMIN in the last 168 hours. No results for input(s):  LIPASE, AMYLASE in the last 168 hours. No results for input(s): AMMONIA in the last 168 hours. CBC: No results for input(s): WBC, NEUTROABS, HGB, HCT, MCV, PLT in the last 168 hours. Cardiac Enzymes: No results for input(s): CKTOTAL, CKMB, CKMBINDEX, TROPONINI in the last 168 hours. BNP: Invalid input(s): POCBNP CBG: No results for input(s): GLUCAP in the last 168 hours. D-Dimer No results for input(s): DDIMER in the last 72 hours. Hgb A1c No results for input(s): HGBA1C in the last 72 hours. Lipid Profile No results for input(s): CHOL, HDL, LDLCALC, TRIG, CHOLHDL, LDLDIRECT in the last 72 hours. Thyroid function studies No results for input(s): TSH, T4TOTAL, T3FREE, THYROIDAB in the last 72 hours.  Invalid input(s): FREET3 Anemia work up No results for input(s): VITAMINB12, FOLATE, FERRITIN, TIBC, IRON, RETICCTPCT in the last 72 hours. Urinalysis    Component Value Date/Time   COLORURINE YELLOW 05/17/2019 0020   APPEARANCEUR HAZY (A) 05/17/2019 0020   LABSPEC 1.013 05/17/2019 0020   PHURINE 5.0 05/17/2019 0020   GLUCOSEU NEGATIVE 05/17/2019 0020   HGBUR LARGE (A) 05/17/2019 0020   BILIRUBINUR NEGATIVE 05/17/2019 0020   KETONESUR NEGATIVE 05/17/2019 0020   PROTEINUR 30 (A) 05/17/2019 0020   NITRITE NEGATIVE 05/17/2019 0020   LEUKOCYTESUR TRACE (A) 05/17/2019 0020   Sepsis Labs Invalid input(s): PROCALCITONIN,  WBC,  LACTICIDVEN Microbiology Recent Results (from the past 240 hour(s))  Novel Coronavirus, NAA (hospital order; send-out to ref lab)     Status: None   Collection Time: 05/22/19  4:11 PM   Specimen: Nasopharyngeal Swab; Respiratory  Result Value Ref Range Status   SARS-CoV-2, NAA NOT DETECTED NOT DETECTED Final    Comment: (NOTE) This test was developed and its performance characteristics determined by [employer]. This test has not been FDA cleared or approved. This test has been authorized by FDA under an Emergency Use Authorization (EUA). This  test is only authorized for the duration of time the declaration that circumstances exist justifying the authorization of the emergency use of in vitro diagnostic tests for detection of SARS-CoV-2 virus and/or diagnosis of COVID-19 infection under section 564(b)(1) of the Act, 21 U.S.C. 875IEP-3(I)(9), unless the authorization is terminated or revoked sooner. When diagnostic testing is negative, the possibility of a false negative result should be considered in the context of a patient's recent exposures and the presence of clinical signs and symptoms consistent with COVID-19. An individual without symptoms of COVID-19 and who is not shedding SARS-CoV-2 virus would expect to have a negative (not detected) result in this assay. Performed  At: [facility] [address] [place], [place] [id number] Jolene Schimke MD YT:[id number]    Coronavirus Source NASOPHARYNGEAL  Final    Comment: Performed at [facility], [address] [place], [place] [address]  Novel Coronavirus, NAA (hospital order; send-out to ref lab)     Status: None   Collection Time: 05/27/19  2:44 PM   Specimen: Nasopharyngeal Swab; Respiratory  Result Value Ref Range Status   SARS-CoV-2, NAA NOT DETECTED NOT DETECTED Final    Comment: (NOTE) This test was developed and its performance characteristics determined by [employer] [facility]. This test has not been FDA cleared or approved. This test has been authorized by FDA under an Emergency Use Authorization (EUA). This test is only authorized for the duration of time the declaration that circumstances exist justifying the authorization of the emergency use of in vitro diagnostic tests for detection of SARS-CoV-2 virus and/or diagnosis of COVID-19 infection under section 564(b)(1) of the Act, 21 U.S.C. 604VWU-9(W)(1360bbb-3(b)(1), unless the authorization is terminated or revoked sooner. When diagnostic testing is negative, the possibility of a false negative result should  be considered in the context of a patient's recent exposures and the presence of clinical signs and symptoms consistent with COVID-19. An individual without symptoms of COVID-19 and who is not shedding SARS-CoV-2 virus would expect to have a negative (not detected) result in this assay. Performed  At: [facility] [address] [place], [place] [id number] Jolene SchimkeNagendra Sanjai MD AO:[id number]:[phone]    Coronavirus Source NASOPHARYNGEAL  Final    Comment: Performed at [facility], [address] [place], [place] [address]   Time coordinating discharge: 40 mins  SIGNED:  Standley Dakinslanford Choua Chalker, MD  [facility] 05/29/2019, 4:44 PM How to contact the [facility] Attending or Consulting provider 7A - 7P or covering provider during after hours 7P -7A, for this patient?  1. Check the care team in [facility] and look for a) attending/consulting TR[facility] provider listed and b) the [facility] team listed 2. Log into [email or web address] and use [place]'s universal password to access. If you do not have the password, please contact the hospital operator. 3. Locate the [facility] provider you are looking for under [facility] and page to a number that you can be directly reached. 4. If you still have difficulty reaching the provider, please page the [facility] (Director on Call) for the Hospitalists listed on amion for assistance.

## 2019-05-29 NOTE — Progress Notes (Signed)
Ambulated patient again approximately 100 ft. Patient very unsteady on her feet, encouraged to go into room and lay in bed or sit in chair, refused to do so. Very anger and cussing at staff, refusing to stay in bed or room. Removed clothes and tried to go into hallway. Staff was able to coax patient back on bed and put shirt back on. Dr. Wynetta Emery made aware, PRN Ativan increased and Haldol one time dose given IM. Patient very complaint and allowed staff to administer medication. Warm blanket given, needs addressed. Will continue to monitor.

## 2019-05-30 NOTE — Progress Notes (Signed)
Report called to [facility]. Patient transported to SNF via EMS.

## 2019-05-30 NOTE — TOC Transition Note (Signed)
Transition of Care [facility]) - CM/SW Discharge Note   Patient Details  Name: Ruth Davidson MRN: [id number] Date of Birth: 07-07-1941  [facility]) CM/SW Contact:  Boneta Lucks, RN Phone Number: 05/30/2019, 9:11 AM   Clinical Narrative:   [facility] is ready for patient. Updated [facility] and called for a preferred time to accpet patient.  Called EMS for transport, scheduled for 11:00 am.  RN given the number to call report. Hurshel Keys - daughter updated.    Final next level of care: Skilled Nursing Facility Barriers to Discharge: Barriers Resolved   Patient Goals and CMS Choice Patient states their goals for this hospitalization and ongoing recovery are:: to go to SNF CMS Medicare.gov Compare Post Acute Care list provided to:: Patient Represenative (must comment) Choice offered to / list presented to : Adult Children  Discharge Placement              Patient [facility] bed at: [facility] Patient to be transferred to facility by: EMS Name of family member notified: [place] Patient and family notified of of transfer: 05/29/19  Discharge Plan and Services   Discharge Planning Services: CM Consult                      HH Arranged: RN, PT, Nurse's Aide, Social Work [employer] Agency: [place] ([place]) Date [place]: 05/23/19 Time [place]: [place] Representative spoke with at [place]: [place] ([place]) Interventions     Readmission Risk Interventions Readmission Risk Prevention Plan 05/22/2019  Transportation Screening Complete  Home Care Screening Complete  Medication Review (RN CM) Complete  Some recent data might be hidden

## 2019-05-31 DIAGNOSIS — J449 Chronic obstructive pulmonary disease, unspecified: Secondary | ICD-10-CM | POA: Diagnosis not present

## 2019-05-31 DIAGNOSIS — F0390 Unspecified dementia without behavioral disturbance: Secondary | ICD-10-CM | POA: Diagnosis not present

## 2019-05-31 DIAGNOSIS — F0391 Unspecified dementia with behavioral disturbance: Secondary | ICD-10-CM | POA: Diagnosis not present

## 2019-05-31 DIAGNOSIS — I5020 Unspecified systolic (congestive) heart failure: Secondary | ICD-10-CM | POA: Diagnosis not present

## 2019-06-01 DIAGNOSIS — E785 Hyperlipidemia, unspecified: Secondary | ICD-10-CM | POA: Diagnosis not present

## 2019-06-01 DIAGNOSIS — E0590 Thyrotoxicosis, unspecified without thyrotoxic crisis or storm: Secondary | ICD-10-CM | POA: Diagnosis not present

## 2019-06-01 DIAGNOSIS — I5042 Chronic combined systolic (congestive) and diastolic (congestive) heart failure: Secondary | ICD-10-CM | POA: Diagnosis not present

## 2019-06-01 DIAGNOSIS — E43 Unspecified severe protein-calorie malnutrition: Secondary | ICD-10-CM | POA: Diagnosis not present

## 2019-06-09 DIAGNOSIS — Z20828 Contact with and (suspected) exposure to other viral communicable diseases: Secondary | ICD-10-CM | POA: Diagnosis not present

## 2019-06-09 DIAGNOSIS — Z1383 Encounter for screening for respiratory disorder NEC: Secondary | ICD-10-CM | POA: Diagnosis not present

## 2019-06-12 DIAGNOSIS — Z1383 Encounter for screening for respiratory disorder NEC: Secondary | ICD-10-CM | POA: Diagnosis not present

## 2019-06-12 DIAGNOSIS — Z20828 Contact with and (suspected) exposure to other viral communicable diseases: Secondary | ICD-10-CM | POA: Diagnosis not present

## 2019-06-26 DIAGNOSIS — Z20828 Contact with and (suspected) exposure to other viral communicable diseases: Secondary | ICD-10-CM | POA: Diagnosis not present

## 2019-06-26 DIAGNOSIS — Z1383 Encounter for screening for respiratory disorder NEC: Secondary | ICD-10-CM | POA: Diagnosis not present

## 2019-06-30 DIAGNOSIS — R2681 Unsteadiness on feet: Secondary | ICD-10-CM | POA: Diagnosis not present

## 2019-06-30 DIAGNOSIS — R262 Difficulty in walking, not elsewhere classified: Secondary | ICD-10-CM | POA: Diagnosis not present

## 2019-06-30 DIAGNOSIS — M6281 Muscle weakness (generalized): Secondary | ICD-10-CM | POA: Diagnosis not present

## 2019-06-30 DIAGNOSIS — U071 COVID-19: Secondary | ICD-10-CM | POA: Diagnosis not present

## 2019-07-03 DIAGNOSIS — M6281 Muscle weakness (generalized): Secondary | ICD-10-CM | POA: Diagnosis not present

## 2019-07-03 DIAGNOSIS — R2681 Unsteadiness on feet: Secondary | ICD-10-CM | POA: Diagnosis not present

## 2019-07-03 DIAGNOSIS — R262 Difficulty in walking, not elsewhere classified: Secondary | ICD-10-CM | POA: Diagnosis not present

## 2019-07-03 DIAGNOSIS — U071 COVID-19: Secondary | ICD-10-CM | POA: Diagnosis not present

## 2019-07-04 DIAGNOSIS — M6281 Muscle weakness (generalized): Secondary | ICD-10-CM | POA: Diagnosis not present

## 2019-07-04 DIAGNOSIS — R262 Difficulty in walking, not elsewhere classified: Secondary | ICD-10-CM | POA: Diagnosis not present

## 2019-07-04 DIAGNOSIS — Z7901 Long term (current) use of anticoagulants: Secondary | ICD-10-CM | POA: Diagnosis not present

## 2019-07-04 DIAGNOSIS — R2681 Unsteadiness on feet: Secondary | ICD-10-CM | POA: Diagnosis not present

## 2019-07-04 DIAGNOSIS — U071 COVID-19: Secondary | ICD-10-CM | POA: Diagnosis not present

## 2019-07-05 DIAGNOSIS — M6281 Muscle weakness (generalized): Secondary | ICD-10-CM | POA: Diagnosis not present

## 2019-07-05 DIAGNOSIS — U071 COVID-19: Secondary | ICD-10-CM | POA: Diagnosis not present

## 2019-07-05 DIAGNOSIS — R262 Difficulty in walking, not elsewhere classified: Secondary | ICD-10-CM | POA: Diagnosis not present

## 2019-07-05 DIAGNOSIS — R2681 Unsteadiness on feet: Secondary | ICD-10-CM | POA: Diagnosis not present

## 2019-07-06 ENCOUNTER — Ambulatory Visit: Payer: Self-pay | Admitting: "Endocrinology

## 2019-07-06 DIAGNOSIS — R262 Difficulty in walking, not elsewhere classified: Secondary | ICD-10-CM | POA: Diagnosis not present

## 2019-07-06 DIAGNOSIS — M6281 Muscle weakness (generalized): Secondary | ICD-10-CM | POA: Diagnosis not present

## 2019-07-06 DIAGNOSIS — U071 COVID-19: Secondary | ICD-10-CM | POA: Diagnosis not present

## 2019-07-06 DIAGNOSIS — R2681 Unsteadiness on feet: Secondary | ICD-10-CM | POA: Diagnosis not present

## 2019-07-07 DIAGNOSIS — M6281 Muscle weakness (generalized): Secondary | ICD-10-CM | POA: Diagnosis not present

## 2019-07-07 DIAGNOSIS — U071 COVID-19: Secondary | ICD-10-CM | POA: Diagnosis not present

## 2019-07-07 DIAGNOSIS — R2681 Unsteadiness on feet: Secondary | ICD-10-CM | POA: Diagnosis not present

## 2019-07-07 DIAGNOSIS — R262 Difficulty in walking, not elsewhere classified: Secondary | ICD-10-CM | POA: Diagnosis not present

## 2019-07-10 DIAGNOSIS — R2681 Unsteadiness on feet: Secondary | ICD-10-CM | POA: Diagnosis not present

## 2019-07-10 DIAGNOSIS — M6281 Muscle weakness (generalized): Secondary | ICD-10-CM | POA: Diagnosis not present

## 2019-07-10 DIAGNOSIS — U071 COVID-19: Secondary | ICD-10-CM | POA: Diagnosis not present

## 2019-07-10 DIAGNOSIS — R262 Difficulty in walking, not elsewhere classified: Secondary | ICD-10-CM | POA: Diagnosis not present

## 2019-07-11 DIAGNOSIS — R2681 Unsteadiness on feet: Secondary | ICD-10-CM | POA: Diagnosis not present

## 2019-07-11 DIAGNOSIS — J449 Chronic obstructive pulmonary disease, unspecified: Secondary | ICD-10-CM | POA: Diagnosis not present

## 2019-07-11 DIAGNOSIS — F0390 Unspecified dementia without behavioral disturbance: Secondary | ICD-10-CM | POA: Diagnosis not present

## 2019-07-11 DIAGNOSIS — M6281 Muscle weakness (generalized): Secondary | ICD-10-CM | POA: Diagnosis not present

## 2019-07-11 DIAGNOSIS — R262 Difficulty in walking, not elsewhere classified: Secondary | ICD-10-CM | POA: Diagnosis not present

## 2019-07-11 DIAGNOSIS — I5020 Unspecified systolic (congestive) heart failure: Secondary | ICD-10-CM | POA: Diagnosis not present

## 2019-07-11 DIAGNOSIS — U071 COVID-19: Secondary | ICD-10-CM | POA: Diagnosis not present

## 2019-07-12 DIAGNOSIS — R2681 Unsteadiness on feet: Secondary | ICD-10-CM | POA: Diagnosis not present

## 2019-07-12 DIAGNOSIS — R262 Difficulty in walking, not elsewhere classified: Secondary | ICD-10-CM | POA: Diagnosis not present

## 2019-07-12 DIAGNOSIS — M6281 Muscle weakness (generalized): Secondary | ICD-10-CM | POA: Diagnosis not present

## 2019-07-12 DIAGNOSIS — U071 COVID-19: Secondary | ICD-10-CM | POA: Diagnosis not present

## 2019-07-13 DIAGNOSIS — M6281 Muscle weakness (generalized): Secondary | ICD-10-CM | POA: Diagnosis not present

## 2019-07-13 DIAGNOSIS — R2681 Unsteadiness on feet: Secondary | ICD-10-CM | POA: Diagnosis not present

## 2019-07-13 DIAGNOSIS — U071 COVID-19: Secondary | ICD-10-CM | POA: Diagnosis not present

## 2019-07-13 DIAGNOSIS — R262 Difficulty in walking, not elsewhere classified: Secondary | ICD-10-CM | POA: Diagnosis not present

## 2019-07-14 DIAGNOSIS — M6281 Muscle weakness (generalized): Secondary | ICD-10-CM | POA: Diagnosis not present

## 2019-07-14 DIAGNOSIS — R262 Difficulty in walking, not elsewhere classified: Secondary | ICD-10-CM | POA: Diagnosis not present

## 2019-07-14 DIAGNOSIS — U071 COVID-19: Secondary | ICD-10-CM | POA: Diagnosis not present

## 2019-07-14 DIAGNOSIS — R2681 Unsteadiness on feet: Secondary | ICD-10-CM | POA: Diagnosis not present

## 2019-07-17 DIAGNOSIS — R262 Difficulty in walking, not elsewhere classified: Secondary | ICD-10-CM | POA: Diagnosis not present

## 2019-07-17 DIAGNOSIS — U071 COVID-19: Secondary | ICD-10-CM | POA: Diagnosis not present

## 2019-07-17 DIAGNOSIS — R2681 Unsteadiness on feet: Secondary | ICD-10-CM | POA: Diagnosis not present

## 2019-07-17 DIAGNOSIS — M6281 Muscle weakness (generalized): Secondary | ICD-10-CM | POA: Diagnosis not present

## 2019-07-18 DIAGNOSIS — R262 Difficulty in walking, not elsewhere classified: Secondary | ICD-10-CM | POA: Diagnosis not present

## 2019-07-18 DIAGNOSIS — R2681 Unsteadiness on feet: Secondary | ICD-10-CM | POA: Diagnosis not present

## 2019-07-18 DIAGNOSIS — U071 COVID-19: Secondary | ICD-10-CM | POA: Diagnosis not present

## 2019-07-18 DIAGNOSIS — M6281 Muscle weakness (generalized): Secondary | ICD-10-CM | POA: Diagnosis not present

## 2019-07-19 DIAGNOSIS — U071 COVID-19: Secondary | ICD-10-CM | POA: Diagnosis not present

## 2019-07-19 DIAGNOSIS — R262 Difficulty in walking, not elsewhere classified: Secondary | ICD-10-CM | POA: Diagnosis not present

## 2019-07-19 DIAGNOSIS — R2681 Unsteadiness on feet: Secondary | ICD-10-CM | POA: Diagnosis not present

## 2019-07-19 DIAGNOSIS — M6281 Muscle weakness (generalized): Secondary | ICD-10-CM | POA: Diagnosis not present

## 2019-07-20 DIAGNOSIS — R262 Difficulty in walking, not elsewhere classified: Secondary | ICD-10-CM | POA: Diagnosis not present

## 2019-07-20 DIAGNOSIS — M6281 Muscle weakness (generalized): Secondary | ICD-10-CM | POA: Diagnosis not present

## 2019-07-20 DIAGNOSIS — U071 COVID-19: Secondary | ICD-10-CM | POA: Diagnosis not present

## 2019-07-20 DIAGNOSIS — R2681 Unsteadiness on feet: Secondary | ICD-10-CM | POA: Diagnosis not present

## 2019-07-21 DIAGNOSIS — R262 Difficulty in walking, not elsewhere classified: Secondary | ICD-10-CM | POA: Diagnosis not present

## 2019-07-21 DIAGNOSIS — R2681 Unsteadiness on feet: Secondary | ICD-10-CM | POA: Diagnosis not present

## 2019-07-21 DIAGNOSIS — U071 COVID-19: Secondary | ICD-10-CM | POA: Diagnosis not present

## 2019-07-21 DIAGNOSIS — M6281 Muscle weakness (generalized): Secondary | ICD-10-CM | POA: Diagnosis not present

## 2019-07-24 DIAGNOSIS — R262 Difficulty in walking, not elsewhere classified: Secondary | ICD-10-CM | POA: Diagnosis not present

## 2019-07-24 DIAGNOSIS — R2681 Unsteadiness on feet: Secondary | ICD-10-CM | POA: Diagnosis not present

## 2019-07-24 DIAGNOSIS — U071 COVID-19: Secondary | ICD-10-CM | POA: Diagnosis not present

## 2019-07-24 DIAGNOSIS — M6281 Muscle weakness (generalized): Secondary | ICD-10-CM | POA: Diagnosis not present

## 2019-07-25 DIAGNOSIS — R262 Difficulty in walking, not elsewhere classified: Secondary | ICD-10-CM | POA: Diagnosis not present

## 2019-07-25 DIAGNOSIS — R2681 Unsteadiness on feet: Secondary | ICD-10-CM | POA: Diagnosis not present

## 2019-07-25 DIAGNOSIS — M6281 Muscle weakness (generalized): Secondary | ICD-10-CM | POA: Diagnosis not present

## 2019-07-25 DIAGNOSIS — U071 COVID-19: Secondary | ICD-10-CM | POA: Diagnosis not present

## 2019-07-26 DIAGNOSIS — R262 Difficulty in walking, not elsewhere classified: Secondary | ICD-10-CM | POA: Diagnosis not present

## 2019-07-26 DIAGNOSIS — U071 COVID-19: Secondary | ICD-10-CM | POA: Diagnosis not present

## 2019-07-26 DIAGNOSIS — M6281 Muscle weakness (generalized): Secondary | ICD-10-CM | POA: Diagnosis not present

## 2019-07-26 DIAGNOSIS — R2681 Unsteadiness on feet: Secondary | ICD-10-CM | POA: Diagnosis not present

## 2019-07-27 DIAGNOSIS — J449 Chronic obstructive pulmonary disease, unspecified: Secondary | ICD-10-CM | POA: Diagnosis not present

## 2019-07-27 DIAGNOSIS — R262 Difficulty in walking, not elsewhere classified: Secondary | ICD-10-CM | POA: Diagnosis not present

## 2019-07-27 DIAGNOSIS — F0390 Unspecified dementia without behavioral disturbance: Secondary | ICD-10-CM | POA: Diagnosis not present

## 2019-07-27 DIAGNOSIS — I5020 Unspecified systolic (congestive) heart failure: Secondary | ICD-10-CM | POA: Diagnosis not present

## 2019-07-27 DIAGNOSIS — U071 COVID-19: Secondary | ICD-10-CM | POA: Diagnosis not present

## 2019-07-27 DIAGNOSIS — R2681 Unsteadiness on feet: Secondary | ICD-10-CM | POA: Diagnosis not present

## 2019-07-27 DIAGNOSIS — M6281 Muscle weakness (generalized): Secondary | ICD-10-CM | POA: Diagnosis not present

## 2019-08-10 DIAGNOSIS — E0590 Thyrotoxicosis, unspecified without thyrotoxic crisis or storm: Secondary | ICD-10-CM | POA: Diagnosis not present

## 2019-08-25 DIAGNOSIS — E785 Hyperlipidemia, unspecified: Secondary | ICD-10-CM | POA: Diagnosis not present

## 2019-09-05 DIAGNOSIS — Z87898 Personal history of other specified conditions: Secondary | ICD-10-CM | POA: Diagnosis not present

## 2019-09-05 DIAGNOSIS — J439 Emphysema, unspecified: Secondary | ICD-10-CM | POA: Diagnosis not present

## 2019-09-05 DIAGNOSIS — F0281 Dementia in other diseases classified elsewhere with behavioral disturbance: Secondary | ICD-10-CM | POA: Diagnosis not present

## 2019-09-05 DIAGNOSIS — I5040 Unspecified combined systolic (congestive) and diastolic (congestive) heart failure: Secondary | ICD-10-CM | POA: Diagnosis not present

## 2019-09-19 DIAGNOSIS — J449 Chronic obstructive pulmonary disease, unspecified: Secondary | ICD-10-CM | POA: Diagnosis not present

## 2019-09-19 DIAGNOSIS — F0391 Unspecified dementia with behavioral disturbance: Secondary | ICD-10-CM | POA: Diagnosis not present

## 2019-09-19 DIAGNOSIS — U071 COVID-19: Secondary | ICD-10-CM | POA: Diagnosis not present

## 2019-09-19 DIAGNOSIS — I2510 Atherosclerotic heart disease of native coronary artery without angina pectoris: Secondary | ICD-10-CM | POA: Diagnosis not present

## 2019-09-20 DIAGNOSIS — F0391 Unspecified dementia with behavioral disturbance: Secondary | ICD-10-CM | POA: Diagnosis not present

## 2019-09-20 DIAGNOSIS — G4700 Insomnia, unspecified: Secondary | ICD-10-CM | POA: Diagnosis not present

## 2019-09-20 DIAGNOSIS — U071 COVID-19: Secondary | ICD-10-CM | POA: Diagnosis not present

## 2019-09-20 DIAGNOSIS — I5040 Unspecified combined systolic (congestive) and diastolic (congestive) heart failure: Secondary | ICD-10-CM | POA: Diagnosis not present

## 2019-09-25 DIAGNOSIS — Z20828 Contact with and (suspected) exposure to other viral communicable diseases: Secondary | ICD-10-CM | POA: Diagnosis not present

## 2019-10-03 DIAGNOSIS — Z20828 Contact with and (suspected) exposure to other viral communicable diseases: Secondary | ICD-10-CM | POA: Diagnosis not present

## 2019-10-03 DIAGNOSIS — Z03818 Encounter for observation for suspected exposure to other biological agents ruled out: Secondary | ICD-10-CM | POA: Diagnosis not present

## 2019-10-04 DIAGNOSIS — E039 Hypothyroidism, unspecified: Secondary | ICD-10-CM | POA: Diagnosis not present

## 2019-10-17 DIAGNOSIS — Z03818 Encounter for observation for suspected exposure to other biological agents ruled out: Secondary | ICD-10-CM | POA: Diagnosis not present

## 2019-10-17 DIAGNOSIS — Z20828 Contact with and (suspected) exposure to other viral communicable diseases: Secondary | ICD-10-CM | POA: Diagnosis not present

## 2019-10-23 DIAGNOSIS — Z20828 Contact with and (suspected) exposure to other viral communicable diseases: Secondary | ICD-10-CM | POA: Diagnosis not present

## 2019-10-23 DIAGNOSIS — Z03818 Encounter for observation for suspected exposure to other biological agents ruled out: Secondary | ICD-10-CM | POA: Diagnosis not present

## 2019-10-25 DIAGNOSIS — I5020 Unspecified systolic (congestive) heart failure: Secondary | ICD-10-CM | POA: Diagnosis not present

## 2019-10-25 DIAGNOSIS — J449 Chronic obstructive pulmonary disease, unspecified: Secondary | ICD-10-CM | POA: Diagnosis not present

## 2019-10-25 DIAGNOSIS — F0391 Unspecified dementia with behavioral disturbance: Secondary | ICD-10-CM | POA: Diagnosis not present

## 2019-10-25 DIAGNOSIS — U071 COVID-19: Secondary | ICD-10-CM | POA: Diagnosis not present

## 2019-10-30 DIAGNOSIS — Z03818 Encounter for observation for suspected exposure to other biological agents ruled out: Secondary | ICD-10-CM | POA: Diagnosis not present

## 2019-10-30 DIAGNOSIS — Z20828 Contact with and (suspected) exposure to other viral communicable diseases: Secondary | ICD-10-CM | POA: Diagnosis not present

## 2019-11-06 DIAGNOSIS — Z03818 Encounter for observation for suspected exposure to other biological agents ruled out: Secondary | ICD-10-CM | POA: Diagnosis not present

## 2019-11-06 DIAGNOSIS — Z20828 Contact with and (suspected) exposure to other viral communicable diseases: Secondary | ICD-10-CM | POA: Diagnosis not present

## 2019-11-13 DIAGNOSIS — Z1152 Encounter for screening for COVID-19: Secondary | ICD-10-CM | POA: Diagnosis not present

## 2019-11-13 DIAGNOSIS — Z20822 Contact with and (suspected) exposure to covid-19: Secondary | ICD-10-CM | POA: Diagnosis not present

## 2019-11-17 ENCOUNTER — Encounter (HOSPITAL_COMMUNITY): Payer: Self-pay

## 2019-11-17 ENCOUNTER — Inpatient Hospital Stay (HOSPITAL_COMMUNITY)
Admission: EM | Admit: 2019-11-17 | Discharge: 2019-11-23 | DRG: 522 | Disposition: A | Discharge status: Skilled Nursing Facility | Payer: Medicare Other | Source: Skilled Nursing Facility | Attending: Internal Medicine | Admitting: Internal Medicine

## 2019-11-17 ENCOUNTER — Emergency Department (HOSPITAL_COMMUNITY): Payer: Medicare Other

## 2019-11-17 DIAGNOSIS — Z66 Do not resuscitate: Secondary | ICD-10-CM | POA: Diagnosis present

## 2019-11-17 DIAGNOSIS — I255 Ischemic cardiomyopathy: Secondary | ICD-10-CM | POA: Diagnosis present

## 2019-11-17 DIAGNOSIS — R0902 Hypoxemia: Secondary | ICD-10-CM | POA: Diagnosis not present

## 2019-11-17 DIAGNOSIS — F0390 Unspecified dementia without behavioral disturbance: Secondary | ICD-10-CM | POA: Diagnosis present

## 2019-11-17 DIAGNOSIS — W19XXXA Unspecified fall, initial encounter: Secondary | ICD-10-CM | POA: Diagnosis present

## 2019-11-17 DIAGNOSIS — E860 Dehydration: Secondary | ICD-10-CM | POA: Diagnosis present

## 2019-11-17 DIAGNOSIS — S72001A Fracture of unspecified part of neck of right femur, initial encounter for closed fracture: Principal | ICD-10-CM | POA: Diagnosis present

## 2019-11-17 DIAGNOSIS — I110 Hypertensive heart disease with heart failure: Secondary | ICD-10-CM | POA: Diagnosis present

## 2019-11-17 DIAGNOSIS — I2510 Atherosclerotic heart disease of native coronary artery without angina pectoris: Secondary | ICD-10-CM

## 2019-11-17 DIAGNOSIS — I252 Old myocardial infarction: Secondary | ICD-10-CM

## 2019-11-17 DIAGNOSIS — Z79899 Other long term (current) drug therapy: Secondary | ICD-10-CM

## 2019-11-17 DIAGNOSIS — E890 Postprocedural hypothyroidism: Secondary | ICD-10-CM | POA: Diagnosis present

## 2019-11-17 DIAGNOSIS — M25551 Pain in right hip: Secondary | ICD-10-CM | POA: Diagnosis not present

## 2019-11-17 DIAGNOSIS — Z20822 Contact with and (suspected) exposure to covid-19: Secondary | ICD-10-CM | POA: Diagnosis present

## 2019-11-17 DIAGNOSIS — R404 Transient alteration of awareness: Secondary | ICD-10-CM | POA: Diagnosis not present

## 2019-11-17 DIAGNOSIS — J449 Chronic obstructive pulmonary disease, unspecified: Secondary | ICD-10-CM | POA: Diagnosis present

## 2019-11-17 DIAGNOSIS — Z955 Presence of coronary angioplasty implant and graft: Secondary | ICD-10-CM

## 2019-11-17 DIAGNOSIS — I5022 Chronic systolic (congestive) heart failure: Secondary | ICD-10-CM

## 2019-11-17 DIAGNOSIS — F17210 Nicotine dependence, cigarettes, uncomplicated: Secondary | ICD-10-CM | POA: Diagnosis present

## 2019-11-17 DIAGNOSIS — D62 Acute posthemorrhagic anemia: Secondary | ICD-10-CM | POA: Diagnosis not present

## 2019-11-17 DIAGNOSIS — E785 Hyperlipidemia, unspecified: Secondary | ICD-10-CM | POA: Diagnosis present

## 2019-11-17 DIAGNOSIS — Z96649 Presence of unspecified artificial hip joint: Secondary | ICD-10-CM

## 2019-11-17 DIAGNOSIS — I5042 Chronic combined systolic (congestive) and diastolic (congestive) heart failure: Secondary | ICD-10-CM | POA: Diagnosis present

## 2019-11-17 DIAGNOSIS — R52 Pain, unspecified: Secondary | ICD-10-CM | POA: Diagnosis not present

## 2019-11-17 DIAGNOSIS — Z7982 Long term (current) use of aspirin: Secondary | ICD-10-CM

## 2019-11-17 DIAGNOSIS — Y92128 Other place in nursing home as the place of occurrence of the external cause: Secondary | ICD-10-CM

## 2019-11-17 DIAGNOSIS — S72009A Fracture of unspecified part of neck of unspecified femur, initial encounter for closed fracture: Secondary | ICD-10-CM | POA: Diagnosis present

## 2019-11-17 DIAGNOSIS — R0689 Other abnormalities of breathing: Secondary | ICD-10-CM | POA: Diagnosis not present

## 2019-11-17 MED ORDER — SODIUM CHLORIDE 0.9 % IV SOLN: Freq: Once | INTRAVENOUS | Status: AC

## 2019-11-17 NOTE — ED Notes (Signed)
Pt transported to xray 

## 2019-11-17 NOTE — ED Triage Notes (Signed)
Pt in by [facility] from [facility].  Pt was found lying on the floor, unwitnessed fall.  Pt favoring her right hip.

## 2019-11-17 NOTE — ED Notes (Signed)
Pt placed in trendelenburg  Burgess Amor, PA at bedside.

## 2019-11-17 NOTE — ED Notes (Signed)
Pt back from CT

## 2019-11-18 ENCOUNTER — Inpatient Hospital Stay (HOSPITAL_COMMUNITY): Payer: Medicare Other

## 2019-11-18 DIAGNOSIS — R9431 Abnormal electrocardiogram [ECG] [EKG]: Secondary | ICD-10-CM

## 2019-11-18 DIAGNOSIS — W19XXXA Unspecified fall, initial encounter: Secondary | ICD-10-CM | POA: Diagnosis present

## 2019-11-18 DIAGNOSIS — I255 Ischemic cardiomyopathy: Secondary | ICD-10-CM | POA: Diagnosis present

## 2019-11-18 DIAGNOSIS — Y92128 Other place in nursing home as the place of occurrence of the external cause: Secondary | ICD-10-CM | POA: Diagnosis not present

## 2019-11-18 DIAGNOSIS — I5042 Chronic combined systolic (congestive) and diastolic (congestive) heart failure: Secondary | ICD-10-CM

## 2019-11-18 DIAGNOSIS — E860 Dehydration: Secondary | ICD-10-CM | POA: Diagnosis present

## 2019-11-18 DIAGNOSIS — E559 Vitamin D deficiency, unspecified: Secondary | ICD-10-CM | POA: Diagnosis not present

## 2019-11-18 DIAGNOSIS — I2510 Atherosclerotic heart disease of native coronary artery without angina pectoris: Secondary | ICD-10-CM | POA: Diagnosis not present

## 2019-11-18 DIAGNOSIS — F17210 Nicotine dependence, cigarettes, uncomplicated: Secondary | ICD-10-CM | POA: Diagnosis present

## 2019-11-18 DIAGNOSIS — M25551 Pain in right hip: Secondary | ICD-10-CM | POA: Diagnosis present

## 2019-11-18 DIAGNOSIS — J449 Chronic obstructive pulmonary disease, unspecified: Secondary | ICD-10-CM | POA: Diagnosis not present

## 2019-11-18 DIAGNOSIS — E876 Hypokalemia: Secondary | ICD-10-CM | POA: Diagnosis not present

## 2019-11-18 DIAGNOSIS — R410 Disorientation, unspecified: Secondary | ICD-10-CM | POA: Diagnosis not present

## 2019-11-18 DIAGNOSIS — M2550 Pain in unspecified joint: Secondary | ICD-10-CM | POA: Diagnosis not present

## 2019-11-18 DIAGNOSIS — L89152 Pressure ulcer of sacral region, stage 2: Secondary | ICD-10-CM | POA: Diagnosis not present

## 2019-11-18 DIAGNOSIS — Z955 Presence of coronary angioplasty implant and graft: Secondary | ICD-10-CM | POA: Diagnosis not present

## 2019-11-18 DIAGNOSIS — E890 Postprocedural hypothyroidism: Secondary | ICD-10-CM | POA: Diagnosis present

## 2019-11-18 DIAGNOSIS — S72001A Fracture of unspecified part of neck of right femur, initial encounter for closed fracture: Secondary | ICD-10-CM

## 2019-11-18 DIAGNOSIS — I110 Hypertensive heart disease with heart failure: Secondary | ICD-10-CM | POA: Diagnosis present

## 2019-11-18 DIAGNOSIS — E43 Unspecified severe protein-calorie malnutrition: Secondary | ICD-10-CM | POA: Diagnosis not present

## 2019-11-18 DIAGNOSIS — S72001D Fracture of unspecified part of neck of right femur, subsequent encounter for closed fracture with routine healing: Secondary | ICD-10-CM | POA: Diagnosis not present

## 2019-11-18 DIAGNOSIS — Z79899 Other long term (current) drug therapy: Secondary | ICD-10-CM | POA: Diagnosis not present

## 2019-11-18 DIAGNOSIS — Z7982 Long term (current) use of aspirin: Secondary | ICD-10-CM | POA: Diagnosis not present

## 2019-11-18 DIAGNOSIS — I252 Old myocardial infarction: Secondary | ICD-10-CM | POA: Diagnosis not present

## 2019-11-18 DIAGNOSIS — Z96641 Presence of right artificial hip joint: Secondary | ICD-10-CM | POA: Diagnosis not present

## 2019-11-18 DIAGNOSIS — D62 Acute posthemorrhagic anemia: Secondary | ICD-10-CM | POA: Diagnosis not present

## 2019-11-18 DIAGNOSIS — F0391 Unspecified dementia with behavioral disturbance: Secondary | ICD-10-CM | POA: Diagnosis not present

## 2019-11-18 DIAGNOSIS — G4700 Insomnia, unspecified: Secondary | ICD-10-CM | POA: Diagnosis not present

## 2019-11-18 DIAGNOSIS — Z7401 Bed confinement status: Secondary | ICD-10-CM | POA: Diagnosis not present

## 2019-11-18 DIAGNOSIS — Z471 Aftercare following joint replacement surgery: Secondary | ICD-10-CM | POA: Diagnosis not present

## 2019-11-18 DIAGNOSIS — Z66 Do not resuscitate: Secondary | ICD-10-CM | POA: Diagnosis present

## 2019-11-18 DIAGNOSIS — F0390 Unspecified dementia without behavioral disturbance: Secondary | ICD-10-CM

## 2019-11-18 DIAGNOSIS — S72009A Fracture of unspecified part of neck of unspecified femur, initial encounter for closed fracture: Secondary | ICD-10-CM | POA: Diagnosis present

## 2019-11-18 DIAGNOSIS — Z20822 Contact with and (suspected) exposure to covid-19: Secondary | ICD-10-CM | POA: Diagnosis not present

## 2019-11-18 DIAGNOSIS — I5043 Acute on chronic combined systolic (congestive) and diastolic (congestive) heart failure: Secondary | ICD-10-CM | POA: Diagnosis not present

## 2019-11-18 DIAGNOSIS — E785 Hyperlipidemia, unspecified: Secondary | ICD-10-CM | POA: Diagnosis not present

## 2019-11-18 DIAGNOSIS — R52 Pain, unspecified: Secondary | ICD-10-CM | POA: Diagnosis not present

## 2019-11-18 HISTORY — DX: Fracture of unspecified part of neck of right femur, initial encounter for closed fracture: S72.001A

## 2019-11-18 LAB — SARS CORONAVIRUS 2 (TAT 6-24 HRS): SARS Coronavirus 2: NEGATIVE

## 2019-11-18 LAB — CBC WITH DIFFERENTIAL/PLATELET
Abs Immature Granulocytes: 0.07 10*3/uL (ref 0.00–0.07)
Basophils Absolute: 0 10*3/uL (ref 0.0–0.1)
Basophils Relative: 0 %
Eosinophils Absolute: 0 10*3/uL (ref 0.0–0.5)
Eosinophils Relative: 0 %
HCT: 46 % (ref 36.0–46.0)
Hemoglobin: 14.2 g/dL (ref 12.0–15.0)
Immature Granulocytes: 1 %
Lymphocytes Relative: 7 %
Lymphs Abs: 0.7 10*3/uL (ref 0.7–4.0)
MCH: 31.9 pg (ref 26.0–34.0)
MCHC: 30.9 g/dL (ref 30.0–36.0)
MCV: 103.4 fL — ABNORMAL HIGH (ref 80.0–100.0)
Monocytes Absolute: 0.9 10*3/uL (ref 0.1–1.0)
Monocytes Relative: 8 %
Neutro Abs: 8.8 10*3/uL — ABNORMAL HIGH (ref 1.7–7.7)
Neutrophils Relative %: 84 %
Platelets: 125 10*3/uL — ABNORMAL LOW (ref 150–400)
RBC: 4.45 MIL/uL (ref 3.87–5.11)
RDW: 13.2 % (ref 11.5–15.5)
WBC: 10.4 10*3/uL (ref 4.0–10.5)
nRBC: 0 % (ref 0.0–0.2)

## 2019-11-18 LAB — COMPREHENSIVE METABOLIC PANEL
ALT: 34 U/L (ref 0–44)
AST: 46 U/L — ABNORMAL HIGH (ref 15–41)
Albumin: 3.1 g/dL — ABNORMAL LOW (ref 3.5–5.0)
Alkaline Phosphatase: 74 U/L (ref 38–126)
Anion gap: 7 (ref 5–15)
BUN: 21 mg/dL (ref 8–23)
CO2: 26 mmol/L (ref 22–32)
Calcium: 8.1 mg/dL — ABNORMAL LOW (ref 8.9–10.3)
Chloride: 107 mmol/L (ref 98–111)
Creatinine, Ser: 1.05 mg/dL — ABNORMAL HIGH (ref 0.44–1.00)
GFR calc Af Amer: 59 mL/min — ABNORMAL LOW (ref >60)
GFR calc non Af Amer: 51 mL/min — ABNORMAL LOW (ref >60)
Glucose, Bld: 102 mg/dL — ABNORMAL HIGH (ref 70–99)
Potassium: 4.2 mmol/L (ref 3.5–5.1)
Sodium: 140 mmol/L (ref 135–145)
Total Bilirubin: 0.7 mg/dL (ref 0.3–1.2)
Total Protein: 5.6 g/dL — ABNORMAL LOW (ref 6.5–8.1)

## 2019-11-18 LAB — BASIC METABOLIC PANEL
Anion gap: 8 (ref 5–15)
BUN: 27 mg/dL — ABNORMAL HIGH (ref 8–23)
CO2: 24 mmol/L (ref 22–32)
Calcium: 7.7 mg/dL — ABNORMAL LOW (ref 8.9–10.3)
Chloride: 108 mmol/L (ref 98–111)
Creatinine, Ser: 1.25 mg/dL — ABNORMAL HIGH (ref 0.44–1.00)
GFR calc Af Amer: 48 mL/min — ABNORMAL LOW (ref >60)
GFR calc non Af Amer: 41 mL/min — ABNORMAL LOW (ref >60)
Glucose, Bld: 138 mg/dL — ABNORMAL HIGH (ref 70–99)
Potassium: 3.6 mmol/L (ref 3.5–5.1)
Sodium: 140 mmol/L (ref 135–145)

## 2019-11-18 LAB — TSH: TSH: 0.39 u[IU]/mL (ref 0.350–4.500)

## 2019-11-18 LAB — CBC
HCT: 44.6 % (ref 36.0–46.0)
Hemoglobin: 14 g/dL (ref 12.0–15.0)
MCH: 32.1 pg (ref 26.0–34.0)
MCHC: 31.4 g/dL (ref 30.0–36.0)
MCV: 102.3 fL — ABNORMAL HIGH (ref 80.0–100.0)
Platelets: 118 10*3/uL — ABNORMAL LOW (ref 150–400)
RBC: 4.36 MIL/uL (ref 3.87–5.11)
RDW: 13.2 % (ref 11.5–15.5)
WBC: 9.9 10*3/uL (ref 4.0–10.5)
nRBC: 0 % (ref 0.0–0.2)

## 2019-11-18 LAB — TYPE AND SCREEN
ABO/RH(D): O POS
Antibody Screen: NEGATIVE

## 2019-11-18 LAB — ECHOCARDIOGRAM LIMITED
Height: 63 in
Weight: 1439.16 oz

## 2019-11-18 LAB — PROTIME-INR
INR: 1 (ref 0.8–1.2)
Prothrombin Time: 13.5 seconds (ref 11.4–15.2)

## 2019-11-18 LAB — TROPONIN I (HIGH SENSITIVITY): Troponin I (High Sensitivity): 123 ng/L (ref <18)

## 2019-11-18 MED ORDER — ACETAMINOPHEN 650 MG RE SUPP: 650.0000 mg | Freq: Four times a day (QID) | RECTAL | Status: DC | PRN

## 2019-11-18 MED ORDER — ONDANSETRON HCL 4 MG/2ML IJ SOLN
4.0000 mg | Freq: Four times a day (QID) | INTRAMUSCULAR | Status: DC | PRN
  Administered 2019-11-18 – 2019-11-19 (×2): 4 mg via INTRAVENOUS
  Filled 2019-11-18: qty 2

## 2019-11-18 MED ORDER — ACETAMINOPHEN 325 MG PO TABS
650.0000 mg | ORAL_TABLET | Freq: Four times a day (QID) | ORAL | Status: DC | PRN
  Administered 2019-11-21: 650 mg via ORAL
  Filled 2019-11-18: qty 2

## 2019-11-18 MED ORDER — HYDROMORPHONE HCL 1 MG/ML IJ SOLN
1.0000 mg | Freq: Once | INTRAMUSCULAR | Status: AC
  Administered 2019-11-18: 1 mg via INTRAVENOUS
  Filled 2019-11-18: qty 1

## 2019-11-18 MED ORDER — FUROSEMIDE 40 MG PO TABS
20.0000 mg | ORAL_TABLET | ORAL | Status: DC
  Filled 2019-11-18: qty 1

## 2019-11-18 MED ORDER — ENOXAPARIN SODIUM 30 MG/0.3ML ~~LOC~~ SOLN
30.0000 mg | SUBCUTANEOUS | Status: DC
  Administered 2019-11-18 – 2019-11-22 (×4): 30 mg via SUBCUTANEOUS
  Filled 2019-11-18 (×5): qty 0.3

## 2019-11-18 MED ORDER — ASPIRIN 81 MG PO CHEW
81.0000 mg | CHEWABLE_TABLET | Freq: Every day | ORAL | Status: DC
  Administered 2019-11-20 – 2019-11-23 (×4): 81 mg via ORAL
  Filled 2019-11-18 (×6): qty 1

## 2019-11-18 MED ORDER — HALOPERIDOL LACTATE 5 MG/ML IJ SOLN
5.0000 mg | Freq: Once | INTRAMUSCULAR | Status: AC
  Administered 2019-11-18: 5 mg via INTRAMUSCULAR
  Filled 2019-11-18: qty 1

## 2019-11-18 MED ORDER — CARVEDILOL 3.125 MG PO TABS
3.1250 mg | ORAL_TABLET | Freq: Two times a day (BID) | ORAL | Status: DC
  Administered 2019-11-18 – 2019-11-23 (×10): 3.125 mg via ORAL
  Filled 2019-11-18 (×15): qty 1

## 2019-11-18 MED ORDER — ENOXAPARIN SODIUM 40 MG/0.4ML ~~LOC~~ SOLN: 40.0000 mg | SUBCUTANEOUS | Status: DC

## 2019-11-18 MED ORDER — ENSURE ENLIVE PO LIQD
237.0000 mL | Freq: Two times a day (BID) | ORAL | Status: DC
  Administered 2019-11-20 – 2019-11-22 (×5): 237 mL via ORAL
  Filled 2019-11-18 (×5): qty 237

## 2019-11-18 MED ORDER — SODIUM CHLORIDE 0.9 % IV SOLN: INTRAVENOUS | Status: DC

## 2019-11-18 MED ORDER — ATORVASTATIN CALCIUM 40 MG PO TABS
40.0000 mg | ORAL_TABLET | Freq: Every day | ORAL | Status: DC
  Administered 2019-11-19 – 2019-11-22 (×4): 40 mg via ORAL
  Filled 2019-11-18 (×4): qty 1

## 2019-11-18 MED ORDER — FENTANYL CITRATE (PF) 100 MCG/2ML IJ SOLN
50.0000 ug | Freq: Once | INTRAMUSCULAR | Status: AC
  Administered 2019-11-18: 50 ug via INTRAVENOUS
  Filled 2019-11-18: qty 2

## 2019-11-18 MED ORDER — ONDANSETRON HCL 4 MG/2ML IJ SOLN
4.0000 mg | Freq: Once | INTRAMUSCULAR | Status: AC
  Administered 2019-11-18: 4 mg via INTRAVENOUS
  Filled 2019-11-18: qty 2

## 2019-11-18 MED ORDER — HYDROMORPHONE HCL 1 MG/ML IJ SOLN
0.5000 mg | INTRAMUSCULAR | Status: DC | PRN
  Administered 2019-11-18 (×2): 0.5 mg via INTRAVENOUS
  Filled 2019-11-18 (×2): qty 1

## 2019-11-18 MED ORDER — HYDROMORPHONE HCL 1 MG/ML IJ SOLN
0.5000 mg | INTRAMUSCULAR | Status: DC | PRN
  Administered 2019-11-18 – 2019-11-19 (×2): 0.5 mg via INTRAVENOUS
  Filled 2019-11-18 (×2): qty 1

## 2019-11-18 MED ORDER — POTASSIUM CHLORIDE CRYS ER 10 MEQ PO TBCR
10.0000 meq | EXTENDED_RELEASE_TABLET | ORAL | Status: DC
  Administered 2019-11-18 – 2019-11-22 (×3): 10 meq via ORAL
  Filled 2019-11-18 (×4): qty 1

## 2019-11-18 MED ORDER — ADULT MULTIVITAMIN W/MINERALS CH
1.0000 | ORAL_TABLET | Freq: Every day | ORAL | Status: DC
  Administered 2019-11-20 – 2019-11-23 (×4): 1 via ORAL
  Filled 2019-11-18 (×6): qty 1

## 2019-11-18 MED ORDER — QUETIAPINE FUMARATE 25 MG PO TABS
25.0000 mg | ORAL_TABLET | Freq: Two times a day (BID) | ORAL | Status: DC
  Administered 2019-11-18 – 2019-11-23 (×9): 25 mg via ORAL
  Filled 2019-11-18 (×12): qty 1

## 2019-11-18 NOTE — ED Notes (Signed)
Dr Selena Batten made aware of pt pain- new orders received.

## 2019-11-18 NOTE — Progress Notes (Addendum)
  Patient seen and evaluated, chart reviewed, please see EMR for updated orders. Please see full H&P dictated by admitting physician Dr. Selena Batten for same date of service.     Brief Summary:- Ruth Davidson is 79 year old reformed smoker with advanced dementia, CAD (inferior wall MI in 2003 treated with bare-metal stent and later anterior wall MI in 2006 due to distal LAD disease treated medically secondary to diffuse nature of her CAD, NSTEMI in 11/2015), HTN, HLD, hypothyroidism, COPD admitted on 11/18/2019 from [facility] after an unwitnessed fall and found to have right femoral neck fracture with varus angulation, also noted to have soft BP   A/p 1)Right femoral Neck Fracture with Varus Angulation----PTA patient was ambulatory and independent with mobility related ADLs -Patient's daughter understands risk of operative intervention but she would like to proceed if possible in the documentation some quality of life for patient and some ability to continue to perform mobility related ADLs -Per orthopedic surgeon Dr. Romeo Apple at [facility] in [place], who states that given low EF and given lack of cardiology coverage at [facility] during the weekend patient is not acceptable candidate for operative intervention here at this campus   2)CAD--- inferior wall MI in 2003 treated with bare-metal stent and later anterior wall MI in 2006 due to distal LAD disease treated medically secondary to diffuse nature of her CAD, NSTEMI in 11/2015 -Currently chest pain-free -Continue Coreg 3.125 mg twice daily if BP allows, continue aspirin, continue Lipitor 40 mg daily,   3)HFrEF/Ischemic Cardiomyopathy--- Last known EF 15 to 20 % (echo 06/22/2017) -Clinically appears euvolemic and compensated at this time --hold Lasix due to soft BP -Repeat echo as part of preoperative work-up  4)History of Hypothyroidism----currently not on thyroid replacement therapy, check TSH  5)Advanced Dementia with  significant cognitive or memory deficits--- discussed with patient's daughter, patient has been at [facility] since July 2020 due to going AWOL and wandering out of the house and risk of elopement--continue Seroquel 25 mg twice daily -Patient does not have capacity to make informed decisions primary decision maker is patient's daughter  6) social/ethics--- care and advanced directive discussed with daughter (Ms Ruth Davidson----[phone]) ---Patient does NOT have capacity to make informed decisions. Primary decision maker is patient's daughter Ms. Ruth Davidson -Patient is a DNR/DNI with full scope of treatment  7)Disposition--transfer to [facility] campus for possible operative treatment of right femoral neck fracture, may need cardiology preop evaluation  NB Discussed with Dr Burney Gauze surgeon, tentatively patient is scheduled for right hip surgery on 11/19/2019 if remains medically stable -Echocardiogram pending -May Consider cardiology consult for preop clearance based on clinical data as well as echo report -If medically stable, if EF and other echo findings  same or better than prior and patient remains largely asymptomatic may not need cardiology input preop -Patient daughter understands increased perioperative risk given comorbid conditions but she would like to proceed with surgery if possible to give her mother the opportunity to regain mobility and some quality of life  Shon Hale, MD

## 2019-11-18 NOTE — ED Provider Notes (Signed)
ED ECG REPORT   Date: 11/18/2019  Rate: 89  Rhythm: normal sinus rhythm  QRS Axis: normal  Intervals: normal  ST/T Wave abnormalities: LVH with repolarization abnormality  Conduction Disutrbances:LVH  Narrative Interpretation:   Old EKG Reviewed: none available  I have personally reviewed the EKG tracing and agree with the computerized printout as noted.    Devoria Albe, MD 11/18/19 [id number]

## 2019-11-18 NOTE — Consult Note (Addendum)
ORTHOPAEDIC CONSULTATION  REQUESTING PHYSICIAN: Roxan Hockey, MD  Chief Complaint: Fall, right hip pain.  HPI: Ruth Davidson is a 79 y.o. female former smoker with a history of CAD-MI in 2003, 2006, 2017, HTN, HLD, hypothyroidism, COPD, and advanced dementia who is a resident in the memory unit at [facility].  She had an unwitnessed fall and presented to the emergency room at [facility].  X-rays showed a displaced right femoral neck fracture.  Orthopedics was consulted for evaluation.    Past Medical History:  Diagnosis Date  . Cardiac cachexia   . CHF (congestive heart failure) ([place])   . COPD (chronic obstructive pulmonary disease) ([employer])   . Coronary artery disease   . Dementia ([place])   . MI (myocardial infarction) ([place])   . Non-compliance   . Thyroid disease   . Tobacco use    Past Surgical History:  Procedure Laterality Date  . CHOLECYSTECTOMY    . CORONARY ANGIOPLASTY WITH STENT PLACEMENT  2003  . THYROIDECTOMY     goiter/no cancer, per report   Social History   Socioeconomic History  . Marital status: Widowed    Spouse name: Not on file  . Number of children: Not on file  . Years of education: Not on file  . Highest education level: Not on file  Occupational History  . Not on file  Tobacco Use  . Smoking status: Current Every Day Smoker    Packs/day: 1.00    Years: 50.00    Pack years: 50.00    Types: Cigarettes    Start date: 11/02/1958  . Smokeless tobacco: Never Used  Substance and Sexual Activity  . Alcohol use: No    Alcohol/week: 0.0 standard drinks  . Drug use: No  . Sexual activity: Never  Other Topics Concern  . Not on file  Social History Narrative   Widowed. 3 children. Lives with daughter for one year. Retired. Used to work at [employer].    Social Determinants of Health   Financial Resource Strain: Unknown  . Difficulty of Paying Living Expenses: Patient refused  Food Insecurity: Unknown  . Worried About Charity fundraiser in  the Last Year: Patient refused  . Ran Out of Food in the Last Year: Patient refused  Transportation Needs: Unknown  . Lack of Transportation (Medical): Patient refused  . Lack of Transportation (Non-Medical): Patient refused  Physical Activity: Unknown  . Days of Exercise per Week: Patient refused  . Minutes of Exercise per Session: Patient refused  Stress: Unknown  . Feeling of Stress : Patient refused  Social Connections: Unknown  . Frequency of Communication with Friends and Family: Patient refused  . Frequency of Social Gatherings with Friends and Family: Patient refused  . Attends Religious Services: Patient refused  . Active Member of Clubs or Organizations: Patient refused  . Attends Archivist Meetings: Patient refused  . Marital Status: Patient refused   Family History  Problem Relation Age of Onset  . Hypertension Father        Died in war  . Diabetes Mother   . Breast cancer Mother   . Pneumonia Sister   . Hypertension Son    No Known Allergies Prior to Admission medications   Medication Sig Start Date End Date Taking? Authorizing Provider  aspirin EC 81 MG tablet Take 81 mg by mouth daily.   Yes [provider]  carvedilol (COREG) 3.125 MG tablet TAKE 1 TABLET BY MOUTH TWICE DAILY WITH  A MEAL 05/29/19  Yes Johnson, Clanford L, MD  Cholecalciferol 50 MCG (2000 UT) CAPS Take 1 capsule by mouth daily.   Yes [provider]  furosemide (LASIX) 20 MG tablet Take 1 tablet (20 mg total) by mouth every other day. 05/31/19  Yes Johnson, Clanford L, MD  Melatonin 3 MG TABS Take 1 tablet by mouth at bedtime.   Yes [provider]  Multiple Vitamin (MULTIVITAMIN WITH MINERALS) TABS tablet Take 1 tablet by mouth daily. 05/30/19  Yes Johnson, Clanford L, MD  nitroGLYCERIN (NITROSTAT) 0.4 MG SL tablet Place 1 tablet (0.4 mg total) under the tongue every 5 (five) minutes as needed for chest pain (or back pain or arm pain). 01/03/99  Yes Delora Fuel, MD   potassium chloride (K-DUR) 10 MEQ tablet Take 1 tablet (10 mEq total) by mouth every other day. Patient taking differently: Take 10 mEq by mouth daily.  05/31/19  Yes Johnson, Clanford L, MD  QUEtiapine (SEROQUEL) 25 MG tablet Take 1 tablet (25 mg total) by mouth 2 (two) times daily. Patient taking differently: Take 25 mg by mouth at bedtime.  05/29/19  Yes Johnson, Clanford L, MD  simvastatin (ZOCOR) 80 MG tablet Take 1 tablet (80 mg total) by mouth daily with breakfast. 05/22/19  Yes Tat, David, MD  feeding supplement, ENSURE ENLIVE, (ENSURE ENLIVE) LIQD Take 237 mLs by mouth 2 (two) times daily between meals. Patient not taking: Reported on 11/18/2019 05/29/19   Murlean Iba, MD   DG Chest 1 View  Result Date: 11/17/2019 CLINICAL DATA:  Recent right hip fracture EXAM: CHEST  1 VIEW COMPARISON:  05/06/2019 FINDINGS: Cardiac shadow is stable and accentuated by the frontal technique. The lungs are clear. No acute bony abnormality is seen. IMPRESSION: No acute abnormality noted. Electronically Signed   By: Inez Catalina M.D.   On: 11/17/2019 23:32   DG Hip Unilat  With Pelvis 2-3 Views Right  Result Date: 11/17/2019 CLINICAL DATA:  Unwitnessed fall. EXAM: DG HIP (WITH OR WITHOUT PELVIS) 2-3V RIGHT COMPARISON:  None. FINDINGS: There is a right femoral neck fracture with varus angulation. SI joints are symmetric. No subluxation or dislocation. IMPRESSION: Right femoral neck fracture with varus angulation. Electronically Signed   By: Rolm Baptise M.D.   On: 11/17/2019 23:27    Positive ROS: All other systems have been reviewed and were otherwise negative with the exception of those mentioned in the HPI and as above.  Objective: Labs cbc Recent Labs    11/17/19 2330 11/18/19 1029  WBC 10.4 9.9  HGB 14.2 14.0  HCT 46.0 44.6  PLT 125* 118*    Labs inflam No results for input(s): CRP in the last 72 hours.  Invalid input(s): ESR  Labs coag Recent Labs    11/18/19 1029  INR 1.0     Recent Labs    11/17/19 2330 11/18/19 1029  NA 140 140  K 3.6 4.2  CL 108 107  CO2 24 26  GLUCOSE 138* 102*  BUN 27* 21  CREATININE 1.25* 1.05*  CALCIUM 7.7* 8.1*    Physical Exam: Vitals:   11/18/19 1250 11/18/19 1300  BP: 103/65 100/66  Pulse:    Resp: 16 17  Temp:    SpO2:     General: No acute distress.  Supine in bed resting comfortably. Mental status: Oriented to person Neurologic: Not conversant.  Follows some commands Respiratory: No cyanosis, no use of accessory musculature Cardiovascular: No pedal edema GI: Abdomen is soft and non-tender,  non-distended. Skin: Warm and dry.  No lesions in the area of chief complaint  Extremities: Warm and well perfused w/o edema Psychiatric: Patient is competent for consent with normal mood and affect  MUSCULOSKELETAL:  RLE Feet warm.  Pain w/ movement of right hip.  Motor function and sensation grossly intact distally. Other extremities are atraumatic with painless ROM and NVI.  Assessment / Plan: Active Problems:   Dementia (HCC)   Chronic combined systolic and diastolic CHF (congestive heart failure) ([place])   Femoral neck fracture (HCC)   Hip fracture (HCC)   Right femoral neck fracture  Plan for Operative fixation Dr. Mardelle Matte -NPO at midnight -Medicine team to admit and perform pre-op clearance which may include cardiology consultation. -PT/OT post op -Foley/Smart wick okay prn for comfort  -Previously at [facility]  The patient has dementia and does not have the capacity to make informed decisions.  Primary is the patient's daughter.    The risks benefits and alternatives of the procedure were discussed with the patient including but not limited to infection, bleeding, nerve injury, the need for revision surgery, blood clots, cardiopulmonary complications, dislocation, morbidity, mortality, among others.  The patient's daughter Boykin Nearing [phone]) verbalizes understanding and wishes to  proceed.     Weightbearing: Bedrest.  Will amend post op. VTE prophylaxis: SCDs.  Pain control:   Minimize narcotics if able. Follow-up plan: Will follow in acute inpatient post-op phase.  Plan for outpatient follow up with Dr. Mardelle Matte in about 2 weeks. Contact information: Marchia Bond, MD, Roxan Hockey PA-C  Prudencio Burly III PA-C 11/18/2019 2:41 PM   Patient seen and examined, and agree with above.  Plan for right hip hemiarthroplasty is once she is medically optimized.  Marchia Bond, MD.

## 2019-11-18 NOTE — ED Notes (Signed)
This nurse to room- Ruth Davidson, with lab at bedside attempting to get blood, pt swinging hands, scratching, pulling at IV line-pulled out IV extension, blood splattered all over pt and bed- able to save IV- new dressing applied. IV site flushed with 10 ml NS. Pt cleaned up and brief was changed. 3 staff members present for this task.

## 2019-11-18 NOTE — ED Notes (Signed)
Pharmacy aware needing coreg

## 2019-11-18 NOTE — ED Notes (Signed)
Pt found standing at foot of stretcher. Called for assistance to help get pt back into bed. Pt stating she is going to leave. Pt refusing to have monitor cables replaced. Pushing rn's away. Provider made aware. Iv dilaudid given while awaiting other orders. Pt continues to refuse to have monitors replaced. Order for im haldol received and administered. EKG leads, pulse ox, oxygen reapplied.

## 2019-11-18 NOTE — ED Provider Notes (Signed)
[facility] EMERGENCY DEPARTMENT Provider Note   CSN: [id number] Arrival date & time: 11/17/19  2119     History Chief Complaint  Patient presents with  . Fall    hip injury    Ruth Davidson is a 79 y.o. female with history significant for CHF, COPD, CAD with h/o MI and dementia presenting from a local nursing home where she was found on the floor with an unwitnessed fall.  She has been favoring her right hip this this injury occurred.  She denies any other pain or injury.  She was given morphine 6 mg IV by EMS.  HPI     Past Medical History:  Diagnosis Date  . Cardiac cachexia   . CHF (congestive heart failure) (HCC)   . COPD (chronic obstructive pulmonary disease) (HCC)   . Coronary artery disease   . Dementia (HCC)   . MI (myocardial infarction) (HCC)   . Non-compliance   . Thyroid disease   . Tobacco use     Patient Active Problem List   Diagnosis Date Noted  . Hyperthyroidism 05/18/2019  . Hypokalemia 05/17/2019  . Protein-calorie malnutrition, severe 05/17/2019  . Severe protein-calorie malnutrition (HCC) 05/17/2019  . Palliative care by specialist   . Failure to thrive in adult   . Orthostatic hypotension 06/22/2017  . Chronic combined systolic and diastolic CHF (congestive heart failure) (HCC) 06/22/2017  . Goals of care, counseling/discussion   . Palliative care encounter   . Pre-syncope 06/21/2017  . Subdural hematoma (HCC) 06/21/2017  . Acute on chronic systolic CHF (congestive heart failure) (HCC) 08/28/2016  . Noncompliance 08/28/2016  . 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 Emphysema of lung (HCC) 06/11/2016  . 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 Dementia (HCC) 06/11/2016  . NSVT (nonsustained ventricular tachycardia) (HCC) 04/15/2016  . Acute systolic CHF (congestive heart failure) (HCC) 04/15/2016  . Community acquired pneumonia   . CAD S/P remote PCI 08/30/2013  . Hyperlipidemia 08/30/2013  . Tobacco abuse 08/30/2013    Past  Surgical History:  Procedure Laterality Date  . CHOLECYSTECTOMY    . CORONARY ANGIOPLASTY WITH STENT PLACEMENT  2003  . THYROIDECTOMY     goiter/no cancer, per report     OB History   No obstetric history on file.     Family History  Problem Relation Age of Onset  . Hypertension Father        Died in war  . Diabetes Mother   . Breast cancer Mother   . Pneumonia Sister   . Hypertension Son     Social History   Tobacco Use  . Smoking status: Current Every Day Smoker    Packs/day: 1.00    Years: 50.00    Pack years: 50.00    Types: Cigarettes    Start date: 11/02/1958  . Smokeless tobacco: Never Used  Substance Use Topics  . Alcohol use: No    Alcohol/week: 0.0 standard drinks  . Drug use: No    Home Medications Prior to Admission medications   Medication Sig Start Date End Date Taking? Authorizing Provider  aspirin 81 MG chewable tablet Chew 1 tablet (81 mg total) by mouth daily. Restart on 06/28/17 Patient not taking: Reported on 05/06/2019 06/28/17   Tat, Onalee Hua, MD  carvedilol (COREG) 3.125 MG tablet TAKE 1 TABLET BY MOUTH TWICE DAILY WITH A MEAL 05/29/19   Johnson, Clanford L, MD  feeding supplement, ENSURE ENLIVE, (ENSURE ENLIVE) LIQD Take 237 mLs by mouth 2 (two) times daily between meals.  05/29/19   Johnson, Clanford L, MD  furosemide (LASIX) 20 MG tablet Take 1 tablet (20 mg total) by mouth every other day. 05/31/19   Cleora Fleet, MD  Multiple Vitamin (MULTIVITAMIN WITH MINERALS) TABS tablet Take 1 tablet by mouth daily. 05/30/19   Johnson, Clanford L, MD  nitroGLYCERIN (NITROSTAT) 0.4 MG SL tablet Place 1 tablet (0.4 mg total) under the tongue every 5 (five) minutes as needed for chest pain (or back pain or arm pain). 11/10/15   Dione Booze, MD  potassium chloride (K-DUR) 10 MEQ tablet Take 1 tablet (10 mEq total) by mouth every other day. 05/31/19   Johnson, Clanford L, MD  QUEtiapine (SEROQUEL) 25 MG tablet Take 1 tablet (25 mg total) by mouth 2 (two) times  daily. 05/29/19   Johnson, Clanford L, MD  simvastatin (ZOCOR) 80 MG tablet Take 1 tablet (80 mg total) by mouth daily with breakfast. 05/22/19   Catarina Hartshorn, MD    Allergies    Patient has no known allergies.  Review of Systems   Review of Systems  Unable to perform ROS: Dementia  Musculoskeletal: Positive for arthralgias.    Physical Exam Updated Vital Signs BP 108/67 (BP Location: Right Arm)   Pulse 95   Temp 97.6 F (36.4 C) (Oral)   Resp 16   Wt 40.8 kg   SpO2 98%   BMI 15.94 kg/m   Physical Exam Constitutional:      Appearance: She is well-developed.  HENT:     Head: Atraumatic.  Cardiovascular:     Rate and Rhythm: Normal rate.     Pulses: Normal pulses.          Dorsalis pedis pulses are 2+ on the right side and 2+ on the left side.     Comments: Pulses equal bilaterally Pulmonary:     Effort: Pulmonary effort is normal.  Abdominal:     General: Abdomen is flat.     Tenderness: There is no abdominal tenderness.  Musculoskeletal:        General: Tenderness present. No swelling or deformity.     Cervical back: Normal range of motion. No tenderness.     Right lower leg: Bony tenderness present. No edema.     Left lower leg: No edema.     Comments: ttp right hip and upper thigh.  Pt holding the leg in extension.  Left leg FROM of the hip and knee without pain.  Moves arms without pain or deficit.    Skin:    General: Skin is warm and dry.  Neurological:     Mental Status: She is alert.     Sensory: No sensory deficit.     Deep Tendon Reflexes: Reflexes normal.     ED Results / Procedures / Treatments   Labs (all labs ordered are listed, but only abnormal results are displayed) Labs Reviewed  CBC WITH DIFFERENTIAL/PLATELET - Abnormal; Notable for the following components:      Result Value   MCV 103.4 (*)    Platelets 125 (*)    Neutro Abs 8.8 (*)    All other components within normal limits  BASIC METABOLIC PANEL - Abnormal; Notable for the following  components:   Glucose, Bld 138 (*)    BUN 27 (*)    Creatinine, Ser 1.25 (*)    Calcium 7.7 (*)    GFR calc non Af Amer 41 (*)    GFR calc Af Amer 48 (*)    All other components  within normal limits    EKG ED ECG REPORT   Date: 11/18/2019  Rate: 89  Rhythm: normal sinus rhythm  QRS Axis: normal  Intervals: borderline prolonged QTc 491  ST/T Wave abnormalities: nonspecific ST/T changes  Conduction Disutrbances:none  Narrative Interpretation:   Old EKG Reviewed: changes noted  I have personally reviewed the EKG tracing and agree with the computerized printout as noted.   Radiology DG Chest 1 View  Result Date: 11/17/2019 CLINICAL DATA:  Recent right hip fracture EXAM: CHEST  1 VIEW COMPARISON:  05/06/2019 FINDINGS: Cardiac shadow is stable and accentuated by the frontal technique. The lungs are clear. No acute bony abnormality is seen. IMPRESSION: No acute abnormality noted. Electronically Signed   By: Inez Catalina M.D.   On: 11/17/2019 23:32   DG Hip Unilat  With Pelvis 2-3 Views Right  Result Date: 11/17/2019 CLINICAL DATA:  Unwitnessed fall. EXAM: DG HIP (WITH OR WITHOUT PELVIS) 2-3V RIGHT COMPARISON:  None. FINDINGS: There is a right femoral neck fracture with varus angulation. SI joints are symmetric. No subluxation or dislocation. IMPRESSION: Right femoral neck fracture with varus angulation. Electronically Signed   By: Rolm Baptise M.D.   On: 11/17/2019 23:27    Procedures Procedures (including critical care time)  Medications Ordered in ED Medications  0.9 %  sodium chloride infusion ( Intravenous Stopped 11/17/19 2246)  fentaNYL (SUBLIMAZE) injection 50 mcg (50 mcg Intravenous Given 11/18/19 0120)  ondansetron (ZOFRAN) injection 4 mg (4 mg Intravenous Given 11/18/19 0120)    ED Course  I have reviewed the triage vital signs and the nursing notes.  Pertinent labs & imaging results that were available during my care of the patient were reviewed by me and considered  in my medical decision making (see chart for details).    MDM Rules/Calculators/A&P                      Pt with right femoral neck fracture.  She was given morphine 6 mg prior to arrival per EMS, she briefly became hypotensive but was in no respiratory distress. Tx with IV fluids.  Labs, ekg and cxr obtained in anticipation of surgical intervention.    Call placed to Dr. Aline Brochure. He will see pt in consult.  Discussed with Dr. Maudie Mercury who will accept for medical admission with anticipation that Dr. Aline Brochure will consult in am.    I tried to contact pt's POA to inform of pt's diagnosis and plan.  Not available.  Final Clinical Impression(s) / ED Diagnoses Final diagnoses:  Closed fracture of right hip, initial encounter [facility])    Rx / [place] Orders ED Discharge Orders    None       Landis Martins 11/18/19 [place], MD 11/19/19 [phone]

## 2019-11-18 NOTE — ED Notes (Signed)
Pt moved to a bed near nursing desk, visible from desk. Pt sleeping now, VSS

## 2019-11-18 NOTE — H&P (Addendum)
TRH H&P    Patient Demographics:    Ruth Davidson, is a 79 y.o. female  MRN: [id number]  DOB - September 21, 1941  Admit Date - 11/17/2019  Referring MD/NP/PA:  Burgess Amor  Outpatient Primary MD for the patient is Avon Gully, MD  Patient coming from: SN[facility]  Chief complaint-   ? Fall unwitnessed.  Pt is c/o right hip pain   HPI:    Ruth Davidson  is a 79 y.o. female,  Hypothyroid Dementia, CAD, CHF (diastolic), Copd apparently had unwitnessed fall and complains of right hip pain. Pt is not willing to cooperate with questioning  In ED T 97.6, P 86, R 14, Bp 80/56  Pox 94% on 2L [place]   Xray R hip IMPRESSION: Right femoral neck fracture with varus angulation.  CXR IMPRESSION: No acute abnormality noted.  Wbc 10.4, Hgb 14.2, Plt 125 Na 140, K 3.6, Bun 27, Creatinine 1.25  ED spoke with Dr. Romeo Apple who requested medicine admission and will see in consult in am per ED.   Pt will be admitted for right femioral neck fracture    Review of systems:    In addition to the HPI above, pt is not willing to cooperate with questioning   No Fever-chills, No Headache, No changes with Vision or hearing, No problems swallowing food or Liquids, No Chest pain, Cough or Shortness of Breath, No Abdominal pain, No Nausea or Vomiting, bowel movements are regular, No Blood in stool or Urine, No dysuria, No new skin rashes or bruises, No new joints pains-aches,  No new weakness, tingling, numbness in any extremity, No recent weight gain or loss, No polyuria, polydypsia or polyphagia, No significant Mental Stressors.  All other systems reviewed and are negative.    Past History of the following :    Past Medical History:  Diagnosis Date  . Cardiac cachexia   . CHF (congestive heart failure) (HCC)   . COPD (chronic obstructive pulmonary disease) (HCC)   . Coronary artery disease   . Dementia  (HCC)   . MI (myocardial infarction) (HCC)   . Non-compliance   . Thyroid disease   . Tobacco use       Past Surgical History:  Procedure Laterality Date  . CHOLECYSTECTOMY    . CORONARY ANGIOPLASTY WITH STENT PLACEMENT  2003  . THYROIDECTOMY     goiter/no cancer, per report      Social History:      Social History   Tobacco Use  . Smoking status: Current Every Day Smoker    Packs/day: 1.00    Years: 50.00    Pack years: 50.00    Types: Cigarettes    Start date: 11/02/1958  . Smokeless tobacco: Never Used  Substance Use Topics  . Alcohol use: No    Alcohol/week: 0.0 standard drinks       Family History :     Family History  Problem Relation Age of Onset  . Hypertension Father        Died in war  . Diabetes Mother   .  Breast cancer Mother   . Pneumonia Sister   . Hypertension Son        Home Medications:   Prior to Admission medications   Medication Sig Start Date End Date Taking? Authorizing Provider  aspirin 81 MG chewable tablet Chew 1 tablet (81 mg total) by mouth daily. Restart on 06/28/17 Patient not taking: Reported on 05/06/2019 06/28/17   Tat, Onalee Hua, MD  carvedilol (COREG) 3.125 MG tablet TAKE 1 TABLET BY MOUTH TWICE DAILY WITH A MEAL 05/29/19   Johnson, Clanford L, MD  feeding supplement, ENSURE ENLIVE, (ENSURE ENLIVE) LIQD Take 237 mLs by mouth 2 (two) times daily between meals. 05/29/19   Johnson, Clanford L, MD  furosemide (LASIX) 20 MG tablet Take 1 tablet (20 mg total) by mouth every other day. 05/31/19   Cleora Fleet, MD  Multiple Vitamin (MULTIVITAMIN WITH MINERALS) TABS tablet Take 1 tablet by mouth daily. 05/30/19   Johnson, Clanford L, MD  nitroGLYCERIN (NITROSTAT) 0.4 MG SL tablet Place 1 tablet (0.4 mg total) under the tongue every 5 (five) minutes as needed for chest pain (or back pain or arm pain). 11/10/15   Dione Booze, MD  potassium chloride (K-DUR) 10 MEQ tablet Take 1 tablet (10 mEq total) by mouth every other day. 05/31/19    Johnson, Clanford L, MD  QUEtiapine (SEROQUEL) 25 MG tablet Take 1 tablet (25 mg total) by mouth 2 (two) times daily. 05/29/19   Cleora Fleet, MD  simvastatin (ZOCOR) 80 MG tablet Take 1 tablet (80 mg total) by mouth daily with breakfast. 05/22/19   Tat, Onalee Hua, MD     Allergies:    No Known Allergies   Physical Exam:   Vitals  Blood pressure 98/60, pulse 88, temperature 97.6 F (36.4 C), temperature source Oral, resp. rate 15, height 5\' 3"  (1.6 m), weight 40.8 kg, SpO2 97 %.  1.  General: axoxo1 (person)  2. Psychiatric: Slightly beligerant  3. Neurologic: Reflexes 2+ symmetric, diffuse with no clonus Pt moves all 4 extremities   4. HEENMT:  Anicteric, pupils 1.50mm symmetric, direct, consensual , near intact Neck: no jvd  5. Respiratory : CTAB  6. Cardiovascular : rrr s1, s2,   7. Gastrointestinal:  Abd: soft, nt, nd, +bs  8. Skin:  Ext: no c/c/e,  Pt has blood on her left hand from disconnecting iv  9.Musculoskeletal:  Pain with movement in right leg    Data Review:    CBC Recent Labs  Lab 11/17/19 2330  WBC 10.4  HGB 14.2  HCT 46.0  PLT 125*  MCV 103.4*  MCH 31.9  MCHC 30.9  RDW 13.2  LYMPHSABS 0.7  MONOABS 0.9  EOSABS 0.0  BASOSABS 0.0   ------------------------------------------------------------------------------------------------------------------  Results for orders placed or performed during the hospital encounter of 11/17/19 (from the past 48 hour(s))  CBC with Differential     Status: Abnormal   Collection Time: 11/17/19 11:30 PM  Result Value Ref Range   WBC 10.4 4.0 - 10.5 K/uL   RBC 4.45 3.87 - 5.11 MIL/uL   Hemoglobin 14.2 12.0 - 15.0 g/dL   HCT 11/19/19 29.9 - 37.1 %   MCV 103.4 (H) 80.0 - 100.0 fL   MCH 31.9 26.0 - 34.0 pg   MCHC 30.9 30.0 - 36.0 g/dL   RDW 69.6 78.9 - 38.1 %   Platelets 125 (L) 150 - 400 K/uL   nRBC 0.0 0.0 - 0.2 %   Neutrophils Relative % 84 %   Neutro Abs 8.8 (  H) 1.7 - 7.7 K/uL   Lymphocytes  Relative 7 %   Lymphs Abs 0.7 0.7 - 4.0 K/uL   Monocytes Relative 8 %   Monocytes Absolute 0.9 0.1 - 1.0 K/uL   Eosinophils Relative 0 %   Eosinophils Absolute 0.0 0.0 - 0.5 K/uL   Basophils Relative 0 %   Basophils Absolute 0.0 0.0 - 0.1 K/uL   Immature Granulocytes 1 %   Abs Immature Granulocytes 0.07 0.00 - 0.07 K/uL    Comment: Performed at [facility], [address] [place], [place] [address]  Basic metabolic panel     Status: Abnormal   Collection Time: 11/17/19 11:30 PM  Result Value Ref Range   Sodium 140 135 - 145 mmol/L   Potassium 3.6 3.5 - 5.1 mmol/L   Chloride 108 98 - 111 mmol/L   CO2 24 22 - 32 mmol/L   Glucose, Bld 138 (H) 70 - 99 mg/dL   BUN 27 (H) 8 - 23 mg/dL   Creatinine, Ser 5.46 (H) 0.44 - 1.00 mg/dL   Calcium 7.7 (L) 8.9 - 10.3 mg/dL   GFR calc non Af Amer 41 (L) >60 mL/min   GFR calc Af Amer 48 (L) >60 mL/min   Anion gap 8 5 - 15    Comment: Performed at [facility], [address] [place], [place] [address]    Chemistries  Recent Labs  Lab 11/17/19 2330  NA 140  K 3.6  CL 108  CO2 24  GLUCOSE 138*  BUN 27*  CREATININE 1.25*  CALCIUM 7.7*   ------------------------------------------------------------------------------------------------------------------  ------------------------------------------------------------------------------------------------------------------ GFR: Estimated Creatinine Clearance: 24.3 mL/min (A) (by C-G formula based on SCr of 1.25 mg/dL (H)). Liver Function Tests: No results for input(s): AST, ALT, ALKPHOS, BILITOT, PROT, ALBUMIN in the last 168 hours. No results for input(s): LIPASE, AMYLASE in the last 168 hours. No results for input(s): AMMONIA in the last 168 hours. Coagulation Profile: No results for input(s): INR, PROTIME in the last 168 hours. Cardiac Enzymes: No results for input(s): CKTOTAL, CKMB, CKMBINDEX, TROPONINI in the last 168 hours. BNP (last 3 results) No results for input(s): PROBNP in the  last 8760 hours. HbA1C: No results for input(s): HGBA1C in the last 72 hours. CBG: No results for input(s): GLUCAP in the last 168 hours. Lipid Profile: No results for input(s): CHOL, HDL, LDLCALC, TRIG, CHOLHDL, LDLDIRECT in the last 72 hours. Thyroid Function Tests: No results for input(s): TSH, T4TOTAL, FREET4, T3FREE, THYROIDAB in the last 72 hours. Anemia Panel: No results for input(s): VITAMINB12, FOLATE, FERRITIN, TIBC, IRON, RETICCTPCT in the last 72 hours.  --------------------------------------------------------------------------------------------------------------- Urine analysis:    Component Value Date/Time   COLORURINE YELLOW 05/17/2019 0020   APPEARANCEUR HAZY (A) 05/17/2019 0020   LABSPEC 1.013 05/17/2019 0020   PHURINE 5.0 05/17/2019 0020   GLUCOSEU NEGATIVE 05/17/2019 0020   HGBUR LARGE (A) 05/17/2019 0020   BILIRUBINUR NEGATIVE 05/17/2019 0020   KETONESUR NEGATIVE 05/17/2019 0020   PROTEINUR 30 (A) 05/17/2019 0020   NITRITE NEGATIVE 05/17/2019 0020   LEUKOCYTESUR TRACE (A) 05/17/2019 0020      Imaging Results:    DG Chest 1 View  Result Date: 11/17/2019 CLINICAL DATA:  Recent right hip fracture EXAM: CHEST  1 VIEW COMPARISON:  05/06/2019 FINDINGS: Cardiac shadow is stable and accentuated by the frontal technique. The lungs are clear. No acute bony abnormality is seen. IMPRESSION: No acute abnormality noted. Electronically Signed   By: Alcide Clever M.D.   On: 11/17/2019 23:32  DG Hip Unilat  With Pelvis 2-3 Views Right  Result Date: 11/17/2019 CLINICAL DATA:  Unwitnessed fall. EXAM: DG HIP (WITH OR WITHOUT PELVIS) 2-3V RIGHT COMPARISON:  None. FINDINGS: There is a right femoral neck fracture with varus angulation. SI joints are symmetric. No subluxation or dislocation. IMPRESSION: Right femoral neck fracture with varus angulation. Electronically Signed   By: Rolm Baptise M.D.   On: 11/17/2019 23:27       Assessment & Plan:    Active Problems:   Femoral  neck fracture (HCC)  R femoral neck fracture NPO Check urinalysis Check trop  Dilaudid 0.5mg  iv q4h prn  Zofran 4mg  iv q6h prn  Orthopedics consulted by ED, appreciate input Check cbc, type and screen, cmp in am  Chronic CHF ( EF 62%, systolic/diastolic) Cont carvedilol 3.125mg  po bid Cont Lasix 40mg  po qday Cont Kcl 81meq po qday  CAD Cont Simvastatin 80mg  po qhs Cont Carvedilol as above Cont Aspirin 81mg  po qday   DVT Prophylaxis-   Lovenox - SCDs    AM Labs Ordered, also please review Full Orders  Family Communication: Admission, patients condition and plan of care including tests being ordered have been discussed with the patient who indicate understanding and agree with the plan and Code Status.  Code Status:  DNR per yellow ticket.  Attempted to contact Boykin Nearing daughter but no  response   Admission status: Inpatient: Based on patients clinical presentation and evaluation of above clinical data, I have made determination that patient meets Inpatient criteria at this time. Pt has right femur fracture and will require surgery, pt will require > 2 nites stay.   Time spent in minutes : 55 minutes   Jani Gravel M.D on 11/18/2019 at 4:13 AM

## 2019-11-18 NOTE — Progress Notes (Signed)
PRELIMINARY NOTE AFTER CHART REVIW   Subjective: RIGHT HIP FRACTURE  Objective: Vital signs in last 24 hours: Temp:  [97.6 F (36.4 C)] 97.6 F (36.4 C) (01/15 2149) Pulse Rate:  [76-105] 99 (01/16 0700) Resp:  [13-22] 15 (01/16 0700) BP: (80-117)/(46-75) 97/46 (01/16 0700) SpO2:  [93 %-100 %] 97 % (01/16 0700) Weight:  [40.8 kg] 40.8 kg (01/16 0200)  Intake/Output from previous day: 01/15 0701 - 01/16 0700 In: 1000 [I.V.:1000] Out: -  Intake/Output this shift: No intake/output data recorded.  Recent Labs    11/17/19 2330  HGB 14.2   Recent Labs    11/17/19 2330  WBC 10.4  RBC 4.45  HCT 46.0  PLT 125*   Recent Labs    11/17/19 2330  NA 140  K 3.6  CL 108  CO2 24  BUN 27*  CREATININE 1.25*  GLUCOSE 138*  CALCIUM 7.7*   No results for input(s): LABPT, INR in the last 72 hours.      Assessment/Plan:   78 YO FEMALE WITH DISPLACED FEMORAL NECK FRACTURE RT LOWER EXTREMITY  DNR STATUS, EF 15-20% 2018, ADV CHF, H/O COPD, DEMENTIA AND ? IF SMOKER  NOT A [facility] FOR SURGERY      Ruth Davidson 11/18/2019, 8:19 AM

## 2019-11-18 NOTE — ED Notes (Signed)
Ruth Davidson was unable to collect blood- DR Selena Batten has seen pt and cancelled labs.

## 2019-11-18 NOTE — Progress Notes (Signed)
  Echocardiogram 2D Echocardiogram has been performed.  Pieter Partridge 11/18/2019, 2:11 PM

## 2019-11-18 NOTE — ED Notes (Signed)
Date and time results received: 11/18/19 1156 (use smartphrase ".now" to insert current time)  Test: troponin Critical Value: 123  Name of Provider Notified: [email or web address]  Orders Received? Or Actions Taken?:

## 2019-11-19 ENCOUNTER — Inpatient Hospital Stay (HOSPITAL_COMMUNITY): Payer: Medicare Other

## 2019-11-19 ENCOUNTER — Inpatient Hospital Stay (HOSPITAL_COMMUNITY): Payer: Medicare Other | Admitting: Anesthesiology

## 2019-11-19 ENCOUNTER — Encounter (HOSPITAL_COMMUNITY)
Admission: EM | Disposition: A | Discharge status: Skilled Nursing Facility | Payer: Self-pay | Source: Skilled Nursing Facility | Attending: Internal Medicine

## 2019-11-19 ENCOUNTER — Other Ambulatory Visit: Payer: Self-pay

## 2019-11-19 ENCOUNTER — Encounter (HOSPITAL_COMMUNITY): Payer: Self-pay | Admitting: Family Medicine

## 2019-11-19 DIAGNOSIS — Z9861 Coronary angioplasty status: Secondary | ICD-10-CM

## 2019-11-19 DIAGNOSIS — I5022 Chronic systolic (congestive) heart failure: Secondary | ICD-10-CM

## 2019-11-19 DIAGNOSIS — I2510 Atherosclerotic heart disease of native coronary artery without angina pectoris: Secondary | ICD-10-CM

## 2019-11-19 HISTORY — PX: HIP ARTHROPLASTY: SHX981

## 2019-11-19 LAB — URINALYSIS, ROUTINE W REFLEX MICROSCOPIC
Bilirubin Urine: NEGATIVE
Glucose, UA: NEGATIVE mg/dL
Ketones, ur: 20 mg/dL — AB
Nitrite: NEGATIVE
Protein, ur: NEGATIVE mg/dL
Specific Gravity, Urine: 1.03 (ref 1.005–1.030)
pH: 5 (ref 5.0–8.0)

## 2019-11-19 LAB — SURGICAL PCR SCREEN
MRSA, PCR: NEGATIVE
Staphylococcus aureus: NEGATIVE

## 2019-11-19 LAB — ABO/RH: ABO/RH(D): O POS

## 2019-11-19 LAB — TYPE AND SCREEN
ABO/RH(D): O POS
Antibody Screen: NEGATIVE

## 2019-11-19 SURGERY — HEMIARTHROPLASTY, HIP, DIRECT ANTERIOR APPROACH, FOR FRACTURE
Anesthesia: Monitor Anesthesia Care | Site: Hip | Laterality: Right

## 2019-11-19 MED ORDER — METOCLOPRAMIDE HCL 5 MG/ML IJ SOLN: 5.0000 mg | Freq: Three times a day (TID) | INTRAMUSCULAR | Status: DC | PRN

## 2019-11-19 MED ORDER — ACETAMINOPHEN 10 MG/ML IV SOLN
INTRAVENOUS | Status: AC
  Filled 2019-11-19: qty 100

## 2019-11-19 MED ORDER — KETAMINE HCL 50 MG/5ML IJ SOSY
PREFILLED_SYRINGE | INTRAMUSCULAR | Status: AC
  Filled 2019-11-19: qty 5

## 2019-11-19 MED ORDER — KETAMINE HCL 10 MG/ML IJ SOLN
INTRAMUSCULAR | Status: DC | PRN
  Administered 2019-11-19 (×3): 10 mg via INTRAVENOUS
  Administered 2019-11-19: 20 mg via INTRAVENOUS

## 2019-11-19 MED ORDER — LACTATED RINGERS IV SOLN: INTRAVENOUS | Status: DC

## 2019-11-19 MED ORDER — POVIDONE-IODINE 10 % EX SWAB: 2.0000 "application " | Freq: Once | CUTANEOUS | Status: DC

## 2019-11-19 MED ORDER — SODIUM CHLORIDE 0.9 % IR SOLN
Status: DC | PRN
  Administered 2019-11-19: 1000 mL

## 2019-11-19 MED ORDER — DOCUSATE SODIUM 100 MG PO CAPS
100.0000 mg | ORAL_CAPSULE | Freq: Two times a day (BID) | ORAL | Status: DC
  Administered 2019-11-19 – 2019-11-23 (×8): 100 mg via ORAL
  Filled 2019-11-19 (×9): qty 1

## 2019-11-19 MED ORDER — HYDROCODONE-ACETAMINOPHEN 5-325 MG PO TABS
1.0000 | ORAL_TABLET | ORAL | Status: DC | PRN
  Administered 2019-11-20 – 2019-11-23 (×6): 1 via ORAL
  Filled 2019-11-19 (×7): qty 1

## 2019-11-19 MED ORDER — BUPIVACAINE HCL (PF) 0.25 % IJ SOLN
INTRAMUSCULAR | Status: AC
  Filled 2019-11-19: qty 10

## 2019-11-19 MED ORDER — VASOPRESSIN 20 UNIT/ML IV SOLN
INTRAVENOUS | Status: AC
  Filled 2019-11-19: qty 1

## 2019-11-19 MED ORDER — ACETAMINOPHEN 10 MG/ML IV SOLN: 1000.0000 mg | Freq: Once | INTRAVENOUS | Status: AC

## 2019-11-19 MED ORDER — HYDROMORPHONE HCL 1 MG/ML IJ SOLN
0.2500 mg | INTRAMUSCULAR | Status: DC | PRN
  Administered 2019-11-19 – 2019-11-20 (×3): 0.5 mg via INTRAVENOUS
  Filled 2019-11-19 (×4): qty 1

## 2019-11-19 MED ORDER — TRANEXAMIC ACID-NACL 1000-0.7 MG/100ML-% IV SOLN
1000.0000 mg | INTRAVENOUS | Status: AC
  Administered 2019-11-19: 1000 mg via INTRAVENOUS
  Filled 2019-11-19: qty 100

## 2019-11-19 MED ORDER — ACETAMINOPHEN 10 MG/ML IV SOLN
1000.0000 mg | Freq: Four times a day (QID) | INTRAVENOUS | Status: DC
  Administered 2019-11-19: 1000 mg via INTRAVENOUS

## 2019-11-19 MED ORDER — METOCLOPRAMIDE HCL 5 MG PO TABS: 5.0000 mg | ORAL_TABLET | Freq: Three times a day (TID) | ORAL | Status: DC | PRN

## 2019-11-19 MED ORDER — ACETAMINOPHEN 500 MG PO TABS
1000.0000 mg | ORAL_TABLET | Freq: Once | ORAL | Status: AC
  Administered 2019-11-19: 1000 mg via ORAL
  Filled 2019-11-19: qty 2

## 2019-11-19 MED ORDER — CEFAZOLIN SODIUM-DEXTROSE 2-4 GM/100ML-% IV SOLN
2.0000 g | Freq: Two times a day (BID) | INTRAVENOUS | Status: AC
  Administered 2019-11-19: 2 g via INTRAVENOUS
  Filled 2019-11-19: qty 100

## 2019-11-19 MED ORDER — CHLORHEXIDINE GLUCONATE 4 % EX LIQD
60.0000 mL | Freq: Once | CUTANEOUS | Status: AC
  Administered 2019-11-19: 4 via TOPICAL
  Filled 2019-11-19: qty 60

## 2019-11-19 MED ORDER — PHENYLEPHRINE HCL-NACL 10-0.9 MG/250ML-% IV SOLN
INTRAVENOUS | Status: DC | PRN
  Administered 2019-11-19: 25 ug/min via INTRAVENOUS

## 2019-11-19 MED ORDER — HYDROCODONE-ACETAMINOPHEN 5-325 MG PO TABS: 1.0000 | ORAL_TABLET | Freq: Four times a day (QID) | ORAL | 0 refills | Status: DC | PRN

## 2019-11-19 MED ORDER — ONDANSETRON HCL 4 MG/2ML IJ SOLN: 4.0000 mg | Freq: Four times a day (QID) | INTRAMUSCULAR | Status: DC | PRN

## 2019-11-19 MED ORDER — PROPOFOL 500 MG/50ML IV EMUL
INTRAVENOUS | Status: DC | PRN
  Administered 2019-11-19: 20 ug/kg/min via INTRAVENOUS

## 2019-11-19 MED ORDER — CEFAZOLIN SODIUM-DEXTROSE 2-4 GM/100ML-% IV SOLN
2.0000 g | INTRAVENOUS | Status: AC
  Administered 2019-11-19: 2 g via INTRAVENOUS
  Filled 2019-11-19: qty 100

## 2019-11-19 MED ORDER — POLYETHYLENE GLYCOL 3350 17 G PO PACK: 17.0000 g | PACK | Freq: Every day | ORAL | Status: DC | PRN

## 2019-11-19 MED ORDER — FLEET ENEMA 7-19 GM/118ML RE ENEM: 1.0000 | ENEMA | Freq: Once | RECTAL | Status: DC | PRN

## 2019-11-19 MED ORDER — ENSURE PRE-SURGERY PO LIQD
296.0000 mL | Freq: Once | ORAL | Status: DC
  Filled 2019-11-19: qty 296

## 2019-11-19 MED ORDER — ACETAMINOPHEN 500 MG PO TABS
500.0000 mg | ORAL_TABLET | Freq: Four times a day (QID) | ORAL | Status: AC
  Administered 2019-11-20 (×2): 500 mg via ORAL
  Filled 2019-11-19 (×2): qty 1

## 2019-11-19 MED ORDER — VASOPRESSIN 20 UNIT/ML IV SOLN
INTRAVENOUS | Status: DC | PRN
  Administered 2019-11-19 (×4): 1 m[IU] via INTRAVENOUS

## 2019-11-19 MED ORDER — CHLORHEXIDINE GLUCONATE CLOTH 2 % EX PADS
6.0000 | MEDICATED_PAD | Freq: Every day | CUTANEOUS | Status: DC
  Administered 2019-11-19 – 2019-11-22 (×4): 6 via TOPICAL

## 2019-11-19 MED ORDER — BISACODYL 10 MG RE SUPP: 10.0000 mg | Freq: Every day | RECTAL | Status: DC | PRN

## 2019-11-19 MED ORDER — ENOXAPARIN SODIUM 30 MG/0.3ML ~~LOC~~ SOLN: 30.0000 mg | SUBCUTANEOUS | 0 refills | Status: DC

## 2019-11-19 MED ORDER — ALBUMIN HUMAN 5 % IV SOLN: INTRAVENOUS | Status: DC | PRN

## 2019-11-19 MED ORDER — ONDANSETRON HCL 4 MG PO TABS: 4.0000 mg | ORAL_TABLET | Freq: Four times a day (QID) | ORAL | Status: DC | PRN

## 2019-11-19 SURGICAL SUPPLY — 74 items
APL SKNCLS STERI-STRIP NONHPOA (GAUZE/BANDAGES/DRESSINGS) ×1
BENZOIN TINCTURE PRP APPL 2/3 (GAUZE/BANDAGES/DRESSINGS) ×2 IMPLANT
BLADE SAW SGTL 18.5X63.X.64 HD (BLADE) ×2 IMPLANT
BRUSH FEMORAL CANAL (MISCELLANEOUS) IMPLANT
CLOSURE STERI-STRIP 1/4X4 (GAUZE/BANDAGES/DRESSINGS) ×1 IMPLANT
CLSR STERI-STRIP ANTIMIC 1/2X4 (GAUZE/BANDAGES/DRESSINGS) ×2 IMPLANT
COVER BACK TABLE 24X17X13 BIG (DRAPES) IMPLANT
COVER SURGICAL LIGHT HANDLE (MISCELLANEOUS) ×2 IMPLANT
COVER WAND RF STERILE (DRAPES) ×2 IMPLANT
DRAPE IMP U-DRAPE 54X76 (DRAPES) ×2 IMPLANT
DRAPE INCISE IOBAN 66X45 STRL (DRAPES) IMPLANT
DRAPE ORTHO SPLIT 77X108 STRL (DRAPES) ×4
DRAPE SURG ORHT 6 SPLT 77X108 (DRAPES) ×2 IMPLANT
DRAPE U-SHAPE 47X51 STRL (DRAPES) ×2 IMPLANT
DRSG MEPILEX BORDER 4X12 (GAUZE/BANDAGES/DRESSINGS) IMPLANT
DRSG MEPILEX BORDER 4X8 (GAUZE/BANDAGES/DRESSINGS) ×1 IMPLANT
DURAPREP 26ML APPLICATOR (WOUND CARE) ×2 IMPLANT
ELECT BLADE 6.5 EXT (BLADE) IMPLANT
ELECT CAUTERY BLADE 6.4 (BLADE) ×2 IMPLANT
ELECT REM PT RETURN 9FT ADLT (ELECTROSURGICAL) ×2
ELECTRODE REM PT RTRN 9FT ADLT (ELECTROSURGICAL) ×1 IMPLANT
FACESHIELD WRAPAROUND (MASK) ×2 IMPLANT
FACESHIELD WRAPAROUND OR TEAM (MASK) ×2 IMPLANT
GLOVE BIO SURGEON STRL SZ7.5 (GLOVE) ×3 IMPLANT
GLOVE BIOGEL PI IND STRL 7.5 (GLOVE) IMPLANT
GLOVE BIOGEL PI INDICATOR 7.5 (GLOVE) ×1
GLOVE BIOGEL PI ORTHO PRO SZ8 (GLOVE) ×1
GLOVE INDICATOR 7.5 STRL GRN (GLOVE) ×2 IMPLANT
GLOVE ORTHO TXT STRL SZ7.5 (GLOVE) ×3 IMPLANT
GLOVE PI ORTHO PRO STRL SZ8 (GLOVE) ×1 IMPLANT
GLOVE SS BIOGEL STRL SZ 8 (GLOVE) IMPLANT
GLOVE SUPERSENSE BIOGEL SZ 8 (GLOVE) ×1
GLOVE SURG ORTHO 8.0 STRL STRW (GLOVE) IMPLANT
GOWN STRL REUS W/ TWL LRG LVL3 (GOWN DISPOSABLE) IMPLANT
GOWN STRL REUS W/ TWL XL LVL3 (GOWN DISPOSABLE) ×1 IMPLANT
GOWN STRL REUS W/TWL 2XL LVL3 (GOWN DISPOSABLE) ×1 IMPLANT
GOWN STRL REUS W/TWL LRG LVL3 (GOWN DISPOSABLE) ×6
GOWN STRL REUS W/TWL XL LVL3 (GOWN DISPOSABLE) ×2
HANDPIECE INTERPULSE COAX TIP (DISPOSABLE)
HEAD FEM UNIPOLAR 45 OD STRL (Hips) ×1 IMPLANT
HOOD PEEL AWAY FLYTE STAYCOOL (MISCELLANEOUS) ×2 IMPLANT
IMMOBILIZER KNEE 20 (SOFTGOODS) ×2
IMMOBILIZER KNEE 20 THIGH 36 (SOFTGOODS) ×1 IMPLANT
KIT BASIN OR (CUSTOM PROCEDURE TRAY) ×2 IMPLANT
KIT TURNOVER KIT B (KITS) ×2 IMPLANT
MANIFOLD NEPTUNE II (INSTRUMENTS) ×2 IMPLANT
NDL SUT 6 .5 CRC .975X.05 MAYO (NEEDLE) IMPLANT
NEEDLE 22X1 1/2 (OR ONLY) (NEEDLE) ×2 IMPLANT
NEEDLE MAYO TAPER (NEEDLE)
NS IRRIG 1000ML POUR BTL (IV SOLUTION) ×2 IMPLANT
PACK TOTAL JOINT (CUSTOM PROCEDURE TRAY) ×2 IMPLANT
PACK UNIVERSAL I (CUSTOM PROCEDURE TRAY) ×2 IMPLANT
PAD ARMBOARD 7.5X6 YLW CONV (MISCELLANEOUS) ×4 IMPLANT
PILLOW ABDUCTION MEDIUM (MISCELLANEOUS) ×2 IMPLANT
PRESSURIZER FEMORAL UNIV (MISCELLANEOUS) IMPLANT
RETRIEVER SUT HEWSON (MISCELLANEOUS) ×2 IMPLANT
SET HNDPC FAN SPRY TIP SCT (DISPOSABLE) IMPLANT
SPACER DEPUY (Hips) ×1 IMPLANT
STEM SUMMIT BASIC PRESSFIT SZ5 (Hips) ×1 IMPLANT
SUT FIBERWIRE #2 38 REV NDL BL (SUTURE) ×6
SUT MNCRL AB 4-0 PS2 18 (SUTURE) ×2 IMPLANT
SUT STRATAFIX 1PDS 45CM VIOLET (SUTURE) ×2 IMPLANT
SUT VIC AB 0 CT1 27 (SUTURE) ×2
SUT VIC AB 0 CT1 27XBRD ANBCTR (SUTURE) ×1 IMPLANT
SUT VIC AB 1 CT1 27 (SUTURE) ×2
SUT VIC AB 1 CT1 27XBRD ANBCTR (SUTURE) ×2 IMPLANT
SUT VIC AB 3-0 SH 8-18 (SUTURE) ×2 IMPLANT
SUTURE FIBERWR#2 38 REV NDL BL (SUTURE) ×2 IMPLANT
SYR CONTROL 10ML LL (SYRINGE) ×2 IMPLANT
TOWEL GREEN STERILE (TOWEL DISPOSABLE) ×2 IMPLANT
TOWEL GREEN STERILE FF (TOWEL DISPOSABLE) ×2 IMPLANT
TOWER CARTRIDGE SMART MIX (DISPOSABLE) IMPLANT
TRAY FOLEY MTR SLVR 16FR STAT (SET/KITS/TRAYS/PACK) ×2 IMPLANT
WATER STERILE IRR 1000ML POUR (IV SOLUTION) ×2 IMPLANT

## 2019-11-19 NOTE — Progress Notes (Signed)
PROGRESS NOTE    Ruth Davidson   XBM:[id number]  DOB: February 10, 1941  DOA: 11/17/2019 PCP: Avon Gully, MD   Brief Narrative:  Ruth Davidson  is a 79 y.o. female,  Hypothyroid Dementia, CAD, CHF, Copd who apparently had unwitnessed fall and complains of right hip pain.  In ED found to have a right femoral neck fracture. She was transferred from [facility] to [facility] for surgery as her EF is < 20% and no cardiology coverage was available on the weekend.   Subjective: Quite confused. No complaints.     Assessment & Plan:   Principal Problem:   Femoral neck fracture - plans for OR today- high risk based on co morbidities, age, significant dementia and frail state  Active Problems:   CAD S/P remote PCI - cont ASA, Coreg, Lipitor    Dementia  - follow for sundowning, behavioral issues post op - cont Seroquel    Chronic combined systolic and diastolic CHF (congestive heart failure) - follow fluid status carefully - Lasix being held today as NPO     Time spent in minutes: 30 DVT prophylaxis: per ortho post op Code Status: DNR Family Communication:  Disposition Plan: PT eval after surgery Consultants:   ortho Procedures:   2 D ECHO 1. Left ventricular ejection fraction, by visual estimation, is <20%. The left ventricle has severely decreased function. There is no increased left ventricular wall thickness.  2. Left ventricular diastolic parameters are consistent with Grade I diastolic dysfunction (impaired relaxation).  3. The left ventricle demonstrates global hypokinesis.  4. Mild to moderate mitral annular calcification.  5. The mitral valve is grossly normal. Trivial mitral valve regurgitation.  6. No significant change from prior study. Antimicrobials:  Anti-infectives (From admission, onward)   Start     Dose/Rate Route Frequency Ordered Stop   11/19/19 0600  ceFAZolin (ANCEF) IVPB 2g/100 mL premix     2 g 200 mL/hr over 30 Minutes  Intravenous To Short Stay 11/19/19 0014 11/19/19 1249       Objective: Vitals:   11/19/19 1146 11/19/19 1147 11/19/19 1157 11/19/19 1207  BP: 117/65 (!) 94/59 111/66 102/64  Pulse: (!) 103 (!) 101  (!) 103  Resp: 17 15 (!) 21 18  Temp:      TempSrc:      SpO2: 97% 100%  100%  Weight:      Height:        Intake/Output Summary (Last 24 hours) at 11/19/2019 1310 Last data filed at 11/19/2019 0900 Gross per 24 hour  Intake 0 ml  Output --  Net 0 ml   Filed Weights   11/17/19 2149 11/18/19 0200 11/19/19 0500  Weight: 40.8 kg 40.8 kg 40.3 kg    Examination: General exam: Appears comfortable  HEENT: PERRLA, oral mucosa moist, no sclera icterus or thrush Respiratory system: Clear to auscultation. Respiratory effort normal. Cardiovascular system: S1 & S2 heard, RRR.   Gastrointestinal system: Abdomen soft, non-tender, nondistended. Normal bowel sounds. Central nervous system: Alert and oriented only to person. No focal neurological deficits. Extremities: No cyanosis, clubbing or edema Skin: No rashes or ulcers Psychiatry:  Mood & affect appropriate.     Data Reviewed: I have personally reviewed following labs and imaging studies  CBC: Recent Labs  Lab 11/17/19 2330 11/18/19 1029  WBC 10.4 9.9  NEUTROABS 8.8*  --   HGB 14.2 14.0  HCT 46.0 44.6  MCV 103.4* 102.3*  PLT 125* 118*   Basic  Metabolic Panel: Recent Labs  Lab 11/17/19 2330 11/18/19 1029  NA 140 140  K 3.6 4.2  CL 108 107  CO2 24 26  GLUCOSE 138* 102*  BUN 27* 21  CREATININE 1.25* 1.05*  CALCIUM 7.7* 8.1*   GFR: Estimated Creatinine Clearance: 28.5 mL/min (A) (by C-G formula based on SCr of 1.05 mg/dL (H)). Liver Function Tests: Recent Labs  Lab 11/18/19 1029  AST 46*  ALT 34  ALKPHOS 74  BILITOT 0.7  PROT 5.6*  ALBUMIN 3.1*   No results for input(s): LIPASE, AMYLASE in the last 168 hours. No results for input(s): AMMONIA in the last 168 hours. Coagulation Profile: Recent Labs  Lab  11/18/19 1029  INR 1.0   Cardiac Enzymes: No results for input(s): CKTOTAL, CKMB, CKMBINDEX, TROPONINI in the last 168 hours. BNP (last 3 results) No results for input(s): PROBNP in the last 8760 hours. HbA1C: No results for input(s): HGBA1C in the last 72 hours. CBG: No results for input(s): GLUCAP in the last 168 hours. Lipid Profile: No results for input(s): CHOL, HDL, LDLCALC, TRIG, CHOLHDL, LDLDIRECT in the last 72 hours. Thyroid Function Tests: Recent Labs    11/18/19 1036  TSH 0.390   Anemia Panel: No results for input(s): VITAMINB12, FOLATE, FERRITIN, TIBC, IRON, RETICCTPCT in the last 72 hours. Urine analysis:    Component Value Date/Time   COLORURINE YELLOW 05/17/2019 0020   APPEARANCEUR HAZY (A) 05/17/2019 0020   LABSPEC 1.013 05/17/2019 0020   PHURINE 5.0 05/17/2019 0020   GLUCOSEU NEGATIVE 05/17/2019 0020   HGBUR LARGE (A) 05/17/2019 0020   BILIRUBINUR NEGATIVE 05/17/2019 0020   KETONESUR NEGATIVE 05/17/2019 0020   PROTEINUR 30 (A) 05/17/2019 0020   NITRITE NEGATIVE 05/17/2019 0020   LEUKOCYTESUR TRACE (A) 05/17/2019 0020   Sepsis Labs: @LABRCNTIP (procalcitonin:4,lacticidven:4) ) Recent Results (from the past 240 hour(s))  SARS CORONAVIRUS 2 (TAT 6-24 HRS) Nasopharyngeal Nasopharyngeal Swab     Status: None   Collection Time: 11/18/19  3:55 AM   Specimen: Nasopharyngeal Swab  Result Value Ref Range Status   SARS Coronavirus 2 NEGATIVE NEGATIVE Final    Comment: (NOTE) SARS-CoV-2 target nucleic acids are NOT DETECTED. The SARS-CoV-2 RNA is generally detectable in upper and lower respiratory specimens during the acute phase of infection. Negative results do not preclude SARS-CoV-2 infection, do not rule out co-infections with other pathogens, and should not be used as the sole basis for treatment or other patient management decisions. Negative results must be combined with clinical observations, patient history, and epidemiological information. The  expected result is Negative. Fact Sheet for Patients: SugarRoll.be Fact Sheet for Healthcare Providers: https://www.woods-mathews.com/ This test is not yet approved or cleared by the [place] FDA and  has been authorized for detection and/or diagnosis of SARS-CoV-2 by FDA under an Emergency Use Authorization (EUA). This EUA will remain  in effect (meaning this test can be used) for the duration of the COVID-19 declaration under Section 56 4(b)(1) of the Act, 21 U.S.C. section 360bbb-3(b)(1), unless the authorization is terminated or revoked sooner. Performed at [facility], [address], [place], [place] [address]   Surgical pcr screen     Status: None   Collection Time: 11/19/19 12:35 AM   Specimen: Nasal Mucosa; Nasal Swab  Result Value Ref Range Status   MRSA, PCR NEGATIVE NEGATIVE Final   Staphylococcus aureus NEGATIVE NEGATIVE Final    Comment: (NOTE) The Xpert SA Assay (FDA approved for NASAL specimens in patients 31 years of age and  older), is one component of a comprehensive surveillance program. It is not intended to diagnose infection nor to guide or monitor treatment. Performed at [facility], [address], [place], [place] [address]          Radiology Studies: DG Chest 1 View  Result Date: 11/17/2019 CLINICAL DATA:  Recent right hip fracture EXAM: CHEST  1 VIEW COMPARISON:  05/06/2019 FINDINGS: Cardiac shadow is stable and accentuated by the frontal technique. The lungs are clear. No acute bony abnormality is seen. IMPRESSION: No acute abnormality noted. Electronically Signed   By: Alcide Clever M.D.   On: 11/17/2019 23:32   DG Hip Unilat  With Pelvis 2-3 Views Right  Result Date: 11/17/2019 CLINICAL DATA:  Unwitnessed fall. EXAM: DG HIP (WITH OR WITHOUT PELVIS) 2-3V RIGHT COMPARISON:  None. FINDINGS: There is a right femoral neck fracture with varus angulation. SI joints are symmetric. No subluxation  or dislocation. IMPRESSION: Right femoral neck fracture with varus angulation. Electronically Signed   By: Charlett Nose M.D.   On: 11/17/2019 23:27   ECHOCARDIOGRAM LIMITED  Result Date: 11/18/2019   ECHOCARDIOGRAM LIMITED REPORT   Patient Name:   EULOGIA DISMORE Date of Exam: 11/18/2019 Medical Rec #:  [id number]      Height:       63.0 in Accession #:    [id number]     Weight:       89.9 lb Date of Birth:  11-Sep-1941      BSA:          1.38 m Patient Age:    78 years       BP:           100/66 mmHg Patient Gender: F              HR:           111 bpm. [facility]am [facility]  Procedure: Limited Echo and Color Doppler Indications:    Abnormal EKG  History:        Patient has prior history of Echocardiogram examinations, most                 recent 06/22/2017. Right hip fracture.  Sonographer:    Lavenia Atlas RDCS Referring Phys: [phone] [facility]  Sonographer Comments: Image acquisition challenging due to uncooperative patient and pain in R hip. IMPRESSIONS  1. Left ventricular ejection fraction, by visual estimation, is <20%. The left ventricle has severely decreased function. There is no increased left ventricular wall thickness.  2. Left ventricular diastolic parameters are consistent with Grade I diastolic dysfunction (impaired relaxation).  3. The left ventricle demonstrates global hypokinesis.  4. Mild to moderate mitral annular calcification.  5. The mitral valve is grossly normal. Trivial mitral valve regurgitation.  6. No significant change from prior study. FINDINGS  Left Ventricle: Left ventricular ejection fraction, by visual estimation, is <20%. The left ventricle has severely decreased function. The left ventricle demonstrates global hypokinesis. There is no increased left ventricular wall thickness. Left ventricular diastolic parameters are consistent with Grade I diastolic dysfunction (impaired relaxation). Indeterminate filling pressures. Mitral Valve: The mitral valve is grossly  normal. Mild to moderate mitral annular calcification. Trivial mitral valve regurgitation.  LEFT VENTRICLE          Normals PLAX 2D LVIDd:         4.19 cm  3.6 cm   Diastology  Normals LVIDs:         3.86 cm  1.7 cm   LV e' lateral: 4.03 cm/s 6.42 cm/s LV PW:         1.04 cm  1.4 cm   LV e' medial:  2.39 cm/s 6.96 cm/s LV IVS:        0.89 cm  1.3 cm LVOT diam:     1.80 cm  2.0 cm LV SV:         14 ml    79 ml LV SV Index:   10.37    45 ml/m2 LVOT Area:     2.54 cm 3.14 cm2  LEFT ATRIUM         Index LA diam:    3.20 cm 2.32 cm/m   AORTA                 Normals Ao Root diam: 2.40 cm 31 mm  SHUNTS Systemic Diam: 1.80 cm  Zoila Shutter MD Electronically signed by Zoila Shutter MD Signature Date/Time: 11/18/2019/3:52:27 PMThe mitral valve is grossly normal.    Final       Scheduled Meds: . [MAR Hold] aspirin  81 mg Oral Daily  . [MAR Hold] atorvastatin  40 mg Oral q1800  . [MAR Hold] carvedilol  3.125 mg Oral BID WC  . [MAR Hold] enoxaparin (LOVENOX) injection  30 mg Subcutaneous Q24H  . [MAR Hold] feeding supplement (ENSURE ENLIVE)  237 mL Oral BID BM  . feeding supplement  296 mL Oral Once  . [MAR Hold] multivitamin with minerals  1 tablet Oral Daily  . [MAR Hold] potassium chloride  10 mEq Oral QODAY  . povidone-iodine  2 application Topical Once  . [MAR Hold] QUEtiapine  25 mg Oral BID   Continuous Infusions: . lactated ringers 10 mL/hr at 11/19/19 1220  . tranexamic acid       LOS: 1 day      Calvert Cantor, MD [facility] Pager: [email or web address] Password [email or web address] 11/19/2019, 1:10 PM

## 2019-11-19 NOTE — OR Nursing (Signed)
Patient earring, a single yellow hoop was removed by anesthesia and place in a small sealed bag and placed in patients chart.

## 2019-11-19 NOTE — Anesthesia Postprocedure Evaluation (Signed)
Anesthesia Post Note  Patient: Ruth Davidson  Procedure(s) Performed: RIGHT HIP HEMI-ARTHROPLASTY (Right Hip)     Patient location during evaluation: PACU Anesthesia Type: MAC and Spinal Level of consciousness: oriented and awake and alert Pain management: pain level controlled Vital Signs Assessment: post-procedure vital signs reviewed and stable Respiratory status: spontaneous breathing, respiratory function stable and patient connected to nasal cannula oxygen Cardiovascular status: blood pressure returned to baseline and stable Postop Assessment: no headache, no backache, no apparent nausea or vomiting, patient able to bend at knees and spinal receding Anesthetic complications: no    Last Vitals:  Vitals:   11/19/19 1545 11/19/19 1612  BP: 106/69 (!) 113/57  Pulse: 95 83  Resp: 19 15  Temp: 36.8 C 36.8 C  SpO2: 92% (!) 86%    Last Pain:  Vitals:   11/19/19 1612  TempSrc: Oral                 Isaiah Cianci,W. EDMOND

## 2019-11-19 NOTE — Plan of Care (Signed)
  Problem: Education: Goal: Knowledge of General Education information will improve Description: Including pain rating scale, medication(s)/side effects and non-pharmacologic comfort measures Outcome: Progressing   Problem: Activity: Goal: Risk for activity intolerance will decrease Outcome: Progressing   Problem: Nutrition: Goal: Adequate nutrition will be maintained Outcome: Progressing   Problem: Coping: Goal: Level of anxiety will decrease Outcome: Progressing   Problem: Pain Managment: Goal: General experience of comfort will improve Outcome: Progressing   Problem: Skin Integrity: Goal: Risk for impaired skin integrity will decrease Outcome: Progressing   Problem: Safety: Goal: Ability to remain free from injury will improve Outcome: Progressing   

## 2019-11-19 NOTE — Progress Notes (Signed)
Patient seen and examined, plan for right hip hemiarthroplasty.  High risk given multiple medical comorbidities.  The risks benefits and alternatives were discussed with the patient and family including but not limited to the risks of nonoperative treatment, versus surgical intervention including infection, bleeding, nerve injury, periprosthetic fracture, the need for revision surgery, dislocation, leg length discrepancy, blood clots, cardiopulmonary complications, morbidity, mortality, among others, and they were willing to proceed.     Eulas Post, MD

## 2019-11-19 NOTE — Anesthesia Procedure Notes (Signed)
Spinal  Patient location during procedure: OR Start time: 11/19/2019 12:35 PM End time: 11/19/2019 12:42 PM Staffing Performed: anesthesiologist  Anesthesiologist: Gaynelle Adu, MD Preanesthetic Checklist Completed: patient identified, IV checked, risks and benefits discussed, surgical consent, monitors and equipment checked, pre-op evaluation and timeout performed Spinal Block Patient position: right lateral decubitus Prep: DuraPrep Patient monitoring: cardiac monitor, continuous pulse ox and blood pressure Approach: midline Location: L3-4 Injection technique: single-shot Needle Needle type: Quincke  Needle gauge: 22 G Needle length: 9 cm Assessment Sensory level: T8 Additional Notes Functioning IV was confirmed and monitors were applied. Sterile prep and drape, including hand hygiene and sterile gloves were used. The patient was positioned and the spine was prepped. The skin was anesthetized with lidocaine.  Free flow of clear CSF was obtained prior to injecting local anesthetic into the CSF.  The spinal needle aspirated freely following injection.  The needle was carefully withdrawn.  The patient tolerated the procedure well.

## 2019-11-19 NOTE — Anesthesia Preprocedure Evaluation (Addendum)
Anesthesia Evaluation  Patient identified by MRN, date of birth, ID band Patient confused    Reviewed: Allergy & Precautions, H&P , NPO status , Patient's Chart, lab work & pertinent test results  Airway Mallampati: II  TM Distance: >3 FB Neck ROM: Full    Dental no notable dental hx. (+) Teeth Intact, Dental Advisory Given   Pulmonary neg pulmonary ROS, Current Smoker and Patient abstained from smoking.,    Pulmonary exam normal breath sounds clear to auscultation       Cardiovascular + CAD, + Past MI, + Cardiac Stents and +CHF   Rhythm:Irregular Rate:Normal     Neuro/Psych Dementia negative neurological ROS     GI/Hepatic negative GI ROS, Neg liver ROS,   Endo/Other  Hyperthyroidism   Renal/GU negative Renal ROS  negative genitourinary   Musculoskeletal   Abdominal   Peds  Hematology negative hematology ROS (+)   Anesthesia Other Findings   Reproductive/Obstetrics negative OB ROS                            Anesthesia Physical Anesthesia Plan  ASA: IV  Anesthesia Plan: MAC and Spinal   Post-op Pain Management:    Induction: Intravenous  PONV Risk Score and Plan: 2 and Propofol infusion and Ondansetron  Airway Management Planned: Simple Face Mask  Additional Equipment:   Intra-op Plan:   Post-operative Plan:   Informed Consent: I have reviewed the patients History and Physical, chart, labs and discussed the procedure including the risks, benefits and alternatives for the proposed anesthesia with the patient or authorized representative who has indicated his/her understanding and acceptance.   Patient has DNR.  Discussed DNR with power of attorney and Continue DNR.   Dental advisory given  Plan Discussed with: CRNA  Anesthesia Plan Comments:        Anesthesia Quick Evaluation

## 2019-11-19 NOTE — Op Note (Signed)
11/17/2019 - 11/19/2019  2:06 PM  PATIENT:  Ruth Davidson   MRN: [id number]  PRE-OPERATIVE DIAGNOSIS:  right hip fracture  POST-OPERATIVE DIAGNOSIS:  right hip fracture  PROCEDURE:  Procedure(s): RIGHT HIP HEMI-ARTHROPLASTY  PREOPERATIVE INDICATIONS:  Ruth Davidson is an 79 y.o. female who was admitted 11/17/2019 with a diagnosis of Fracture of femoral neck, right ([place]) and elected for surgical management.  The risks benefits and alternatives were discussed with the patient including but not limited to the risks of nonoperative treatment, versus surgical intervention including infection, bleeding, nerve injury, periprosthetic fracture, the need for revision surgery, dislocation, leg length discrepancy, blood clots, cardiopulmonary complications, morbidity, mortality, among others, and they were willing to proceed.  Predicted outcome is good, although there will be at least a six to nine month expected recovery.   OPERATIVE REPORT     SURGEON:  Marchia Bond, MD    ASSISTANT:  Merlene Pulling, PA-C,   (Present throughout the entire procedure,  necessary for completion of procedure in a timely manner, assisting with retraction, instrumentation, and closure)     ANESTHESIA: Spinal  ESTIMATED BLOOD LOSS: 629 mL    COMPLICATIONS:  None.   UNIQUE ASPECTS OF THE CASE: Her anatomy was very small.  The femoral component at a size 4 with sat completely flush, but was rotationally unstable.  I had a hard time getting the 5 to go completely down, and during my trialing process, with the 4 completely seated I felt that she was a little bit short.  Therefore ultimately I went with a 5, that filled her metaphysis optimally with excellent rotational stability, although it sat somewhat proud, and so therefore I went with a -1.5 neck, that provided adequate leg length restoration.  She may have still been just a little bit short but she had excellent capsular repair, and minimal shuck, and felt stable  throughout a very functional range of motion.  Bone quality was poor.     COMPONENTS:  Chemical engineer Femoral Fracture stem size 5, with a -1.5 spacer and a fracture head unipolar hip ball.    PROCEDURE IN DETAIL: The patient was met in the holding area and identified.  The appropriate hip  was marked at the operative site. The patient was then transported to the OR and  placed under anesthesia.  At that point, the patient was  placed in the lateral decubitus position with the operative side up and  secured to the operating room table and all bony prominences padded.     The operative lower extremity was prepped from the iliac crest to the toes.  Sterile draping was performed.  Time out was performed prior to incision.      A routine posterolateral approach was utilized via sharp dissection  carried down to the subcutaneous tissue.  Gross bleeders were Bovie  coagulated.  The iliotibial band was identified and incised  along the length of the skin incision.  Self-retaining retractors were  inserted.  With the hip internally rotated, the short external rotators  were identified. The piriformis was tagged with FiberWire, and the hip capsule released in a T-type fashion.  The femoral neck was exposed, and I resected the femoral neck using the appropriate jig. This was performed at approximately a thumb's breadth above the lesser trochanter.    I then exposed the deep acetabulum, cleared out any tissue including the ligamentum teres, and included the hip capsule in the FiberWire used above and below the  T.    I then prepared the proximal femur using the cookie-cutter, the lateralizing reamer, and then sequentially broached.  I initially trialed with a size 4 which felt a little bit short, was completely seated, but was still rotationally unstable.  After this trial, I work with the 5 broach, although it would not fully seat and I did not want to risk fracture, so I ultimately did a size 5,  getting optimal rotational stability, as well as gaining some length, and then used a -1.5 neck length.  I then place the real implant and I impacted the real head ball into place. The hip was then reduced and taken through functional range of motion and found to have excellent stability.  Soft tissue tension was appropriate, she still felt a little bit short, but had excellent stability so I continued with the current construct.  I then used a 2 mm drill bits to pass the FiberWire suture from the capsule and piriformis through the greater trochanter, and secured this. Excellent posterior capsular repair was achieved. I also closed the T in the capsule.  I then irrigated the hip copiously again with pulse lavage, and repaired the fascia with Vicryl, followed by Vicryl for the subcutaneous tissue, Monocryl for the skin, Steri-Strips and sterile gauze. The wounds were injected. The patient was then awakened and returned to PACU in stable and satisfactory condition. There were no complications.  Marchia Bond, MD Orthopedic Surgeon [phone]   11/19/2019 2:06 PM

## 2019-11-19 NOTE — Progress Notes (Signed)
PHARMACY NOTE:  ANTIMICROBIAL RENAL DOSAGE ADJUSTMENT  Current antimicrobial regimen includes a mismatch between antimicrobial dosage and estimated renal function.  As per policy approved by the Pharmacy & Therapeutics and Medical Executive Committees, the antimicrobial dosage will be adjusted accordingly.  Current antimicrobial dosage:  Cefazolin 2gm IV q6h x 3 doses  Indication:  Surgical prophylaxis  Renal Function:  Estimated Creatinine Clearance: 28.5 mL/min (A) (by C-G formula based on SCr of 1.05 mg/dL (H)). []      On intermittent HD, scheduled: []      On CRRT    Antimicrobial dosage has been changed to:    Cefazolin 2 gm IV q12h x 1 dose  Additional comments:   Thank you for allowing pharmacy to be a part of this patient's care.  , [facility] 11/19/2019 4:56 PM

## 2019-11-19 NOTE — Transfer of Care (Signed)
Immediate Anesthesia Transfer of Care Note  Patient: Ruth Davidson  Procedure(s) Performed: RIGHT HIP HEMI-ARTHROPLASTY (Right Hip)  Patient Location: PACU  Anesthesia Type:MAC  Level of Consciousness: drowsy, confused and responds to stimulation  Airway & Oxygen Therapy: Patient Spontanous Breathing and Patient connected to face mask oxygen  Post-op Assessment: Report given to RN, Vital signs stable.  Post vital signs: Reviewed and stable  Last Vitals:  Vitals Value Taken Time  BP 105/65 11/19/19 1447  Temp 36.7 C 11/19/19 1447  Pulse 77 11/19/19 1447  Resp 17 11/19/19 1452  SpO2    Vitals shown include unvalidated device data.  Last Pain:  Vitals:   11/19/19 0809  TempSrc: Oral      Patients Stated Pain Goal: Other (Comment)(minimal to no pain on faces) (48/18/59 0931)  Complications: No apparent anesthesia complications

## 2019-11-19 NOTE — Progress Notes (Signed)
Orthopedic Tech Progress Note Patient Details:  Ruth Davidson [birth year] [id number] KNEE IMMOBILIZER was applied in the OR  Patient ID: Ruth Davidson, female   DOB: 24-Apr-1941, 79 y.o.   MRN: [id number]   Ruth Davidson 11/19/2019, 4:37 PM

## 2019-11-19 NOTE — Plan of Care (Signed)
  Problem: Clinical Measurements: Goal: Respiratory complications will improve Outcome: Progressing   Problem: Activity: Goal: Risk for activity intolerance will decrease Outcome: Progressing   Problem: Nutrition: Goal: Adequate nutrition will be maintained Outcome: Progressing   Problem: Coping: Goal: Level of anxiety will decrease Outcome: Progressing   Problem: Clinical Measurements: Goal: Respiratory complications will improve Outcome: Progressing   Problem: Activity: Goal: Risk for activity intolerance will decrease Outcome: Progressing   Problem: Nutrition: Goal: Adequate nutrition will be maintained Outcome: Progressing   Problem: Coping: Goal: Level of anxiety will decrease Outcome: Progressing

## 2019-11-20 ENCOUNTER — Encounter: Payer: Self-pay | Admitting: *Deleted

## 2019-11-20 LAB — BASIC METABOLIC PANEL
Anion gap: 8 (ref 5–15)
BUN: 21 mg/dL (ref 8–23)
CO2: 27 mmol/L (ref 22–32)
Calcium: 8.4 mg/dL — ABNORMAL LOW (ref 8.9–10.3)
Chloride: 107 mmol/L (ref 98–111)
Creatinine, Ser: 0.96 mg/dL (ref 0.44–1.00)
GFR calc Af Amer: >60 mL/min (ref >60)
GFR calc non Af Amer: 57 mL/min — ABNORMAL LOW (ref >60)
Glucose, Bld: 96 mg/dL (ref 70–99)
Potassium: 4.6 mmol/L (ref 3.5–5.1)
Sodium: 142 mmol/L (ref 135–145)

## 2019-11-20 LAB — CBC
HCT: 37.8 % (ref 36.0–46.0)
Hemoglobin: 11.9 g/dL — ABNORMAL LOW (ref 12.0–15.0)
MCH: 31.5 pg (ref 26.0–34.0)
MCHC: 31.5 g/dL (ref 30.0–36.0)
MCV: 100 fL (ref 80.0–100.0)
Platelets: 99 10*3/uL — ABNORMAL LOW (ref 150–400)
RBC: 3.78 MIL/uL — ABNORMAL LOW (ref 3.87–5.11)
RDW: 13 % (ref 11.5–15.5)
WBC: 7.2 10*3/uL (ref 4.0–10.5)
nRBC: 0 % (ref 0.0–0.2)

## 2019-11-20 NOTE — Evaluation (Signed)
Physical Therapy Evaluation Patient Details Name: Ruth Davidson MRN: [id number] DOB: 17-Sep-1941 Today's Date: 11/20/2019   History of Present Illness  Ruth Davidson is a 79 y.o. F s/p R hip hemi-arthroplasty secondary to a fracture of the femoral neck following a fall. She has a PMH of dementia, CAD, CHF, COPD  Clinical Impression  Pt admitted for above. She presents with deficits in strength, ROM, balance, gait, cognition, pain, and overall functional mobility. Her participation in therapy is very limited by her cognition, as she has difficulty following commands. She required mod assist +2 for bed mobility and min/max assist +2 for transfers as described below. Described posterior hip precautions to her, but she did not show evidence of learning. SPT and OT ensured she followed precautions during all mobility. Recommend she be discharged to SNF due to current mobility status to increase independence and functional mobility prior to discharge to memory care. She would benefit from continued skilled acute PT intervention to address deficits.     Follow Up Recommendations SNF;Supervision/Assistance - 24 hour    Equipment Recommendations  Other (comment)(TBD next venue)    Recommendations for Other Services       Precautions / Restrictions Precautions Precautions: Posterior Hip;Fall Precaution Booklet Issued: Yes (comment) Restrictions Weight Bearing Restrictions: Yes RLE Weight Bearing: Weight bearing as tolerated      Mobility  Bed Mobility Overal bed mobility: Needs Assistance Bed Mobility: Supine to Sit     Supine to sit: Mod assist;+2 for physical assistance;HOB elevated     General bed mobility comments: mod +2 for  B LE management and trunk righting; pt helped scoot herself  Transfers Overall transfer level: Needs assistance Equipment used: 2 person hand held assist Transfers: Stand Pivot Transfers;Sit to/from Stand Sit to Stand: Min assist;+2 physical  assistance Stand pivot transfers: Max assist;+2 physical assistance       General transfer comment: pt able to power up to standing with min assist +2; required max assist +2 to reach chair with pivot transfer for both safety and physically moving her R leg as she would not advance it on her own  Ambulation/Gait                Stairs            Wheelchair Mobility    Modified Rankin (Stroke Patients Only)       Balance Overall balance assessment: Needs assistance;History of Falls Sitting-balance support: No upper extremity supported;Feet unsupported Sitting balance-Leahy Scale: Fair Sitting balance - Comments: EOB for donning of gait belt; supervision   Standing balance support: Bilateral upper extremity supported;During functional activity Standing balance-Leahy Scale: Poor Standing balance comment: reliant on B UE external support                             Pertinent Vitals/Pain Pain Assessment: Faces Faces Pain Scale: Hurts whole lot Pain Location: R hip, back Pain Descriptors / Indicators: Aching;Grimacing;Guarding;Moaning Pain Intervention(s): Limited activity within patient's tolerance;Monitored during session;Repositioned    Home Living Family/patient expects to be discharged to:: Other (Comment)                 Additional Comments: memory care facility; due to cognition, unable to aquire details    Prior Function Level of Independence: (unknown due to cognition)               Hand Dominance   Dominant Hand: Right    Extremity/Trunk Assessment  Upper Extremity Assessment Upper Extremity Assessment: Defer to OT evaluation    Lower Extremity Assessment Lower Extremity Assessment: Generalized weakness;RLE deficits/detail RLE Deficits / Details: post-op pain and weakness    Cervical / Trunk Assessment Cervical / Trunk Assessment: Normal  Communication   Communication: No difficulties  Cognition Arousal/Alertness:  Lethargic Behavior During Therapy: Flat affect Overall Cognitive Status: History of cognitive impairments - at baseline                                 General Comments: pt with hx of demetia; difficulty following commands, answering questions; when asked if she lives alone, she says no and says she lives with her dad      General Comments      Exercises     Assessment/Plan    PT Assessment Patient needs continued PT services  PT Problem List Decreased strength;Decreased range of motion;Decreased balance;Decreased activity tolerance;Decreased mobility;Decreased coordination;Decreased cognition;Decreased knowledge of use of DME;Decreased safety awareness;Decreased knowledge of precautions;Pain       PT Treatment Interventions DME instruction;Gait training;Stair training;Functional mobility training;Therapeutic activities;Therapeutic exercise;Balance training;Neuromuscular re-education;Cognitive remediation;Patient/family education;Wheelchair mobility training;Modalities    PT Goals (Current goals can be found in the Care Plan section)  Acute Rehab PT Goals Patient Stated Goal: unable PT Goal Formulation: Patient unable to participate in goal setting Time For Goal Achievement: 12/04/19 Potential to Achieve Goals: Fair    Frequency Min 3X/week   Barriers to discharge   d/c to memory care; unsure how much assistance they give pt with mobility    Co-evaluation PT/OT/SLP Co-Evaluation/Treatment: Yes Reason for Co-Treatment: Complexity of the patient's impairments (multi-system involvement);Necessary to address cognition/behavior during functional activity;For patient/therapist safety;To address functional/ADL transfers PT goals addressed during session: Mobility/safety with mobility;Balance         AM-PAC PT "6 Clicks" Mobility  Outcome Measure Help needed turning from your back to your side while in a flat bed without using bedrails?: A Lot Help needed moving  from lying on your back to sitting on the side of a flat bed without using bedrails?: A Lot Help needed moving to and from a bed to a chair (including a wheelchair)?: A Lot Help needed standing up from a chair using your arms (e.g., wheelchair or bedside chair)?: A Little Help needed to walk in hospital room?: Total Help needed climbing 3-5 steps with a railing? : Total 6 Click Score: 11    End of Session Equipment Utilized During Treatment: Gait belt Activity Tolerance: Patient limited by lethargy;Patient limited by pain Patient left: in chair;with call bell/phone within reach;with chair alarm set;with restraints reapplied Nurse Communication: Mobility status PT Visit Diagnosis: Unsteadiness on feet (R26.81);Other abnormalities of gait and mobility (R26.89);History of falling (Z91.81);Muscle weakness (generalized) (M62.81);Pain;Other symptoms and signs involving the nervous system (R29.898) Pain - Right/Left: Right Pain - part of body: Hip    Time: 2423-5361 PT Time Calculation (min) (ACUTE ONLY): 18 min   Charges:   PT Evaluation $PT Eval Moderate Complexity: 1 Mod          Wyvonna Plum, SPT   Verdene Lennert 11/20/2019, 12:55 PM

## 2019-11-20 NOTE — Evaluation (Signed)
Occupational Therapy Evaluation Patient Details Name: Ruth Davidson MRN: [id number] DOB: 03/05/1941 Today's Date: 11/20/2019    History of Present Illness Ruth Davidson is a 79 y.o. F s/p R hip hemi-arthroplasty secondary to a fracture of the femoral neck following a fall. She has a PMH of dementia, CAD, CHF, COPD   Clinical Impression   Pt in bed upon arrival; eval limited by her cognition, as she has difficulty following commands. Pt requires mod assist +2 for bed mobility, mod A with UB ADLs, total A with LB ADLs, total A for toileting and min/max assist +2  for SPTs. Pt resides at a facility on a memory care unit and uncertain of PLOF at this time; pt unable to provide PLOF due to cognitive deficits. Pt stated that she lives at home with her dad. Pt would benefit from acute OT services to address impairments to maximize level of function and safety    Follow Up Recommendations  SNF;Supervision/Assistance - 24 hour    Equipment Recommendations  None recommended by OT    Recommendations for Other Services       Precautions / Restrictions Precautions Precautions: Posterior Hip;Fall Precaution Booklet Issued: Yes (comment) Precaution Comments: posterior precautions posted in pt's room Restrictions Weight Bearing Restrictions: Yes RLE Weight Bearing: Weight bearing as tolerated      Mobility Bed Mobility Overal bed mobility: Needs Assistance Bed Mobility: Supine to Sit     Supine to sit: Mod assist;+2 for physical assistance;HOB elevated     General bed mobility comments: mod +2 for  B LE management and trunk righting; pt helped scoot herself  Transfers Overall transfer level: Needs assistance Equipment used: 2 person hand held assist Transfers: Stand Pivot Transfers;Sit to/from Stand Sit to Stand: Min assist;+2 physical assistance Stand pivot transfers: Max assist;+2 physical assistance       General transfer comment: pt able to power up to standing with min assist  +2; required max assist +2 to reach chair with pivot transfer for both safety and physically moving her R leg as she would not advance it on her own    Balance Overall balance assessment: Needs assistance;History of Falls Sitting-balance support: No upper extremity supported;Feet unsupported Sitting balance-Leahy Scale: Fair Sitting balance - Comments: EOB for donning of gait belt; supervision   Standing balance support: Bilateral upper extremity supported;During functional activity Standing balance-Leahy Scale: Poor Standing balance comment: reliant on B UE external support                           ADL either performed or assessed with clinical judgement   ADL Overall ADL's : Needs assistance/impaired     Grooming: Wash/dry hands;Wash/dry face;Sitting;Min guard   Upper Body Bathing: Moderate assistance   Lower Body Bathing: Total assistance   Upper Body Dressing : Moderate assistance   Lower Body Dressing: Total assistance   Toilet Transfer: Maximal assistance;+2 for physical assistance;Stand-pivot Toilet Transfer Details (indicate cue type and reason): simulated bed to recliner Toileting- Clothing Manipulation and Hygiene: Total assistance         General ADL Comments: pt lives in a memory care facility and was not able to provide ADL/selfcare PLOF info due to cognition     Vision Baseline Vision/History: (uncertain at this time) Patient Visual Report: (unable to properly assess due to cognition) Additional Comments: unable to properly assess due to cognition     Perception     Praxis  Pertinent Vitals/Pain Pain Assessment: Faces Faces Pain Scale: Hurts whole lot Pain Location: R hip, back Pain Descriptors / Indicators: Aching;Grimacing;Guarding;Moaning Pain Intervention(s): Limited activity within patient's tolerance;Monitored during session;Repositioned     Hand Dominance Right   Extremity/Trunk Assessment Upper Extremity Assessment Upper  Extremity Assessment: Generalized weakness   Lower Extremity Assessment Lower Extremity Assessment: Defer to PT evaluation RLE Deficits / Details: post-op pain and weakness   Cervical / Trunk Assessment Cervical / Trunk Assessment: Normal   Communication Communication Communication: No difficulties   Cognition Arousal/Alertness: Lethargic Behavior During Therapy: Flat affect Overall Cognitive Status: History of cognitive impairments - at baseline                                 General Comments: pt with hx of demetia; difficulty following commands, answering questions; when asked if she lives alone, she says no and says she lives with her dad   General Comments       Exercises     Shoulder Instructions      Home Living Family/patient expects to be discharged to:: Other (Comment)                                 Additional Comments: memory care facility; due to cognition, unable to aquire details      Prior Functioning/Environment Level of Independence: (unknown due to cognition)        Comments: unable to obtain PLOF info form pt due to cognition        OT Problem List: Decreased strength;Impaired balance (sitting and/or standing);Decreased cognition;Decreased knowledge of precautions;Pain;Decreased safety awareness;Decreased activity tolerance;Decreased knowledge of use of DME or AE      OT Treatment/Interventions: Self-care/ADL training;DME and/or AE instruction;Therapeutic activities;Therapeutic exercise;Patient/family education    OT Goals(Current goals can be found in the care plan section) Acute Rehab OT Goals Patient Stated Goal: unable OT Goal Formulation: Patient unable to participate in goal setting Time For Goal Achievement: 12/04/19 Potential to Achieve Goals: Fair ADL Goals Pt Will Perform Grooming: with min guard assist;with supervision;with set-up;sitting Pt Will Perform Upper Body Bathing: with min assist;sitting Pt  Will Perform Upper Body Dressing: with min assist;sitting Pt Will Transfer to Toilet: with mod assist;stand pivot transfer;bedside commode  OT Frequency: Min 2X/week   Barriers to D/C: Decreased caregiver support          Co-evaluation PT/OT/SLP Co-Evaluation/Treatment: Yes Reason for Co-Treatment: Complexity of the patient's impairments (multi-system involvement);For patient/therapist safety;To address functional/ADL transfers;Necessary to address cognition/behavior during functional activity PT goals addressed during session: Mobility/safety with mobility;Balance OT goals addressed during session: ADL's and self-care      AM-PAC OT "6 Clicks" Daily Activity     Outcome Measure Help from another person eating meals?: A Little Help from another person taking care of personal grooming?: A Little Help from another person toileting, which includes using toliet, bedpan, or urinal?: Total Help from another person bathing (including washing, rinsing, drying)?: Total Help from another person to put on and taking off regular upper body clothing?: A Lot Help from another person to put on and taking off regular lower body clothing?: Total 6 Click Score: 11   End of Session Equipment Utilized During Treatment: Gait belt Nurse Communication: Mobility status  Activity Tolerance: Patient limited by pain;Patient limited by fatigue Patient left: in chair;with call bell/phone within reach;with chair alarm set  OT Visit Diagnosis: Unsteadiness on feet (R26.81);Other abnormalities of gait and mobility (R26.89);Muscle weakness (generalized) (M62.81);History of falling (Z91.81);Other symptoms and signs involving cognitive function;Pain Pain - Right/Left: Right Pain - part of body: Hip;Leg                Time: 4888-9169 OT Time Calculation (min): 19 min Charges:  OT General Charges $OT Visit: 1 Visit OT Evaluation $OT Eval Moderate Complexity: 1 Mod    Galen Manila 11/20/2019, 2:06  PM

## 2019-11-20 NOTE — Progress Notes (Signed)
    Subjective: Patient minimally interactive due to dementia.  She does deny pain.  Sleeping comfortably.  Objective:   VITALS:   Vitals:   11/19/19 1935 11/20/19 0343 11/20/19 0540 11/20/19 0551  BP:  115/78    Pulse:  77    Resp:  17  16  Temp:  98.3 F (36.8 C)    TempSrc:      SpO2: 100% 92%    Weight:   46 kg   Height:       CBC Latest Ref Rng & Units 11/20/2019 11/18/2019 11/17/2019  WBC 4.0 - 10.5 K/uL 7.2 9.9 10.4  Hemoglobin 12.0 - 15.0 g/dL 11.9(L) 14.0 14.2  Hematocrit 36.0 - 46.0 % 37.8 44.6 46.0  Platelets 150 - 400 K/uL 99(L) 118(L) 125(L)   BMP Latest Ref Rng & Units 11/20/2019 11/18/2019 11/17/2019  Glucose 70 - 99 mg/dL 96 878(M) 767(M)  BUN 8 - 23 mg/dL 21 21 09(O)  Creatinine 0.44 - 1.00 mg/dL 7.09 6.28(Z) 6.62(H)  BUN/Creat Ratio 6 - 22 (calc) - - -  Sodium 135 - 145 mmol/L 142 140 140  Potassium 3.5 - 5.1 mmol/L 4.6 4.2 3.6  Chloride 98 - 111 mmol/L 107 107 108  CO2 22 - 32 mmol/L 27 26 24   Calcium 8.9 - 10.3 mg/dL ) 8.1(L) 7.7(L)   Intake/Output      01/17 0701 - 01/18 0700 01/18 0701 - 01/19 0700   P.O. 60    I.V. (mL/kg) 0 (0)    IV Piggyback 350    Total Intake(mL/kg) 410 (8.9)    Urine (mL/kg/hr) 1750 (1.6)    Blood 100    Total Output 1850    Net -1440             Physical Exam: General: NAD.  Supine in bed.  Calm.  Responds to commands.  No increased work of breathing. MSK RLE: Sensation intact distally Feet warm Dorsiflexion/Plantar flexion intact Incision: dressing C/D/I   Assessment: 1 Day Post-Op  S/P Procedure(s) (LRB): RIGHT HIP HEMI-ARTHROPLASTY (Right) by Dr. 2/19 on 11/19/2019  Principal Problem:   Fracture of femoral neck, right (HCC) Active Problems:   CAD S/P remote PCI   Dementia (HCC)   Chronic combined systolic and diastolic CHF (congestive heart failure) (HCC)   Chronic systolic CHF (congestive heart failure) (HCC)   Closed right femoral neck fracture, status post Hemiarthroplasty Doing well  postop day 1 Pain controlled Not yet mobilized  Plan: Up with therapy Incentive Spirometry Apply ice PRN  Weightbearing: WBAT RLE. Posterior Hip Precautions. Insicional and dressing care: Dressings left intact until follow-up Showering: Keep dressing dry VTE prophylaxis: Lovenox, SCDs, ambulation Pain control: Minimize narcotics.  Continue current regimen. Follow - up plan: 2 weeks Contact information: 11/21/2019, MD, Teryl Lucy, PA-C  Dispo: TBD.  Therapy evaluations pending.  Previously at [facility] care.  Discharge when mobilized and ready medically.  [facility] Martensen III, PA-C 11/20/2019, 7:22 AM

## 2019-11-20 NOTE — Progress Notes (Signed)
Initial Nutrition Assessment  RD working remotely.  DOCUMENTATION CODES:   Underweight  INTERVENTION:   -Continue MVI with minerals daily -Continue Ensure Enlive po BID, each supplement provides 350 kcal and 20 grams of protein -Magic cup TID with meals, each supplement provides 290 kcal and 9 grams of protein -Feeding assistance with meals  NUTRITION DIAGNOSIS:   Increased nutrient needs related to post-op healing as evidenced by estimated needs.  GOAL:   Patient will meet greater than or equal to 90% of their needs  MONITOR:   PO intake, Supplement acceptance, Labs, Weight trends, Skin, I & O's  REASON FOR ASSESSMENT:   Other (Comment)    ASSESSMENT:   Ruth Davidson  is a 79 y.o. female,  Hypothyroid Dementia, CAD, CHF (diastolic), Copd apparently had unwitnessed fall and complains of right hip pain. Pt is not willing to cooperate with questioning  Pt admitted with rt femoral neck fracture.   1/17- s/p rt hip hemi arthroplasty  Reviewed I/O's: -1.4 L x 24 hours and -440 ml since admission  UOP: 1.8 L x 24 hours  RD attempted to speak with pt via phone, however, no answer.    No meal completion records available to assess at this time. Pt with dementia and suspect poor oral intake due to unfamiliar surroundings. Per chart review, pt lethargic today.   Reviewed wt hx; wt has been stable. Given pt's underweight status and dementia, highly suspect malnutrition, however, unable to identify at this time.   Medications reviewed and include KCl and colace.   Per MD notes, plan to discharge back to SN[facility] once medically optimized.   Labs reviewed.   Diet Order:   Diet Order            Diet regular Room service appropriate? Yes; Fluid consistency: Thin  Diet effective now              EDUCATION NEEDS:   No education needs have been identified at this time  Skin:  Skin Assessment: Skin Integrity Issues: Skin Integrity Issues::  Incisions Incisions: closed rt hip  Last BM:  11/18/19  Height:   Ht Readings from Last 1 Encounters:  11/18/19 5\' 3"  (1.6 m)    Weight:   Wt Readings from Last 1 Encounters:  11/20/19 46 kg    Ideal Body Weight:  52.3 kg  BMI:  Body mass index is 17.96 kg/m.  Estimated Nutritional Needs:   Kcal:  1650-1850  Protein:  80-95 grams  Fluid:  > 1.6 L    Dachelle Molzahn A. 11/22/19, RD, LDN, CDCES Registered Dietitian II Certified Diabetes Care and Education Specialist Pager: [phone] After hours Pager: [phone]

## 2019-11-20 NOTE — Progress Notes (Addendum)
PROGRESS NOTE    Ruth Davidson   FAO:[id number]  DOB: 1941/03/28  DOA: 11/17/2019 PCP: Avon Gully, MD   Brief Narrative:  Ruth Davidson  is a 79 y.o. female,  Hypothyroid Dementia, CAD, CHF, Copd who apparently had unwitnessed fall and complains of right hip pain.  In ED found to have a right femoral neck fracture. She was transferred from [facility] to [facility] for surgery as her EF is < 20% and no cardiology coverage was available on the weekend.   Subjective: Seen and examined.  She is lethargic and complains of bilateral hip pain.  She is disoriented.  Nurse present at the bedside and per RN, this is her baseline.    Assessment & Plan:   Principal Problem:   Femoral neck fracture -S/p right hemiarthroplasty on 11/18/2018.  Postop day 1.  Complains of pain.  Seen by orthopedic.  Management per them.   Active Problems:   CAD S/P remote PCI - cont ASA, Coreg, Lipitor    Dementia  - follow for sundowning, behavioral issues post op - cont Seroquel.  Currently no signs of sundowning.    Chronic combined systolic and diastolic CHF (congestive heart failure) - follow fluid status carefully - Lasix was being held due to being NPO.  Although she has been started on heart healthy diet but she looks dehydrated clinically so I will continue to hold Lasix and reassess tomorrow.  Acute blood loss anemia/postoperative anemia: Hemoglobin dropped from 14.2 at the time of admission to 11.9.  No indication of transfusion.  Repeat CBC tomorrow morning.  Incision site at right hip looks well without any bleeding or any signs of infection.  Time spent in minutes: 32 minutes DVT prophylaxis: per ortho post op, she currently is Lovenox 30 twice daily Code Status: DNR Family Communication: None Disposition Plan: PT eval after surgery.  Will need SNF. Consultants:   ortho Procedures:   2 D ECHO 1. Left ventricular ejection fraction, by visual estimation, is <20%.  The left ventricle has severely decreased function. There is no increased left ventricular wall thickness.  2. Left ventricular diastolic parameters are consistent with Grade I diastolic dysfunction (impaired relaxation).  3. The left ventricle demonstrates global hypokinesis.  4. Mild to moderate mitral annular calcification.  5. The mitral valve is grossly normal. Trivial mitral valve regurgitation.  6. No significant change from prior study. Antimicrobials:  Anti-infectives (From admission, onward)   Start     Dose/Rate Route Frequency Ordered Stop   11/20/19 0000  ceFAZolin (ANCEF) IVPB 2g/100 mL premix     2 g 200 mL/hr over 30 Minutes Intravenous Every 12 hours 11/19/19 1619 11/19/19 2359   11/19/19 0600  ceFAZolin (ANCEF) IVPB 2g/100 mL premix     2 g 200 mL/hr over 30 Minutes Intravenous To Short Stay 11/19/19 0014 11/19/19 1249       Objective: Vitals:   11/20/19 0343 11/20/19 0540 11/20/19 0551 11/20/19 0753  BP: 115/78   (!) 106/52  Pulse: 77   79  Resp: 17  16 16   Temp: 98.3 F (36.8 C)   98.4 F (36.9 C)  TempSrc:    Oral  SpO2: 92%   100%  Weight:  46 kg    Height:        Intake/Output Summary (Last 24 hours) at 11/20/2019 1106 Last data filed at 11/20/2019 0900 Gross per 24 hour  Intake 470 ml  Output 2250 ml  Net -1780 ml  Filed Weights   11/18/19 0200 11/19/19 0500 11/20/19 0540  Weight: 40.8 kg 40.3 kg 46 kg    Examination: General exam: Appears calm but in mild pain Respiratory system: Clear to auscultation. Respiratory effort normal. Cardiovascular system: S1 & S2 heard, RRR. No JVD, murmurs, rubs, gallops or clicks. No pedal edema. Gastrointestinal system: Abdomen is nondistended, soft and nontender. No organomegaly or masses felt. Normal bowel sounds heard. Central nervous system: Slightly lethargic and partially oriented. Skin: No rashes, lesions or ulcers.   Data Reviewed: I have personally reviewed following labs and imaging  studies  CBC: Recent Labs  Lab 11/17/19 2330 11/18/19 1029 11/20/19 0449  WBC 10.4 9.9 7.2  NEUTROABS 8.8*  --   --   HGB 14.2 14.0 11.9*  HCT 46.0 44.6 37.8  MCV 103.4* 102.3* 100.0  PLT 125* 118* 99*   Basic Metabolic Panel: Recent Labs  Lab 11/17/19 2330 11/18/19 1029 11/20/19 0449  NA 140 140 142  K 3.6 4.2 4.6  CL 108 107 107  CO2 24 26 27   GLUCOSE 138* 102* 96  BUN 27* 21 21  CREATININE 1.25* 1.05* 0.96  CALCIUM 7.7* 8.1* 8.4*   GFR: Estimated Creatinine Clearance: 35.6 mL/min (by C-G formula based on SCr of 0.96 mg/dL). Liver Function Tests: Recent Labs  Lab 11/18/19 1029  AST 46*  ALT 34  ALKPHOS 74  BILITOT 0.7  PROT 5.6*  ALBUMIN 3.1*   No results for input(s): LIPASE, AMYLASE in the last 168 hours. No results for input(s): AMMONIA in the last 168 hours. Coagulation Profile: Recent Labs  Lab 11/18/19 1029  INR 1.0   Cardiac Enzymes: No results for input(s): CKTOTAL, CKMB, CKMBINDEX, TROPONINI in the last 168 hours. BNP (last 3 results) No results for input(s): PROBNP in the last 8760 hours. HbA1C: No results for input(s): HGBA1C in the last 72 hours. CBG: No results for input(s): GLUCAP in the last 168 hours. Lipid Profile: No results for input(s): CHOL, HDL, LDLCALC, TRIG, CHOLHDL, LDLDIRECT in the last 72 hours. Thyroid Function Tests: Recent Labs    11/18/19 1036  TSH 0.390   Anemia Panel: No results for input(s): VITAMINB12, FOLATE, FERRITIN, TIBC, IRON, RETICCTPCT in the last 72 hours. Urine analysis:    Component Value Date/Time   COLORURINE YELLOW 11/19/2019 1718   APPEARANCEUR CLEAR 11/19/2019 1718   LABSPEC 1.030 11/19/2019 1718   PHURINE 5.0 11/19/2019 1718   GLUCOSEU NEGATIVE 11/19/2019 1718   HGBUR MODERATE (A) 11/19/2019 1718   BILIRUBINUR NEGATIVE 11/19/2019 1718   KETONESUR 20 (A) 11/19/2019 1718   PROTEINUR NEGATIVE 11/19/2019 1718   NITRITE NEGATIVE 11/19/2019 1718   LEUKOCYTESUR SMALL (A) 11/19/2019 1718    Sepsis Labs: @LABRCNTIP (procalcitonin:4,lacticidven:4) ) Recent Results (from the past 240 hour(s))  SARS CORONAVIRUS 2 (TAT 6-24 HRS) Nasopharyngeal Nasopharyngeal Swab     Status: None   Collection Time: 11/18/19  3:55 AM   Specimen: Nasopharyngeal Swab  Result Value Ref Range Status   SARS Coronavirus 2 NEGATIVE NEGATIVE Final    Comment: (NOTE) SARS-CoV-2 target nucleic acids are NOT DETECTED. The SARS-CoV-2 RNA is generally detectable in upper and lower respiratory specimens during the acute phase of infection. Negative results do not preclude SARS-CoV-2 infection, do not rule out co-infections with other pathogens, and should not be used as the sole basis for treatment or other patient management decisions. Negative results must be combined with clinical observations, patient history, and epidemiological information. The expected result is Negative. Fact Sheet for Patients: SugarRoll.be Fact  Sheet for Healthcare Providers: quierodirigir.com This test is not yet approved or cleared by the [place] FDA and  has been authorized for detection and/or diagnosis of SARS-CoV-2 by FDA under an Emergency Use Authorization (EUA). This EUA will remain  in effect (meaning this test can be used) for the duration of the COVID-19 declaration under Section 56 4(b)(1) of the Act, 21 U.S.C. section 360bbb-3(b)(1), unless the authorization is terminated or revoked sooner. Performed at [facility], [address], [place], [place] [address]   Surgical pcr screen     Status: None   Collection Time: 11/19/19 12:35 AM   Specimen: Nasal Mucosa; Nasal Swab  Result Value Ref Range Status   MRSA, PCR NEGATIVE NEGATIVE Final   Staphylococcus aureus NEGATIVE NEGATIVE Final    Comment: (NOTE) The Xpert SA Assay (FDA approved for NASAL specimens in patients 74 years of age and older), is one component of a comprehensive surveillance  program. It is not intended to diagnose infection nor to guide or monitor treatment. Performed at [facility], [address] [place], [place] [address]          Radiology Studies: Pelvis Portable  Result Date: 11/19/2019 CLINICAL DATA:  Postop, PACU EXAM: PORTABLE PELVIS 1-2 VIEWS COMPARISON:  Radiograph 11/17/2019 FINDINGS: Patient is post right hip hemiarthroplasty with expected postoperative soft tissue changes including subcutaneous and intra-articular gas of the right hip. Prosthesis is in normal, expected alignment without periprosthetic fracture or other acute complication. Mild degenerative changes. Remaining bones of the pelvis are intact. IMPRESSION: Status post right hip hemiarthroplasty with expected postoperative soft tissue changes. No periprosthetic fracture or other acute complication. Electronically Signed   By: Kreg Shropshire M.D.   On: 11/19/2019 17:33   ECHOCARDIOGRAM LIMITED  Result Date: 11/18/2019   ECHOCARDIOGRAM LIMITED REPORT   Patient Name:   ZILPHIA KOZINSKI Date of Exam: 11/18/2019 Medical Rec #:  [id number]      Height:       63.0 in Accession #:    [id number]     Weight:       89.9 lb Date of Birth:  11-08-1940      BSA:          1.38 m Patient Age:    78 years       BP:           100/66 mmHg Patient Gender: F              HR:           111 bpm. [facility]am [facility]  Procedure: Limited Echo and Color Doppler Indications:    Abnormal EKG  History:        Patient has prior history of Echocardiogram examinations, most                 recent 06/22/2017. Right hip fracture.  Sonographer:    Lavenia Atlas RDCS Referring Phys: [phone] [facility]  Sonographer Comments: Image acquisition challenging due to uncooperative patient and pain in R hip. IMPRESSIONS  1. Left ventricular ejection fraction, by visual estimation, is <20%. The left ventricle has severely decreased function. There is no increased left ventricular wall thickness.  2. Left ventricular  diastolic parameters are consistent with Grade I diastolic dysfunction (impaired relaxation).  3. The left ventricle demonstrates global hypokinesis.  4. Mild to moderate mitral annular calcification.  5. The mitral valve is grossly normal. Trivial mitral valve regurgitation.  6. No significant  change from prior study. FINDINGS  Left Ventricle: Left ventricular ejection fraction, by visual estimation, is <20%. The left ventricle has severely decreased function. The left ventricle demonstrates global hypokinesis. There is no increased left ventricular wall thickness. Left ventricular diastolic parameters are consistent with Grade I diastolic dysfunction (impaired relaxation). Indeterminate filling pressures. Mitral Valve: The mitral valve is grossly normal. Mild to moderate mitral annular calcification. Trivial mitral valve regurgitation.  LEFT VENTRICLE          Normals PLAX 2D LVIDd:         4.19 cm  3.6 cm   Diastology               Normals LVIDs:         3.86 cm  1.7 cm   LV e' lateral: 4.03 cm/s 6.42 cm/s LV PW:         1.04 cm  1.4 cm   LV e' medial:  2.39 cm/s 6.96 cm/s LV IVS:        0.89 cm  1.3 cm LVOT diam:     1.80 cm  2.0 cm LV SV:         14 ml    79 ml LV SV Index:   10.37    45 ml/m2 LVOT Area:     2.54 cm 3.14 cm2  LEFT ATRIUM         Index LA diam:    3.20 cm 2.32 cm/m   AORTA                 Normals Ao Root diam: 2.40 cm 31 mm  SHUNTS Systemic Diam: 1.80 cm  Zoila Shutter MD Electronically signed by Zoila Shutter MD Signature Date/Time: 11/18/2019/3:52:27 PMThe mitral valve is grossly normal.    Final       Scheduled Meds: . acetaminophen  500 mg Oral Q6H  . aspirin  81 mg Oral Daily  . atorvastatin  40 mg Oral q1800  . carvedilol  3.125 mg Oral BID WC  . Chlorhexidine Gluconate Cloth  6 each Topical Daily  . docusate sodium  100 mg Oral BID  . enoxaparin (LOVENOX) injection  30 mg Subcutaneous Q24H  . feeding supplement (ENSURE ENLIVE)  237 mL Oral BID BM  . multivitamin with  minerals  1 tablet Oral Daily  . potassium chloride  10 mEq Oral QODAY  . QUEtiapine  25 mg Oral BID   Continuous Infusions:    LOS: 2 days   Hughie Closs, MD [facility] Pager: [email or web address] 11/20/2019, 11:06 AM

## 2019-11-20 NOTE — Plan of Care (Signed)

## 2019-11-21 LAB — CBC
HCT: 38.9 % (ref 36.0–46.0)
Hemoglobin: 12.6 g/dL (ref 12.0–15.0)
MCH: 31.7 pg (ref 26.0–34.0)
MCHC: 32.4 g/dL (ref 30.0–36.0)
MCV: 97.7 fL (ref 80.0–100.0)
Platelets: 127 10*3/uL — ABNORMAL LOW (ref 150–400)
RBC: 3.98 MIL/uL (ref 3.87–5.11)
RDW: 13.1 % (ref 11.5–15.5)
WBC: 8 10*3/uL (ref 4.0–10.5)
nRBC: 0 % (ref 0.0–0.2)

## 2019-11-21 MED ORDER — QUETIAPINE FUMARATE 25 MG PO TABS: 25.0000 mg | ORAL_TABLET | Freq: Every day | ORAL | 0 refills | Status: DC

## 2019-11-21 MED ORDER — BISACODYL 10 MG RE SUPP: 10.0000 mg | Freq: Every day | RECTAL | 0 refills | Status: AC | PRN

## 2019-11-21 MED ORDER — CARVEDILOL 3.125 MG PO TABS: 3.1250 mg | ORAL_TABLET | Freq: Two times a day (BID) | ORAL | 0 refills | Status: AC

## 2019-11-21 MED ORDER — DOCUSATE SODIUM 100 MG PO CAPS: 100.0000 mg | ORAL_CAPSULE | Freq: Two times a day (BID) | ORAL | 0 refills | Status: AC

## 2019-11-21 MED ORDER — ATORVASTATIN CALCIUM 40 MG PO TABS: 40.0000 mg | ORAL_TABLET | Freq: Every day | ORAL | 0 refills | Status: DC

## 2019-11-21 NOTE — Progress Notes (Signed)
PROGRESS NOTE    Ruth Davidson   YQM:[id number]  DOB: [birth year]  DOA: 11/17/2019 PCP: Avon Gully, MD   Brief Narrative:  Ruth Davidson  is a 79 y.o. female,  Hypothyroid Dementia, CAD, CHF, Copd who apparently had unwitnessed fall and complains of right hip pain.  In ED found to have a right femoral neck fracture. She was transferred from [facility] to [facility] for surgery as her EF is < 20% and no cardiology coverage was available on the weekend.   Subjective: Seen and examined.  Awake this AM looks comfortably but confused which appears to be her baseline. No complaints.   Assessment & Plan:   Principal Problem:   Femoral neck fracture -S/p right hemiarthroplasty on 11/18/2018.  No complaints of pain today.  Seen by orthopedic.  Management per them. On Lovenox, and pain control.  Active Problems:   CAD S/P remote PCI - cont ASA, Coreg, Lipitor    Dementia  - follow for sundowning, behavioral issues post op - cont Seroquel.  Currently no signs of sundowning.  Chronic combined systolic and diastolic CHF (congestive heart failure) - Lasix was being held due to being NPO.  Although she has been started on heart healthy diet but she looks dehydrated clinically so I will continue to hold Lasix again for now  Acute blood loss anemia/postoperative anemia: Hemoglobin dropped from 14.2 at the time of admission to 11.9 1/18.  No indication of transfusion.  Repeat CBC pending this AM. Incision site at right hip looks well without any bleeding or any signs of infection.  Time spent in minutes: 32 minutes  DVT prophylaxis: per ortho post op, she currently is Lovenox 30 twice daily  Code Status: DNR Family Communication: None  Disposition Plan: Therapy Eval recommend 24 hr supervision/assistance, likely can return to memory care.  Consultants:   ortho Procedures:   2 D ECHO 1. Left ventricular ejection fraction, by visual estimation, is <20%. The  left ventricle has severely decreased function. There is no increased left ventricular wall thickness.  2. Left ventricular diastolic parameters are consistent with Grade I diastolic dysfunction (impaired relaxation).  3. The left ventricle demonstrates global hypokinesis.  4. Mild to moderate mitral annular calcification.  5. The mitral valve is grossly normal. Trivial mitral valve regurgitation.  6. No significant change from prior study.  Antimicrobials:  Anti-infectives (From admission, onward)   Start     Dose/Rate Route Frequency Ordered Stop   11/20/19 0000  ceFAZolin (ANCEF) IVPB 2g/100 mL premix     2 g 200 mL/hr over 30 Minutes Intravenous Every 12 hours 11/19/19 1619 11/19/19 2359   11/19/19 0600  ceFAZolin (ANCEF) IVPB 2g/100 mL premix     2 g 200 mL/hr over 30 Minutes Intravenous To Short Stay 11/19/19 0014 11/19/19 1249       Objective: Vitals:   11/20/19 0753 11/20/19 1302 11/20/19 1954 11/21/19 0342  BP: (!) 106/52 (!) 114/56 124/90 122/83  Pulse: 79 98 (!) 108 (!) 106  Resp: 16 18 20 15   Temp: 98.4 F (36.9 C) 98.5 F (36.9 C) 97.9 F (36.6 C) 98.3 F (36.8 C)  TempSrc: Oral Oral    SpO2: 100% 100% 99%   Weight:      Height:        Intake/Output Summary (Last 24 hours) at 11/21/2019 0828 Last data filed at 11/20/2019 1737 Gross per 24 hour  Intake 420 ml  Output 600 ml  Net -180  ml   Filed Weights   11/18/19 0200 11/19/19 0500 11/20/19 0540  Weight: 40.8 kg 40.3 kg 46 kg    Examination: General exam: Appears calm, alert but disoriented Respiratory system: Clear to auscultation. Respiratory effort normal. Cardiovascular system: S1 & S2 heard, RRR. No JVD, murmurs, rubs, gallops or clicks. No pedal edema. Gastrointestinal system: Abdomen is nondistended, soft and nontender. No organomegaly or masses felt. Normal bowel sounds heard. Central nervous system: Slightly lethargic and partially oriented. Skin: No rashes, lesions or ulcers.   Data  Reviewed: I have personally reviewed following labs and imaging studies  CBC: Recent Labs  Lab 11/17/19 2330 11/18/19 1029 11/20/19 0449  WBC 10.4 9.9 7.2  NEUTROABS 8.8*  --   --   HGB 14.2 14.0 11.9*  HCT 46.0 44.6 37.8  MCV 103.4* 102.3* 100.0  PLT 125* 118* 99*   Basic Metabolic Panel: Recent Labs  Lab 11/17/19 2330 11/18/19 1029 11/20/19 0449  NA 140 140 142  K 3.6 4.2 4.6  CL 108 107 107  CO2 24 26 27   GLUCOSE 138* 102* 96  BUN 27* 21 21  CREATININE 1.25* 1.05* 0.96  CALCIUM 7.7* 8.1* 8.4*   GFR: Estimated Creatinine Clearance: 35.6 mL/min (by C-G formula based on SCr of 0.96 mg/dL). Liver Function Tests: Recent Labs  Lab 11/18/19 1029  AST 46*  ALT 34  ALKPHOS 74  BILITOT 0.7  PROT 5.6*  ALBUMIN 3.1*   No results for input(s): LIPASE, AMYLASE in the last 168 hours. No results for input(s): AMMONIA in the last 168 hours. Coagulation Profile: Recent Labs  Lab 11/18/19 1029  INR 1.0   Cardiac Enzymes: No results for input(s): CKTOTAL, CKMB, CKMBINDEX, TROPONINI in the last 168 hours. BNP (last 3 results) No results for input(s): PROBNP in the last 8760 hours. HbA1C: No results for input(s): HGBA1C in the last 72 hours. CBG: No results for input(s): GLUCAP in the last 168 hours. Lipid Profile: No results for input(s): CHOL, HDL, LDLCALC, TRIG, CHOLHDL, LDLDIRECT in the last 72 hours. Thyroid Function Tests: Recent Labs    11/18/19 1036  TSH 0.390   Anemia Panel: No results for input(s): VITAMINB12, FOLATE, FERRITIN, TIBC, IRON, RETICCTPCT in the last 72 hours. Urine analysis:    Component Value Date/Time   COLORURINE YELLOW 11/19/2019 1718   APPEARANCEUR CLEAR 11/19/2019 1718   LABSPEC 1.030 11/19/2019 1718   PHURINE 5.0 11/19/2019 1718   GLUCOSEU NEGATIVE 11/19/2019 1718   HGBUR MODERATE (A) 11/19/2019 1718   BILIRUBINUR NEGATIVE 11/19/2019 1718   KETONESUR 20 (A) 11/19/2019 1718   PROTEINUR NEGATIVE 11/19/2019 1718   NITRITE  NEGATIVE 11/19/2019 1718   LEUKOCYTESUR SMALL (A) 11/19/2019 1718   Sepsis Labs: @LABRCNTIP (procalcitonin:4,lacticidven:4) ) Recent Results (from the past 240 hour(s))  SARS CORONAVIRUS 2 (TAT 6-24 HRS) Nasopharyngeal Nasopharyngeal Swab     Status: None   Collection Time: 11/18/19  3:55 AM   Specimen: Nasopharyngeal Swab  Result Value Ref Range Status   SARS Coronavirus 2 NEGATIVE NEGATIVE Final    Comment: (NOTE) SARS-CoV-2 target nucleic acids are NOT DETECTED. The SARS-CoV-2 RNA is generally detectable in upper and lower respiratory specimens during the acute phase of infection. Negative results do not preclude SARS-CoV-2 infection, do not rule out co-infections with other pathogens, and should not be used as the sole basis for treatment or other patient management decisions. Negative results must be combined with clinical observations, patient history, and epidemiological information. The expected result is Negative. Fact Sheet for Patients:  HairSlick.no Fact Sheet for Healthcare Providers: quierodirigir.com This test is not yet approved or cleared by the [place] FDA and  has been authorized for detection and/or diagnosis of SARS-CoV-2 by FDA under an Emergency Use Authorization (EUA). This EUA will remain  in effect (meaning this test can be used) for the duration of the COVID-19 declaration under Section 56 4(b)(1) of the Act, 21 U.S.C. section 360bbb-3(b)(1), unless the authorization is terminated or revoked sooner. Performed at [facility], [address], [place], [place] [address]   Surgical pcr screen     Status: None   Collection Time: 11/19/19 12:35 AM   Specimen: Nasal Mucosa; Nasal Swab  Result Value Ref Range Status   MRSA, PCR NEGATIVE NEGATIVE Final   Staphylococcus aureus NEGATIVE NEGATIVE Final    Comment: (NOTE) The Xpert SA Assay (FDA approved for NASAL specimens in patients 22 years  of age and older), is one component of a comprehensive surveillance program. It is not intended to diagnose infection nor to guide or monitor treatment. Performed at [facility], [address], [place], [place] [address]     Radiology Studies: Pelvis Portable  Result Date: 11/19/2019 CLINICAL DATA:  Postop, PACU EXAM: PORTABLE PELVIS 1-2 VIEWS COMPARISON:  Radiograph 11/17/2019 FINDINGS: Patient is post right hip hemiarthroplasty with expected postoperative soft tissue changes including subcutaneous and intra-articular gas of the right hip. Prosthesis is in normal, expected alignment without periprosthetic fracture or other acute complication. Mild degenerative changes. Remaining bones of the pelvis are intact. IMPRESSION: Status post right hip hemiarthroplasty with expected postoperative soft tissue changes. No periprosthetic fracture or other acute complication. Electronically Signed   By: Kreg Shropshire M.D.   On: 11/19/2019 17:33   Scheduled Meds: . aspirin  81 mg Oral Daily  . atorvastatin  40 mg Oral q1800  . carvedilol  3.125 mg Oral BID WC  . Chlorhexidine Gluconate Cloth  6 each Topical Daily  . docusate sodium  100 mg Oral BID  . enoxaparin (LOVENOX) injection  30 mg Subcutaneous Q24H  . feeding supplement (ENSURE ENLIVE)  237 mL Oral BID BM  . multivitamin with minerals  1 tablet Oral Daily  . potassium chloride  10 mEq Oral QODAY  . QUEtiapine  25 mg Oral BID   Continuous Infusions:   LOS: 3 days   Valisa Karpel Vergie Living, MD [facility] Pager: [email or web address] 11/21/2019, 8:28 AM

## 2019-11-21 NOTE — Progress Notes (Signed)
     Subjective: Patient unresponsive to commands due to dementia. She does say she is in pain and is not willing to be moved.   Objective:   VITALS:   Vitals:   11/20/19 0753 11/20/19 1302 11/20/19 1954 11/21/19 0342  BP: (!) 106/52 (!) 114/56 124/90 122/83  Pulse: 79 98 (!) 108 (!) 106  Resp: 16 18 20 15   Temp: 98.4 F (36.9 C) 98.5 F (36.9 C) 97.9 F (36.6 C) 98.3 F (36.8 C)  TempSrc: Oral Oral    SpO2: 100% 100% 99%   Weight:      Height:       Physical Exam: General: In no acute distress. Supine in bed. Would not respond to commands for movement of ankle or feet.  ABD soft  Feet warm Sterile dressing has been removed, steristrips intact   Lab Results  Component Value Date   WBC 7.2 11/20/2019   HGB 11.9 (L) 11/20/2019   HCT 37.8 11/20/2019   MCV 100.0 11/20/2019   PLT 99 (L) 11/20/2019   BMET    Component Value Date/Time   NA 142 11/20/2019 0449   K 4.6 11/20/2019 0449   CL 107 11/20/2019 0449   CO2 27 11/20/2019 0449   GLUCOSE 96 11/20/2019 0449   BUN 21 11/20/2019 0449   CREATININE 0.96 11/20/2019 0449   CREATININE 1.10 (H) 08/20/2017 1247   CALCIUM 8.4 (L) 11/20/2019 0449   GFRNONAA 57 (L) 11/20/2019 0449   GFRAA >60 11/20/2019 0449     Assessment/Plan: 2 Days Post-Op   Principal Problem:   Fracture of femoral neck, right (HCC) Active Problems:   CAD S/P remote PCI   Dementia (HCC)   Chronic combined systolic and diastolic CHF (congestive heart failure) (HCC)   Chronic systolic CHF (congestive heart failure) (HCC)  Closed right femoral neck fracture, status post Hemiarthroplasty Postop day 2 Continue PRN pain control  Plan: Up with therapy Apply ice PRN  Weightbearing: WBAT RLE. Posterior Hip Precautions. Insicional and dressing care: Reapply new sterile dressing to operative wound Showering: Keep dressing dry VTE prophylaxis: Lovenox, SCDs, ambulation Pain control: Minimize narcotics.  Continue current PRN medications Follow  - up plan: 2 weeks  Dispo: Therapy recommending SNF at discharge. Previously at [facility].  [facility] 11/21/2019, 6:26 AM   11/23/2019, MD Cell [phone]

## 2019-11-21 NOTE — Plan of Care (Signed)

## 2019-11-21 NOTE — TOC Initial Note (Signed)
Transition of Care [facility]) - Initial/Assessment Note    Patient Details  Name: Ruth Davidson MRN: [id number] Date of Birth: 1941/01/25  Transition of Care [facility]) CM/SW Contact:    Truddie Hidden, LCSW Phone Number: 11/21/2019, 2:56 PM  Clinical Narrative:                 Admitted female,  Hypothyroid Dementia, CAD, CHF (diastolic), Copd apparently had unwitnessed fall and complains of right hip pain.  CSW spoke with Carollee Herter at [facility] who states that the patient is a long term resident at their facility.   Carollee Herter states that patient is able to return when medically ready.   CSW will continue to follow for dispo needs. Will need a COVID test prior to discharge.   Expected Discharge Plan: Skilled Nursing Facility Barriers to Discharge: Continued Medical Work up   Patient Goals and CMS Choice Patient states their goals for this hospitalization and ongoing recovery are:: unable to assess; altered      Expected Discharge Plan and Services Expected Discharge Plan: Skilled Nursing Facility In-house Referral: Clinical Social Work     Living arrangements for the past 2 months: Skilled Nursing Facility                                      Prior Living Arrangements/Services Living arrangements for the past 2 months: Skilled Nursing Facility Lives with:: Facility Resident                   Activities of Daily Living Home Assistive Devices/Equipment: Wheelchair(from rehab center) ADL Screening (condition at time of admission) Patient's cognitive ability adequate to safely complete daily activities?: No Is the patient deaf or have difficulty hearing?: No Does the patient have difficulty seeing, even when wearing glasses/contacts?: Yes Does the patient have difficulty concentrating, remembering, or making decisions?: Yes Patient able to express need for assistance with ADLs?: No Does the patient have difficulty dressing or bathing?: Yes Independently performs  ADLs?: No Does the patient have difficulty walking or climbing stairs?: Yes Weakness of Legs: Both Weakness of Arms/Hands: Both  Permission Sought/Granted                  Emotional Assessment              Admission diagnosis:  Femoral neck fracture (HCC) [S72.009A] Hip fracture (HCC) [S72.009A] Closed fracture of right hip, initial encounter (HCC) [S72.001A] Patient Active Problem List   Diagnosis Date Noted  . Chronic systolic CHF (congestive heart failure) (HCC) 11/19/2019  . Fracture of femoral neck, right (HCC) 11/18/2019  . Hyperthyroidism 05/18/2019  . Hypokalemia 05/17/2019  . Protein-calorie malnutrition, severe 05/17/2019  . Severe protein-calorie malnutrition (HCC) 05/17/2019  . Palliative care by specialist   . Failure to thrive in adult   . Orthostatic hypotension 06/22/2017  . Chronic combined systolic and diastolic CHF (congestive heart failure) (HCC) 06/22/2017  . Goals of care, counseling/discussion   . Palliative care encounter   . Pre-syncope 06/21/2017  . Subdural hematoma (HCC) 06/21/2017  . Acute on chronic systolic CHF (congestive heart failure) (HCC) 08/28/2016  . Noncompliance 08/28/2016  . 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 Emphysema of lung (HCC) 06/11/2016  . 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 Dementia (HCC) 06/11/2016  . NSVT (nonsustained ventricular tachycardia) (HCC) 04/15/2016  . Acute systolic CHF (congestive heart failure) (HCC) 04/15/2016  . Community acquired pneumonia   .  CAD S/P remote PCI 08/30/2013  . Hyperlipidemia 08/30/2013  . Tobacco abuse 08/30/2013   PCP:  Rosita Fire, MD Pharmacy:   [place], [place] - [place] [address] [place] [address] [place] [address] Phone: [phone] Fax: [phone]     Social Determinants of Health (SDOH) Interventions    Readmission Risk Interventions Readmission Risk Prevention Plan 05/22/2019  Transportation Screening Complete   Home Care Screening Complete  Medication Review (RN CM) Complete  Some recent data might be hidden

## 2019-11-21 NOTE — Plan of Care (Signed)

## 2019-11-22 LAB — CBC
HCT: 36.6 % (ref 36.0–46.0)
Hemoglobin: 11.7 g/dL — ABNORMAL LOW (ref 12.0–15.0)
MCH: 31.3 pg (ref 26.0–34.0)
MCHC: 32 g/dL (ref 30.0–36.0)
MCV: 97.9 fL (ref 80.0–100.0)
Platelets: 136 10*3/uL — ABNORMAL LOW (ref 150–400)
RBC: 3.74 MIL/uL — ABNORMAL LOW (ref 3.87–5.11)
RDW: 12.9 % (ref 11.5–15.5)
WBC: 6.4 10*3/uL (ref 4.0–10.5)
nRBC: 0 % (ref 0.0–0.2)

## 2019-11-22 LAB — BASIC METABOLIC PANEL
Anion gap: 9 (ref 5–15)
BUN: 18 mg/dL (ref 8–23)
CO2: 26 mmol/L (ref 22–32)
Calcium: 8.1 mg/dL — ABNORMAL LOW (ref 8.9–10.3)
Chloride: 103 mmol/L (ref 98–111)
Creatinine, Ser: 0.89 mg/dL (ref 0.44–1.00)
GFR calc Af Amer: >60 mL/min (ref >60)
GFR calc non Af Amer: >60 mL/min (ref >60)
Glucose, Bld: 89 mg/dL (ref 70–99)
Potassium: 3.9 mmol/L (ref 3.5–5.1)
Sodium: 138 mmol/L (ref 135–145)

## 2019-11-22 LAB — SARS CORONAVIRUS 2 (TAT 6-24 HRS): SARS Coronavirus 2: NEGATIVE

## 2019-11-22 NOTE — Progress Notes (Signed)
     Subjective:  Patient minimally interactive due to dementia. Denies any pain today. Sleeping comfortably in bed. Able to follow some basic commands today.   Objective:   VITALS:   Vitals:   11/21/19 1635 11/21/19 2030 11/22/19 0442 11/22/19 0759  BP: 101/70 117/62 102/68 108/69  Pulse: 80 76 85 85  Resp:  19 18 15   Temp:  98.9 F (37.2 C) 97.9 F (36.6 C) 97.7 F (36.5 C)  TempSrc:    Oral  SpO2: 100% 91% 98% 100%  Weight:      Height:       Physical Exam: General: In no acute distress. Supine in bed.  Resp: No use of accessory musculature.  GI: abdomen soft MSK: Patient able to move all toes of right foot and responded yes when questioned if she could feel palpation to right foot and toes. Pulses intact distally. Steristrips intact.  Lab Results  Component Value Date   WBC 6.4 11/22/2019   HGB 11.7 (L) 11/22/2019   HCT 36.6 11/22/2019   MCV 97.9 11/22/2019   PLT 136 (L) 11/22/2019   BMET    Component Value Date/Time   NA 138 11/22/2019 0231   K 3.9 11/22/2019 0231   CL 103 11/22/2019 0231   CO2 26 11/22/2019 0231   GLUCOSE 89 11/22/2019 0231   BUN 18 11/22/2019 0231   CREATININE 0.89 11/22/2019 0231   CREATININE 1.10 (H) 08/20/2017 1247   CALCIUM 8.1 (L) 11/22/2019 0231   GFRNONAA >60 11/22/2019 0231   GFRAA >60 11/22/2019 0231     Assessment/Plan: 3 Days Post-Op   Principal Problem:   Fracture of femoral neck, right (HCC) Active Problems:   CAD S/P remote PCI   Dementia (HCC)   Chronic combined systolic and diastolic CHF (congestive heart failure) (HCC)   Chronic systolic CHF (congestive heart failure) (HCC)  Closedrightfemoral neckfracture, status post Hemiarthroplasty Postop day 3 Continue PRN pain control  Plan: Up with therapy Apply ice PRN  Weightbearing:WBATRLE. Posterior Hip Precautions. Insicional and dressing care: Reapply new sterile dressing to operative wound Showering: Keep dressing dry VTE  prophylaxis:Lovenox,SCDs, ambulation Pain control: Minimize narcotics. Continue current PRN medications Follow - up plan: 2 weeks  Dispo: OK for discharge from orthopedic standpoint. Therapy recommending SNF at discharge. According to [facility] team, able to return to [facility] care. COVID test 11/20/18 resulted negative.   11/22/18 11/22/2019, 8:47 AM   11/24/2019, MD Cell [phone]

## 2019-11-22 NOTE — Plan of Care (Signed)
  Problem: Safety: Goal: Ability to remain free from injury will improve Outcome: Progressing   

## 2019-11-22 NOTE — Discharge Summary (Signed)
Discharge Summary  Ruth SagesJudy M Obar ZOX:[id number] DOB: 1941-01-16  PCP: Avon GullyFanta, Tesfaye, MD  Admit date: 11/17/2019 Discharge date: 11/22/2019  Recommendations for Outpatient Follow-up:  1. Orthopedics 2 weeks   Discharge Diagnoses:  Active Hospital Problems   Diagnosis Date Noted  . Fracture of femoral neck, right (HCC) 11/18/2019  . Chronic systolic CHF (congestive heart failure) (HCC) 11/19/2019  . Chronic combined systolic and diastolic CHF (congestive heart failure) (HCC) 06/22/2017  . Dementia (HCC) 06/11/2016  . CAD S/P remote PCI 08/30/2013    Resolved Hospital Problems   Diagnosis Date Noted Date Resolved  . Hip fracture (HCC) 11/18/2019 11/19/2019   Discharge Condition: Stable   Diet recommendation: Heart Healthy   Vitals:   11/22/19 0442 11/22/19 0759  BP: 102/68 108/69  Pulse: 85 85  Resp: 18 15  Temp: 97.9 F (36.6 C) 97.7 F (36.5 C)  SpO2: 98% 100%   HPI and Brief Hospital Course:  Ruth Davidson Ruth Davidson a79 y.o.female,Hypothyroid Dementia, CAD, CHF, Copd who apparently had unwitnessed fall and complains of right hip pain.  In ED found to have a right femoral neck fracture. [facility] as her EF is < 20% and no cardiology coverage was available on the weekend.    She underwent right hemiarthoplasty on 11/19/19 which she tolerated well. She has been on Lovenox for DVT prophylaxis. We held her Lasix in the hospital due to euvolemia, but she can restart Lasix at discharge.  Discharge details, plan of care and follow up instructions were discussed with patient and any available family or care providers. Patient and family are in agreement with discharge from the hospital today and all questions were answered to their satisfaction.  Procedures:  R hemi-arthoplastry 11/19/19   Consultations:  Orthopedics   Discharge Exam: BP 108/69 (BP Location: Right Arm)   Pulse 85   Temp 97.7 F  (36.5 C) (Oral)   Resp 15   Ht 5\' 3"  (1.6 m)   Wt 46 kg   SpO2 100%   BMI 17.96 kg/m  General:  Alert, calm, in no acute distress  Eyes: EOMI, clear sclerea Cardiovascular: RRR, no murmurs or rubs, no peripheral edema  Respiratory: clear to auscultation bilaterally, no wheezes, no crackles  Abdomen: soft, nontender, nondistended, normal bowel tones heard  Skin: dry, no rashes  Musculoskeletal: no joint effusions, normal range of motion  Psychiatric: appropriate affect, normal speech  Neurologic: extraocular muscles intact, clear speech, moving all extremities with intact sensorium   Discharge Instructions You were cared for by a hospitalist during your hospital stay. If you have any questions about your discharge medications or the care you received while you were in the hospital after you are discharged, you can call the unit and asked to speak with the hospitalist on call if the hospitalist that took care of you is not available. Once you are discharged, your primary care physician will handle any further medical issues. Please note that NO REFILLS for any discharge medications will be authorized once you are discharged, as it is imperative that you return to your primary care physician (or establish a relationship with a primary care physician if you do not have one) for your aftercare needs so that they can reassess your need for medications and monitor your lab values.  Weightbearing:WBATRLE. Posterior Hip Precautions. Insicional and dressing care:Reapplynewsterile dressing to operative wound Showering: Keep dressing dry VTE prophylaxis:Lovenox,SCDs, ambulation Pain control: Minimize narcotics. Continue currentPRN medications  Follow - up plan: 2 weeks  Discharge Instructions    Diet - low sodium heart healthy   Complete by: As directed    Increase activity slowly   Complete by: As directed      Allergies as of 11/22/2019   No Known Allergies     Medication List      STOP taking these medications   simvastatin 80 MG tablet Commonly known as: ZOCOR Replaced by: atorvastatin 40 MG tablet     TAKE these medications   aspirin EC 81 MG tablet Take 81 mg by mouth daily.   atorvastatin 40 MG tablet Commonly known as: LIPITOR Take 1 tablet (40 mg total) by mouth daily at 6 PM. Replaces: simvastatin 80 MG tablet   bisacodyl 10 MG suppository Commonly known as: DULCOLAX Place 1 suppository (10 mg total) rectally daily as needed for moderate constipation.   carvedilol 3.125 MG tablet Commonly known as: COREG Take 1 tablet (3.125 mg total) by mouth 2 (two) times daily with a meal. What changed:   how much to take  how to take this  when to take this  additional instructions   Cholecalciferol 50 MCG (2000 UT) Caps Take 1 capsule by mouth daily.   docusate sodium 100 MG capsule Commonly known as: COLACE Take 1 capsule (100 mg total) by mouth 2 (two) times daily.   enoxaparin 30 MG/0.3ML injection Commonly known as: Lovenox Inject 0.3 mLs (30 mg total) into the skin daily.   feeding supplement (ENSURE ENLIVE) Liqd Take 237 mLs by mouth 2 (two) times daily between meals.   furosemide 20 MG tablet Commonly known as: LASIX Take 1 tablet (20 mg total) by mouth every other day.   HYDROcodone-acetaminophen 5-325 MG tablet Commonly known as: Norco Take 1 tablet by mouth every 6 (six) hours as needed for severe pain.   Melatonin 3 MG Tabs Take 1 tablet by mouth at bedtime.   multivitamin with minerals Tabs tablet Take 1 tablet by mouth daily.   nitroGLYCERIN 0.4 MG SL tablet Commonly known as: NITROSTAT Place 1 tablet (0.4 mg total) under the tongue every 5 (five) minutes as needed for chest pain (or back pain or arm pain).   potassium chloride 10 MEQ tablet Commonly known as: KLOR-CON Take 1 tablet (10 mEq total) by mouth every other day. What changed: when to take this   QUEtiapine 25 MG tablet Commonly known as: SEROQUEL Take  1 tablet (25 mg total) by mouth at bedtime for 14 days.      No Known Allergies  Contact information for follow-up providers    Teryl Lucy, MD In 2 weeks.   Specialty: Orthopedic Surgery Contact information: [address] [place] [address] [phone]            Contact information for after-discharge care    Destination    [facility] .   Service: Skilled Nursing Contact information: [address] [place] [address] [phone]                   The results of significant diagnostics from this hospitalization (including imaging, microbiology, ancillary and laboratory) are listed below for reference.    Significant Diagnostic Studies: DG Chest 1 View  Result Date: 11/17/2019 CLINICAL DATA:  Recent right hip fracture EXAM: CHEST  1 VIEW COMPARISON:  05/06/2019 FINDINGS: Cardiac shadow is stable and accentuated by the frontal technique. The lungs are clear. No acute bony abnormality is seen.  IMPRESSION: No acute abnormality noted. Electronically Signed   By: Alcide Clever M.D.   On: 11/17/2019 23:32   Pelvis Portable  Result Date: 11/19/2019 CLINICAL DATA:  Postop, PACU EXAM: PORTABLE PELVIS 1-2 VIEWS COMPARISON:  Radiograph 11/17/2019 FINDINGS: Patient is post right hip hemiarthroplasty with expected postoperative soft tissue changes including subcutaneous and intra-articular gas of the right hip. Prosthesis is in normal, expected alignment without periprosthetic fracture or other acute complication. Mild degenerative changes. Remaining bones of the pelvis are intact. IMPRESSION: Status post right hip hemiarthroplasty with expected postoperative soft tissue changes. No periprosthetic fracture or other acute complication. Electronically Signed   By: Kreg Shropshire M.D.   On: 11/19/2019 17:33   DG Hip Unilat  With Pelvis 2-3 Views Right  Result Date: 11/17/2019 CLINICAL DATA:  Unwitnessed fall. EXAM: DG HIP (WITH OR  WITHOUT PELVIS) 2-3V RIGHT COMPARISON:  None. FINDINGS: There is a right femoral neck fracture with varus angulation. SI joints are symmetric. No subluxation or dislocation. IMPRESSION: Right femoral neck fracture with varus angulation. Electronically Signed   By: Charlett Nose M.D.   On: 11/17/2019 23:27   ECHOCARDIOGRAM LIMITED  Result Date: 11/18/2019   ECHOCARDIOGRAM LIMITED REPORT   Patient Name:   Ruth Davidson Date of Exam: 11/18/2019 Medical Rec #:  [id number]      Height:       63.0 in Accession #:    [id number]     Weight:       89.9 lb Date of Birth:  1941/11/02      BSA:          1.38 m Patient Age:    78 years       BP:           100/66 mmHg Patient Gender: F              HR:           111 bpm. [facility]  Procedure: Limited Echo and Color Doppler Indications:    Abnormal EKG  History:        Patient has prior history of Echocardiogram examinations, most                 recent 06/22/2017. Right hip fracture.  Sonographer:    Lavenia Atlas RDCS Referring Phys: [phone] [facility]  Sonographer Comments: Image acquisition challenging due to uncooperative patient and pain in R hip. IMPRESSIONS  1. Left ventricular ejection fraction, by visual estimation, is <20%. The left ventricle has severely decreased function. There is no increased left ventricular wall thickness.  2. Left ventricular diastolic parameters are consistent with Grade I diastolic dysfunction (impaired relaxation).  3. The left ventricle demonstrates global hypokinesis.  4. Mild to moderate mitral annular calcification.  5. The mitral valve is grossly normal. Trivial mitral valve regurgitation.  6. No significant change from prior study. FINDINGS  Left Ventricle: Left ventricular ejection fraction, by visual estimation, is <20%. The left ventricle has severely decreased function. The left ventricle demonstrates global hypokinesis. There is no increased left ventricular wall thickness. Left ventricular diastolic  parameters are consistent with Grade I diastolic dysfunction (impaired relaxation). Indeterminate filling pressures. Mitral Valve: The mitral valve is grossly normal. Mild to moderate mitral annular calcification. Trivial mitral valve regurgitation.  LEFT VENTRICLE          Normals PLAX 2D LVIDd:         4.19 cm  3.6 cm   Diastology  Normals LVIDs:         3.86 cm  1.7 cm   LV e' lateral: 4.03 cm/s 6.42 cm/s LV PW:         1.04 cm  1.4 cm   LV e' medial:  2.39 cm/s 6.96 cm/s LV IVS:        0.89 cm  1.3 cm LVOT diam:     1.80 cm  2.0 cm LV SV:         14 ml    79 ml LV SV Index:   10.37    45 ml/m2 LVOT Area:     2.54 cm 3.14 cm2  LEFT ATRIUM         Index LA diam:    3.20 cm 2.32 cm/m   AORTA                 Normals Ao Root diam: 2.40 cm 31 mm  SHUNTS Systemic Diam: 1.80 cm  Lyman Bishop MD Electronically signed by Lyman Bishop MD Signature Date/Time: 11/18/2019/3:52:27 PMThe mitral valve is grossly normal.    Final     Microbiology: Recent Results (from the past 240 hour(s))  SARS CORONAVIRUS 2 (TAT 6-24 HRS) Nasopharyngeal Nasopharyngeal Swab     Status: None   Collection Time: 11/18/19  3:55 AM   Specimen: Nasopharyngeal Swab  Result Value Ref Range Status   SARS Coronavirus 2 NEGATIVE NEGATIVE Final    Comment: (NOTE) SARS-CoV-2 target nucleic acids are NOT DETECTED. The SARS-CoV-2 RNA is generally detectable in upper and lower respiratory specimens during the acute phase of infection. Negative results do not preclude SARS-CoV-2 infection, do not rule out co-infections with other pathogens, and should not be used as the sole basis for treatment or other patient management decisions. Negative results must be combined with clinical observations, patient history, and epidemiological information. The expected result is Negative. Fact Sheet for Patients: SugarRoll.be Fact Sheet for Healthcare Providers: https://www.woods-mathews.com/ This  test is not yet approved or cleared by the [place] FDA and  has been authorized for detection and/or diagnosis of SARS-CoV-2 by FDA under an Emergency Use Authorization (EUA). This EUA will remain  in effect (meaning this test can be used) for the duration of the COVID-19 declaration under Section 56 4(b)(1) of the Act, 21 U.S.C. section 360bbb-3(b)(1), unless the authorization is terminated or revoked sooner. Performed at [facility], [place] [address] [place], [place] [address]   Surgical pcr screen     Status: None   Collection Time: 11/19/19 12:35 AM   Specimen: Nasal Mucosa; Nasal Swab  Result Value Ref Range Status   MRSA, PCR NEGATIVE NEGATIVE Final   Staphylococcus aureus NEGATIVE NEGATIVE Final    Comment: (NOTE) The Xpert SA Assay (FDA approved for NASAL specimens in patients 47 years of age and older), is one component of a comprehensive surveillance program. It is not intended to diagnose infection nor to guide or monitor treatment. Performed at [facility], [place] [address] [place], [place] [address]   SARS CORONAVIRUS 2 (TAT 6-24 HRS) Nasopharyngeal Nasopharyngeal Swab     Status: None   Collection Time: 11/21/19 11:35 PM   Specimen: Nasopharyngeal Swab  Result Value Ref Range Status   SARS Coronavirus 2 NEGATIVE NEGATIVE Final    Comment: (NOTE) SARS-CoV-2 target nucleic acids are NOT DETECTED. The SARS-CoV-2 RNA is generally detectable in upper and lower respiratory specimens during the acute phase of infection. Negative results do not preclude SARS-CoV-2 infection,  do not rule out co-infections with other pathogens, and should not be used as the sole basis for treatment or other patient management decisions. Negative results must be combined with clinical observations, patient history, and epidemiological information. The expected result is Negative. Fact Sheet for Patients: HairSlick.no Fact Sheet for Healthcare  Providers: quierodirigir.com This test is not yet approved or cleared by the [place] FDA and  has been authorized for detection and/or diagnosis of SARS-CoV-2 by FDA under an Emergency Use Authorization (EUA). This EUA will remain  in effect (meaning this test can be used) for the duration of the COVID-19 declaration under Section 56 4(b)(1) of the Act, 21 U.S.C. section 360bbb-3(b)(1), unless the authorization is terminated or revoked sooner. Performed at [facility], [address] [place], [place] [address]      Labs: Basic Metabolic Panel: Recent Labs  Lab 11/17/19 2330 11/18/19 1029 11/20/19 0449 11/22/19 0231  NA 140 140 142 138  K 3.6 4.2 4.6 3.9  CL 108 107 107 103  CO2 24 26 27 26   GLUCOSE 138* 102* 96 89  BUN 27* 21 21 18   CREATININE 1.25* 1.05* 0.96 0.89  CALCIUM 7.7* 8.1* 8.4* 8.1*   Liver Function Tests: Recent Labs  Lab 11/18/19 1029  AST 46*  ALT 34  ALKPHOS 74  BILITOT 0.7  PROT 5.6*  ALBUMIN 3.1*   No results for input(s): LIPASE, AMYLASE in the last 168 hours. No results for input(s): AMMONIA in the last 168 hours. CBC: Recent Labs  Lab 11/17/19 2330 11/18/19 1029 11/20/19 0449 11/21/19 0948 11/22/19 0231  WBC 10.4 9.9 7.2 8.0 6.4  NEUTROABS 8.8*  --   --   --   --   HGB 14.2 14.0 11.9* 12.6 11.7*  HCT 46.0 44.6 37.8 38.9 36.6  MCV 103.4* 102.3* 100.0 97.7 97.9  PLT 125* 118* 99* 127* 136*   Cardiac Enzymes: No results for input(s): CKTOTAL, CKMB, CKMBINDEX, TROPONINI in the last 168 hours. BNP: BNP (last 3 results) Recent Labs    05/16/19 2232  BNP 313.0*   ProBNP (last 3 results) No results for input(s): PROBNP in the last 8760 hours.  CBG: No results for input(s): GLUCAP in the last 168 hours.  Time spent: 35 minutes were spent in preparing this discharge including medication reconciliation, counseling, and coordination of care.  Signed: Burlene Montecalvo 05/18/19, MD  [facility] 11/22/2019, 9:31 AM

## 2019-11-22 NOTE — Plan of Care (Signed)

## 2019-11-22 NOTE — Clinical Social Work Note (Signed)
CSW spoke with Dorena Cookey) and patient is okay to dc back to SNF tomorrow 1/21.   COVID resulted negative today.   Plan is to discharge to SNF tomorrow.

## 2019-11-22 NOTE — Progress Notes (Signed)
COVID test obtained and charge RN walked down to lab @2345 

## 2019-11-22 NOTE — Consult Note (Signed)
   [facility] CM Inpatient Consult   11/22/2019  Ruth Davidson [birth year] [id number]    Patient screened for 27% high risk score for unplanned readmission with 2 hospitalizations in the past 6 months and to check forpotential [place] under her [name] plan.  Per MD notes, Krishauna Schatzman Newcombis a78 y.o.female, hx.Hypothyroid Dementia, CAD, CHF, Copd, who apparently had unwitnessed fall and complains of right hip pain, found to have a right femoral neck fracture. She was transferred from [facility] to [facility] for surgery. She underwent right hemiarthoplasty on 11/19/19.  Review of patient's medical record reveals per transition of care SW note that patient is from [facility] facility (resident in the memory care unit) and is a long term care resident there, with plan to return back when medically ready.   PT/ OT notes reviewed recommending skilled nursing facility at discharge.  Will sign off as patient's care will be met at that level of care.   For questions, please contact:   Edwena Felty A. Luchiano Viscomi, BSN, RN-BC [facility] Liaison Cell: [phone]

## 2019-11-22 NOTE — Progress Notes (Addendum)
Restraints not applied d/t patient calming down and also pt discharging tomorrow. Will continue to monitor and hold off on restraints.  No restraints applied to pt during night shift. Pt had pain and was treated with Vicodin, pt tolerated it well.

## 2019-11-22 NOTE — Progress Notes (Signed)
Physical Therapy Treatment Patient Details Name: SYMPHONIE SCHNEIDERMAN MRN: [id number] DOB: 12/25/1940 Today's Date: 11/22/2019    History of Present Illness Elayne Gruver is a 79 y.o. F s/p R hip hemi-arthroplasty secondary to a fracture of the femoral neck following a fall. She has a PMH of dementia, CAD, CHF, COPD    PT Comments    Pt admitted for above. Upon arrival, pt was contorted in the bed with head near side rail, but did not appear to be breaking her R posterior hip precautions. After righting her trunk in bed, reviewed her posterior hip precautions, but no evidence of carryover learning. However, she tends to avoid these positions anyway due to R hip pain. She required mod assist +2 for bed mobility and min assist/mod assist +2 for transfers to and from [facility] as described below. Her cognition continues to be limited but she seemed to follow commands better at the beginning of the session compared to the end. See that she is expected to be discharged to SNF soon, and continue to recommend this. She would benefit from continued skilled acute PT intervention in order to address deficits.   Follow Up Recommendations  SNF;Supervision/Assistance - 24 hour     Equipment Recommendations  Other (comment)    Recommendations for Other Services       Precautions / Restrictions Precautions Precautions: Posterior Hip;Fall Precaution Booklet Issued: Yes (comment) Precaution Comments: posterior precautions posted in pt's room Restrictions Weight Bearing Restrictions: Yes RLE Weight Bearing: Weight bearing as tolerated    Mobility  Bed Mobility Overal bed mobility: Needs Assistance Bed Mobility: Supine to Sit;Sit to Supine     Supine to sit: Mod assist;HOB elevated;+2 for physical assistance Sit to supine: Mod assist;+2 for physical assistance   General bed mobility comments: mod +2 for  B LE management and trunk righting; pt helped scoot herself towards HOB once seated  Transfers Overall  transfer level: Needs assistance Equipment used: 2 person hand held assist Transfers: Stand Pivot Transfers;Sit to/from Stand Sit to Stand: Min assist;+2 physical assistance Stand pivot transfers: Mod assist;+2 physical assistance       General transfer comment: pt states "please don't pull up on me" and was able to stand with min assist +2 for safety; stand pivot to [facility] with mod assist +2 for physical assistance, cueing to stay on task; stand pivot back to EOB  Ambulation/Gait                 Stairs             Wheelchair Mobility    Modified Rankin (Stroke Patients Only)       Balance Overall balance assessment: Needs assistance;History of Falls Sitting-balance support: No upper extremity supported;Feet unsupported Sitting balance-Leahy Scale: Fair Sitting balance - Comments: EOB for donning of gait belt; supervision   Standing balance support: Bilateral upper extremity supported;During functional activity Standing balance-Leahy Scale: Poor Standing balance comment: reliant on B UE external support                            Cognition Arousal/Alertness: Lethargic Behavior During Therapy: Flat affect Overall Cognitive Status: History of cognitive impairments - at baseline                                 General Comments: pt with hx of demetia; difficulty following commands, answering questions; decreased safety  awareness      Exercises General Exercises - Lower Extremity Long Arc Quad: AROM;Both;10 reps;Supine    General Comments        Pertinent Vitals/Pain Pain Assessment: Faces Faces Pain Scale: Hurts whole lot Pain Location: R hip, back Pain Descriptors / Indicators: Aching;Grimacing;Guarding;Moaning Pain Intervention(s): Limited activity within patient's tolerance;Monitored during session;Repositioned    Home Living                      Prior Function            PT Goals (current goals can now be  found in the care plan section) Acute Rehab PT Goals Patient Stated Goal: unable PT Goal Formulation: Patient unable to participate in goal setting Time For Goal Achievement: 12/04/19 Potential to Achieve Goals: Fair Progress towards PT goals: Progressing toward goals    Frequency    Min 3X/week      PT Plan Current plan remains appropriate    Co-evaluation              AM-PAC PT "6 Clicks" Mobility   Outcome Measure  Help needed turning from your back to your side while in a flat bed without using bedrails?: A Lot Help needed moving from lying on your back to sitting on the side of a flat bed without using bedrails?: A Lot Help needed moving to and from a bed to a chair (including a wheelchair)?: A Lot Help needed standing up from a chair using your arms (e.g., wheelchair or bedside chair)?: A Little Help needed to walk in hospital room?: Total Help needed climbing 3-5 steps with a railing? : Total 6 Click Score: 11    End of Session Equipment Utilized During Treatment: Gait belt Activity Tolerance: Patient limited by lethargy;Patient limited by pain Patient left: in bed;with call bell/phone within reach;with bed alarm set Nurse Communication: Mobility status PT Visit Diagnosis: Unsteadiness on feet (R26.81);Other abnormalities of gait and mobility (R26.89);History of falling (Z91.81);Muscle weakness (generalized) (M62.81);Pain;Other symptoms and signs involving the nervous system (R29.898) Pain - Right/Left: Right Pain - part of body: Hip     Time: 5366-4403 PT Time Calculation (min) (ACUTE ONLY): 21 min  Charges:  $Therapeutic Activity: 8-22 mins                     Christel Mormon, SPT    [place] [facility] 11/22/2019, 12:19 PM

## 2019-11-23 DIAGNOSIS — I471 Supraventricular tachycardia: Secondary | ICD-10-CM | POA: Diagnosis not present

## 2019-11-23 DIAGNOSIS — R451 Restlessness and agitation: Secondary | ICD-10-CM | POA: Diagnosis present

## 2019-11-23 DIAGNOSIS — R410 Disorientation, unspecified: Secondary | ICD-10-CM | POA: Diagnosis not present

## 2019-11-23 DIAGNOSIS — I252 Old myocardial infarction: Secondary | ICD-10-CM | POA: Diagnosis not present

## 2019-11-23 DIAGNOSIS — D649 Anemia, unspecified: Secondary | ICD-10-CM | POA: Diagnosis not present

## 2019-11-23 DIAGNOSIS — F05 Delirium due to known physiological condition: Secondary | ICD-10-CM | POA: Diagnosis not present

## 2019-11-23 DIAGNOSIS — R52 Pain, unspecified: Secondary | ICD-10-CM | POA: Diagnosis not present

## 2019-11-23 DIAGNOSIS — E785 Hyperlipidemia, unspecified: Secondary | ICD-10-CM | POA: Diagnosis not present

## 2019-11-23 DIAGNOSIS — E890 Postprocedural hypothyroidism: Secondary | ICD-10-CM | POA: Diagnosis present

## 2019-11-23 DIAGNOSIS — Z9861 Coronary angioplasty status: Secondary | ICD-10-CM | POA: Diagnosis not present

## 2019-11-23 DIAGNOSIS — S72092A Other fracture of head and neck of left femur, initial encounter for closed fracture: Secondary | ICD-10-CM | POA: Diagnosis not present

## 2019-11-23 DIAGNOSIS — F0391 Unspecified dementia with behavioral disturbance: Secondary | ICD-10-CM | POA: Diagnosis not present

## 2019-11-23 DIAGNOSIS — E43 Unspecified severe protein-calorie malnutrition: Secondary | ICD-10-CM | POA: Diagnosis not present

## 2019-11-23 DIAGNOSIS — Z20822 Contact with and (suspected) exposure to covid-19: Secondary | ICD-10-CM | POA: Diagnosis not present

## 2019-11-23 DIAGNOSIS — Z03818 Encounter for observation for suspected exposure to other biological agents ruled out: Secondary | ICD-10-CM | POA: Diagnosis not present

## 2019-11-23 DIAGNOSIS — I2510 Atherosclerotic heart disease of native coronary artery without angina pectoris: Secondary | ICD-10-CM | POA: Diagnosis not present

## 2019-11-23 DIAGNOSIS — R0902 Hypoxemia: Secondary | ICD-10-CM | POA: Diagnosis not present

## 2019-11-23 DIAGNOSIS — E440 Moderate protein-calorie malnutrition: Secondary | ICD-10-CM | POA: Diagnosis not present

## 2019-11-23 DIAGNOSIS — W19XXXA Unspecified fall, initial encounter: Secondary | ICD-10-CM | POA: Diagnosis present

## 2019-11-23 DIAGNOSIS — Z79899 Other long term (current) drug therapy: Secondary | ICD-10-CM | POA: Diagnosis not present

## 2019-11-23 DIAGNOSIS — Z66 Do not resuscitate: Secondary | ICD-10-CM | POA: Diagnosis not present

## 2019-11-23 DIAGNOSIS — S2232XA Fracture of one rib, left side, initial encounter for closed fracture: Secondary | ICD-10-CM | POA: Diagnosis not present

## 2019-11-23 DIAGNOSIS — F17210 Nicotine dependence, cigarettes, uncomplicated: Secondary | ICD-10-CM | POA: Diagnosis present

## 2019-11-23 DIAGNOSIS — E559 Vitamin D deficiency, unspecified: Secondary | ICD-10-CM | POA: Diagnosis not present

## 2019-11-23 DIAGNOSIS — E860 Dehydration: Secondary | ICD-10-CM | POA: Diagnosis present

## 2019-11-23 DIAGNOSIS — I5042 Chronic combined systolic (congestive) and diastolic (congestive) heart failure: Secondary | ICD-10-CM | POA: Diagnosis not present

## 2019-11-23 DIAGNOSIS — Z7401 Bed confinement status: Secondary | ICD-10-CM | POA: Diagnosis not present

## 2019-11-23 DIAGNOSIS — S72002A Fracture of unspecified part of neck of left femur, initial encounter for closed fracture: Secondary | ICD-10-CM | POA: Diagnosis not present

## 2019-11-23 DIAGNOSIS — R000 Tachycardia, unspecified: Secondary | ICD-10-CM | POA: Diagnosis not present

## 2019-11-23 DIAGNOSIS — E876 Hypokalemia: Secondary | ICD-10-CM | POA: Diagnosis not present

## 2019-11-23 DIAGNOSIS — J449 Chronic obstructive pulmonary disease, unspecified: Secondary | ICD-10-CM | POA: Diagnosis not present

## 2019-11-23 DIAGNOSIS — I420 Dilated cardiomyopathy: Secondary | ICD-10-CM | POA: Diagnosis not present

## 2019-11-23 DIAGNOSIS — M80052A Age-related osteoporosis with current pathological fracture, left femur, initial encounter for fracture: Secondary | ICD-10-CM | POA: Diagnosis not present

## 2019-11-23 DIAGNOSIS — S72009A Fracture of unspecified part of neck of unspecified femur, initial encounter for closed fracture: Secondary | ICD-10-CM | POA: Diagnosis present

## 2019-11-23 DIAGNOSIS — G4700 Insomnia, unspecified: Secondary | ICD-10-CM | POA: Diagnosis not present

## 2019-11-23 DIAGNOSIS — D62 Acute posthemorrhagic anemia: Secondary | ICD-10-CM | POA: Diagnosis not present

## 2019-11-23 DIAGNOSIS — E119 Type 2 diabetes mellitus without complications: Secondary | ICD-10-CM | POA: Diagnosis not present

## 2019-11-23 DIAGNOSIS — R404 Transient alteration of awareness: Secondary | ICD-10-CM | POA: Diagnosis not present

## 2019-11-23 DIAGNOSIS — M2550 Pain in unspecified joint: Secondary | ICD-10-CM | POA: Diagnosis not present

## 2019-11-23 DIAGNOSIS — S72001D Fracture of unspecified part of neck of right femur, subsequent encounter for closed fracture with routine healing: Secondary | ICD-10-CM | POA: Diagnosis not present

## 2019-11-23 DIAGNOSIS — Z681 Body mass index (BMI) 19 or less, adult: Secondary | ICD-10-CM | POA: Diagnosis not present

## 2019-11-23 DIAGNOSIS — F0390 Unspecified dementia without behavioral disturbance: Secondary | ICD-10-CM | POA: Diagnosis not present

## 2019-11-23 DIAGNOSIS — S72002D Fracture of unspecified part of neck of left femur, subsequent encounter for closed fracture with routine healing: Secondary | ICD-10-CM | POA: Diagnosis not present

## 2019-11-23 DIAGNOSIS — E039 Hypothyroidism, unspecified: Secondary | ICD-10-CM | POA: Diagnosis not present

## 2019-11-23 DIAGNOSIS — L89152 Pressure ulcer of sacral region, stage 2: Secondary | ICD-10-CM | POA: Diagnosis not present

## 2019-11-23 DIAGNOSIS — I110 Hypertensive heart disease with heart failure: Secondary | ICD-10-CM | POA: Diagnosis not present

## 2019-11-23 DIAGNOSIS — I472 Ventricular tachycardia: Secondary | ICD-10-CM | POA: Diagnosis not present

## 2019-11-23 LAB — BASIC METABOLIC PANEL
Anion gap: 10 (ref 5–15)
BUN: 16 mg/dL (ref 8–23)
CO2: 26 mmol/L (ref 22–32)
Calcium: 8.5 mg/dL — ABNORMAL LOW (ref 8.9–10.3)
Chloride: 105 mmol/L (ref 98–111)
Creatinine, Ser: 0.84 mg/dL (ref 0.44–1.00)
GFR calc Af Amer: >60 mL/min (ref >60)
GFR calc non Af Amer: >60 mL/min (ref >60)
Glucose, Bld: 91 mg/dL (ref 70–99)
Potassium: 3.9 mmol/L (ref 3.5–5.1)
Sodium: 141 mmol/L (ref 135–145)

## 2019-11-23 LAB — CBC
HCT: 35.7 % — ABNORMAL LOW (ref 36.0–46.0)
Hemoglobin: 11.9 g/dL — ABNORMAL LOW (ref 12.0–15.0)
MCH: 31.6 pg (ref 26.0–34.0)
MCHC: 33.3 g/dL (ref 30.0–36.0)
MCV: 94.7 fL (ref 80.0–100.0)
Platelets: 173 10*3/uL (ref 150–400)
RBC: 3.77 MIL/uL — ABNORMAL LOW (ref 3.87–5.11)
RDW: 12.7 % (ref 11.5–15.5)
WBC: 6.4 10*3/uL (ref 4.0–10.5)
nRBC: 0 % (ref 0.0–0.2)

## 2019-11-23 NOTE — TOC Transition Note (Addendum)
Transition of Care [facility]) - CM/SW Discharge Note   Patient Details  Name: BERLIN VIERECK MRN: [id number] Date of Birth: 07/12/1941  [facility] CM/SW Contact:  Truddie Hidden, LCSW Phone Number: 11/23/2019, 9:39 AM   Clinical Narrative:     Discharged back to [facility]. Return coordinated with Carollee Herter. CSW spoke with patient's daughter Dondra Prader [phone]. and she is agreeable to this plan. Number to call report [phone] given to unit RN Sophie. No other needs at this time. Case closed to this CSW.   Final next level of care: Skilled Nursing Facility Barriers to Discharge: Barriers Resolved   Patient Goals and CMS Choice Patient states their goals for this hospitalization and ongoing recovery are:: unable to assess; altered      Discharge Placement   Existing PASRR number confirmed : 11/23/19          Patient chooses bed at: [facility] Patient to be transferred to facility by: PTAR Name of family member notified: Dondra Prader Patient and family notified of of transfer: 11/23/19  Discharge Plan and Services In-house Referral: Clinical Social Work                                   Social Determinants of Health (SDOH) Interventions     Readmission Risk Interventions Readmission Risk Prevention Plan 05/22/2019  Transportation Screening Complete  Home Care Screening Complete  Medication Review (RN CM) Complete  Some recent data might be hidden

## 2019-11-23 NOTE — Plan of Care (Addendum)
Pt discharging to [facility]. Report given to Shellia Cleverly, LPN at [phone]. Discharge instructions provided to PTAR service to give to SNF facility. Pt AxO x1 to self. Packed all personal belongings. No further questions or concerns voiced. Transportation has arrived and picked up the pt.   Problem: Education: Goal: Knowledge of General Education information will improve Description: Including pain rating scale, medication(s)/side effects and non-pharmacologic comfort measures Outcome: Completed/Met   Problem: Health Behavior/Discharge Planning: Goal: Ability to manage health-related needs will improve Outcome: Completed/Met   Problem: Clinical Measurements: Goal: Ability to maintain clinical measurements within normal limits will improve Outcome: Completed/Met Goal: Will remain free from infection Outcome: Completed/Met Goal: Diagnostic test results will improve Outcome: Completed/Met Goal: Respiratory complications will improve Outcome: Completed/Met Goal: Cardiovascular complication will be avoided Outcome: Completed/Met   Problem: Activity: Goal: Risk for activity intolerance will decrease Outcome: Completed/Met   Problem: Nutrition: Goal: Adequate nutrition will be maintained Outcome: Completed/Met   Problem: Coping: Goal: Level of anxiety will decrease Outcome: Completed/Met   Problem: Elimination: Goal: Will not experience complications related to bowel motility Outcome: Completed/Met Goal: Will not experience complications related to urinary retention Outcome: Completed/Met   Problem: Pain Managment: Goal: General experience of comfort will improve Outcome: Completed/Met   Problem: Safety: Goal: Ability to remain free from injury will improve Outcome: Completed/Met   Problem: Skin Integrity: Goal: Risk for impaired skin integrity will decrease Outcome: Completed/Met   Problem: Education: Goal: Verbalization of understanding the information provided  (i.e., activity precautions, restrictions, etc) will improve Outcome: Completed/Met Goal: Individualized Educational Video(s) Outcome: Completed/Met   Problem: Activity: Goal: Ability to ambulate and perform ADLs will improve Outcome: Completed/Met   Problem: Clinical Measurements: Goal: Postoperative complications will be avoided or minimized Outcome: Completed/Met   Problem: Self-Concept: Goal: Ability to maintain and perform role responsibilities to the fullest extent possible will improve Outcome: Completed/Met   Problem: Pain Management: Goal: Pain level will decrease Outcome: Completed/Met

## 2019-11-23 NOTE — Discharge Summary (Signed)
Physician Discharge Summary  Ruth SagesJudy M Davidson WUJ:[id number] DOB: 25-Oct-1941 DOA: 11/17/2019  PCP: Avon GullyFanta, Tesfaye, MD  Admit date: 11/17/2019 Discharge date: 11/23/2019  Admitted From: Home  Disposition: SNF   Recommendations for Outpatient Follow-up and new medication changes:  1. Follow up with Dr. Felecia ShellingFanta in 7 days.  2. Orthopedics in 2 weeks.   Home Health: na   Equipment/Devices: na    Discharge Condition: stable  CODE STATUS: DNR   Diet recommendation:    Brief/Interim Summary:  Per Dr. Kirby CriglerIkramullah 11/22/18.  [name],Hypothyroid Dementia, CAD, CHF, Copd who apparently had unwitnessed fall and complains of right hip pain.  In ED found to have a right femoral neck fracture. [facility] as her EF is <20% and no cardiology coverage was available on the weekend.    She underwent right hemiarthoplasty on 11/19/19 which she tolerated well. She has been on Lovenox for DVT prophylaxis. We held her Lasix in the hospital due to euvolemia, but she can restart Lasix at discharge.  Discharge details, plan of care and follow up instructions were discussed with patient and any available family or care providers. Patient and family are in agreement with discharge from the hospital today and all questions were answered to their satisfaction.  Procedures:  R hemi-arthoplastry 11/19/19   Consultations:  Orthopedics   Discharge Instructions You were cared for by a hospitalist during your hospital stay. If you have any questions about your discharge medications or the care you received while you were in the hospital after you are discharged, you can call the unit and asked to speak with the hospitalist on call if the hospitalist that took care of you is not available. Once you are discharged, your primary care physician will handle any further medical issues. Please note that NO REFILLS for any  discharge medications will be authorized once you are discharged, as it is imperative that you return to your primary care physician (or establish a relationship with a primary care physician if you do not have one) for your aftercare needs so that they can reassess your need for medications and monitor your lab values.  Weightbearing:WBATRLE. Posterior Hip Precautions. Insicional and dressing care:Reapplynewsterile dressing to operative wound Showering: Keep dressing dry VTE prophylaxis:Lovenox,SCDs, ambulation Pain control: Minimize narcotics. Continue currentPRN medications Follow - up plan: 2 weeks  Discharge Diagnoses:  Principal Problem:   Fracture of femoral neck, right (HCC) Active Problems:   CAD S/P remote PCI   Dementia (HCC)   Chronic combined systolic and diastolic CHF (congestive heart failure) (HCC)   Chronic systolic CHF (congestive heart failure) (HCC)    Discharge Instructions  Discharge Instructions    Diet - low sodium heart healthy   Complete by: As directed    Increase activity slowly   Complete by: As directed      Allergies as of 11/23/2019   No Known Allergies     Medication List    STOP taking these medications   simvastatin 80 MG tablet Commonly known as: ZOCOR Replaced by: atorvastatin 40 MG tablet     TAKE these medications   aspirin EC 81 MG tablet Take 81 mg by mouth daily.   atorvastatin 40 MG tablet Commonly known as: LIPITOR Take 1 tablet (40 mg total) by mouth daily at 6 PM. Replaces: simvastatin 80 MG tablet   bisacodyl 10 MG suppository Commonly known as: DULCOLAX Place 1 suppository (10 mg total) rectally  daily as needed for moderate constipation.   carvedilol 3.125 MG tablet Commonly known as: COREG Take 1 tablet (3.125 mg total) by mouth 2 (two) times daily with a meal. What changed:   how much to take  how to take this  when to take this  additional instructions   Cholecalciferol 50 MCG (2000 UT)  Caps Take 1 capsule by mouth daily.   docusate sodium 100 MG capsule Commonly known as: COLACE Take 1 capsule (100 mg total) by mouth 2 (two) times daily.   enoxaparin 30 MG/0.3ML injection Commonly known as: Lovenox Inject 0.3 mLs (30 mg total) into the skin daily.   feeding supplement (ENSURE ENLIVE) Liqd Take 237 mLs by mouth 2 (two) times daily between meals.   furosemide 20 MG tablet Commonly known as: LASIX Take 1 tablet (20 mg total) by mouth every other day.   HYDROcodone-acetaminophen 5-325 MG tablet Commonly known as: Norco Take 1 tablet by mouth every 6 (six) hours as needed for severe pain.   Melatonin 3 MG Tabs Take 1 tablet by mouth at bedtime.   multivitamin with minerals Tabs tablet Take 1 tablet by mouth daily.   nitroGLYCERIN 0.4 MG SL tablet Commonly known as: NITROSTAT Place 1 tablet (0.4 mg total) under the tongue every 5 (five) minutes as needed for chest pain (or back pain or arm pain).   potassium chloride 10 MEQ tablet Commonly known as: KLOR-CON Take 1 tablet (10 mEq total) by mouth every other day. What changed: when to take this   QUEtiapine 25 MG tablet Commonly known as: SEROQUEL Take 1 tablet (25 mg total) by mouth at bedtime for 14 days.       Contact information for follow-up providers    Teryl Lucy, MD In 2 weeks.   Specialty: Orthopedic Surgery Contact information: [address] [place] [address] [phone]            Contact information for after-discharge care    Destination    [facility] .   Service: Skilled Nursing Contact information: [address] [place] [address] [phone]                 No Known Allergies  Consultations:  Orthopedics.    Procedures/Studies: DG Chest 1 View  Result Date: 11/17/2019 CLINICAL DATA:  Recent right hip fracture EXAM: CHEST  1 VIEW COMPARISON:  05/06/2019 FINDINGS: Cardiac shadow is stable and accentuated  by the frontal technique. The lungs are clear. No acute bony abnormality is seen. IMPRESSION: No acute abnormality noted. Electronically Signed   By: Alcide Clever M.D.   On: 11/17/2019 23:32   Pelvis Portable  Result Date: 11/19/2019 CLINICAL DATA:  Postop, PACU EXAM: PORTABLE PELVIS 1-2 VIEWS COMPARISON:  Radiograph 11/17/2019 FINDINGS: Patient is post right hip hemiarthroplasty with expected postoperative soft tissue changes including subcutaneous and intra-articular gas of the right hip. Prosthesis is in normal, expected alignment without periprosthetic fracture or other acute complication. Mild degenerative changes. Remaining bones of the pelvis are intact. IMPRESSION: Status post right hip hemiarthroplasty with expected postoperative soft tissue changes. No periprosthetic fracture or other acute complication. Electronically Signed   By: Kreg Shropshire M.D.   On: 11/19/2019 17:33   DG Hip Unilat  With Pelvis 2-3 Views Right  Result Date: 11/17/2019 CLINICAL DATA:  Unwitnessed fall. EXAM: DG HIP (WITH OR WITHOUT PELVIS) 2-3V RIGHT COMPARISON:  None. FINDINGS: There is a right femoral neck fracture with varus angulation. SI  joints are symmetric. No subluxation or dislocation. IMPRESSION: Right femoral neck fracture with varus angulation. Electronically Signed   By: Rolm Baptise M.D.   On: 11/17/2019 23:27   ECHOCARDIOGRAM LIMITED  Result Date: 11/18/2019   ECHOCARDIOGRAM LIMITED REPORT   Patient Name:   Ruth Davidson Date of Exam: 11/18/2019 Medical Rec #:  [id number]      Height:       63.0 in Accession #:    [id number]     Weight:       89.9 lb Date of Birth:  12-03-1940      BSA:          1.38 m Patient Age:    64 years       BP:           100/66 mmHg Patient Gender: F              HR:           111 bpm. Exam Location:  [facility]  Procedure: Limited Echo and Color Doppler Indications:    Abnormal EKG  History:        Patient has prior history of Echocardiogram examinations, most                 recent  06/22/2017. Right hip fracture.  Sonographer:    Dustin Flock RDCS Referring Phys: [phone] [facility]  Sonographer Comments: Image acquisition challenging due to uncooperative patient and pain in R hip. IMPRESSIONS  1. Left ventricular ejection fraction, by visual estimation, is <20%. The left ventricle has severely decreased function. There is no increased left ventricular wall thickness.  2. Left ventricular diastolic parameters are consistent with Grade I diastolic dysfunction (impaired relaxation).  3. The left ventricle demonstrates global hypokinesis.  4. Mild to moderate mitral annular calcification.  5. The mitral valve is grossly normal. Trivial mitral valve regurgitation.  6. No significant change from prior study. FINDINGS  Left Ventricle: Left ventricular ejection fraction, by visual estimation, is <20%. The left ventricle has severely decreased function. The left ventricle demonstrates global hypokinesis. There is no increased left ventricular wall thickness. Left ventricular diastolic parameters are consistent with Grade I diastolic dysfunction (impaired relaxation). Indeterminate filling pressures. Mitral Valve: The mitral valve is grossly normal. Mild to moderate mitral annular calcification. Trivial mitral valve regurgitation.  LEFT VENTRICLE          Normals PLAX 2D LVIDd:         4.19 cm  3.6 cm   Diastology               Normals LVIDs:         3.86 cm  1.7 cm   LV e' lateral: 4.03 cm/s 6.42 cm/s LV PW:         1.04 cm  1.4 cm   LV e' medial:  2.39 cm/s 6.96 cm/s LV IVS:        0.89 cm  1.3 cm LVOT diam:     1.80 cm  2.0 cm LV SV:         14 ml    79 ml LV SV Index:   10.37    45 ml/m2 LVOT Area:     2.54 cm 3.14 cm2  LEFT ATRIUM         Index LA diam:    3.20 cm 2.32 cm/m   AORTA                 Normals  Ao Root diam: 2.40 cm 31 mm  SHUNTS Systemic Diam: 1.80 cm  Zoila Shutter MD Electronically signed by Zoila Shutter MD Signature Date/Time: 11/18/2019/3:52:27 PMThe mitral valve is  grossly normal.    Final       Procedures: right hip arthroplasty.   Subjective: Patient is feeling well, denies any pain, or dyspnea. Positive confusion this am but no agitation.   Discharge Exam: Vitals:   11/23/19 0300 11/23/19 0746  BP: 109/72 (!) 109/57  Pulse: 84 77  Resp: 16 18  Temp: 98.6 F (37 C) 98.7 F (37.1 C)  SpO2:  92%   Vitals:   11/22/19 1444 11/22/19 1937 11/23/19 0300 11/23/19 0746  BP: (!) 113/56 95/79 109/72 (!) 109/57  Pulse: 84 82 84 77  Resp: 16 19 16 18   Temp: 97.7 F (36.5 C) 98.4 F (36.9 C) 98.6 F (37 C) 98.7 F (37.1 C)  TempSrc: Oral   Oral  SpO2: 93%   92%  Weight:      Height:        General: Not in pain or dyspnea.  Neurology: Awake and alert, non focal  E ENT: no pallor, no icterus, oral mucosa moist Cardiovascular: No JVD. S1-S2 present, rhythmic, no gallops, rubs, or murmurs. No lower extremity edema. Pulmonary: positive breath sounds bilaterally, adequate air movement, no wheezing, rhonchi or rales. Gastrointestinal. Abdomen no organomegaly, non tender, no rebound or guarding Skin. No rashes Musculoskeletal: no joint deformities   The results of significant diagnostics from this hospitalization (including imaging, microbiology, ancillary and laboratory) are listed below for reference.     Microbiology: Recent Results (from the past 240 hour(s))  SARS CORONAVIRUS 2 (TAT 6-24 HRS) Nasopharyngeal Nasopharyngeal Swab     Status: None   Collection Time: 11/18/19  3:55 AM   Specimen: Nasopharyngeal Swab  Result Value Ref Range Status   SARS Coronavirus 2 NEGATIVE NEGATIVE Final    Comment: (NOTE) SARS-CoV-2 target nucleic acids are NOT DETECTED. The SARS-CoV-2 RNA is generally detectable in upper and lower respiratory specimens during the acute phase of infection. Negative results do not preclude SARS-CoV-2 infection, do not rule out co-infections with other pathogens, and should not be used as the sole basis for  treatment or other patient management decisions. Negative results must be combined with clinical observations, patient history, and epidemiological information. The expected result is Negative. Fact Sheet for Patients: 11/20/19 Fact Sheet for Healthcare Providers: HairSlick.no This test is not yet approved or cleared by the [place] FDA and  has been authorized for detection and/or diagnosis of SARS-CoV-2 by FDA under an Emergency Use Authorization (EUA). This EUA will remain  in effect (meaning this test can be used) for the duration of the COVID-19 declaration under Section 56 4(b)(1) of the Act, 21 U.S.C. section 360bbb-3(b)(1), unless the authorization is terminated or revoked sooner. Performed at [facility], [address], [place], [place]   Surgical pcr screen     Status: None   Collection Time: 11/19/19 12:35 AM   Specimen: Nasal Mucosa; Nasal Swab  Result Value Ref Range Status   MRSA, PCR NEGATIVE NEGATIVE Final   Staphylococcus aureus NEGATIVE NEGATIVE Final    Comment: (NOTE) The Xpert SA Assay (FDA approved for NASAL specimens in patients 1 years of age and older), is one component of a comprehensive surveillance program. It is not intended to diagnose infection nor to guide or monitor treatment. Performed at [facility], [address] [place],  [place] [address]   SARS CORONAVIRUS 2 (TAT 6-24 HRS) Nasopharyngeal Nasopharyngeal Swab     Status: None   Collection Time: 11/21/19 11:35 PM   Specimen: Nasopharyngeal Swab  Result Value Ref Range Status   SARS Coronavirus 2 NEGATIVE NEGATIVE Final    Comment: (NOTE) SARS-CoV-2 target nucleic acids are NOT DETECTED. The SARS-CoV-2 RNA is generally detectable in upper and lower respiratory specimens during the acute phase of infection. Negative results do not preclude SARS-CoV-2 infection, do not rule out co-infections with  other pathogens, and should not be used as the sole basis for treatment or other patient management decisions. Negative results must be combined with clinical observations, patient history, and epidemiological information. The expected result is Negative. Fact Sheet for Patients: HairSlick.no Fact Sheet for Healthcare Providers: quierodirigir.com This test is not yet approved or cleared by the [place] FDA and  has been authorized for detection and/or diagnosis of SARS-CoV-2 by FDA under an Emergency Use Authorization (EUA). This EUA will remain  in effect (meaning this test can be used) for the duration of the COVID-19 declaration under Section 56 4(b)(1) of the Act, 21 U.S.C. section 360bbb-3(b)(1), unless the authorization is terminated or revoked sooner. Performed at [facility], [address] [place], [place] [address]      Labs: BNP (last 3 results) Recent Labs    05/16/19 2232  BNP 313.0*   Basic Metabolic Panel: Recent Labs  Lab 11/17/19 2330 11/18/19 1029 11/20/19 0449 11/22/19 0231 11/23/19 0211  NA 140 140 142 138 141  K 3.6 4.2 4.6 3.9 3.9  CL 108 107 107 103 105  CO2 24 26 27 26 26   GLUCOSE 138* 102* 96 89 91  BUN 27* 21 21 18 16   CREATININE 1.25* 1.05* 0.96 0.89 0.84  CALCIUM 7.7* 8.1* 8.4* 8.1* 8.5*   Liver Function Tests: Recent Labs  Lab 11/18/19 1029  AST 46*  ALT 34  ALKPHOS 74  BILITOT 0.7  PROT 5.6*  ALBUMIN 3.1*   No results for input(s): LIPASE, AMYLASE in the last 168 hours. No results for input(s): AMMONIA in the last 168 hours. CBC: Recent Labs  Lab 11/17/19 2330 11/17/19 2330 11/18/19 1029 11/20/19 0449 11/21/19 0948 11/22/19 0231 11/23/19 0211  WBC 10.4   < > 9.9 7.2 8.0 6.4 6.4  NEUTROABS 8.8*  --   --   --   --   --   --   HGB 14.2   < > 14.0 11.9* 12.6 11.7* 11.9*  HCT 46.0   < > 44.6 37.8 38.9 36.6 35.7*  MCV 103.4*   < > 102.3* 100.0 97.7 97.9  94.7  PLT 125*   < > 118* 99* 127* 136* 173   < > = values in this interval not displayed.   Cardiac Enzymes: No results for input(s): CKTOTAL, CKMB, CKMBINDEX, TROPONINI in the last 168 hours. BNP: Invalid input(s): POCBNP CBG: No results for input(s): GLUCAP in the last 168 hours. D-Dimer No results for input(s): DDIMER in the last 72 hours. Hgb A1c No results for input(s): HGBA1C in the last 72 hours. Lipid Profile No results for input(s): CHOL, HDL, LDLCALC, TRIG, CHOLHDL, LDLDIRECT in the last 72 hours. Thyroid function studies No results for input(s): TSH, T4TOTAL, T3FREE, THYROIDAB in the last 72 hours.  Invalid input(s): FREET3 Anemia work up No results for input(s): VITAMINB12, FOLATE, FERRITIN, TIBC, IRON, RETICCTPCT in the last 72 hours. Urinalysis    Component Value Date/Time   COLORURINE YELLOW  11/19/2019 1718   APPEARANCEUR CLEAR 11/19/2019 1718   LABSPEC 1.030 11/19/2019 1718   PHURINE 5.0 11/19/2019 1718   GLUCOSEU NEGATIVE 11/19/2019 1718   HGBUR MODERATE (A) 11/19/2019 1718   BILIRUBINUR NEGATIVE 11/19/2019 1718   KETONESUR 20 (A) 11/19/2019 1718   PROTEINUR NEGATIVE 11/19/2019 1718   NITRITE NEGATIVE 11/19/2019 1718   LEUKOCYTESUR SMALL (A) 11/19/2019 1718   Sepsis Labs Invalid input(s): PROCALCITONIN,  WBC,  LACTICIDVEN Microbiology Recent Results (from the past 240 hour(s))  SARS CORONAVIRUS 2 (TAT 6-24 HRS) Nasopharyngeal Nasopharyngeal Swab     Status: None   Collection Time: 11/18/19  3:55 AM   Specimen: Nasopharyngeal Swab  Result Value Ref Range Status   SARS Coronavirus 2 NEGATIVE NEGATIVE Final    Comment: (NOTE) SARS-CoV-2 target nucleic acids are NOT DETECTED. The SARS-CoV-2 RNA is generally detectable in upper and lower respiratory specimens during the acute phase of infection. Negative results do not preclude SARS-CoV-2 infection, do not rule out co-infections with other pathogens, and should not be used as the sole basis for  treatment or other patient management decisions. Negative results must be combined with clinical observations, patient history, and epidemiological information. The expected result is Negative. Fact Sheet for Patients: HairSlick.nohttps://www.fda.gov/media/138098/download Fact Sheet for Healthcare Providers: quierodirigir.comhttps://www.fda.gov/media/138095/download This test is not yet approved or cleared by the [place] FDA and  has been authorized for detection and/or diagnosis of SARS-CoV-2 by FDA under an Emergency Use Authorization (EUA). This EUA will remain  in effect (meaning this test can be used) for the duration of the COVID-19 declaration under Section 56 4(b)(1) of the Act, 21 U.S.C. section 360bbb-3(b)(1), unless the authorization is terminated or revoked sooner. Performed at [facility], [address] [place], [place] [address]   Surgical pcr screen     Status: None   Collection Time: 11/19/19 12:35 AM   Specimen: Nasal Mucosa; Nasal Swab  Result Value Ref Range Status   MRSA, PCR NEGATIVE NEGATIVE Final   Staphylococcus aureus NEGATIVE NEGATIVE Final    Comment: (NOTE) The Xpert SA Assay (FDA approved for NASAL specimens in patients 78 years of age and older), is one component of a comprehensive surveillance program. It is not intended to diagnose infection nor to guide or monitor treatment. Performed at [facility], [address] [place], [place] [id number]   SARS CORONAVIRUS 2 (TAT 6-24 HRS) Nasopharyngeal Nasopharyngeal Swab     Status: None   Collection Time: 11/21/19 11:35 PM   Specimen: Nasopharyngeal Swab  Result Value Ref Range Status   SARS Coronavirus 2 NEGATIVE NEGATIVE Final    Comment: (NOTE) SARS-CoV-2 target nucleic acids are NOT DETECTED. The SARS-CoV-2 RNA is generally detectable in upper and lower respiratory specimens during the acute phase of infection. Negative results do not preclude SARS-CoV-2 infection, do not rule out co-infections with  other pathogens, and should not be used as the sole basis for treatment or other patient management decisions. Negative results must be combined with clinical observations, patient history, and epidemiological information. The expected result is Negative. Fact Sheet for Patients: HairSlick.nohttps://www.fda.gov/media/138098/download Fact Sheet for Healthcare Providers: quierodirigir.comhttps://www.fda.gov/media/138095/download This test is not yet approved or cleared by the [place] FDA and  has been authorized for detection and/or diagnosis of SARS-CoV-2 by FDA under an Emergency Use Authorization (EUA). This EUA will remain  in effect (meaning this test can be used) for the duration of the COVID-19 declaration under Section 56 4(b)(1) of the Act, 21 U.S.C. section 360bbb-3(b)(1),  unless the authorization is terminated or revoked sooner. Performed at [facility], [address] [place], [place] [address]      Time coordinating discharge: 45 minutes  SIGNED:   Coralie Keens, MD  [facility] 11/23/2019, 9:30 AM

## 2019-11-23 NOTE — Progress Notes (Signed)
     Subjective:  Patient resting comfortably in bed. She is minimally interactive due to dementia however she denies pain and any numbness this morning and is able to follow commands.   Objective:   VITALS:   Vitals:   11/22/19 0759 11/22/19 1444 11/22/19 1937 11/23/19 0300  BP: 108/69 (!) 113/56 95/79 109/72  Pulse: 85 84 82 84  Resp: 15 16 19 16   Temp: 97.7 F (36.5 C) 97.7 F (36.5 C) 98.4 F (36.9 C) 98.6 F (37 C)  TempSrc: Oral Oral    SpO2: 100% 93%    Weight:      Height:       Physical Exam: General: In no acute distress. Sleeping comfortably in bed.   Resp: No use of accessory musculature.  GI: abdomen soft MSK: Patient able to move all toes of right foot and responded yes when questioned if she could feel palpation to right foot and toes. Pulses intact distally. Steristrips over operative incision intact.  Lab Results  Component Value Date   WBC 6.4 11/23/2019   HGB 11.9 (L) 11/23/2019   HCT 35.7 (L) 11/23/2019   MCV 94.7 11/23/2019   PLT 173 11/23/2019   BMET    Component Value Date/Time   NA 141 11/23/2019 0211   K 3.9 11/23/2019 0211   CL 105 11/23/2019 0211   CO2 26 11/23/2019 0211   GLUCOSE 91 11/23/2019 0211   BUN 16 11/23/2019 0211   CREATININE 0.84 11/23/2019 0211   CREATININE 1.10 (H) 08/20/2017 1247   CALCIUM 8.5 (L) 11/23/2019 0211   GFRNONAA >60 11/23/2019 0211   GFRAA >60 11/23/2019 0211     Assessment/Plan: 4 Days Post-Op   Principal Problem:   Fracture of femoral neck, right (HCC) Active Problems:   CAD S/P remote PCI   Dementia (HCC)   Chronic combined systolic and diastolic CHF (congestive heart failure) (HCC)   Chronic systolic CHF (congestive heart failure) (HCC)   Closedrightfemoral neckfracture, status post Hemiarthroplasty Postop day4 Continue PRN pain control  Plan: Up with therapy Apply ice PRN  Weightbearing:WBATRLE. Posterior Hip Precautions. Insicional and dressing care:Spoke with nurse  regarding sterile dressings, patient has been pulling them off however steristrips have remained intact. Nurse agreed to reapply sterile dressing prior to discharge.  Showering: Keep dressing dry VTE prophylaxis:Lovenox,SCDs, ambulation Pain control: Minimize narcotics. Continue currentPRN medications Follow - up plan: 2 weeks  Dispo:OK for discharge from orthopedic standpoint. Therapy recommending SNF at discharge. According to [facility] team, able to return to [facility] care where she resides today. COVID test 11/20/18 resulted negative.    11/22/18 11/23/2019, 7:22 AM   11/25/2019, MD Cell [phone]

## 2019-11-30 ENCOUNTER — Other Ambulatory Visit: Payer: Self-pay

## 2019-11-30 ENCOUNTER — Encounter (HOSPITAL_COMMUNITY): Payer: Self-pay | Admitting: Emergency Medicine

## 2019-11-30 ENCOUNTER — Emergency Department (HOSPITAL_COMMUNITY): Payer: Medicare Other

## 2019-11-30 ENCOUNTER — Inpatient Hospital Stay (HOSPITAL_COMMUNITY)
Admission: EM | Admit: 2019-11-30 | Discharge: 2019-12-06 | DRG: 522 | Disposition: A | Discharge status: Skilled Nursing Facility | Payer: Medicare Other | Attending: Family Medicine | Admitting: Family Medicine

## 2019-11-30 DIAGNOSIS — I2510 Atherosclerotic heart disease of native coronary artery without angina pectoris: Secondary | ICD-10-CM | POA: Diagnosis not present

## 2019-11-30 DIAGNOSIS — J449 Chronic obstructive pulmonary disease, unspecified: Secondary | ICD-10-CM | POA: Diagnosis not present

## 2019-11-30 DIAGNOSIS — Z7401 Bed confinement status: Secondary | ICD-10-CM | POA: Diagnosis not present

## 2019-11-30 DIAGNOSIS — M2550 Pain in unspecified joint: Secondary | ICD-10-CM | POA: Diagnosis not present

## 2019-11-30 DIAGNOSIS — G4700 Insomnia, unspecified: Secondary | ICD-10-CM | POA: Diagnosis not present

## 2019-11-30 DIAGNOSIS — E860 Dehydration: Secondary | ICD-10-CM | POA: Diagnosis present

## 2019-11-30 DIAGNOSIS — Z9119 Patient's noncompliance with other medical treatment and regimen: Secondary | ICD-10-CM

## 2019-11-30 DIAGNOSIS — I110 Hypertensive heart disease with heart failure: Secondary | ICD-10-CM | POA: Diagnosis not present

## 2019-11-30 DIAGNOSIS — I472 Ventricular tachycardia: Secondary | ICD-10-CM | POA: Diagnosis not present

## 2019-11-30 DIAGNOSIS — W19XXXA Unspecified fall, initial encounter: Secondary | ICD-10-CM | POA: Diagnosis present

## 2019-11-30 DIAGNOSIS — I5042 Chronic combined systolic (congestive) and diastolic (congestive) heart failure: Secondary | ICD-10-CM | POA: Diagnosis not present

## 2019-11-30 DIAGNOSIS — E890 Postprocedural hypothyroidism: Secondary | ICD-10-CM | POA: Diagnosis present

## 2019-11-30 DIAGNOSIS — F0391 Unspecified dementia with behavioral disturbance: Secondary | ICD-10-CM | POA: Diagnosis not present

## 2019-11-30 DIAGNOSIS — E785 Hyperlipidemia, unspecified: Secondary | ICD-10-CM | POA: Diagnosis not present

## 2019-11-30 DIAGNOSIS — R52 Pain, unspecified: Secondary | ICD-10-CM

## 2019-11-30 DIAGNOSIS — E876 Hypokalemia: Secondary | ICD-10-CM | POA: Diagnosis present

## 2019-11-30 DIAGNOSIS — I471 Supraventricular tachycardia: Secondary | ICD-10-CM | POA: Diagnosis not present

## 2019-11-30 DIAGNOSIS — I959 Hypotension, unspecified: Secondary | ICD-10-CM | POA: Diagnosis not present

## 2019-11-30 DIAGNOSIS — D62 Acute posthemorrhagic anemia: Secondary | ICD-10-CM | POA: Diagnosis not present

## 2019-11-30 DIAGNOSIS — R000 Tachycardia, unspecified: Secondary | ICD-10-CM | POA: Diagnosis not present

## 2019-11-30 DIAGNOSIS — Z803 Family history of malignant neoplasm of breast: Secondary | ICD-10-CM

## 2019-11-30 DIAGNOSIS — Z96641 Presence of right artificial hip joint: Secondary | ICD-10-CM | POA: Diagnosis present

## 2019-11-30 DIAGNOSIS — R0902 Hypoxemia: Secondary | ICD-10-CM | POA: Diagnosis not present

## 2019-11-30 DIAGNOSIS — Z66 Do not resuscitate: Secondary | ICD-10-CM | POA: Diagnosis present

## 2019-11-30 DIAGNOSIS — Z681 Body mass index (BMI) 19 or less, adult: Secondary | ICD-10-CM | POA: Diagnosis not present

## 2019-11-30 DIAGNOSIS — Z79899 Other long term (current) drug therapy: Secondary | ICD-10-CM

## 2019-11-30 DIAGNOSIS — F0390 Unspecified dementia without behavioral disturbance: Secondary | ICD-10-CM | POA: Diagnosis not present

## 2019-11-30 DIAGNOSIS — M80052A Age-related osteoporosis with current pathological fracture, left femur, initial encounter for fracture: Secondary | ICD-10-CM | POA: Diagnosis present

## 2019-11-30 DIAGNOSIS — S2232XA Fracture of one rib, left side, initial encounter for closed fracture: Secondary | ICD-10-CM | POA: Diagnosis present

## 2019-11-30 DIAGNOSIS — Z20822 Contact with and (suspected) exposure to covid-19: Secondary | ICD-10-CM | POA: Diagnosis not present

## 2019-11-30 DIAGNOSIS — F05 Delirium due to known physiological condition: Secondary | ICD-10-CM | POA: Diagnosis not present

## 2019-11-30 DIAGNOSIS — Z8249 Family history of ischemic heart disease and other diseases of the circulatory system: Secondary | ICD-10-CM

## 2019-11-30 DIAGNOSIS — E440 Moderate protein-calorie malnutrition: Secondary | ICD-10-CM | POA: Diagnosis not present

## 2019-11-30 DIAGNOSIS — I252 Old myocardial infarction: Secondary | ICD-10-CM

## 2019-11-30 DIAGNOSIS — Z471 Aftercare following joint replacement surgery: Secondary | ICD-10-CM | POA: Diagnosis not present

## 2019-11-30 DIAGNOSIS — Z9861 Coronary angioplasty status: Secondary | ICD-10-CM | POA: Diagnosis not present

## 2019-11-30 DIAGNOSIS — Z8781 Personal history of (healed) traumatic fracture: Secondary | ICD-10-CM

## 2019-11-30 DIAGNOSIS — S72042D Displaced fracture of base of neck of left femur, subsequent encounter for closed fracture with routine healing: Secondary | ICD-10-CM | POA: Diagnosis not present

## 2019-11-30 DIAGNOSIS — Z96642 Presence of left artificial hip joint: Secondary | ICD-10-CM | POA: Diagnosis not present

## 2019-11-30 DIAGNOSIS — F17210 Nicotine dependence, cigarettes, uncomplicated: Secondary | ICD-10-CM | POA: Diagnosis present

## 2019-11-30 DIAGNOSIS — I4729 Other ventricular tachycardia: Secondary | ICD-10-CM

## 2019-11-30 DIAGNOSIS — I420 Dilated cardiomyopathy: Secondary | ICD-10-CM | POA: Diagnosis not present

## 2019-11-30 DIAGNOSIS — R410 Disorientation, unspecified: Secondary | ICD-10-CM | POA: Diagnosis not present

## 2019-11-30 DIAGNOSIS — S72002A Fracture of unspecified part of neck of left femur, initial encounter for closed fracture: Principal | ICD-10-CM | POA: Diagnosis present

## 2019-11-30 DIAGNOSIS — R451 Restlessness and agitation: Secondary | ICD-10-CM | POA: Diagnosis present

## 2019-11-30 DIAGNOSIS — Z7982 Long term (current) use of aspirin: Secondary | ICD-10-CM

## 2019-11-30 DIAGNOSIS — S72001D Fracture of unspecified part of neck of right femur, subsequent encounter for closed fracture with routine healing: Secondary | ICD-10-CM | POA: Diagnosis not present

## 2019-11-30 DIAGNOSIS — Z9049 Acquired absence of other specified parts of digestive tract: Secondary | ICD-10-CM

## 2019-11-30 DIAGNOSIS — S72092A Other fracture of head and neck of left femur, initial encounter for closed fracture: Secondary | ICD-10-CM | POA: Diagnosis not present

## 2019-11-30 DIAGNOSIS — Z9889 Other specified postprocedural states: Secondary | ICD-10-CM

## 2019-11-30 DIAGNOSIS — S72002D Fracture of unspecified part of neck of left femur, subsequent encounter for closed fracture with routine healing: Secondary | ICD-10-CM | POA: Diagnosis not present

## 2019-11-30 DIAGNOSIS — E43 Unspecified severe protein-calorie malnutrition: Secondary | ICD-10-CM | POA: Diagnosis not present

## 2019-11-30 DIAGNOSIS — Z955 Presence of coronary angioplasty implant and graft: Secondary | ICD-10-CM

## 2019-11-30 DIAGNOSIS — Z03818 Encounter for observation for suspected exposure to other biological agents ruled out: Secondary | ICD-10-CM | POA: Diagnosis not present

## 2019-11-30 DIAGNOSIS — R404 Transient alteration of awareness: Secondary | ICD-10-CM | POA: Diagnosis not present

## 2019-11-30 DIAGNOSIS — E559 Vitamin D deficiency, unspecified: Secondary | ICD-10-CM | POA: Diagnosis not present

## 2019-11-30 DIAGNOSIS — S72009A Fracture of unspecified part of neck of unspecified femur, initial encounter for closed fracture: Secondary | ICD-10-CM | POA: Diagnosis present

## 2019-11-30 DIAGNOSIS — I5023 Acute on chronic systolic (congestive) heart failure: Secondary | ICD-10-CM | POA: Diagnosis not present

## 2019-11-30 LAB — CBC WITH DIFFERENTIAL/PLATELET
Abs Immature Granulocytes: 0.08 10*3/uL — ABNORMAL HIGH (ref 0.00–0.07)
Basophils Absolute: 0 10*3/uL (ref 0.0–0.1)
Basophils Relative: 0 %
Eosinophils Absolute: 0.3 10*3/uL (ref 0.0–0.5)
Eosinophils Relative: 2 %
HCT: 40 % (ref 36.0–46.0)
Hemoglobin: 12.6 g/dL (ref 12.0–15.0)
Immature Granulocytes: 1 %
Lymphocytes Relative: 6 %
Lymphs Abs: 0.8 10*3/uL (ref 0.7–4.0)
MCH: 31.3 pg (ref 26.0–34.0)
MCHC: 31.5 g/dL (ref 30.0–36.0)
MCV: 99.3 fL (ref 80.0–100.0)
Monocytes Absolute: 0.6 10*3/uL (ref 0.1–1.0)
Monocytes Relative: 4 %
Neutro Abs: 12.2 10*3/uL — ABNORMAL HIGH (ref 1.7–7.7)
Neutrophils Relative %: 87 %
Platelets: 294 10*3/uL (ref 150–400)
RBC: 4.03 MIL/uL (ref 3.87–5.11)
RDW: 13.5 % (ref 11.5–15.5)
WBC: 13.9 10*3/uL — ABNORMAL HIGH (ref 4.0–10.5)
nRBC: 0 % (ref 0.0–0.2)

## 2019-11-30 LAB — BASIC METABOLIC PANEL
Anion gap: 8 (ref 5–15)
BUN: 16 mg/dL (ref 8–23)
CO2: 27 mmol/L (ref 22–32)
Calcium: 8.6 mg/dL — ABNORMAL LOW (ref 8.9–10.3)
Chloride: 103 mmol/L (ref 98–111)
Creatinine, Ser: 0.84 mg/dL (ref 0.44–1.00)
GFR calc Af Amer: >60 mL/min (ref >60)
GFR calc non Af Amer: >60 mL/min (ref >60)
Glucose, Bld: 111 mg/dL — ABNORMAL HIGH (ref 70–99)
Potassium: 4 mmol/L (ref 3.5–5.1)
Sodium: 138 mmol/L (ref 135–145)

## 2019-11-30 LAB — PROTIME-INR
INR: 1.1 (ref 0.8–1.2)
Prothrombin Time: 13.8 seconds (ref 11.4–15.2)

## 2019-11-30 LAB — RESPIRATORY PANEL BY RT PCR (FLU A&B, COVID)
Influenza A by PCR: NEGATIVE
Influenza B by PCR: NEGATIVE
SARS Coronavirus 2 by RT PCR: NEGATIVE

## 2019-11-30 LAB — TYPE AND SCREEN
ABO/RH(D): O POS
Antibody Screen: NEGATIVE

## 2019-11-30 MED ORDER — CEFAZOLIN SODIUM-DEXTROSE 2-4 GM/100ML-% IV SOLN
2.0000 g | INTRAVENOUS | Status: AC
  Administered 2019-12-01: 2 g via INTRAVENOUS
  Filled 2019-11-30: qty 100

## 2019-11-30 MED ORDER — MORPHINE SULFATE (PF) 2 MG/ML IV SOLN
1.0000 mg | INTRAVENOUS | Status: DC | PRN
  Administered 2019-11-30: 08:00:00 1 mg via INTRAVENOUS
  Filled 2019-11-30: qty 1

## 2019-11-30 MED ORDER — MORPHINE SULFATE (PF) 2 MG/ML IV SOLN
0.5000 mg | INTRAVENOUS | Status: DC | PRN
  Administered 2019-11-30 – 2019-12-01 (×2): 0.5 mg via INTRAVENOUS
  Filled 2019-11-30 (×3): qty 1

## 2019-11-30 MED ORDER — ENSURE PRE-SURGERY PO LIQD
296.0000 mL | Freq: Once | ORAL | Status: DC
  Filled 2019-11-30: qty 296

## 2019-11-30 MED ORDER — ENOXAPARIN SODIUM 40 MG/0.4ML ~~LOC~~ SOLN
40.0000 mg | SUBCUTANEOUS | Status: DC
  Administered 2019-11-30: 40 mg via SUBCUTANEOUS
  Filled 2019-11-30: qty 0.4

## 2019-11-30 MED ORDER — CHLORHEXIDINE GLUCONATE 4 % EX LIQD
60.0000 mL | Freq: Once | CUTANEOUS | Status: DC
  Filled 2019-11-30: qty 60

## 2019-11-30 MED ORDER — POVIDONE-IODINE 10 % EX SWAB: 2.0000 "application " | Freq: Once | CUTANEOUS | Status: DC

## 2019-11-30 MED ORDER — CEFAZOLIN SODIUM-DEXTROSE 2-4 GM/100ML-% IV SOLN: 2.0000 g | INTRAVENOUS | Status: DC

## 2019-11-30 MED ORDER — HYDROCODONE-ACETAMINOPHEN 5-325 MG PO TABS: 1.0000 | ORAL_TABLET | Freq: Four times a day (QID) | ORAL | Status: DC | PRN

## 2019-11-30 MED ORDER — ASPIRIN EC 81 MG PO TBEC
81.0000 mg | DELAYED_RELEASE_TABLET | Freq: Every day | ORAL | Status: DC
  Administered 2019-11-30 – 2019-12-06 (×4): 81 mg via ORAL
  Filled 2019-11-30 (×6): qty 1

## 2019-11-30 MED ORDER — ADULT MULTIVITAMIN W/MINERALS CH
1.0000 | ORAL_TABLET | Freq: Every day | ORAL | Status: DC
  Administered 2019-11-30 – 2019-12-06 (×3): 1 via ORAL
  Filled 2019-11-30 (×6): qty 1

## 2019-11-30 MED ORDER — ENSURE ENLIVE PO LIQD
237.0000 mL | Freq: Two times a day (BID) | ORAL | Status: DC
  Administered 2019-11-30 – 2019-12-05 (×4): 237 mL via ORAL

## 2019-11-30 MED ORDER — CARVEDILOL 3.125 MG PO TABS
3.1250 mg | ORAL_TABLET | Freq: Two times a day (BID) | ORAL | Status: DC
  Administered 2019-11-30 – 2019-12-06 (×7): 3.125 mg via ORAL
  Filled 2019-11-30 (×14): qty 1

## 2019-11-30 MED ORDER — QUETIAPINE FUMARATE 25 MG PO TABS
25.0000 mg | ORAL_TABLET | Freq: Every day | ORAL | Status: DC
  Administered 2019-11-30 – 2019-12-05 (×6): 25 mg via ORAL
  Filled 2019-11-30 (×6): qty 1

## 2019-11-30 MED ORDER — ATORVASTATIN CALCIUM 40 MG PO TABS
40.0000 mg | ORAL_TABLET | Freq: Every day | ORAL | Status: DC
  Administered 2019-11-30 – 2019-12-05 (×5): 40 mg via ORAL
  Filled 2019-11-30 (×6): qty 1

## 2019-11-30 NOTE — ED Triage Notes (Signed)
Pt had fall at nursing home yesterday at 0630 and now c/o left hip pain. Pt had recent right hip surgery.

## 2019-11-30 NOTE — Plan of Care (Signed)
  Problem: Education: Goal: Knowledge of General Education information will improve Description Including pain rating scale, medication(s)/side effects and non-pharmacologic comfort measures Outcome: Progressing   Problem: Clinical Measurements: Goal: Ability to maintain clinical measurements within normal limits will improve Outcome: Progressing   Problem: Activity: Goal: Risk for activity intolerance will decrease Outcome: Progressing   Problem: Nutrition: Goal: Adequate nutrition will be maintained Outcome: Progressing   Problem: Coping: Goal: Level of anxiety will decrease Outcome: Progressing   Problem: Elimination: Goal: Will not experience complications related to urinary retention Outcome: Progressing   Problem: Pain Managment: Goal: General experience of comfort will improve Outcome: Progressing   Problem: Safety: Goal: Ability to remain free from injury will improve Outcome: Progressing   Problem: Skin Integrity: Goal: Risk for impaired skin integrity will decrease Outcome: Progressing   

## 2019-11-30 NOTE — H&P (Signed)
History and Physical    COURTANY MCMURPHY UKG:[id number] DOB: [birth year] DOA: 11/30/2019  PCP: Bernerd Limbo, MD  Patient coming from: Skilled nursing facility  I have personally briefly reviewed patient's old medical records in [facility]  Chief Complaint: Fall  HPI: Ruth Davidson is a 79 y.o. 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 She returned to the nursing facility. Since her surgery, patient had been ambulating with physical therapy. Patient is confused and unable to provide any meaningful history at this time. She is brought back to the emergency room today with hip pain after having a fall yesterday. Imaging was performed as an outpatient and found to have left-sided hip fracture. She was sent to the emergency room for evaluation. Imaging in the ER confirms left-sided rib fracture. Labs currently in process. Vitals are otherwise stable. She has been referred for admission.  Review of Systems: Unable to assess due to mental status   Past Medical History:  Diagnosis Date  . Cardiac cachexia   . CHF (congestive heart failure) (HCC)   . COPD (chronic obstructive pulmonary disease) (HCC)   . Coronary artery disease   . Dementia (HCC)   . Fracture of femoral neck, right (HCC) 11/18/2019  . MI (myocardial infarction) (HCC)   . Non-compliance   . Thyroid disease   . Tobacco use     Past Surgical History:  Procedure Laterality Date  . CHOLECYSTECTOMY    . CORONARY ANGIOPLASTY WITH STENT PLACEMENT  2003  . HIP ARTHROPLASTY Right 11/19/2019   Procedure: RIGHT HIP HEMI-ARTHROPLASTY;  Surgeon: Teryl Lucy, MD;  Location: [facility] OR;  Service: Orthopedics;  Laterality: Right;  . THYROIDECTOMY     goiter/no cancer, per report    Social History:  reports that she has been  smoking cigarettes. She started smoking about 61 years ago. She has a 50.00 pack-year smoking history. She has never used smokeless tobacco. She reports that she does not drink alcohol or use drugs.  No Known Allergies  Family History  Problem Relation Age of Onset  . Hypertension Father        Died in war  . Diabetes Mother   . Breast cancer Mother   . Pneumonia Sister   . Hypertension Son     Prior to Admission medications   Medication Sig Start Date End Date Taking? Authorizing Provider  aspirin EC 81 MG tablet Take 81 mg by mouth daily.    [provider]  atorvastatin (LIPITOR) 40 MG tablet Take 1 tablet (40 mg total) by mouth daily at 6 PM. 11/21/19 12/21/19  Kirby Crigler, Mir Mohammed, MD  bisacodyl (DULCOLAX) 10 MG suppository Place 1 suppository (10 mg total) rectally daily as needed for moderate constipation. 11/21/19   Kirby Crigler, Mir Shirline Frees, MD  carvedilol (COREG) 3.125 MG tablet Take 1 tablet (3.125 mg total) by mouth 2 (two) times daily with a meal. 11/21/19 12/21/19  Kirby Crigler, Mir Shirline Frees, MD  Cholecalciferol 50 MCG (2000 UT) CAPS Take 1 capsule by mouth daily.    [provider]  docusate sodium (COLACE) 100 MG capsule Take 1 capsule (100 mg total) by mouth 2 (two) times daily. 11/21/19   Kirby Crigler, Mir Mohammed, MD  enoxaparin (LOVENOX) 30 MG/0.3ML injection Inject 0.3 mLs (30 mg total) into the skin daily. 11/19/19 12/19/19  Albina Billet  III, PA-C  feeding supplement, ENSURE ENLIVE, (ENSURE ENLIVE) LIQD Take 237 mLs by mouth 2 (two) times daily between meals. Patient not taking: Reported on 11/18/2019 05/29/19   Cleora Fleet, MD  furosemide (LASIX) 20 MG tablet Take 1 tablet (20 mg total) by mouth every other day. 05/31/19   Johnson, Clanford L, MD  HYDROcodone-acetaminophen (NORCO) 5-325 MG tablet Take 1 tablet by mouth every 6 (six) hours as needed for severe pain. 11/19/19   Albina Billet III, PA-C  Melatonin 3 MG TABS Take 1  tablet by mouth at bedtime.    [provider]  Multiple Vitamin (MULTIVITAMIN WITH MINERALS) TABS tablet Take 1 tablet by mouth daily. 05/30/19   Johnson, Clanford L, MD  nitroGLYCERIN (NITROSTAT) 0.4 MG SL tablet Place 1 tablet (0.4 mg total) under the tongue every 5 (five) minutes as needed for chest pain (or back pain or arm pain). 11/10/15   Dione Booze, MD  potassium chloride (K-DUR) 10 MEQ tablet Take 1 tablet (10 mEq total) by mouth every other day. Patient taking differently: Take 10 mEq by mouth daily.  05/31/19   Johnson, Clanford L, MD  QUEtiapine (SEROQUEL) 25 MG tablet Take 1 tablet (25 mg total) by mouth at bedtime for 14 days. 11/21/19 12/05/19  Colon Branch, MD    Physical Exam: Vitals:   11/30/19 0357 11/30/19 0408 11/30/19 0741  BP:  101/88   Pulse:  89   Resp:  (!) 24 17  Temp:  97.8 F (36.6 C)   TempSrc:  Oral   SpO2:  90% 92%  Weight: 45 kg      Constitutional: NAD, calm, comfortable Eyes: PERRL, lids and conjunctivae normal ENMT: Mucous membranes are moist. Posterior pharynx clear of any exudate or lesions.Normal dentition.  Neck: normal, supple, no masses, no thyromegaly Respiratory: clear to auscultation bilaterally, no wheezing, no crackles. Normal respiratory effort. No accessory muscle use.  Cardiovascular: Regular rate and rhythm, no murmurs / rubs / gallops. No extremity edema. 2+ pedal pulses. No carotid bruits.  Abdomen: no tenderness, no masses palpated. No hepatosplenomegaly. Bowel sounds positive.  Musculoskeletal: no clubbing / cyanosis. Left lower extremity is shortened and externally rotated Skin: no rashes, lesions, ulcers. No induration Neurologic: CN 2-12 grossly intact. Sensation intact, DTR normal.  Psychiatric: confused, pleasant    Labs on Admission: I have personally reviewed following labs and imaging studies  CBC: No results for input(s): WBC, NEUTROABS, HGB, HCT, MCV, PLT in the last 168 hours. Basic Metabolic  Panel: No results for input(s): NA, K, CL, CO2, GLUCOSE, BUN, CREATININE, CALCIUM, MG, PHOS in the last 168 hours. GFR: Estimated Creatinine Clearance: 39.8 mL/min (by C-G formula based on SCr of 0.84 mg/dL). Liver Function Tests: No results for input(s): AST, ALT, ALKPHOS, BILITOT, PROT, ALBUMIN in the last 168 hours. No results for input(s): LIPASE, AMYLASE in the last 168 hours. No results for input(s): AMMONIA in the last 168 hours. Coagulation Profile: No results for input(s): INR, PROTIME in the last 168 hours. Cardiac Enzymes: No results for input(s): CKTOTAL, CKMB, CKMBINDEX, TROPONINI in the last 168 hours. BNP (last 3 results) No results for input(s): PROBNP in the last 8760 hours. HbA1C: No results for input(s): HGBA1C in the last 72 hours. CBG: No results for input(s): GLUCAP in the last 168 hours. Lipid Profile: No results for input(s): CHOL, HDL, LDLCALC, TRIG, CHOLHDL, LDLDIRECT in the last 72 hours. Thyroid Function Tests: No results for input(s): TSH, T4TOTAL, FREET4, T3FREE, THYROIDAB in the  last 72 hours. Anemia Panel: No results for input(s): VITAMINB12, FOLATE, FERRITIN, TIBC, IRON, RETICCTPCT in the last 72 hours. Urine analysis:    Component Value Date/Time   COLORURINE YELLOW 11/19/2019 1718   APPEARANCEUR CLEAR 11/19/2019 1718   LABSPEC 1.030 11/19/2019 1718   PHURINE 5.0 11/19/2019 1718   GLUCOSEU NEGATIVE 11/19/2019 1718   HGBUR MODERATE (A) 11/19/2019 1718   BILIRUBINUR NEGATIVE 11/19/2019 1718   KETONESUR 20 (A) 11/19/2019 1718   PROTEINUR NEGATIVE 11/19/2019 1718   NITRITE NEGATIVE 11/19/2019 1718   LEUKOCYTESUR SMALL (A) 11/19/2019 1718    Radiological Exams on Admission: DG HIP UNILAT WITH PELVIS 2-3 VIEWS LEFT  Result Date: 11/30/2019 CLINICAL DATA:  Fall with left hip pain EXAM: DG HIP (WITH OR WITHOUT PELVIS) 2-3V LEFT COMPARISON:  None. FINDINGS: Displaced femoral neck fracture. No notable degenerative changes at the left hip. There has  been a right hip hemiarthroplasty. No evidence of pelvic ring fracture or diastasis. Osteopenia. IMPRESSION: Displaced femoral neck fracture. Electronically Signed   By: Monte Fantasia M.D.   On: 11/30/2019 05:02    EKG: Independently reviewed. Sinus tachycardia  Assessment/Plan Active Problems:   CAD S/P remote PCI   NSVT (nonsustained ventricular tachycardia) (HCC)   Dementia (HCC)   Chronic combined systolic and diastolic CHF (congestive heart failure) (HCC)   Hip fracture ([place])     1. Left-sided hip fracture status post fall. Patient recently underwent operative management for right-sided fracture. She was seen by Dr. Mardelle Matte at that time. Case reviewed with Dr. Mardelle Matte and patient will be transferred to [facility] for further management.  We will keep the patient n.p.o. after midnight.  Plans will be for operative management on 1/29.  Hold Lovenox on 1/29 for surgery. 2. Chronic combined CHF. Ejection fraction less than 20%. Does not appear to be decompensated at this time.  Resume Lasix after surgery. 3. Advanced dementia. Continue Seroquel for agitation. 4. Coronary artery disease. Continue on beta-blockers, aspirin. No chest pain at this time.  DVT prophylaxis: Lovenox Code Status: DNR Family Communication: Discussed with daughter Boykin Nearing over the phone Disposition Plan: Transfer to [facility] for further operative management Consults called: Orthopedics, Dr. Mardelle Matte Admission status: Inpatient, telemetry  Kathie Dike MD [facility]   If 7PM-7AM, please contact night-coverage [email or web address]   11/30/2019, 8:24 AM

## 2019-11-30 NOTE — ED Notes (Signed)
Called [facility] inquiring about transport to [facility].

## 2019-11-30 NOTE — Progress Notes (Signed)
Initial Nutrition Assessment  DOCUMENTATION CODES:   Underweight  INTERVENTION:  Ensure Enlive po BID, each supplement provides 350 kcal and 20 grams of protein  Magic cup BID with lunch and dinner meals, each supplement provides 290 kcal and 9 grams of protein (patient is off floor, unable to order HealthTouch supplement at this time)  MVI with minerals daily  NUTRITION DIAGNOSIS:   Increased nutrient needs related to chronic illness, post-op healing(combined CHF(EF <20%); pending procedure for left hip fx s/p right hemi-arthroplasty on 1/17) as evidenced by estimated needs.  GOAL:   Patient will meet greater than or equal to 90% of their needs  MONITOR:   PO intake, Supplement acceptance, Labs, I & O's, Skin, Weight trends  REASON FOR ASSESSMENT:   Consult Assessment of nutrition requirement/status  ASSESSMENT:  RD working remotely.  79 year old female with past medical history of COPD, CAD, combined CHF (EF <20%), dementia, and recently discharged from hospital on 1/17 for right hip fracture s/p right hemi-arthroplasty. Patient presented to ED from with left hip pain s/p unwitnessed fall at facility and admitted for planned surgery after X-ray showed left left hip fracture.  Unable to obtain nutrition history at this time due to patient currently in ED. Per notes, given heart history, pt is pending transfer to [facility] for further operative management. Patient to be NPO at midnight for planned surgery on 1/29.   Currently on HH diet, no meals for review at this time. Per medications, pt to receive Ensure Pre-surgery supplement.   Current wt 99 lbs Weight history reviewed, noted 2.2 lb (2%) wt loss over the past 10 days which is significant for time frame. Highly suspect malnutrition given patient's underweight status, noted cardiac cachexia, and advanced dementia. Unable to identify at this time.  Medications and labs reviewed   NUTRITION - FOCUSED PHYSICAL EXAM: Unable to  complete at this time, RD working remotely.   Diet Order:   Diet Order            Diet Heart Room service appropriate? Yes; Fluid consistency: Thin  Diet effective now              EDUCATION NEEDS:   No education needs have been identified at this time  Skin:  Skin Assessment: Reviewed RN Assessment(incision closed; right hip; MASD; sacrum)  Last BM:  PTA  Height:   Ht Readings from Last 1 Encounters:  11/18/19 5\' 3"  (1.6 m)    Weight:   Wt Readings from Last 1 Encounters:  11/30/19 45 kg    Ideal Body Weight:  52.3 kg  BMI:  Body mass index is 17.57 kg/m.  Estimated Nutritional Needs:   Kcal:  1650-1850  Protein:  80-95  Fluid:  >/= 1.6 L/day   12/02/19, RD, LDN Clinical Nutrition [place] Telephone [phone] After Hours/Weekend Pager: [phone]

## 2019-11-30 NOTE — Progress Notes (Addendum)
Patient known to me, with left hip fracture 10 days after right hip hemiarthroplasty.   Plan transfer to [facility], given heart history, and npo after midnight, and surgery tomorrow afternoon.  Full consult to follow. Discussed via phone with son.   Eulas Post, MD

## 2019-11-30 NOTE — ED Provider Notes (Signed)
[facility] EMERGENCY DEPARTMENT Provider Note   CSN: [id number] Arrival date & time: 11/30/19  0355     History Chief Complaint  Patient presents with  . Hip Pain    Ruth Davidson is a 79 y.o. female.  Patient with history of congestive heart failure, COPD, dementia, right hip fracture and replacement presents with fall and left hip pain.  Unwitnessed fall at the nursing home yesterday.  No reported infectious symptoms.  Patient at baseline with dementia.        Past Medical History:  Diagnosis Date  . Cardiac cachexia   . CHF (congestive heart failure) (HCC)   . COPD (chronic obstructive pulmonary disease) (HCC)   . Coronary artery disease   . Dementia (HCC)   . Fracture of femoral neck, right (HCC) 11/18/2019  . MI (myocardial infarction) (HCC)   . Non-compliance   . Thyroid disease   . Tobacco use     Patient Active Problem List   Diagnosis Date Noted  . Chronic systolic CHF (congestive heart failure) (HCC) 11/19/2019  . Fracture of femoral neck, right (HCC) 11/18/2019  . Hyperthyroidism 05/18/2019  . Hypokalemia 05/17/2019  . Protein-calorie malnutrition, severe 05/17/2019  . Severe protein-calorie malnutrition (HCC) 05/17/2019  . Palliative care by specialist   . Failure to thrive in adult   . Orthostatic hypotension 06/22/2017  . Chronic combined systolic and diastolic CHF (congestive heart failure) (HCC) 06/22/2017  . Goals of care, counseling/discussion   . Palliative care encounter   . Pre-syncope 06/21/2017  . Subdural hematoma (HCC) 06/21/2017  . Acute on chronic systolic CHF (congestive heart failure) (HCC) 08/28/2016  . Noncompliance 08/28/2016  . 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 Emphysema of lung (HCC) 06/11/2016  . 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 Dementia (HCC) 06/11/2016  . NSVT (nonsustained ventricular tachycardia) (HCC) 04/15/2016  . Acute systolic CHF (congestive heart failure) (HCC) 04/15/2016  . Community acquired  pneumonia   . CAD S/P remote PCI 08/30/2013  . Hyperlipidemia 08/30/2013  . Tobacco abuse 08/30/2013    Past Surgical History:  Procedure Laterality Date  . CHOLECYSTECTOMY    . CORONARY ANGIOPLASTY WITH STENT PLACEMENT  2003  . HIP ARTHROPLASTY Right 11/19/2019   Procedure: RIGHT HIP HEMI-ARTHROPLASTY;  Surgeon: Teryl Lucy, MD;  Location: [facility] OR;  Service: Orthopedics;  Laterality: Right;  . THYROIDECTOMY     goiter/no cancer, per report     OB History   No obstetric history on file.     Family History  Problem Relation Age of Onset  . Hypertension Father        Died in war  . Diabetes Mother   . Breast cancer Mother   . Pneumonia Sister   . Hypertension Son     Social History   Tobacco Use  . Smoking status: Current Every Day Smoker    Packs/day: 1.00    Years: 50.00    Pack years: 50.00    Types: Cigarettes    Start date: 11/02/1958  . Smokeless tobacco: Never Used  Substance Use Topics  . Alcohol use: No    Alcohol/week: 0.0 standard drinks  . Drug use: No    Home Medications Prior to Admission medications   Medication Sig Start Date End Date Taking? Authorizing Provider  aspirin EC 81 MG tablet Take 81 mg by mouth daily.    [provider]  atorvastatin (LIPITOR) 40 MG tablet Take 1 tablet (40 mg total) by mouth daily at 6 PM. 11/21/19 12/21/19  Kirby Crigler, Mir Mohammed, MD  bisacodyl (DULCOLAX) 10 MG suppository Place 1 suppository (10 mg total) rectally daily as needed for moderate constipation. 11/21/19   Kirby Crigler, Mir Shirline Frees, MD  carvedilol (COREG) 3.125 MG tablet Take 1 tablet (3.125 mg total) by mouth 2 (two) times daily with a meal. 11/21/19 12/21/19  Kirby Crigler, Mir Shirline Frees, MD  Cholecalciferol 50 MCG (2000 UT) CAPS Take 1 capsule by mouth daily.    [provider]  docusate sodium (COLACE) 100 MG capsule Take 1 capsule (100 mg total) by mouth 2 (two) times daily. 11/21/19   Kirby Crigler, Mir Mohammed, MD  enoxaparin (LOVENOX) 30  MG/0.3ML injection Inject 0.3 mLs (30 mg total) into the skin daily. 11/19/19 12/19/19  Albina Billet III, PA-C  feeding supplement, ENSURE ENLIVE, (ENSURE ENLIVE) LIQD Take 237 mLs by mouth 2 (two) times daily between meals. Patient not taking: Reported on 11/18/2019 05/29/19   Cleora Fleet, MD  furosemide (LASIX) 20 MG tablet Take 1 tablet (20 mg total) by mouth every other day. 05/31/19   Johnson, Clanford L, MD  HYDROcodone-acetaminophen (NORCO) 5-325 MG tablet Take 1 tablet by mouth every 6 (six) hours as needed for severe pain. 11/19/19   Albina Billet III, PA-C  Melatonin 3 MG TABS Take 1 tablet by mouth at bedtime.    [provider]  Multiple Vitamin (MULTIVITAMIN WITH MINERALS) TABS tablet Take 1 tablet by mouth daily. 05/30/19   Johnson, Clanford L, MD  nitroGLYCERIN (NITROSTAT) 0.4 MG SL tablet Place 1 tablet (0.4 mg total) under the tongue every 5 (five) minutes as needed for chest pain (or back pain or arm pain). 11/10/15   Dione Booze, MD  potassium chloride (K-DUR) 10 MEQ tablet Take 1 tablet (10 mEq total) by mouth every other day. Patient taking differently: Take 10 mEq by mouth daily.  05/31/19   Johnson, Clanford L, MD  QUEtiapine (SEROQUEL) 25 MG tablet Take 1 tablet (25 mg total) by mouth at bedtime for 14 days. 11/21/19 12/05/19  Colon Branch, MD    Allergies    Patient has no known allergies.  Review of Systems   Review of Systems  Unable to perform ROS: Intubated    Physical Exam Updated Vital Signs BP 101/88 (BP Location: Left Arm)   Pulse 89   Temp 97.8 F (36.6 C) (Oral)   Resp (!) 24   Wt 45 kg   SpO2 90%   BMI 17.57 kg/m   Physical Exam Vitals and nursing note reviewed.  Constitutional:      Appearance: She is well-developed.  HENT:     Head: Normocephalic and atraumatic.  Eyes:     General:        Right eye: No discharge.        Left eye: No discharge.  Neck:     Trachea: No tracheal deviation.    Cardiovascular:     Rate and Rhythm: Normal rate and regular rhythm.  Pulmonary:     Effort: Pulmonary effort is normal.     Breath sounds: Normal breath sounds.  Abdominal:     General: There is no distension.     Palpations: Abdomen is soft.     Tenderness: There is no abdominal tenderness. There is no guarding.  Musculoskeletal:        General: Tenderness present.     Cervical back: Normal range of motion and neck supple. No rigidity.     Comments: Patient has shortened left leg mild discomfort with external  rotation.  No midline cervical tenderness full range of motion head neck.  Skin:    General: Skin is warm.     Findings: No rash.  Neurological:     General: No focal deficit present.     Mental Status: She is alert.  Psychiatric:     Comments: Clinically dementia, mild agitation     ED Results / Procedures / Treatments   Labs (all labs ordered are listed, but only abnormal results are displayed) Labs Reviewed  RESPIRATORY PANEL BY RT PCR (FLU A&B, COVID)  BASIC METABOLIC PANEL  CBC WITH DIFFERENTIAL/PLATELET  PROTIME-INR  TYPE AND SCREEN    EKG None  Radiology DG HIP UNILAT WITH PELVIS 2-3 VIEWS LEFT  Result Date: 11/30/2019 CLINICAL DATA:  Fall with left hip pain EXAM: DG HIP (WITH OR WITHOUT PELVIS) 2-3V LEFT COMPARISON:  None. FINDINGS: Displaced femoral neck fracture. No notable degenerative changes at the left hip. There has been a right hip hemiarthroplasty. No evidence of pelvic ring fracture or diastasis. Osteopenia. IMPRESSION: Displaced femoral neck fracture. Electronically Signed   By: Monte Fantasia M.D.   On: 11/30/2019 05:02    Procedures Procedures (including critical care time)  Medications Ordered in ED Medications - No data to display  ED Course  I have reviewed the triage vital signs and the nursing notes.  Pertinent labs & imaging results that were available during my care of the patient were reviewed by me and considered in my  medical decision making (see chart for details).    MDM Rules/Calculators/A&P                     Patient presents from nursing home with clinical concern for left hip fracture.  Medical record from nursing home reviewed with multiple medical conditions, x-ray was performed showing left hip fracture.  Unable to see images.  Repeat x-ray confirmed left hip fracture acute.  Order set utilized for preop.  Hospitalist and Ortho paged for admission to plan surgery. Spoke with Dr. Aline Brochure, awaiting Covid test for decision details. Paged hospitalist.  Pain meds ordered.  Blood work pending.  Patient care will be signed out to follow-up blood work and ensure smooth transition to inpatient. Ruth Davidson was evaluated in Emergency Department on 11/30/2019 for the symptoms described in the history of present illness. She was evaluated in the context of the global COVID-19 pandemic, which necessitated consideration that the patient might be at risk for infection with the SARS-CoV-2 virus that causes COVID-19. Institutional protocols and algorithms that pertain to the evaluation of patients at risk for COVID-19 are in a state of rapid change based on information released by regulatory bodies including the CDC and federal and state organizations. These policies and algorithms were followed during the patient's care in the ED.   Final Clinical Impression(s) / ED Diagnoses Final diagnoses:  Closed fracture of left hip, initial encounter ([place])  Fall, initial encounter  Dementia without behavioral disturbance, unspecified dementia type [facility])    Rx / DC Orders ED Discharge Orders    None       Elnora Morrison, MD 11/30/19 [phone]

## 2019-12-01 ENCOUNTER — Encounter (HOSPITAL_COMMUNITY)
Admission: EM | Disposition: A | Discharge status: Skilled Nursing Facility | Payer: Self-pay | Source: Home / Self Care | Attending: Internal Medicine

## 2019-12-01 ENCOUNTER — Encounter (HOSPITAL_COMMUNITY): Payer: Self-pay | Admitting: Internal Medicine

## 2019-12-01 ENCOUNTER — Other Ambulatory Visit: Payer: Self-pay

## 2019-12-01 ENCOUNTER — Inpatient Hospital Stay (HOSPITAL_COMMUNITY): Payer: Medicare Other | Admitting: Certified Registered Nurse Anesthetist

## 2019-12-01 ENCOUNTER — Inpatient Hospital Stay (HOSPITAL_COMMUNITY): Payer: Medicare Other

## 2019-12-01 DIAGNOSIS — F0391 Unspecified dementia with behavioral disturbance: Secondary | ICD-10-CM

## 2019-12-01 DIAGNOSIS — S72002D Fracture of unspecified part of neck of left femur, subsequent encounter for closed fracture with routine healing: Secondary | ICD-10-CM

## 2019-12-01 HISTORY — PX: HIP ARTHROPLASTY: SHX981

## 2019-12-01 LAB — BASIC METABOLIC PANEL
Anion gap: 12 (ref 5–15)
BUN: 17 mg/dL (ref 8–23)
CO2: 24 mmol/L (ref 22–32)
Calcium: 8.5 mg/dL — ABNORMAL LOW (ref 8.9–10.3)
Chloride: 106 mmol/L (ref 98–111)
Creatinine, Ser: 0.99 mg/dL (ref 0.44–1.00)
GFR calc Af Amer: >60 mL/min (ref >60)
GFR calc non Af Amer: 55 mL/min — ABNORMAL LOW (ref >60)
Glucose, Bld: 99 mg/dL (ref 70–99)
Potassium: 3.6 mmol/L (ref 3.5–5.1)
Sodium: 142 mmol/L (ref 135–145)

## 2019-12-01 LAB — CBC
HCT: 38.3 % (ref 36.0–46.0)
Hemoglobin: 12.6 g/dL (ref 12.0–15.0)
MCH: 31.7 pg (ref 26.0–34.0)
MCHC: 32.9 g/dL (ref 30.0–36.0)
MCV: 96.2 fL (ref 80.0–100.0)
Platelets: 268 10*3/uL (ref 150–400)
RBC: 3.98 MIL/uL (ref 3.87–5.11)
RDW: 13.6 % (ref 11.5–15.5)
WBC: 9.7 10*3/uL (ref 4.0–10.5)
nRBC: 0 % (ref 0.0–0.2)

## 2019-12-01 LAB — TYPE AND SCREEN
ABO/RH(D): O POS
Antibody Screen: NEGATIVE

## 2019-12-01 SURGERY — HEMIARTHROPLASTY, HIP, DIRECT ANTERIOR APPROACH, FOR FRACTURE
Anesthesia: Spinal | Site: Hip | Laterality: Left

## 2019-12-01 MED ORDER — BUPIVACAINE IN DEXTROSE 0.75-8.25 % IT SOLN
INTRATHECAL | Status: DC | PRN
  Administered 2019-12-01: 1.5 mL via INTRATHECAL

## 2019-12-01 MED ORDER — SODIUM CHLORIDE 0.9 % IV SOLN: INTRAVENOUS | Status: DC

## 2019-12-01 MED ORDER — FENTANYL CITRATE (PF) 100 MCG/2ML IJ SOLN: 50.0000 ug | Freq: Once | INTRAMUSCULAR | Status: AC

## 2019-12-01 MED ORDER — KETOROLAC TROMETHAMINE 30 MG/ML IJ SOLN
INTRAMUSCULAR | Status: DC | PRN
  Administered 2019-12-01: 30 mg via INTRAVENOUS

## 2019-12-01 MED ORDER — ENOXAPARIN SODIUM 40 MG/0.4ML ~~LOC~~ SOLN
40.0000 mg | SUBCUTANEOUS | Status: DC
  Administered 2019-12-02 – 2019-12-06 (×3): 40 mg via SUBCUTANEOUS
  Filled 2019-12-01 (×5): qty 0.4

## 2019-12-01 MED ORDER — ALBUMIN HUMAN 5 % IV SOLN
INTRAVENOUS | Status: AC
  Filled 2019-12-01: qty 250

## 2019-12-01 MED ORDER — ALUM & MAG HYDROXIDE-SIMETH 200-200-20 MG/5ML PO SUSP: 30.0000 mL | ORAL | Status: DC | PRN

## 2019-12-01 MED ORDER — ONDANSETRON HCL 4 MG PO TABS: 4.0000 mg | ORAL_TABLET | Freq: Four times a day (QID) | ORAL | Status: DC | PRN

## 2019-12-01 MED ORDER — ONDANSETRON HCL 4 MG/2ML IJ SOLN: 4.0000 mg | Freq: Once | INTRAMUSCULAR | Status: DC | PRN

## 2019-12-01 MED ORDER — BISACODYL 10 MG RE SUPP: 10.0000 mg | Freq: Every day | RECTAL | Status: DC | PRN

## 2019-12-01 MED ORDER — ONDANSETRON HCL 4 MG/2ML IJ SOLN
INTRAMUSCULAR | Status: AC
  Filled 2019-12-01: qty 2

## 2019-12-01 MED ORDER — ALBUMIN HUMAN 5 % IV SOLN
12.5000 g | Freq: Once | INTRAVENOUS | Status: AC
  Administered 2019-12-01: 18:00:00 12.5 g via INTRAVENOUS

## 2019-12-01 MED ORDER — MEPERIDINE HCL 25 MG/ML IJ SOLN
INTRAMUSCULAR | Status: AC
  Filled 2019-12-01: qty 1

## 2019-12-01 MED ORDER — ONDANSETRON HCL 4 MG/2ML IJ SOLN: 4.0000 mg | Freq: Four times a day (QID) | INTRAMUSCULAR | Status: DC | PRN

## 2019-12-01 MED ORDER — SODIUM CHLORIDE 0.9 % IR SOLN
Status: DC | PRN
  Administered 2019-12-01: 1000 mL

## 2019-12-01 MED ORDER — SENNA 8.6 MG PO TABS
1.0000 | ORAL_TABLET | Freq: Two times a day (BID) | ORAL | Status: DC
  Administered 2019-12-01 – 2019-12-06 (×7): 8.6 mg via ORAL
  Filled 2019-12-01 (×9): qty 1

## 2019-12-01 MED ORDER — PROPOFOL 500 MG/50ML IV EMUL
INTRAVENOUS | Status: DC | PRN
  Administered 2019-12-01: 50 ug/kg/min via INTRAVENOUS

## 2019-12-01 MED ORDER — KETOROLAC TROMETHAMINE 30 MG/ML IJ SOLN
INTRAMUSCULAR | Status: AC
  Filled 2019-12-01: qty 1

## 2019-12-01 MED ORDER — ONDANSETRON HCL 4 MG/2ML IJ SOLN
INTRAMUSCULAR | Status: DC | PRN
  Administered 2019-12-01: 4 mg via INTRAVENOUS

## 2019-12-01 MED ORDER — PHENOL 1.4 % MT LIQD: 1.0000 | OROMUCOSAL | Status: DC | PRN

## 2019-12-01 MED ORDER — MIDAZOLAM HCL 2 MG/2ML IJ SOLN
INTRAMUSCULAR | Status: AC
  Filled 2019-12-01: qty 2

## 2019-12-01 MED ORDER — EPHEDRINE SULFATE 50 MG/ML IJ SOLN
INTRAMUSCULAR | Status: DC | PRN
  Administered 2019-12-01: 10 mg via INTRAVENOUS

## 2019-12-01 MED ORDER — MAGNESIUM CITRATE PO SOLN: 1.0000 | Freq: Once | ORAL | Status: DC | PRN

## 2019-12-01 MED ORDER — FENTANYL CITRATE (PF) 100 MCG/2ML IJ SOLN: 25.0000 ug | INTRAMUSCULAR | Status: DC | PRN

## 2019-12-01 MED ORDER — PHENYLEPHRINE HCL-NACL 10-0.9 MG/250ML-% IV SOLN
INTRAVENOUS | Status: DC | PRN
  Administered 2019-12-01: 25 ug/min via INTRAVENOUS

## 2019-12-01 MED ORDER — LACTATED RINGERS IV SOLN: INTRAVENOUS | Status: DC | PRN

## 2019-12-01 MED ORDER — PROPOFOL 10 MG/ML IV BOLUS
INTRAVENOUS | Status: DC | PRN
  Administered 2019-12-01: 15 mg via INTRAVENOUS

## 2019-12-01 MED ORDER — ACETAMINOPHEN 500 MG PO TABS
500.0000 mg | ORAL_TABLET | Freq: Four times a day (QID) | ORAL | Status: DC
  Administered 2019-12-01 – 2019-12-02 (×3): 500 mg via ORAL
  Filled 2019-12-01 (×4): qty 1

## 2019-12-01 MED ORDER — FERROUS SULFATE 325 (65 FE) MG PO TABS
325.0000 mg | ORAL_TABLET | Freq: Three times a day (TID) | ORAL | Status: DC
  Administered 2019-12-02 – 2019-12-06 (×7): 325 mg via ORAL
  Filled 2019-12-01 (×11): qty 1

## 2019-12-01 MED ORDER — MENTHOL 3 MG MT LOZG: 1.0000 | LOZENGE | OROMUCOSAL | Status: DC | PRN

## 2019-12-01 MED ORDER — VASOPRESSIN 20 UNIT/ML IV SOLN
INTRAVENOUS | Status: AC
  Filled 2019-12-01: qty 1

## 2019-12-01 MED ORDER — MEPERIDINE HCL 25 MG/ML IJ SOLN
6.2500 mg | INTRAMUSCULAR | Status: DC | PRN
  Administered 2019-12-01: 18:00:00 6.25 mg via INTRAVENOUS

## 2019-12-01 MED ORDER — BUPIVACAINE HCL (PF) 0.5 % IJ SOLN
INTRAMUSCULAR | Status: AC
  Filled 2019-12-01: qty 30

## 2019-12-01 MED ORDER — POLYETHYLENE GLYCOL 3350 17 G PO PACK
17.0000 g | PACK | Freq: Every day | ORAL | Status: DC | PRN
  Administered 2019-12-05: 17 g via ORAL
  Filled 2019-12-01: qty 1

## 2019-12-01 MED ORDER — MUPIROCIN 2 % EX OINT
1.0000 "application " | TOPICAL_OINTMENT | Freq: Two times a day (BID) | CUTANEOUS | Status: AC
  Administered 2019-12-01 – 2019-12-05 (×7): 1 via NASAL
  Filled 2019-12-01 (×3): qty 22

## 2019-12-01 MED ORDER — NITROGLYCERIN 0.4 MG SL SUBL: 0.4000 mg | SUBLINGUAL_TABLET | SUBLINGUAL | Status: DC | PRN

## 2019-12-01 MED ORDER — FENTANYL CITRATE (PF) 100 MCG/2ML IJ SOLN
INTRAMUSCULAR | Status: AC
  Administered 2019-12-01: 14:00:00 50 ug via INTRAVENOUS
  Filled 2019-12-01: qty 2

## 2019-12-01 MED ORDER — POTASSIUM CHLORIDE IN NACL 20-0.45 MEQ/L-% IV SOLN
INTRAVENOUS | Status: DC
  Filled 2019-12-01 (×2): qty 1000

## 2019-12-01 MED ORDER — BUPIVACAINE HCL (PF) 0.5 % IJ SOLN
INTRAMUSCULAR | Status: DC | PRN
  Administered 2019-12-01: 20 mL

## 2019-12-01 MED ORDER — ZINC SULFATE 220 (50 ZN) MG PO CAPS
220.0000 mg | ORAL_CAPSULE | Freq: Every day | ORAL | Status: DC
  Administered 2019-12-01 – 2019-12-06 (×4): 220 mg via ORAL
  Filled 2019-12-01 (×6): qty 1

## 2019-12-01 MED ORDER — PRO-STAT SUGAR FREE PO LIQD
30.0000 mL | Freq: Every day | ORAL | Status: DC
  Administered 2019-12-01 – 2019-12-02 (×2): 30 mL via ORAL
  Filled 2019-12-01 (×6): qty 30

## 2019-12-01 MED ORDER — FENTANYL CITRATE (PF) 250 MCG/5ML IJ SOLN
INTRAMUSCULAR | Status: AC
  Filled 2019-12-01: qty 5

## 2019-12-01 MED ORDER — CEFAZOLIN SODIUM-DEXTROSE 2-4 GM/100ML-% IV SOLN
2.0000 g | Freq: Three times a day (TID) | INTRAVENOUS | Status: AC
  Administered 2019-12-01 – 2019-12-02 (×2): 2 g via INTRAVENOUS
  Filled 2019-12-01 (×2): qty 100

## 2019-12-01 MED ORDER — PROPOFOL 10 MG/ML IV BOLUS
INTRAVENOUS | Status: AC
  Filled 2019-12-01: qty 20

## 2019-12-01 MED ORDER — ASCORBIC ACID 500 MG PO TABS
500.0000 mg | ORAL_TABLET | Freq: Two times a day (BID) | ORAL | Status: DC
  Administered 2019-12-01 – 2019-12-06 (×8): 500 mg via ORAL
  Filled 2019-12-01 (×10): qty 1

## 2019-12-01 SURGICAL SUPPLY — 61 items
APL SKNCLS STERI-STRIP NONHPOA (GAUZE/BANDAGES/DRESSINGS) ×1
BENZOIN TINCTURE PRP APPL 2/3 (GAUZE/BANDAGES/DRESSINGS) ×2 IMPLANT
BLADE SAW SGTL 18.5X63.X.64 HD (BLADE) ×2 IMPLANT
BRUSH FEMORAL CANAL (MISCELLANEOUS) IMPLANT
CLSR STERI-STRIP ANTIMIC 1/2X4 (GAUZE/BANDAGES/DRESSINGS) ×2 IMPLANT
COVER BACK TABLE 24X17X13 BIG (DRAPES) IMPLANT
COVER SURGICAL LIGHT HANDLE (MISCELLANEOUS) ×2 IMPLANT
COVER WAND RF STERILE (DRAPES) IMPLANT
DRAPE IMP U-DRAPE 54X76 (DRAPES) ×2 IMPLANT
DRAPE INCISE IOBAN 66X45 STRL (DRAPES) ×1 IMPLANT
DRAPE ORTHO SPLIT 77X108 STRL (DRAPES) ×4
DRAPE SURG ORHT 6 SPLT 77X108 (DRAPES) ×2 IMPLANT
DRAPE U-SHAPE 47X51 STRL (DRAPES) ×2 IMPLANT
DRSG MEPILEX BORDER 4X12 (GAUZE/BANDAGES/DRESSINGS) IMPLANT
DRSG MEPILEX BORDER 4X8 (GAUZE/BANDAGES/DRESSINGS) ×2 IMPLANT
DURAPREP 26ML APPLICATOR (WOUND CARE) ×2 IMPLANT
ELECT BLADE 6.5 EXT (BLADE) IMPLANT
ELECT CAUTERY BLADE 6.4 (BLADE) ×2 IMPLANT
ELECT REM PT RETURN 9FT ADLT (ELECTROSURGICAL) ×2
ELECTRODE REM PT RTRN 9FT ADLT (ELECTROSURGICAL) ×1 IMPLANT
FACESHIELD WRAPAROUND (MASK) ×4 IMPLANT
GLOVE BIOGEL PI ORTHO PRO SZ8 (GLOVE) ×1
GLOVE ORTHO TXT STRL SZ7.5 (GLOVE) ×2 IMPLANT
GLOVE PI ORTHO PRO STRL SZ8 (GLOVE) ×1 IMPLANT
GLOVE SURG ORTHO 8.0 STRL STRW (GLOVE) ×2 IMPLANT
GOWN STRL REUS W/ TWL XL LVL3 (GOWN DISPOSABLE) ×1 IMPLANT
GOWN STRL REUS W/TWL 2XL LVL3 (GOWN DISPOSABLE) ×2 IMPLANT
GOWN STRL REUS W/TWL XL LVL3 (GOWN DISPOSABLE) ×2
HANDPIECE INTERPULSE COAX TIP (DISPOSABLE)
HEAD FEM UNIPOLAR 45 OD STRL (Hips) ×1 IMPLANT
HOOD PEEL AWAY FLYTE STAYCOOL (MISCELLANEOUS) ×3 IMPLANT
KIT BASIN OR (CUSTOM PROCEDURE TRAY) ×2 IMPLANT
KIT TURNOVER KIT B (KITS) ×2 IMPLANT
MANIFOLD NEPTUNE II (INSTRUMENTS) ×2 IMPLANT
NDL SUT 6 .5 CRC .975X.05 MAYO (NEEDLE) IMPLANT
NEEDLE 22X1 1/2 (OR ONLY) (NEEDLE) ×2 IMPLANT
NEEDLE MAYO TAPER (NEEDLE) ×2
NS IRRIG 1000ML POUR BTL (IV SOLUTION) ×2 IMPLANT
PACK TOTAL JOINT (CUSTOM PROCEDURE TRAY) ×2 IMPLANT
PACK UNIVERSAL I (CUSTOM PROCEDURE TRAY) ×2 IMPLANT
PAD ARMBOARD 7.5X6 YLW CONV (MISCELLANEOUS) ×4 IMPLANT
PILLOW ABDUCTION MEDIUM (MISCELLANEOUS) ×2 IMPLANT
PRESSURIZER FEMORAL UNIV (MISCELLANEOUS) IMPLANT
RETRIEVER SUT HEWSON (MISCELLANEOUS) ×2 IMPLANT
SET HNDPC FAN SPRY TIP SCT (DISPOSABLE) IMPLANT
SPACER DEPUY (Hips) ×1 IMPLANT
STEM SUMMIT BASIC PRESSFIT SZ5 (Hips) ×1 IMPLANT
SUT FIBERWIRE #2 38 REV NDL BL (SUTURE) ×6
SUT VIC AB 0 CT1 27 (SUTURE)
SUT VIC AB 0 CT1 27XBRD ANBCTR (SUTURE) ×1 IMPLANT
SUT VIC AB 1 CT1 27 (SUTURE) ×4
SUT VIC AB 1 CT1 27XBRD ANBCTR (SUTURE) ×2 IMPLANT
SUT VIC AB 2-0 CT2 18 VCP726D (SUTURE) ×1 IMPLANT
SUT VIC AB 3-0 SH 8-18 (SUTURE) ×2 IMPLANT
SUTURE FIBERWR#2 38 REV NDL BL (SUTURE) ×2 IMPLANT
SYR CONTROL 10ML LL (SYRINGE) ×1 IMPLANT
TOWEL GREEN STERILE (TOWEL DISPOSABLE) ×2 IMPLANT
TOWEL GREEN STERILE FF (TOWEL DISPOSABLE) ×2 IMPLANT
TOWER CARTRIDGE SMART MIX (DISPOSABLE) IMPLANT
TRAY FOLEY MTR SLVR 16FR STAT (SET/KITS/TRAYS/PACK) ×1 IMPLANT
WATER STERILE IRR 1000ML POUR (IV SOLUTION) ×8 IMPLANT

## 2019-12-01 NOTE — Anesthesia Postprocedure Evaluation (Signed)
Anesthesia Post Note  Patient: BRANDYE INTHAVONG  Procedure(s) Performed: LEFT HEMI HIP ARTHROPLASTY (Left Hip)     Patient location during evaluation: PACU Anesthesia Type: Spinal Level of consciousness: awake and alert and patient cooperative Pain management: pain level controlled Vital Signs Assessment: post-procedure vital signs reviewed and stable Respiratory status: spontaneous breathing, nonlabored ventilation, respiratory function stable and patient connected to nasal cannula oxygen Cardiovascular status: blood pressure returned to baseline and stable Postop Assessment: no apparent nausea or vomiting, patient able to bend at knees and spinal receding Anesthetic complications: no    Last Vitals:  Vitals:   12/01/19 1815 12/01/19 1830  BP: 119/68 (!) 105/49  Pulse: 88 89  Resp: 16 18  Temp:  36.6 C  SpO2: 97% 97%    Last Pain:  Vitals:   12/01/19 1806  TempSrc:   PainSc: 7                  Dillion Stowers,E. Deseray Daponte

## 2019-12-01 NOTE — Op Note (Signed)
11/30/2019 - 12/01/2019  4:18 PM  PATIENT:  Ruth Davidson   MRN: [id number]  PRE-OPERATIVE DIAGNOSIS:  LEFT HIP FRACTURE  POST-OPERATIVE DIAGNOSIS:  LEFT HIP FRACTURE  PROCEDURE:  Procedure(s): LEFT HEMI HIP ARTHROPLASTY  PREOPERATIVE INDICATIONS:  Ruth Davidson is an 79 y.o. female who was admitted 11/30/2019 with a diagnosis of <principal problem not specified> and elected for surgical management.  The risks benefits and alternatives were discussed with the patient including but not limited to the risks of nonoperative treatment, versus surgical intervention including infection, bleeding, nerve injury, periprosthetic fracture, the need for revision surgery, dislocation, leg length discrepancy, blood clots, cardiopulmonary complications, morbidity, mortality, among others, and they were willing to proceed.  Predicted outcome is good, although there will be at least a six to nine month expected recovery.   OPERATIVE REPORT     SURGEON:  Marchia Bond, MD    ASSISTANT:  Merlene Pulling, PA-C,   (Present throughout the entire procedure,  necessary for completion of procedure in a timely manner, assisting with retraction, instrumentation, and closure)     ANESTHESIA:  spinal  ESTIMATED BLOOD LOSS: 408 ml    COMPLICATIONS:  None.   UNIQUE ASPECTS OF THE CASE: potted the stem proud for rotational control.  She actually reamed to a 7, but filled with 5 in the metaphysis.  Excellent stability.     COMPONENTS:  Chemical engineer Femoral Fracture stem size 5, with a -1.5 spacer and a fracture head unipolar hip ball.    PROCEDURE IN DETAIL: The patient was met in the holding area and identified.  The appropriate hip  was marked at the operative site. The patient was then transported to the OR and  placed under anesthesia.  At that point, the patient was  placed in the lateral decubitus position with the operative side up and  secured to the operating room table and all bony prominences  padded.     The operative lower extremity was prepped from the iliac crest to the toes.  Sterile draping was performed.  Time out was performed prior to incision.      A routine posterolateral approach was utilized via sharp dissection  carried down to the subcutaneous tissue.  Gross bleeders were Bovie  coagulated.  The iliotibial band was identified and incised  along the length of the skin incision.  Self-retaining retractors were  inserted.  With the hip internally rotated, the short external rotators  were identified. The piriformis was tagged with FiberWire, and the hip capsule released in a T-type fashion.  The femoral neck was exposed, and I resected the femoral neck using the appropriate jig. This was performed at approximately a thumb's breadth above the lesser trochanter.    I then exposed the deep acetabulum, cleared out any tissue including the ligamentum teres, and included the hip capsule in the FiberWire used above and below the T.    I then prepared the proximal femur using the cookie-cutter, the lateralizing reamer, and then sequentially broached.  A trial utilized, and I reduced the hip and it was found to have excellent stability with functional range of motion. The trial components were then removed.   I then place the real implant and I impacted the real head ball into place. The hip was then reduced and taken through functional range of motion and found to have excellent stability. Leg lengths were restored.  I then used a 2 mm drill bits to pass the FiberWire suture  from the capsule and piriformis through the greater trochanter, and secured this. Excellent posterior capsular repair was achieved. I also closed the T in the capsule.  I then irrigated the hip copiously again with pulse lavage, and repaired the fascia with Vicryl, followed by Vicryl for the subcutaneous tissue, Monocryl for the skin, Steri-Strips and sterile gauze. The wounds were injected. The patient was then  awakened and returned to PACU in stable and satisfactory condition. There were no complications.  Marchia Bond, MD Orthopedic Surgeon [phone]   12/01/2019 4:18 PM

## 2019-12-01 NOTE — Anesthesia Procedure Notes (Signed)
Spinal  Patient location during procedure: OR Staffing Performed: anesthesiologist  Anesthesiologist: Lewie Loron, MD Preanesthetic Checklist Completed: patient identified, IV checked, site marked, risks and benefits discussed, surgical consent, monitors and equipment checked, pre-op evaluation and timeout performed Spinal Block Patient position: sitting Prep: DuraPrep Patient monitoring: heart rate, cardiac monitor, continuous pulse ox and blood pressure Approach: right paramedian Location: L2-3 Injection technique: single-shot Needle Needle type: Sprotte  Needle gauge: 24 G Needle length: 9 cm Assessment Sensory level: T4

## 2019-12-01 NOTE — Progress Notes (Addendum)
PROGRESS NOTE   Ruth Davidson  GUY:[id number]    DOB: [birth year]    DOA: 11/30/2019  PCP: Hilbert Corrigan, MD   I have briefly reviewed patients previous medical records in [facility].  Chief Complaint:   Chief Complaint  Patient presents with  . 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  Orthopedics consulted, transferred to [facility] for surgery on 1/29 afternoon.   Assessment & Plan:  Active Problems:   CAD S/P remote PCI   NSVT (nonsustained ventricular tachycardia) (HCC)   Dementia (HCC)   Chronic combined systolic and diastolic CHF (congestive heart failure) (HCC)   Hip fracture (HCC)   Closed left hip fracture: Sustained status post fall at SNF.  Orthopedics/Dr. Mardelle Matte consulted and plans surgical fixation this afternoon.  Given her advanced age, frail physical status, multiple severe significant comorbidities as listed above, patient is high risk for CV complications perioperatively but appears cardiovascularly stable without decompensated CHF or angina.  Moreover she recently tolerated right hip hemiarthroplasty.  Thereby she is cleared to proceed with indicated surgery otherwise she is likely to not do well and will have extremely poor quality of life with associated pain.  Likely osteoporotic fracture which will need further evaluation within 4 weeks as outpatient including vitamin D levels, DEXA scan and consider initiating bisphosphonates.  Paroxysmal SVT: Noted on telemetry.  Likely asymptomatic.  Continue beta-blockers and advised RN to make  sure to give this morning's dose.  Continue to monitor on telemetry.  Chronic combined CHF: TTE results as noted above.  Clinically actually appears dry.  Started gentle IV fluid hydration while she is NPO for procedure.  CAD s/p remote PCI: No angina reported.  Continue aspirin, statins and beta-blockers.  Advanced dementia with behavioral abnormalities: Continue prior home dose of Seroquel.  COPD: No clinical bronchospasm.  Hyperlipidemia: Continue statins.  S/p right hip hemiarthroplasty 11/19/2019: Patient was on postop DVT prophylactic dose Lovenox after recent discharge.  Did get a Lovenox dose yesterday.  Discussed with Dr. Mardelle Matte and held today's dose of Lovenox in preparation for surgery and can likely started tomorrow postop.  Body mass index is 17.57 kg/m.  Nutritional Status Nutrition Problem: Increased nutrient needs Etiology: chronic illness, post-op healing(combined CHF(EF <20%); pending procedure for left hip fx s/p right hemi-arthroplasty on 1/17) Signs/Symptoms: estimated needs Interventions: Magic cup, Ensure Enlive (each supplement provides 350kcal and 20 grams of protein)  DVT prophylaxis: SCDs Code Status: DNR Family Communication: None at bedside.  I discussed in detail with patient's daughter via phone, updated care and answered questions.  She indicated that she had already discussed with orthopedics MD. Disposition:  . Patient came from: SNF           . Anticipated d/c place: SNF . Barriers to d/c: Upcoming surgery 1/29 afternoon followed by need for postop recovery, therapies evaluation and should be able to return back to SNF in 2 to 3 days.   Consultants:   Orthopedics  Procedures:   None  Antimicrobials:   None   Subjective:  Patient sleepy but easily arousable.  Oriented only to  self.  Poor historian.  Denies complaints.  However indicates pain when asked to move her left lower extremity.  Objective:   Vitals:   11/30/19 1756 11/30/19 1923  12/01/19 0414 12/01/19 0842  BP:  101/64 101/65 (!) 111/56  Pulse: (!) 104 99 96 89  Resp:   14 18  Temp:  98.6 F (37 C) 97.8 F (36.6 C) 97.7 F (36.5 C)  TempSrc:  Oral Oral Oral  SpO2:  90% 92% 95%  Weight:        General exam: Elderly female, moderately built, thinly nourished lying comfortably propped up in bed without distress.  Oral mucosa dry. Respiratory system: Clear to auscultation. Respiratory effort normal. Cardiovascular system: S1 & S2 heard, RRR. No JVD, murmurs, rubs, gallops or clicks. No pedal edema.  Telemetry personally reviewed: Noted a couple of episodes of nonsustained SVT up to 140s-160s but otherwise predominantly in sinus rhythm. Gastrointestinal system: Abdomen is nondistended, soft and nontender. No organomegaly or masses felt. Normal bowel sounds heard. Central nervous system: Mental status as noted above. No focal neurological deficits. Extremities: Moves both upper extremities spontaneously.  Did move her right lower extremity some.  Left lower extremity shortened and externally rotated, restricted movements due to pain. Skin: No rashes, lesions or ulcers Psychiatry: Judgement and insight impaired. Mood & affect cannot be assessed at this time.     Data Reviewed:   I have personally reviewed following labs and imaging studies   CBC: Recent Labs  Lab 11/30/19 1022 12/01/19 0246  WBC 13.9* 9.7  NEUTROABS 12.2*  --   HGB 12.6 12.6  HCT 40.0 38.3  MCV 99.3 96.2  PLT 294 268    Basic Metabolic Panel: Recent Labs  Lab 11/30/19 1022 12/01/19 0246  NA 138 142  K 4.0 3.6  CL 103 106  CO2 27 24  GLUCOSE 111* 99  BUN 16 17  CREATININE 0.84 0.99  CALCIUM 8.6* 8.5*    Liver Function Tests: No results for input(s): AST, ALT, ALKPHOS, BILITOT, PROT, ALBUMIN in the last 168 hours.  CBG: No results for input(s): GLUCAP in the last 168 hours.  Microbiology Studies:   Recent Results (from the past 240 hour(s))  SARS CORONAVIRUS 2 (TAT  6-24 HRS) Nasopharyngeal Nasopharyngeal Swab     Status: None   Collection Time: 11/21/19 11:35 PM   Specimen: Nasopharyngeal Swab  Result Value Ref Range Status   SARS Coronavirus 2 NEGATIVE NEGATIVE Final    Comment: (NOTE) SARS-CoV-2 target nucleic acids are NOT DETECTED. The SARS-CoV-2 RNA is generally detectable in upper and lower respiratory specimens during the acute phase of infection. Negative results do not preclude SARS-CoV-2 infection, do not rule out co-infections with other pathogens, and should not be used as the sole basis for treatment or other patient management decisions. Negative results must be combined with clinical observations, patient history, and epidemiological information. The expected result is Negative. Fact Sheet for Patients: HairSlick.no Fact Sheet for Healthcare Providers: quierodirigir.com This test is not yet approved or cleared by the [place] FDA and  has been authorized for detection and/or diagnosis of SARS-CoV-2 by FDA under an Emergency Use Authorization (EUA). This EUA will remain  in effect (meaning this test can be used) for the duration of the COVID-19 declaration under Section 56 4(b)(1) of the Act, 21 U.S.C. section 360bbb-3(b)(1), unless the authorization is terminated or revoked sooner. Performed at [facility], [address] [place], [place] [address]   Respiratory Panel by  RT PCR (Flu A&B, Covid) - Nasopharyngeal Swab     Status: None   Collection Time: 11/30/19  5:20 AM   Specimen: Nasopharyngeal Swab  Result Value Ref Range Status   SARS Coronavirus 2 by RT PCR NEGATIVE NEGATIVE Final    Comment: (NOTE) SARS-CoV-2 target nucleic acids are NOT DETECTED. The SARS-CoV-2 RNA is generally detectable in upper respiratoy specimens during the acute phase of infection. The lowest concentration of SARS-CoV-2 viral copies this assay can detect is 131 copies/mL. A  negative result does not preclude SARS-Cov-2 infection and should not be used as the sole basis for treatment or other patient management decisions. A negative result may occur with  improper specimen collection/handling, submission of specimen other than nasopharyngeal swab, presence of viral mutation(s) within the areas targeted by this assay, and inadequate number of viral copies (<131 copies/mL). A negative result must be combined with clinical observations, patient history, and epidemiological information. The expected result is Negative. Fact Sheet for Patients:  https://www.moore.com/ Fact Sheet for Healthcare Providers:  https://www.young.biz/ This test is not yet ap proved or cleared by the [place] FDA and  has been authorized for detection and/or diagnosis of SARS-CoV-2 by FDA under an Emergency Use Authorization (EUA). This EUA will remain  in effect (meaning this test can be used) for the duration of the COVID-19 declaration under Section 564(b)(1) of the Act, 21 U.S.C. section 360bbb-3(b)(1), unless the authorization is terminated or revoked sooner.    Influenza A by PCR NEGATIVE NEGATIVE Final   Influenza B by PCR NEGATIVE NEGATIVE Final    Comment: (NOTE) The Xpert Xpress SARS-CoV-2/FLU/RSV assay is intended as an aid in  the diagnosis of influenza from Nasopharyngeal swab specimens and  should not be used as a sole basis for treatment. Nasal washings and  aspirates are unacceptable for Xpert Xpress SARS-CoV-2/FLU/RSV  testing. Fact Sheet for Patients: https://www.moore.com/ Fact Sheet for Healthcare Providers: https://www.young.biz/ This test is not yet approved or cleared by the [place] FDA and  has been authorized for detection and/or diagnosis of SARS-CoV-2 by  FDA under an Emergency Use Authorization (EUA). This EUA will remain  in effect (meaning this test can be used) for  the duration of the  Covid-19 declaration under Section 564(b)(1) of the Act, 21  U.S.C. section 360bbb-3(b)(1), unless the authorization is  terminated or revoked. Performed at [facility], [address], [place], [place] [address]      Radiology Studies:  No results found.   Scheduled Meds:   . aspirin EC  81 mg Oral Daily  . atorvastatin  40 mg Oral q1800  . carvedilol  3.125 mg Oral BID WC  . chlorhexidine  60 mL Topical Once  . enoxaparin (LOVENOX) injection  40 mg Subcutaneous Q24H  . feeding supplement (ENSURE ENLIVE)  237 mL Oral BID BM  . feeding supplement  296 mL Oral Once  . multivitamin with minerals  1 tablet Oral Daily  . povidone-iodine  2 application Topical Once  . QUEtiapine  25 mg Oral QHS    Continuous Infusions:   . sodium chloride 50 mL/hr at 12/01/19 0838  .  ceFAZolin (ANCEF) IV       LOS: 1 day     Marcellus Scott, MD, [place], [facility]. [facility]    To contact the attending provider between 7A-7P or the covering provider during after hours 7P-7A, please log into the web site [email or web address] and access using universal [place] password for that web site. If  you do not have the password, please call the hospital operator.  12/01/2019, 9:50 AM

## 2019-12-01 NOTE — Plan of Care (Signed)

## 2019-12-01 NOTE — Anesthesia Procedure Notes (Signed)
Arterial Line Insertion Performed by: Lewie Loron, MD, Modena Morrow, CRNA, anesthesiologist  Patient location: Pre-op. Preanesthetic checklist: patient identified, IV checked, site marked, risks and benefits discussed, surgical consent, monitors and equipment checked, pre-op evaluation, timeout performed and anesthesia consent Lidocaine 1% used for infiltration Right, radial was placed Catheter size: 20 Fr Hand hygiene performed  and maximum sterile barriers used   Attempts: 3 Procedure performed using ultrasound guided technique. Ultrasound Notes:anatomy identified, needle tip was noted to be adjacent to the nerve/plexus identified and image(s) printed for medical record Following insertion, dressing applied and Biopatch. Post procedure assessment: normal and unchanged  Patient tolerated the procedure well with no immediate complications. Additional procedure comments: Attempt on left by Charlies Silvers and Erinn Mendosa. US guided by Keigan Tafoya on right. Marland Kitchen

## 2019-12-01 NOTE — Progress Notes (Signed)
Phil Dopp, RN picked up patient's single (yellow with gray designs) earring that patient was wearing on her right fifth finger.

## 2019-12-01 NOTE — Anesthesia Preprocedure Evaluation (Addendum)
Anesthesia Evaluation  Patient identified by MRN, date of birth, ID band  Reviewed: Allergy & Precautions, NPO status , Patient's Chart, lab work & pertinent test results, reviewed documented beta blocker date and time   Airway Mallampati: II  TM Distance: >3 FB Neck ROM: Full    Dental  (+) Dental Advisory Given   Pulmonary COPD, Current Smoker and Patient abstained from smoking.,    Pulmonary exam normal breath sounds clear to auscultation       Cardiovascular Pt. on home beta blockers + CAD, + Past MI and +CHF  Normal cardiovascular exam Rhythm:Regular Rate:Normal  ECG: ST, rate 106  ECHO: 1. Left ventricular ejection fraction, by visual estimation, is <20%. The  left ventricle has severely decreased function. There is no increased left  ventricular wall thickness.  2. Left ventricular diastolic parameters are consistent with Grade I  diastolic dysfunction (impaired relaxation).  3. The left ventricle demonstrates global hypokinesis.  4. Mild to moderate mitral annular calcification.  5. The mitral valve is grossly normal. Trivial mitral valve  regurgitation.  6. No significant change from prior study.    Neuro/Psych PSYCHIATRIC DISORDERS Dementia negative neurological ROS     GI/Hepatic negative GI ROS, Neg liver ROS,   Endo/Other  Hyperthyroidism   Renal/GU negative Renal ROS     Musculoskeletal negative musculoskeletal ROS (+)   Abdominal   Peds  Hematology HLD   Anesthesia Other Findings LEFT HIP FRACTURE  Reproductive/Obstetrics                            Anesthesia Physical Anesthesia Plan  ASA: IV  Anesthesia Plan: Spinal   Post-op Pain Management:    Induction:   PONV Risk Score and Plan: 2 and Propofol infusion, Ondansetron and Treatment may vary due to age or medical condition  Airway Management Planned:   Additional Equipment: Arterial line  Intra-op  Plan:   Post-operative Plan:   Informed Consent: I have reviewed the patients History and Physical, chart, labs and discussed the procedure including the risks, benefits and alternatives for the proposed anesthesia with the patient or authorized representative who has indicated his/her understanding and acceptance.   Patient has DNR.  Discussed DNR with power of attorney and Suspend DNR.   Dental advisory given  Plan Discussed with: CRNA  Anesthesia Plan Comments: (Discussed DNR with Dondra Prader, the patient's daughter. She agreed to limited code while in the OR.)      Anesthesia Quick Evaluation

## 2019-12-01 NOTE — Transfer of Care (Signed)
Immediate Anesthesia Transfer of Care Note  Patient: Ruth Davidson  Procedure(s) Performed: LEFT HEMI HIP ARTHROPLASTY (Left Hip)  Patient Location: PACU  Anesthesia Type:General and Spinal  Level of Consciousness: drowsy and patient cooperative  Airway & Oxygen Therapy: Patient Spontanous Breathing  Post-op Assessment: Report given to RN and Post -op Vital signs reviewed and stable  Post vital signs: Reviewed and stable  Last Vitals:  Vitals Value Taken Time  BP    Temp    Pulse    Resp    SpO2      Last Pain:  Vitals:   12/01/19 1227  TempSrc:   PainSc: Asleep         Complications: No apparent anesthesia complications

## 2019-12-01 NOTE — Consult Note (Signed)
ORTHOPAEDIC CONSULTATION  REQUESTING PHYSICIAN: Elease Etienne, MD  Chief Complaint: Left hip pain  HPI: Ruth Davidson is a 79 y.o. female former smoker with history of CAD (MI in 2003, 2006, 2017), HTN, hyperlipidemia, hypothyroidism, COPD, and advanced dementia who complains of left hip pain. She currently resides in the Memory Care Unit at [facility]. Recent history of unwitnessed fall on 11/18/19 with subsequent right hip fracture and right hip hemiarthroplasty performed on 11/19/19 by Dr. Dion Saucier. She was discharged back to [facility] on 1/20. She presented to [facility] ED on 11/30/19 after another unwitnessed fall which occurred on 11/29/19 and was transported to [facility] ED for surgical intervention due to her cardiac history.   History and exam are limited due to dementia. Today she is complaining of being thirsty and "it hurts" but is unable to specify where or to what degree.   Past Medical History:  Diagnosis Date  . Cardiac cachexia   . CHF (congestive heart failure) (HCC)   . COPD (chronic obstructive pulmonary disease) (HCC)   . Coronary artery disease   . Dementia (HCC)   . Fracture of femoral neck, right (HCC) 11/18/2019  . MI (myocardial infarction) (HCC)   . Non-compliance   . Thyroid disease   . Tobacco use    Past Surgical History:  Procedure Laterality Date  . CHOLECYSTECTOMY    . CORONARY ANGIOPLASTY WITH STENT PLACEMENT  2003  . HIP ARTHROPLASTY Right 11/19/2019   Procedure: RIGHT HIP HEMI-ARTHROPLASTY;  Surgeon: Teryl Lucy, MD;  Location: [facility] OR;  Service: Orthopedics;  Laterality: Right;  . THYROIDECTOMY     goiter/no cancer, per report   Social History   Socioeconomic History  . Marital status: Widowed    Spouse name: Not on file  . Number of children: Not on file  . Years of education: Not on file  . Highest education level: Not on file  Occupational History  . Not on file  Tobacco Use  . Smoking status: Current Every Day Smoker    Packs/day:  1.00    Years: 50.00    Pack years: 50.00    Types: Cigarettes    Start date: 11/02/1958  . Smokeless tobacco: Never Used  Substance and Sexual Activity  . Alcohol use: No    Alcohol/week: 0.0 standard drinks  . Drug use: No  . Sexual activity: Never  Other Topics Concern  . Not on file  Social History Narrative   Widowed. 3 children. Lives with daughter for one year. Retired. Used to work at [employer].    Social Determinants of Health   Financial Resource Strain: Unknown  . Difficulty of Paying Living Expenses: Patient refused  Food Insecurity: Unknown  . Worried About Programme researcher, broadcasting/film/video in the Last Year: Patient refused  . Ran Out of Food in the Last Year: Patient refused  Transportation Needs: Unknown  . Lack of Transportation (Medical): Patient refused  . Lack of Transportation (Non-Medical): Patient refused  Physical Activity: Unknown  . Days of Exercise per Week: Patient refused  . Minutes of Exercise per Session: Patient refused  Stress: Unknown  . Feeling of Stress : Patient refused  Social Connections: Unknown  . Frequency of Communication with Friends and Family: Patient refused  . Frequency of Social Gatherings with Friends and Family: Patient refused  . Attends Religious Services: Patient refused  . Active Member of Clubs or Organizations: Patient refused  . Attends Banker Meetings: Patient refused  . Marital Status:  Patient refused   Family History  Problem Relation Age of Onset  . Hypertension Father        Died in war  . Diabetes Mother   . Breast cancer Mother   . Pneumonia Sister   . Hypertension Son    No Known Allergies   Positive ROS: All other systems have been reviewed and were otherwise negative with the exception of those mentioned in the HPI and as above.  Physical Exam: General: Examined in pre-operative area. Alert, in mild distress due to pain and thirst.  Cardiovascular: No pedal edema Respiratory: No cyanosis, no use of  accessory musculature GI: No organomegaly, abdomen is soft and non-tender Skin: No lesions located on left lower extremity.  Neurologic: Exam limited, patient responding both yes and no at times when questioned regarding sensation at right and left foot. Lymphatic: No axillary or cervical lymphadenopathy  MUSCULOSKELETAL:  RLE: incision from previous hip hemi are healing well with no signs of infection. Able to move all toes of right foot. 2+ DP pulse. LLE: Left leg externally rotated and shortened. Groaning to any movement of right leg. 2+ DP pulse.   Assessment: Active Problems:   CAD S/P remote PCI   NSVT (nonsustained ventricular tachycardia) (HCC)   Dementia (HCC)   Chronic combined systolic and diastolic CHF (congestive heart failure) (HCC)   Hip fracture (HCC)  Left Hip fracture after unwitnessed fall  Plan: - Plan for left hip hemiarthroplasty with Dr. Mardelle Matte today - plan for Lovenox for DVT prophylaxis post-op (patient currently still on one month Lovenox from right hemi)  Ventura Bruns, PA-C   12/01/2019 2:03 PM

## 2019-12-01 NOTE — Plan of Care (Signed)

## 2019-12-01 NOTE — Progress Notes (Signed)
When moving pt for q2hr turns pt becomes agitated and HR increases to the 150-160s.

## 2019-12-01 NOTE — Discharge Instructions (Signed)

## 2019-12-02 LAB — BASIC METABOLIC PANEL
Anion gap: 10 (ref 5–15)
BUN: 20 mg/dL (ref 8–23)
CO2: 24 mmol/L (ref 22–32)
Calcium: 7.9 mg/dL — ABNORMAL LOW (ref 8.9–10.3)
Chloride: 107 mmol/L (ref 98–111)
Creatinine, Ser: 0.83 mg/dL (ref 0.44–1.00)
GFR calc Af Amer: >60 mL/min (ref >60)
GFR calc non Af Amer: >60 mL/min (ref >60)
Glucose, Bld: 75 mg/dL (ref 70–99)
Potassium: 4 mmol/L (ref 3.5–5.1)
Sodium: 141 mmol/L (ref 135–145)

## 2019-12-02 LAB — CBC
HCT: 31.4 % — ABNORMAL LOW (ref 36.0–46.0)
Hemoglobin: 9.9 g/dL — ABNORMAL LOW (ref 12.0–15.0)
MCH: 31.2 pg (ref 26.0–34.0)
MCHC: 31.5 g/dL (ref 30.0–36.0)
MCV: 99.1 fL (ref 80.0–100.0)
Platelets: 225 10*3/uL (ref 150–400)
RBC: 3.17 MIL/uL — ABNORMAL LOW (ref 3.87–5.11)
RDW: 13.5 % (ref 11.5–15.5)
WBC: 6.4 10*3/uL (ref 4.0–10.5)
nRBC: 0 % (ref 0.0–0.2)

## 2019-12-02 MED ORDER — HYDROCODONE-ACETAMINOPHEN 5-325 MG PO TABS
1.0000 | ORAL_TABLET | Freq: Three times a day (TID) | ORAL | Status: DC | PRN
  Administered 2019-12-02 – 2019-12-04 (×5): 1 via ORAL
  Filled 2019-12-02 (×6): qty 1

## 2019-12-02 MED ORDER — ACETAMINOPHEN 500 MG PO TABS
1000.0000 mg | ORAL_TABLET | Freq: Three times a day (TID) | ORAL | Status: DC
  Administered 2019-12-02 – 2019-12-06 (×10): 1000 mg via ORAL
  Filled 2019-12-02 (×12): qty 2

## 2019-12-02 MED ORDER — SODIUM CHLORIDE 0.9 % IV SOLN: INTRAVENOUS | Status: AC

## 2019-12-02 MED ORDER — MORPHINE SULFATE (PF) 2 MG/ML IV SOLN
0.5000 mg | INTRAVENOUS | Status: DC | PRN
  Administered 2019-12-02 – 2019-12-03 (×5): 0.5 mg via INTRAVENOUS
  Filled 2019-12-02 (×6): qty 1

## 2019-12-02 NOTE — Progress Notes (Signed)
PROGRESS NOTE   Ruth Davidson  ZDG:[id number]    DOB: May 14, 1941    DOA: 11/30/2019  PCP: Hilbert Corrigan, MD   I have briefly reviewed patients previous medical records in [facility].  Chief Complaint:   Chief Complaint  Patient presents with  . 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  Orthopedics consulted, transferred to [facility] and s/p left hip hemiarthroplasty 1/29.  Assessment & Plan:  Active Problems:   CAD S/P remote PCI   NSVT (nonsustained ventricular tachycardia) (HCC)   Dementia (HCC)   Chronic combined systolic and diastolic CHF (congestive heart failure) (HCC)   Hip fracture (HCC)   Closed left hip fracture: Sustained status post fall at SNF.  Orthopedics/Dr. Mardelle Matte consulted and after preop clearance underwent left hip hemiarthroplasty 1/29. Likely osteoporotic fracture which will need further evaluation within 4 weeks as outpatient including vitamin D levels, DEXA scan and consider initiating bisphosphonates.  Await orthopedic follow-up regarding activity status.  Therapies here to evaluate but unsure if she will be able to cooperate well given mental status changes.  Ongoing Lovenox for DVT prophylaxis.  Postop acute blood loss anemia: EBL 250 mL.  Hemoglobin dropped from 12.6 preop to 9.9.  Follow CBC daily and transfuse if hemoglobin 7 g or less.  Paroxysmal SVT: Noted on telemetry.  Likely asymptomatic.  Continue beta-blockers.  No further episodes of SVT.  Since no further episodes of SVT over the last 24 hours and to avoid  measures that could cause worsening agitation, discontinued telemetry.  Chronic combined CHF: TTE results as noted above.  Has been on IV fluids since yesterday due to clinical dehydration and NPO.  Surgeons had placed patient on IVF 75 mL an hour, will cut down to 50 mL an hour until able to tolerate p.o. well and then discontinue.  CHF remains compensated.  CAD s/p remote PCI: No angina reported.  Continue aspirin, statins and beta-blockers.  Advanced dementia with behavioral abnormalities: Continue prior home dose of Seroquel.  Reported agitation overnight.  Currently sleepy but arousable.  Delirium precautions.  Minimize opioids and sedatives as much as possible.  COPD: No clinical bronchospasm.  Hyperlipidemia: Continue statins.  S/p right hip hemiarthroplasty 11/19/2019: Patient was on postop DVT prophylactic dose Lovenox after recent discharge.  Continue Lovenox DVT prophylaxis.  Body mass index is 16.51 kg/m.  Nutritional Status Nutrition Problem: Increased nutrient needs Etiology: chronic illness, post-op healing(combined CHF(EF <20%); pending procedure for left hip fx s/p right hemi-arthroplasty on 1/17) Signs/Symptoms: estimated needs Interventions: Magic cup, Ensure Enlive (each supplement provides 350kcal and 20 grams of protein)  DVT prophylaxis: SCDs, Lovenox Code Status: DNR Family Communication: None at bedside.  I discussed in detail with patient's daughter via phone 1/29, updated care and answered questions.  She indicated that she had already discussed with orthopedics MD. Disposition:  . Patient came from: SNF           . Anticipated d/c place: SNF . Barriers to d/c: Pending clinical improvement including pain control, mobilization and recommendations by therapies, improvement in mental status.   Consultants:  Orthopedics  Procedures:   Left hip hemiarthroplasty 1/29  Antimicrobials:   None   Subjective:  Patient sleepy but arousable.  Oriented to self.   Unable to provide further history.  As per RN, agitated overnight.  Objective:   Vitals:   12/01/19 1959 12/02/19 0011 12/02/19 0423 12/02/19 0835  BP: 115/78 106/61 114/64 (!) 104/55  Pulse: 88 71 75 68  Resp: 20 16 16 16   Temp:  (!) 97.5 F (36.4 C) (!) 97.5 F (36.4 C) (!) 97.5 F (36.4 C)  TempSrc:  Oral Oral Oral  SpO2: 99% 100% 100% 100%  Weight:      Height:        General exam: Elderly female, moderately built, thinly nourished lying comfortably propped up in bed without distress.  Oral mucosa borderline hydration. Respiratory system: Poor inspiratory effort but seems clear to auscultation.  No increased work of breathing.  Is on room air. Cardiovascular system: S1 and S2 heard, RRR.  No JVD, murmurs or pedal edema.  Telemetry personally reviewed: Sinus rhythm. Gastrointestinal system: Abdomen is nondistended, soft and nontender. No organomegaly or masses felt. Normal bowel sounds heard. Central nervous system: Mental status as noted above. No focal neurological deficits. Extremities: Moves both upper extremities spontaneously.  Did move her right lower extremity some.  Left hip postop dressing clean and dry. Skin: No rashes, lesions or ulcers Psychiatry: Judgement and insight impaired. Mood & affect cannot be assessed at this time.     Data Reviewed:   I have personally reviewed following labs and imaging studies   CBC: Recent Labs  Lab 11/30/19 1022 12/01/19 0246 12/02/19 0507  WBC 13.9* 9.7 6.4  NEUTROABS 12.2*  --   --   HGB 12.6 12.6 9.9*  HCT 40.0 38.3 31.4*  MCV 99.3 96.2 99.1  PLT 294 268 225    Basic Metabolic Panel: Recent Labs  Lab 11/30/19 1022 12/01/19 0246 12/02/19 0507  NA 138 142 141  K 4.0 3.6 4.0  CL 103 106 107  CO2 27 24 24   GLUCOSE 111* 99 75  BUN 16 17 20   CREATININE 0.84 0.99 0.83  CALCIUM 8.6* 8.5* 7.9*    Liver Function Tests: No results for input(s): AST, ALT, ALKPHOS, BILITOT, PROT, ALBUMIN in the last 168  hours.  CBG: No results for input(s): GLUCAP in the last 168 hours.  Microbiology Studies:   Recent Results (from the past 240 hour(s))  Respiratory Panel by RT PCR (Flu A&B, Covid) - Nasopharyngeal Swab     Status: None   Collection Time: 11/30/19  5:20 AM   Specimen: Nasopharyngeal Swab  Result Value Ref Range Status   SARS Coronavirus 2 by RT PCR NEGATIVE NEGATIVE Final    Comment: (NOTE) SARS-CoV-2 target nucleic acids are NOT DETECTED. The SARS-CoV-2 RNA is generally detectable in upper respiratoy specimens during the acute phase of infection. The lowest concentration of SARS-CoV-2 viral copies this assay can detect is 131 copies/mL. A negative result does not preclude SARS-Cov-2 infection and should not be used as the sole basis for treatment or other patient management decisions. A negative result may occur with  improper specimen collection/handling, submission of specimen other than nasopharyngeal swab, presence of viral mutation(s) within the areas targeted by this assay, and inadequate number of viral copies (<131 copies/mL). A negative result must be combined with clinical observations, patient history, and epidemiological information. The expected result is Negative. Fact Sheet for Patients:  Fact Sheet for Healthcare Providers:  This  test is not yet ap proved or cleared by the [place] and  has been authorized for detection and/or diagnosis of SARS-CoV-2 by FDA under an Emergency Use Authorization (EUA). This EUA will remain  in effect (meaning this test can be used) for the duration of the COVID-19 declaration under Section 564(b)(1) of the Act, 21 U.S.C. section 360bbb-3(b)(1), unless the authorization is terminated or revoked sooner.    Influenza A by PCR NEGATIVE NEGATIVE Final   Influenza B by PCR NEGATIVE NEGATIVE Final    Comment: (NOTE) The Xpert Xpress  SARS-CoV-2/FLU/RSV assay is intended as an aid in  the diagnosis of influenza from Nasopharyngeal swab specimens and  should not be used as a sole basis for treatment. Nasal washings and  aspirates are unacceptable for Xpert Xpress SARS-CoV-2/FLU/RSV  testing. Fact Sheet for Patients: https://www.moore.com/ Fact Sheet for Healthcare Providers: https://www.young.biz/ This test is not yet approved or cleared by the [place] FDA and  has been authorized for detection and/or diagnosis of SARS-CoV-2 by  FDA under an Emergency Use Authorization (EUA). This EUA will remain  in effect (meaning this test can be used) for the duration of the  Covid-19 declaration under Section 564(b)(1) of the Act, 21  U.S.C. section 360bbb-3(b)(1), unless the authorization is  terminated or revoked. Performed at [facility], [address] [place], [place] [address]      Radiology Studies:  Pelvis Portable  Result Date: 12/01/2019 CLINICAL DATA:  Post left hip hemiarthroplasty. EXAM: PORTABLE PELVIS 1-2 VIEWS COMPARISON:  Radiograph 11/17/2019, 11/18/2018 FINDINGS: There are expected postsurgical soft tissue changes from recent left hip hemiarthroplasty. A recent right hip hemiarthroplasty is noted as well. No periprosthetic fracture or hardware complications are seen in either hip. Remaining bones of the pelvis are intact. Expected postsurgical soft tissue changes noted in the left hip including intra-articular gas. IMPRESSION: Expected postsurgical changes from recent left hip hemiarthroplasty. No periprosthetic fracture or hardware complications. Electronically Signed   By: Kreg Shropshire M.D.   On: 12/01/2019 19:16     Scheduled Meds:   . acetaminophen  500 mg Oral Q6H  . ascorbic acid  500 mg Oral BID  . aspirin EC  81 mg Oral Daily  . atorvastatin  40 mg Oral q1800  . carvedilol  3.125 mg Oral BID WC  . enoxaparin (LOVENOX) injection  40 mg Subcutaneous Q24H    . feeding supplement (ENSURE ENLIVE)  237 mL Oral BID BM  . feeding supplement (PRO-STAT SUGAR FREE 64)  30 mL Oral Daily  . ferrous sulfate  325 mg Oral TID PC  . multivitamin with minerals  1 tablet Oral Daily  . mupirocin ointment  1 application Nasal BID  . QUEtiapine  25 mg Oral QHS  . senna  1 tablet Oral BID  . zinc sulfate  220 mg Oral Daily    Continuous Infusions:   . 0.45 % NaCl with KCl 20 mEq / L 75 mL/hr at 12/01/19 2156     LOS: 2 days     Marcellus Scott, MD, [place], [facility]    To contact the attending provider between 7A-7P or the covering provider during after hours 7P-7A, please log into the web site [email or web address] and access using universal [place] password for that web site. If you do not have the password, please call the hospital operator.  12/02/2019, 10:04 AM

## 2019-12-02 NOTE — Progress Notes (Signed)
Called for iv team consult for piv restart.  Pt pulling arms away, removing touniquet.  Refuses new piv, "leave me alone".

## 2019-12-02 NOTE — Plan of Care (Signed)
  Problem: Pain Managment: Goal: General experience of comfort will improve Outcome: Progressing   Problem: Safety: Goal: Ability to remain free from injury will improve Outcome: Progressing   Problem: Skin Integrity: Goal: Risk for impaired skin integrity will decrease Outcome: Progressing   

## 2019-12-02 NOTE — Progress Notes (Signed)
PT Cancellation Note  Patient Details Name: Ruth Davidson MRN: [id number] DOB: 26-Feb-1941   Cancelled Treatment:    Reason Eval/Treat Not Completed: Active bedrest order. Pt currently with bed rest orders in place. PT will continue to f/u with pt acutely and await updated activity orders prior to initiating PT evaluation.    Alessandra Bevels Lauris Keepers 12/02/2019, 7:44 AM

## 2019-12-02 NOTE — Progress Notes (Signed)
Nursing staff in room.  Pt beeing comforted.  Now agreeable to piv start.

## 2019-12-02 NOTE — Progress Notes (Signed)
Subjective: 1 Day Post-Op Procedure(s) (LRB): LEFT HEMI HIP ARTHROPLASTY (Left) Patient in bed no real c/o pain, pleasantly confused.  Objective: Vital signs in last 24 hours: Temp:  [97.5 F (36.4 C)-97.9 F (36.6 C)] 97.5 F (36.4 C) (01/30 0835) Pulse Rate:  [68-91] 68 (01/30 0835) Resp:  [13-20] 16 (01/30 0835) BP: (84-119)/(34-78) 104/55 (01/30 0835) SpO2:  [92 %-100 %] 100 % (01/30 0835) Arterial Line BP: (87-113)/(48-56) 105/48 (01/29 1830) Weight:  [45 kg] 45 kg (01/29 1356)  Intake/Output from previous day: 01/29 0701 - 01/30 0700 In: 1328.4 [I.V.:1228.4; IV Piggyback:100] Out: 1175 [Urine:900; Blood:275] Intake/Output this shift: Total I/O In: 120 [P.O.:120] Out: -   Recent Labs    11/30/19 1022 12/01/19 0246 12/02/19 0507  HGB 12.6 12.6 9.9*   Recent Labs    12/01/19 0246 12/02/19 0507  WBC 9.7 6.4  RBC 3.98 3.17*  HCT 38.3 31.4*  PLT 268 225   Recent Labs    12/01/19 0246 12/02/19 0507  NA 142 141  K 3.6 4.0  CL 106 107  CO2 24 24  BUN 17 20  CREATININE 0.99 0.83  GLUCOSE 99 75  CALCIUM 8.5* 7.9*   Recent Labs    11/30/19 1022  INR 1.1    Neurovascular intact Sensation intact distally Intact pulses distally Dorsiflexion/Plantar flexion intact Incision: dressing C/D/I   Assessment/Plan: 1 Day Post-Op Procedure(s) (LRB): LEFT HEMI HIP ARTHROPLASTY (Left)  11 days post op R Hip Hemi Mental status change with baseline dementia  From orthopaedic standpoint pt may be WBAT bilat LE, high fall risk would only get up with assistance.  Again from orthopaedic standpoint would not require bedrest unless this is a safety issue with fall risk and her dementia.    Minimize opoid pain meds to reduce confusion  lovenox for dvt proph dispo plan for return to memory care unit early in the week   Anticipated LOS equal to or greater than 2 midnights due to - Age 78 and older with one or more of the following:  - Obesity  - Expected need for  hospital services (PT, OT, Nursing) required for safe  discharge  - Anticipated need for postoperative skilled nursing care or inpatient rehab . Cardiac cachexia   . CHF (congestive heart failure) (HCC)   . COPD (chronic obstructive pulmonary disease) (HCC)   . Coronary artery disease   . Dementia (HCC)   . Fracture of femoral neck, right (HCC) 11/18/2019  . MI (myocardial infarction) (HCC)   . Non-compliance   . Thyroid disease   . Tobacco use     OR   - Unanticipated findings during/Post Surgery: Slow post-op progression: GI, pain control, mobility  - Patient is a high risk of re-admission due to: Non-elective hospital admission within previous 6 months, 2 or more ED visits within previous 6 months and Barriers to post-acute care (logistical, no family support in home)    Margart Sickles 12/02/2019, 12:23 PM

## 2019-12-03 LAB — BASIC METABOLIC PANEL
Anion gap: 9 (ref 5–15)
BUN: 18 mg/dL (ref 8–23)
CO2: 23 mmol/L (ref 22–32)
Calcium: 8.2 mg/dL — ABNORMAL LOW (ref 8.9–10.3)
Chloride: 110 mmol/L (ref 98–111)
Creatinine, Ser: 0.65 mg/dL (ref 0.44–1.00)
GFR calc Af Amer: >60 mL/min (ref >60)
GFR calc non Af Amer: >60 mL/min (ref >60)
Glucose, Bld: 91 mg/dL (ref 70–99)
Potassium: 4.1 mmol/L (ref 3.5–5.1)
Sodium: 142 mmol/L (ref 135–145)

## 2019-12-03 LAB — CBC
HCT: 31.1 % — ABNORMAL LOW (ref 36.0–46.0)
Hemoglobin: 10.2 g/dL — ABNORMAL LOW (ref 12.0–15.0)
MCH: 32.1 pg (ref 26.0–34.0)
MCHC: 32.8 g/dL (ref 30.0–36.0)
MCV: 97.8 fL (ref 80.0–100.0)
Platelets: 258 10*3/uL (ref 150–400)
RBC: 3.18 MIL/uL — ABNORMAL LOW (ref 3.87–5.11)
RDW: 13.4 % (ref 11.5–15.5)
WBC: 7.3 10*3/uL (ref 4.0–10.5)
nRBC: 0 % (ref 0.0–0.2)

## 2019-12-03 MED ORDER — HALOPERIDOL LACTATE 5 MG/ML IJ SOLN
1.0000 mg | Freq: Two times a day (BID) | INTRAMUSCULAR | Status: DC | PRN
  Administered 2019-12-03: 11:00:00 1 mg via INTRAVENOUS
  Filled 2019-12-03: qty 1

## 2019-12-03 MED ORDER — SODIUM CHLORIDE 0.9 % IV SOLN: INTRAVENOUS | Status: DC

## 2019-12-03 NOTE — Plan of Care (Addendum)
Pt agitated, impulsive, and attempting to get out of bed since this morning. Spoke to MD this AM letting him know this is how she was yesterday and requested 1:1 sitter order. Staffing called and stated there was no sitter available yet. Still in need of one. Pt is high fall risk and continues to try to get out of bed.   She pulled out her L FA IV, pending IV team to place new one. She became combative when staff would go in the room to help her to the bathroom, up in bed, etc.  Pt refused most medications today and abduction pillow. Will continue to monitor.   Problem: Education: Goal: Knowledge of General Education information will improve Description: Including pain rating scale, medication(s)/side effects and non-pharmacologic comfort measures Outcome: Progressing   Problem: Health Behavior/Discharge Planning: Goal: Ability to manage health-related needs will improve Outcome: Progressing   Problem: Clinical Measurements: Goal: Will remain free from infection Outcome: Progressing   Problem: Nutrition: Goal: Adequate nutrition will be maintained Outcome: Progressing   Problem: Coping: Goal: Level of anxiety will decrease Outcome: Progressing   Problem: Elimination: Goal: Will not experience complications related to bowel motility Outcome: Progressing   Problem: Pain Managment: Goal: General experience of comfort will improve Outcome: Progressing   Problem: Safety: Goal: Ability to remain free from injury will improve Outcome: Progressing   Problem: Skin Integrity: Goal: Risk for impaired skin integrity will decrease Outcome: Progressing

## 2019-12-03 NOTE — Progress Notes (Addendum)
PROGRESS NOTE   Ruth Davidson  NOB:[id number]    DOB: [birth year]    DOA: 11/30/2019  PCP: Bernerd Limbo, MD   I have briefly reviewed patients previous medical records in [facility].  Chief Complaint:   Chief Complaint  Patient presents with  . 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  Orthopedics consulted, transferred to [facility] and s/p left hip hemiarthroplasty 1/29.  Hospital course complicated by delirium with intermittent agitation and with inconsistent and poor oral intake.  Assessment & Plan:  Active Problems:   CAD S/P remote PCI   NSVT (nonsustained ventricular tachycardia) (HCC)   Dementia (HCC)   Chronic combined systolic and diastolic CHF (congestive heart failure) (HCC)   Hip fracture (HCC)   Closed left hip fracture: Sustained status post fall at SNF.  Orthopedics/Dr. Dion Saucier consulted and after preop clearance underwent left hip hemiarthroplasty 1/29. Likely osteoporotic fracture which will need further evaluation within 4 weeks as outpatient including vitamin D levels, DEXA scan and consider initiating bisphosphonates.  As per orthopedic follow-up: Weightbearing as tolerated bilateral lower extremities, remains high fall risk and would only get up with assistance, minimize opioids to reduce confusion and continue Lovenox for DVT prophylaxis.  Mobilize with therapy as tolerated.  Postop acute blood loss anemia: EBL 250 mL.  Hemoglobin dropped from 12.6 preop to 9.9.  Hemoglobin improved to 10.2.  Continue to monitor CBC.  Paroxysmal  SVT: Noted on telemetry.  Likely asymptomatic.  Continue beta-blockers.  No further episodes of SVT and telemetry was discontinued 1/30.  Chronic combined CHF: TTE results as noted above.  Clinically with borderline hydration or even on the dry side due to inconsistent and poor oral intake.  Diuretics on hold.  Continue gentle IV fluid hydration with close monitoring.  CAD s/p remote PCI: No angina reported.  Continue aspirin, statins and beta-blockers.  Advanced dementia with behavioral abnormalities/agitated delirium: Continue prior home dose of Seroquel. Delirium precautions.  Minimize opioids and sedatives as much as possible.  Ongoing nightly worsening confusion/agitation, expected given sundowning, pain, pain medicine, disturbance of sleep rhythm, unfamiliar surroundings and personnel complicating advanced dementia.  Minimize opioids, sedatives, work on achieving adequate overnight sleep, if have to then consider low-dose Haldol.  Pleasantly confused this morning without agitation.  COPD: No clinical bronchospasm.  Hyperlipidemia: Continue statins.  S/p right hip hemiarthroplasty 11/19/2019: Patient was on postop DVT prophylactic dose Lovenox after recent discharge.  Continue Lovenox DVT prophylaxis.  Body mass index is 16.51 kg/m.  Nutritional Status Nutrition Problem: Increased nutrient needs Etiology: chronic illness, post-op healing(combined CHF(EF <20%); pending procedure for left hip fx s/p right hemi-arthroplasty on 1/17) Signs/Symptoms: estimated needs Interventions: Magic cup, Ensure Enlive (each supplement provides 350kcal and 20 grams of protein)  DVT prophylaxis: SCDs, Lovenox Code Status: DNR Family Communication: None at bedside.  I discussed in detail with patient's daughter via phone on 1/31, updated care and answered questions.  She stated that she came to see her mother yesterday and plans to come by again tomorrow.  Updated her regarding delirium and measures being  taken.  She verbalized  understanding. Disposition:  . Patient came from: SNF           . Anticipated d/c place: SNF . Barriers to d/c: Ongoing agitated delirium, inability to work with therapies yet.   Consultants:   Orthopedics  Procedures:   Left hip hemiarthroplasty 1/29  Antimicrobials:   None   Subjective:  Overnight events noted.  Discussed with RN, was confused, agitated, pulled out multiple IV lines, needed a Air cabin crew.  This morning alert, pleasantly confused, oriented only to self, follows some simple instructions, denies pain and keeps repeating "leave me alone".  Objective:   Vitals:   12/02/19 0835 12/02/19 1233 12/02/19 1928 12/03/19 0426  BP: (!) 104/55 104/62 107/64 (!) 135/59  Pulse: 68 76 80 92  Resp: 16 16 18    Temp: (!) 97.5 F (36.4 C) 97.8 F (36.6 C) 97.7 F (36.5 C) 97.7 F (36.5 C)  TempSrc: Oral Oral Oral Oral  SpO2: 100% 100% 100% 100%  Weight:      Height:        General exam: Elderly female, moderately built, thinly nourished lying comfortably propped up in bed without distress.  Oral mucosa with borderline hydration Respiratory system: Clear to auscultation.  No increased work of breathing. Cardiovascular system: S1 and S2 heard, RRR.  No JVD, murmurs or pedal edema. Gastrointestinal system: Abdomen is nondistended, soft and nontender. No organomegaly or masses felt. Normal bowel sounds heard. Central nervous system: Alert and oriented only to self.  Follows some simple instructions. No focal neurological deficits. Extremities: Moves both extremities well and tries to shove way examiner's hand.  Bilateral lower extremities at least 2 x 5 power.  Left hip surgical site dressing clean and dry. Skin: No rashes, lesions or ulcers Psychiatry: Judgement and insight impaired. Mood & affect cannot be assessed at this time.     Data Reviewed:   I have personally reviewed following labs and imaging studies   CBC: Recent Labs  Lab  12/16/19 1022 12/16/2019 1022 12/01/19 0246 12/02/19 0507 12/03/19 0614  WBC 13.9*   < > 9.7 6.4 7.3  NEUTROABS 12.2*  --   --   --   --   HGB 12.6   < > 12.6 9.9* 10.2*  HCT 40.0   < > 38.3 31.4* 31.1*  MCV 99.3   < > 96.2 99.1 97.8  PLT 294   < > 268 225 258   < > = values in this interval not displayed.    Basic Metabolic Panel: Recent Labs  Lab 12/01/19 0246 12/02/19 0507 12/03/19 0614  NA 142 141 142  K 3.6 4.0 4.1  CL 106 107 110  CO2 24 24 23   GLUCOSE 99 75 91  BUN 17 20 18   CREATININE 0.99 0.83 0.65  CALCIUM 8.5* 7.9* 8.2*    Liver Function Tests: No results for input(s): AST, ALT, ALKPHOS, BILITOT, PROT, ALBUMIN in the last 168 hours.  CBG: No results for input(s): GLUCAP in the last 168 hours.  Microbiology Studies:   Recent Results (from the past 240 hour(s))  Respiratory Panel by RT PCR (Flu A&B, Covid) - Nasopharyngeal Swab     Status: None   Collection Time: 12-16-2019  5:20 AM   Specimen: Nasopharyngeal Swab  Result Value Ref Range Status   SARS Coronavirus 2 by RT PCR NEGATIVE NEGATIVE Final    Comment: (NOTE) SARS-CoV-2 target nucleic acids are NOT DETECTED. The SARS-CoV-2 RNA is generally detectable in upper respiratoy specimens during the acute phase  of infection. The lowest concentration of SARS-CoV-2 viral copies this assay can detect is 131 copies/mL. A negative result does not preclude SARS-Cov-2 infection and should not be used as the sole basis for treatment or other patient management decisions. A negative result may occur with  improper specimen collection/handling, submission of specimen other than nasopharyngeal swab, presence of viral mutation(s) within the areas targeted by this assay, and inadequate number of viral copies (<131 copies/mL). A negative result must be combined with clinical observations, patient history, and epidemiological information. The expected result is Negative. Fact Sheet for Patients:   https://www.moore.com/ Fact Sheet for Healthcare Providers:  https://www.young.biz/ This test is not yet ap proved or cleared by the [place] FDA and  has been authorized for detection and/or diagnosis of SARS-CoV-2 by FDA under an Emergency Use Authorization (EUA). This EUA will remain  in effect (meaning this test can be used) for the duration of the COVID-19 declaration under Section 564(b)(1) of the Act, 21 U.S.C. section 360bbb-3(b)(1), unless the authorization is terminated or revoked sooner.    Influenza A by PCR NEGATIVE NEGATIVE Final   Influenza B by PCR NEGATIVE NEGATIVE Final    Comment: (NOTE) The Xpert Xpress SARS-CoV-2/FLU/RSV assay is intended as an aid in  the diagnosis of influenza from Nasopharyngeal swab specimens and  should not be used as a sole basis for treatment. Nasal washings and  aspirates are unacceptable for Xpert Xpress SARS-CoV-2/FLU/RSV  testing. Fact Sheet for Patients: https://www.moore.com/ Fact Sheet for Healthcare Providers: https://www.young.biz/ This test is not yet approved or cleared by the [place] FDA and  has been authorized for detection and/or diagnosis of SARS-CoV-2 by  FDA under an Emergency Use Authorization (EUA). This EUA will remain  in effect (meaning this test can be used) for the duration of the  Covid-19 declaration under Section 564(b)(1) of the Act, 21  U.S.C. section 360bbb-3(b)(1), unless the authorization is  terminated or revoked. Performed at [facility], [address] [place], [place] [address]      Radiology Studies:  No results found.   Scheduled Meds:   . acetaminophen  1,000 mg Oral TID  . ascorbic acid  500 mg Oral BID  . aspirin EC  81 mg Oral Daily  . atorvastatin  40 mg Oral q1800  . carvedilol  3.125 mg Oral BID WC  . enoxaparin (LOVENOX) injection  40 mg Subcutaneous Q24H  . feeding supplement (ENSURE  ENLIVE)  237 mL Oral BID BM  . feeding supplement (PRO-STAT SUGAR FREE 64)  30 mL Oral Daily  . ferrous sulfate  325 mg Oral TID PC  . multivitamin with minerals  1 tablet Oral Daily  . mupirocin ointment  1 application Nasal BID  . QUEtiapine  25 mg Oral QHS  . senna  1 tablet Oral BID  . zinc sulfate  220 mg Oral Daily    Continuous Infusions:      LOS: 3 days     Marcellus Scott, MD, [place], [facility]. [facility]    To contact the attending provider between 7A-7P or the covering provider during after hours 7P-7A, please log into the web site [email or web address] and access using universal [place] password for that web site. If you do not have the password, please call the hospital operator.  12/03/2019, 9:56 AM

## 2019-12-03 NOTE — Progress Notes (Addendum)
Patient is agitated and trying to get out of bed. She is combative and hitting nurse with her fist and saying " leave me alone". Says " my leg hurt a little bit" RN to continue to monitor pt status.     Addendum: Pt refuses to keep abduction pillow in. She takes  it out repeatedly and throws it on the floor. RN will continue to monitor

## 2019-12-03 NOTE — Progress Notes (Signed)
Subjective: 2 Days Post-Op Procedure(s) (LRB): LEFT HEMI HIP ARTHROPLASTY (Left) Patient reports pain as mild.  Baseline confusion, did move around some with PT this AM.  Objective: Vital signs in last 24 hours: Temp:  [97.7 F (36.5 C)-97.8 F (36.6 C)] 97.7 F (36.5 C) (01/31 0426) Pulse Rate:  [76-92] 92 (01/31 0426) Resp:  [16-18] 18 (01/30 1928) BP: (104-135)/(59-64) 135/59 (01/31 0426) SpO2:  [100 %] 100 % (01/31 0426)  Intake/Output from previous day: 01/30 0701 - 01/31 0700 In: 1266.1 [P.O.:597; I.V.:669.1] Out: 200 [Urine:200] Intake/Output this shift: No intake/output data recorded.  Recent Labs    11/30/19 1022 12/01/19 0246 12/02/19 0507 12/03/19 0614  HGB 12.6 12.6 9.9* 10.2*   Recent Labs    12/02/19 0507 12/03/19 0614  WBC 6.4 7.3  RBC 3.17* 3.18*  HCT 31.4* 31.1*  PLT 225 258   Recent Labs    12/02/19 0507 12/03/19 0614  NA 141 142  K 4.0 4.1  CL 107 110  CO2 24 23  BUN 20 18  CREATININE 0.83 0.65  GLUCOSE 75 91  CALCIUM 7.9* 8.2*   Recent Labs    11/30/19 1022  INR 1.1    Neurovascular intact Sensation intact distally Intact pulses distally Dorsiflexion/Plantar flexion intact Incision: dressing C/D/I   Assessment/Plan: 2 Days Post-Op Procedure(s) (LRB): LEFT HEMI HIP ARTHROPLASTY (Left) Up with therapy  From orthopaedic standpoint pt may be WBAT bilat LE, high fall risk would only get up with assistance.  Again from orthopaedic standpoint would not require bedrest unless this is a safety issue with fall risk and her dementia.    Minimize opoid pain meds to reduce confusion  lovenox for dvt proph dispo plan for return to memory care unit early in the week   Anticipated LOS equal to or greater than 2 midnights due to - Age 78 and older with one or more of the following:             - Obesity             - Expected need for hospital services (PT, OT, Nursing) required for safe   discharge             - Anticipated need  for postoperative skilled nursing care or inpatient rehab . Cardiac cachexia   . CHF (congestive heart failure) (HCC)   . COPD (chronic obstructive pulmonary disease) (HCC)   . Coronary artery disease   . Dementia (HCC)   . Fracture of femoral neck, right (HCC) 11/18/2019  . MI (myocardial infarction) (HCC)   . Non-compliance   . Thyroid disease   . Tobacco use     OR   - Unanticipated findings during/Post Surgery: Slow post-op progression: GI, pain control, mobility  - Patient is a high risk of re-admission due to: Non-elective hospital admission within previous 6 months, 2 or more ED visits within previous 6 months and Barriers to post-acute care (logistical, no family support in home)     Margart Sickles 12/03/2019, 10:16 AM

## 2019-12-03 NOTE — Progress Notes (Signed)
Notified Dr Bruna Potter that pt is agitated and has pulled out IVx2 tonight even with mittens on.  Pt has received prn pain meds as ordered Requesting medication to help pt rest and relax. RN will continue to monitor patient's status.

## 2019-12-03 NOTE — Evaluation (Signed)
Physical Therapy Evaluation Patient Details Name: Ruth Davidson MRN: [id number] DOB: [birth year] Today's Date: 12/03/2019   History of Present Illness  Ruth Davidson is a 79 y.o. female former smoker with history of CAD (MI in 2003, 2006, 2017), HTN, hyperlipidemia, hypothyroidism, COPD, and advanced dementia who complains of left hip pain. She currently resides in the Memory Care Unit at [facility]. Recent history of unwitnessed fall on 11/18/19 with subsequent right hip fracture and right hip hemiarthroplasty performed on 11/19/19 by Dr. Mardelle Matte. She was discharged back to [facility] on 1/20. She presented to [facility] ED on 11/30/19 after another unwitnessed fall which occurred on 11/29/19 and was transported to [facility] ED for surgical intervention due to her cardiac history; Now s/p L hip fracture with hemiarthroplasty, posterolateral approach  Clinical Impression   Patient is s/p above surgery resulting in functional limitations due to the deficits listed below (see PT Problem List). Comes back to [place] after another fall; not quite sure of her PLOF prior to initial fall earlier this month; presents to PT with decr functional mobility, cognitive impairments making participation difficult, ROM restrictions (Post Hip) bilaterally;  Patient will benefit from skilled PT to increase their independence and safety with mobility to allow discharge to the venue listed below.    If her Memory Care Unit cannot meet her rehab and mobility needs, can she go the the rehab section at [facility]?    Follow Up Recommendations SNF;Supervision/Assistance - 24 hour    Equipment Recommendations  Rolling walker with 5" wheels;3in1 (PT);Other (comment)(tbd next venue of care)    Recommendations for Other Services       Precautions / Restrictions Precautions Precautions: Posterior Hip;Fall Bilateral Posterior Hip Prec  Per Ortho: do the best you can re: Posterior Hip Prec  Restrictions Weight Bearing  Restrictions:  RLE Weight Bearing: Weight bearing as tolerated LLE Weight Bearing: Weight bearing as tolerated      Mobility  Bed Mobility Overal bed mobility: Needs Assistance Bed Mobility: Supine to Sit;Sit to Supine     Supine to sit: Min assist;Mod assist;HOB elevated(used bed rails) Sit to supine: +2 for physical assistance;Max assist   General bed mobility comments: Ruth Davidson pulled herself to long sit in the bed from [facility] moderately elevated, using rail; Mod assist and use of bed pad to square off hips at EOB; she scooted hips forward and backward with relative ease; put her R hand on the footboard for stability  Transfers Overall transfer level: Needs assistance Equipment used: 2 person hand held assist Transfers: Sit to/from Stand Sit to Stand: Mod assist;+2 physical assistance;+2 safety/equipment         General transfer comment: Heavy mod assist to power up to stand; Posterior lean; tended to pull away from physical assist  Ambulation/Gait                Stairs            Wheelchair Mobility    Modified Rankin (Stroke Patients Only)       Balance     Sitting balance-Leahy Scale: Fair       Standing balance-Leahy Scale: Zero Standing balance comment: Did not tolerate standing for long                             Pertinent Vitals/Pain Pain Assessment: Faces Faces Pain Scale: Hurts little more Pain Location: Unspecified; Grimace likely related to her restlessness/frustration; did  not specifically look like pain, but difficult to discern due to agitation Pain Descriptors / Indicators: Grimacing Pain Intervention(s): Monitored during session    Home Living Family/patient expects to be discharged to:: Skilled nursing facility                 Additional Comments: memory care facility; due to cognition, unable to aquire details    Prior Function Level of Independence: (unknown due to cognition)         Comments:  unable to obtain PLOF info form pt due to cognition     Hand Dominance        Extremity/Trunk Assessment   Upper Extremity Assessment Upper Extremity Assessment: Generalized weakness    Lower Extremity Assessment Lower Extremity Assessment: RLE deficits/detail;LLE deficits/detail;Difficult to assess due to impaired cognition RLE Deficits / Details: Noting voluntary movement throughout RLE; Generally weak, needing heavy mod assist to power up to stand LLE Deficits / Details: Noting voluntary movement throughout; she did not allow a more thorough assessment       Communication   Communication: No difficulties  Cognition Arousal/Alertness: Awake/alert Behavior During Therapy: Restless;Anxious Overall Cognitive Status: History of cognitive impairments - at baseline                                 General Comments: pt with hx of demetia; difficulty following commands, answering questions; decreased safety awareness; States she wants to get up, then when initaiting getting up, she says, "Don't do me like that"      General Comments General comments (skin integrity, edema, etc.): Unable to get full assessment of mobiltiy due to her refusal of assist, and refusal to move    Exercises     Assessment/Plan    PT Assessment Patient needs continued PT services  PT Problem List Decreased strength;Decreased range of motion;Decreased balance;Decreased activity tolerance;Decreased mobility;Decreased coordination;Decreased cognition;Decreased knowledge of use of DME;Decreased safety awareness;Decreased knowledge of precautions;Pain       PT Treatment Interventions DME instruction;Gait training;Stair training;Functional mobility training;Therapeutic activities;Therapeutic exercise;Balance training;Neuromuscular re-education;Cognitive remediation;Patient/family education;Wheelchair mobility training;Modalities    PT Goals (Current goals can be found in the Care Plan section)   Acute Rehab PT Goals Patient Stated Goal: unable PT Goal Formulation: Patient unable to participate in goal setting Time For Goal Achievement: 12/17/19 Potential to Achieve Goals: Fair    Frequency Min 2X/week   Barriers to discharge   Hopefully she can go back to her memory care unit with familiar envronment, caregivers, and routine -- though not sure haw much assist she can have on the memory care unit; Appropriate for SNF level of care    Co-evaluation               AM-PAC PT "6 Clicks" Mobility  Outcome Measure Help needed turning from your back to your side while in a flat bed without using bedrails?: A Lot Help needed moving from lying on your back to sitting on the side of a flat bed without using bedrails?: A Lot Help needed moving to and from a bed to a chair (including a wheelchair)?: A Lot Help needed standing up from a chair using your arms (e.g., wheelchair or bedside chair)?: A Lot Help needed to walk in hospital room?: Total Help needed climbing 3-5 steps with a railing? : Total 6 Click Score: 10    End of Session Equipment Utilized During Treatment: Gait belt Activity Tolerance: Other (comment)(Limited  by restlessnesss) Patient left: in bed;with call bell/phone within reach;with bed alarm set Nurse Communication: Mobility status;Precautions PT Visit Diagnosis: Unsteadiness on feet (R26.81);Other abnormalities of gait and mobility (R26.89);History of falling (Z91.81);Muscle weakness (generalized) (M62.81);Other symptoms and signs involving the nervous system (R29.898)    Time: 9767-3419 PT Time Calculation (min) (ACUTE ONLY): 14 min   Charges:   PT Evaluation $PT Eval Moderate Complexity: 1 Mod          Ruth Davidson, [place]  Acute Rehabilitation Services Pager [phone] Office [phone]   Ruth Davidson 12/03/2019, 3:46 PM

## 2019-12-04 ENCOUNTER — Encounter: Payer: Self-pay | Admitting: *Deleted

## 2019-12-04 MED ORDER — SODIUM CHLORIDE 0.9 % IV SOLN: INTRAVENOUS | Status: DC

## 2019-12-04 NOTE — Plan of Care (Signed)
  Problem: Pain Managment: Goal: General experience of comfort will improve Outcome: Progressing   Problem: Safety: Goal: Ability to remain free from injury will improve Outcome: Progressing   Problem: Skin Integrity: Goal: Risk for impaired skin integrity will decrease Outcome: Progressing   

## 2019-12-04 NOTE — Consult Note (Signed)
   [facility] CM Inpatient Consult   12/04/2019  Ruth Davidson 1941-03-02 [id number]    Patient reviewed for 34%extreme high risk score for unplanned readmissionwith 7 day and 30 day readmissions in the past 6 months; and check forpotential Center her [facility] insurance plan.  Per MD notes, patient had recent hospitalization 11/17/2019-11/23/2019 for right femoral neck fracture and status post right hip hemiarthroplasty on 11/19/2019. Returned to ED following a fall at [facility] prior to admission and had left hip fracture.Transferred to [facility] and underwent left hip hemiarthroplasty 12/01/19.  Hospital course complicated by agitated delirium, and with inconsistent and poor oral intake.  Brief chart review reveals that patient was readmitted from [facility] facility (resident in the memory care unit) and a long term care resident there, with anticipated plan toreturn back to SNF rehab (skilled nursing facility) per therapy evaluation.    Will sign off as patient's care will be met at the skilled level of care.   For questions, pleasecontact:  Edwena Felty A. Virdell Hoiland, BSN, RN-BC [facility] Liaison Cell: [phone]

## 2019-12-04 NOTE — Progress Notes (Signed)
     Subjective: 3 Days Post-Op: Left Hemi hip arthroplasty  Patient laying comfortably in bed. Sitter by bedside. Sitter reports no agitation since the beginning of her shift this morning. Patient denies any pain.   Objective:   VITALS:   Vitals:   12/02/19 1928 12/03/19 0426 12/03/19 1932 12/04/19 0326  BP: 107/64 (!) 135/59 122/71 120/79  Pulse: 80 92 91 86  Resp: 18  20 16   Temp: 97.7 F (36.5 C) 97.7 F (36.5 C) 98.3 F (36.8 C) 97.8 F (36.6 C)  TempSrc: Oral Oral  Oral  SpO2: 100% 100% 98% 99%  Weight:      Height:        ABD soft Neurovascular intact Sensation intact distally Intact pulses distally Dorsiflexion/Plantar flexion intact Incision: dressing C/D/I   Lab Results  Component Value Date   WBC 7.3 12/03/2019   HGB 10.2 (L) 12/03/2019   HCT 31.1 (L) 12/03/2019   MCV 97.8 12/03/2019   PLT 258 12/03/2019   BMET    Component Value Date/Time   NA 142 12/03/2019 0614   K 4.1 12/03/2019 0614   CL 110 12/03/2019 0614   CO2 23 12/03/2019 0614   GLUCOSE 91 12/03/2019 0614   BUN 18 12/03/2019 0614   CREATININE 0.65 12/03/2019 0614   CREATININE 1.10 (H) 08/20/2017 1247   CALCIUM 8.2 (L) 12/03/2019 0614   GFRNONAA >60 12/03/2019 0614   GFRAA >60 12/03/2019 12/05/2019     Assessment/Plan: 3 Days Post-Op   Active Problems:   CAD S/P remote PCI   NSVT (nonsustained ventricular tachycardia) (HCC)   Dementia (HCC)   Chronic combined systolic and diastolic CHF (congestive heart failure) (HCC)   Hip fracture (HCC)  3 Days Post-Op: Left Hemi hip arthroplasty - Up with therapy - WBAT bilateral lower extremities - remains high fall risk, only get up with assistance - Surgical dressing in place, history of removing dressings during last admission, can replace with new mepilex if she removes dressing - Lovenox for DVT prophylaxis - patient refused dose yesterday, I encouraged patient to take daily medications  - Advanced dementia with delirium and agitation -  limit opioid pain medication, tylenol first line for pain, sitter in place - plan for discharge back to [facility] this week    [facility] 12/04/2019, 7:30 AM   02/01/2020, MD Cell [phone]

## 2019-12-04 NOTE — TOC Initial Note (Signed)
Transition of Care [facility]) - Initial/Assessment Note    Patient Details  Name: Ruth Davidson MRN: [id number] Date of Birth: 05/02/1941  [facility]) CM/SW Contact:    Bartholomew Crews, RN Phone Number: [phone] 12/04/2019, 5:22 PM  Clinical Narrative:                 Spoke with patient's daughter, Boykin Nearing, who is also patient's guardian. PTA patient has been a facility resident at [facility]. She was admitted a week ago after a fall with broken hip, and now admitted with another fall with the other broken hip. Hurshel Keys wants patient to return to [facility]. Spoke with admissions at [facility], before patient can return she needs to be out of restraints and without a sitter for at least 24 hours. FL2 and therapy notes faxed to [facility]. TOC following for transitions.   Expected Discharge Plan: Skilled Nursing Facility Barriers to Discharge: Continued Medical Work up   Patient Goals and CMS Choice Patient states their goals for this hospitalization and ongoing recovery are:: return to [facility] [employer].gov Compare Post Acute Care list provided to:: Patient Represenative (must comment) Choice offered to / list presented to : [place] / Guardian  Expected Discharge Plan and Services Expected Discharge Plan: [employer] In-house Referral: NA Discharge Planning Services: CM Consult Post Acute Care Choice: [place] Living arrangements for the past 2 months: [place]                 DME Arranged: N/A DME Agency: NA       HH Arranged: NA [place] Agency: NA        Prior Living Arrangements/Services Living arrangements for the past 2 months: [place] Lives with:: Facility Resident                   Activities of Daily Living Home Assistive Devices/Equipment: Wheelchair ADL Screening (condition at time of admission) Patient's cognitive ability adequate to safely complete daily  activities?: No Is the patient deaf or have difficulty hearing?: No Does the patient have difficulty seeing, even when wearing glasses/contacts?: Yes Does the patient have difficulty concentrating, remembering, or making decisions?: Yes Patient able to express need for assistance with ADLs?: No Does the patient have difficulty dressing or bathing?: Yes Independently performs ADLs?: No Does the patient have difficulty walking or climbing stairs?: Yes Weakness of Legs: Both Weakness of Arms/Hands: Both  Permission Sought/Granted                  Emotional Assessment         Alcohol / Substance Use: Not Applicable Psych Involvement: No (comment)  Admission diagnosis:  Hip fracture ([place]) [S72.009A] Pain [R52] Closed fracture of left hip, initial encounter ([place]) [S72.002A] Fall, initial encounter [W19.XXXA] Dementia without behavioral disturbance, unspecified dementia type ([place]) [F03.90] Patient Active Problem List   Diagnosis Date Noted  . Hip fracture ([place]) 11/30/2019  . Chronic systolic CHF (congestive heart failure) ([place]) 11/19/2019  . Fracture of femoral neck, right ([place]) 11/18/2019  . Hyperthyroidism 05/18/2019  . Hypokalemia 05/17/2019  . Protein-calorie malnutrition, severe 05/17/2019  . Severe protein-calorie malnutrition ([place]) 05/17/2019  . Palliative care by specialist   . Failure to thrive in adult   . Orthostatic hypotension 06/22/2017  . Chronic combined systolic and diastolic CHF (congestive heart failure) ([place]) 06/22/2017  . Goals of care, counseling/discussion   . Palliative care encounter   .  Pre-syncope 06/21/2017  . Subdural hematoma (HCC) 06/21/2017  . Acute on chronic systolic CHF (congestive heart failure) (HCC) 08/28/2016  . Noncompliance 08/28/2016  . 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 Emphysema of lung (HCC) 06/11/2016  . 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 Dementia (HCC) 06/11/2016  . NSVT (nonsustained ventricular  tachycardia) (HCC) 04/15/2016  . Acute systolic CHF (congestive heart failure) (HCC) 04/15/2016  . Community acquired pneumonia   . CAD S/P remote PCI 08/30/2013  . Hyperlipidemia 08/30/2013  . Tobacco abuse 08/30/2013   PCP:  Bernerd Limbo, MD Pharmacy:   [facility] [address] [place] [address] [place] [address] [place] [address] [place] [address] Phone: [phone] Fax: [phone]     Social Determinants of Health (SDOH) Interventions    Readmission Risk Interventions Readmission Risk Prevention Plan 05/22/2019  Transportation Screening Complete  Home Care Screening Complete  Medication Review (RN CM) Complete  Some recent data might be hidden

## 2019-12-04 NOTE — Plan of Care (Signed)
  Problem: Activity: Goal: Risk for activity intolerance will decrease Outcome: Progressing   Problem: Nutrition: Goal: Adequate nutrition will be maintained Outcome: Progressing   Problem: Education: Goal: Knowledge of General Education information will improve Description: Including pain rating scale, medication(s)/side effects and non-pharmacologic comfort measures Outcome: Not Progressing   Problem: Clinical Measurements: Goal: Ability to maintain clinical measurements within normal limits will improve Outcome: Not Progressing

## 2019-12-04 NOTE — Progress Notes (Signed)
PROGRESS NOTE   Ruth Davidson  XKG:[id number]    DOB: 13-Aug-1941    DOA: 11/30/2019  PCP: Bernerd Limbo, MD   I have briefly reviewed patients previous medical records in [facility].  Chief Complaint:   Chief Complaint  Patient presents with  . 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  Orthopedics consulted, transferred to [facility] and s/p left hip hemiarthroplasty 1/29.  Hospital course complicated by agitated delirium and with inconsistent and poor oral intake.  Assessment & Plan:  Active Problems:   CAD S/P remote PCI   NSVT (nonsustained ventricular tachycardia) (HCC)   Dementia (HCC)   Chronic combined systolic and diastolic CHF (congestive heart failure) (HCC)   Hip fracture (HCC)   Closed left hip fracture: Sustained status post fall at SNF.  Orthopedics/Dr. Dion Saucier consulted and after preop clearance underwent left hip hemiarthroplasty 1/29. Likely osteoporotic fracture which will need further evaluation within 4 weeks as outpatient including vitamin D levels, DEXA scan and consider initiating bisphosphonates.  As per orthopedic follow-up: Weightbearing as tolerated bilateral lower extremities, remains high fall risk and would only get up with assistance, minimize opioids to reduce confusion and continue Lovenox for DVT prophylaxis.  Remains on scheduled Tylenol for pain.  As per PT evaluation 1/31, cognitive impairments interfering with therapies participation.  Postop acute blood loss anemia: EBL 250 mL.  Hemoglobin dropped from 12.6 preop to  9.9.  Hemoglobin improved to 10.2.  Continue to monitor CBC periodically.  Paroxysmal SVT: Noted on telemetry.  Likely asymptomatic.  Continue beta-blockers.  Resolved.  Chronic combined CHF: TTE results as noted above.  Remains clinically compensated despite holding diuretics.  Inconsistent and poor oral intake due to advanced dementia with agitated delirium.  Monitor closely while on gentle IV hydration.  CAD s/p remote PCI: No angina reported.  Continue aspirin, statins and beta-blockers.  Advanced dementia with behavioral abnormalities/agitated delirium: Continue prior home dose of Seroquel. Delirium precautions.  Minimize opioids and sedatives as much as possible.  Ongoing nightly worsening confusion/agitation, expected given sundowning, pain, pain medicine, disturbance of sleep rhythm, unfamiliar surroundings and personnel complicating advanced dementia.  Minimize opioids, sedatives, work on achieving adequate overnight sleep, if have to then consider low-dose Haldol.  As per nursing report, did sleep some last night but had periods of agitation, attempting to get out of bed etc.  Has safety sitter at bedside.  Did get a dose of Haldol yesterday morning.  Increase Seroquel from 25 to 50 mg at bedtime.  May take a while for this to improve.  Daughter plans to come by to see patient today which may be helpful if she stays for a while.  COPD: No clinical bronchospasm.  Hyperlipidemia: Continue statins.  S/p right hip hemiarthroplasty 11/19/2019: Patient was on postop DVT prophylactic dose Lovenox after recent discharge.  Continue Lovenox DVT prophylaxis.  Body mass index is 16.51 kg/m.  Nutritional Status Nutrition Problem: Increased nutrient needs Etiology: chronic illness, post-op healing(combined CHF(EF <20%); pending procedure for left hip fx s/p right hemi-arthroplasty on 1/17) Signs/Symptoms: estimated needs Interventions: Magic cup, Ensure Enlive (each  supplement provides 350kcal and  20 grams of protein)  DVT prophylaxis: SCDs, Lovenox Code Status: DNR Family Communication: None at bedside.  I discussed in detail with patient's daughter via phone on 1/31, updated care and answered questions.  She stated that she plans to come by to visit her mother today.  Updated her regarding delirium and measures being taken.  She verbalized understanding. Disposition:  . Patient came from: SNF           . Anticipated d/c place: SNF . Barriers to d/c: Ongoing agitated delirium, inability to work with therapies yet.   Consultants:   Orthopedics  Procedures:   Left hip hemiarthroplasty 1/29  Antimicrobials:   None   Subjective:  Patient sleeping but arousable.  Oriented to self.  "I am good".  Unable to get more history from her.  Safety sitter at bedside, no agitation since morning.  As per RN report, slept some overnight but then had intermittent agitation, getting out of bed etc.  Able to get to bedside commode with assistance.  Objective:   Vitals:   12/03/19 0426 12/03/19 1932 12/04/19 0326 12/04/19 0740  BP: (!) 135/59 122/71 120/79 128/71  Pulse: 92 91 86 90  Resp:  20 16 16   Temp: 97.7 F (36.5 C) 98.3 F (36.8 C) 97.8 F (36.6 C)   TempSrc: Oral  Oral   SpO2: 100% 98% 99% 97%  Weight:      Height:        General exam: Elderly female, moderately built, thinly nourished lying comfortably supine in bed without distress.  Oral mucosa moist. Respiratory system: Clear to auscultation.  No increased work of breathing. Cardiovascular system: S1 and S2 heard, RRR.  No JVD, murmurs or pedal edema. Gastrointestinal system: Abdomen is nondistended, soft and nontender. No organomegaly or masses felt. Normal bowel sounds heard. Central nervous system: Mental status as noted above.  Follows some simple instructions. No focal neurological deficits. Extremities: Moves both upper extremities well, pulls her sheets when tried to examine.  Bilateral lower extremities at least  2 x 5 power.  Left hip surgical site dressing clean and dry. Skin: No rashes, lesions or ulcers Psychiatry: Judgement and insight impaired. Mood & affect cannot be assessed at this time.     Data Reviewed:   I have personally reviewed following labs and imaging studies   CBC: Recent Labs  Lab 11/30/19 1022 11/30/19 1022 12/01/19 0246 12/02/19 0507 12/03/19 0614  WBC 13.9*   < > 9.7 6.4 7.3  NEUTROABS 12.2*  --   --   --   --   HGB 12.6   < > 12.6 9.9* 10.2*  HCT 40.0   < > 38.3 31.4* 31.1*  MCV 99.3   < > 96.2 99.1 97.8  PLT 294   < > 268 225 258   < > = values in this interval not displayed.    Basic Metabolic Panel: Recent Labs  Lab 12/01/19 0246 12/02/19 0507 12/03/19 0614  NA 142 141 142  K 3.6 4.0 4.1  CL 106 107 110  CO2 24 24 23   GLUCOSE 99 75 91  BUN 17 20 18   CREATININE 0.99 0.83 0.65  CALCIUM 8.5* 7.9* 8.2*    Liver Function Tests: No results for input(s): AST, ALT, ALKPHOS, BILITOT, PROT, ALBUMIN in the last 168 hours.  CBG: No results for input(s): GLUCAP in the last 168 hours.  Microbiology Studies:   Recent Results (from the past 240 hour(s))  Respiratory  Panel by RT PCR (Flu A&B, Covid) - Nasopharyngeal Swab     Status: None   Collection Time: 11/30/19  5:20 AM   Specimen: Nasopharyngeal Swab  Result Value Ref Range Status   SARS Coronavirus 2 by RT PCR NEGATIVE NEGATIVE Final    Comment: (NOTE) SARS-CoV-2 target nucleic acids are NOT DETECTED. The SARS-CoV-2 RNA is generally detectable in upper respiratoy specimens during the acute phase of infection. The lowest concentration of SARS-CoV-2 viral copies this assay can detect is 131 copies/mL. A negative result does not preclude SARS-Cov-2 infection and should not be used as the sole basis for treatment or other patient management decisions. A negative result may occur with  improper specimen collection/handling, submission of specimen other than nasopharyngeal swab, presence of viral  mutation(s) within the areas targeted by this assay, and inadequate number of viral copies (<131 copies/mL). A negative result must be combined with clinical observations, patient history, and epidemiological information. The expected result is Negative. Fact Sheet for Patients:  PinkCheek.be Fact Sheet for Healthcare Providers:  GravelBags.it This test is not yet ap proved or cleared by the [place] FDA and  has been authorized for detection and/or diagnosis of SARS-CoV-2 by FDA under an Emergency Use Authorization (EUA). This EUA will remain  in effect (meaning this test can be used) for the duration of the COVID-19 declaration under Section 564(b)(1) of the Act, 21 U.S.C. section 360bbb-3(b)(1), unless the authorization is terminated or revoked sooner.    Influenza A by PCR NEGATIVE NEGATIVE Final   Influenza B by PCR NEGATIVE NEGATIVE Final    Comment: (NOTE) The Xpert Xpress SARS-CoV-2/FLU/RSV assay is intended as an aid in  the diagnosis of influenza from Nasopharyngeal swab specimens and  should not be used as a sole basis for treatment. Nasal washings and  aspirates are unacceptable for Xpert Xpress SARS-CoV-2/FLU/RSV  testing. Fact Sheet for Patients: PinkCheek.be Fact Sheet for Healthcare Providers: GravelBags.it This test is not yet approved or cleared by the [place] FDA and  has been authorized for detection and/or diagnosis of SARS-CoV-2 by  FDA under an Emergency Use Authorization (EUA). This EUA will remain  in effect (meaning this test can be used) for the duration of the  Covid-19 declaration under Section 564(b)(1) of the Act, 21  U.S.C. section 360bbb-3(b)(1), unless the authorization is  terminated or revoked. Performed at [facility], [address], [place], [place] [address]      Radiology Studies:  No results  found.   Scheduled Meds:   . acetaminophen  1,000 mg Oral TID  . ascorbic acid  500 mg Oral BID  . aspirin EC  81 mg Oral Daily  . atorvastatin  40 mg Oral q1800  . carvedilol  3.125 mg Oral BID WC  . enoxaparin (LOVENOX) injection  40 mg Subcutaneous Q24H  . feeding supplement (ENSURE ENLIVE)  237 mL Oral BID BM  . feeding supplement (PRO-STAT SUGAR FREE 64)  30 mL Oral Daily  . ferrous sulfate  325 mg Oral TID PC  . multivitamin with minerals  1 tablet Oral Daily  . mupirocin ointment  1 application Nasal BID  . QUEtiapine  25 mg Oral QHS  . senna  1 tablet Oral BID  . zinc sulfate  220 mg Oral Daily    Continuous Infusions:   . sodium chloride 50 mL/hr at 12/04/19 0837     LOS: 4 days     Vernell Leep, MD, [place], [facility]. [facility]  To contact the attending provider between 7A-7P or the covering provider during after hours 7P-7A, please log into the web site [email or web address] and access using universal [place] password for that web site. If you do not have the password, please call the hospital operator.  12/04/2019, 9:09 AM

## 2019-12-04 NOTE — Progress Notes (Addendum)
This AM pt refuses to take any medications or be repositioned. When touched for an assessment pt pulled away and stated "dont touch me" and "leave me alone I just want to sleep." Delirium precautions have been taken but pt refuses to stay awake this AM. RN will continue to monitor and sitter in in the room.   1700- pt took some medications with daughter in the room. Pt is unable/unwlling to follow approperiate hip precautions despite attempts at redirection and repositioning

## 2019-12-04 NOTE — [place] FL2 (Signed)
[place] MEDICAID FL2 LEVEL OF CARE SCREENING TOOL     IDENTIFICATION  Patient Name: Ruth Davidson Birthdate: 10/19/1941 Sex: female Admission Date (Current Location): 11/30/2019  [place] and [place] Number:  Aaron Edelman [id number] Facility and Address:  [facility], [address] [place], [place] [address]      Provider Number: [id number]  Attending Physician Name and Address:  Elease Etienne, MD  Relative Name and Phone Number:  Lowella Dandy [phone]    Current Level of Care: Hospital Recommended Level of Care: Skilled Nursing Facility Prior Approval Number:    Date Approved/Denied: 08/14/19 PASRR Number: [id number]  Discharge Plan: SNF    Current Diagnoses: Patient Active Problem List   Diagnosis Date Noted  . Hip fracture (HCC) 11/30/2019  . Chronic systolic CHF (congestive heart failure) (HCC) 11/19/2019  . Fracture of femoral neck, right (HCC) 11/18/2019  . Hyperthyroidism 05/18/2019  . Hypokalemia 05/17/2019  . Protein-calorie malnutrition, severe 05/17/2019  . Severe protein-calorie malnutrition (HCC) 05/17/2019  . Palliative care by specialist   . Failure to thrive in adult   . Orthostatic hypotension 06/22/2017  . Chronic combined systolic and diastolic CHF (congestive heart failure) (HCC) 06/22/2017  . Goals of care, counseling/discussion   . Palliative care encounter   . Pre-syncope 06/21/2017  . Subdural hematoma (HCC) 06/21/2017  . Acute on chronic systolic CHF (congestive heart failure) (HCC) 08/28/2016  . Noncompliance 08/28/2016  . 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 Emphysema of lung (HCC) 06/11/2016  . 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 Dementia (HCC) 06/11/2016  . NSVT (nonsustained ventricular tachycardia) (HCC) 04/15/2016  . Acute systolic CHF (congestive heart failure) (HCC) 04/15/2016  . Community acquired pneumonia   . CAD S/P remote PCI 08/30/2013  . Hyperlipidemia 08/30/2013  .  Tobacco abuse 08/30/2013    Orientation RESPIRATION BLADDER Height & Weight     Self  Normal Incontinent Weight: 45 kg Height:  5\' 5"  (165.1 cm)  BEHAVIORAL SYMPTOMS/MOOD NEUROLOGICAL BOWEL NUTRITION STATUS      Continent Diet  AMBULATORY STATUS COMMUNICATION OF NEEDS Skin   Extensive Assist Verbally Bruising, Other (Comment)(L wrist; MASD to sacrum)                       Personal Care Assistance Level of Assistance  Bathing, Feeding, Dressing Bathing Assistance: Maximum assistance Feeding assistance: Limited assistance Dressing Assistance: Maximum assistance     Functional Limitations Info  Sight, Hearing, Speech Sight Info: Adequate Hearing Info: Adequate Speech Info: Adequate    SPECIAL CARE FACTORS FREQUENCY  PT (By licensed PT), OT (By licensed OT)     PT Frequency: PT at SNF to val and treat a min of 5x/week OT Frequency: OT at SNF to val and treat a min of 5x/week            Contractures Contractures Info: Not present    Additional Factors Info  Code Status, Allergies Code Status Info: DNR Allergies Info: No know drug allergies           Current Medications (12/04/2019):  This is the current hospital active medication list Current Facility-Administered Medications  Medication Dose Route Frequency Provider Last Rate Last Admin  . 0.9 %  sodium chloride infusion   Intravenous Continuous 02/01/2020, MD 50 mL/hr at 12/04/19 1600 Rate Verify at 12/04/19 1600  . acetaminophen (TYLENOL) tablet 1,000 mg  1,000 mg Oral TID 02/01/20, MD   1,000 mg at 12/04/19 1608  .  alum & mag hydroxide-simeth (MAALOX/MYLANTA) 200-200-20 MG/5ML suspension 30 mL  30 mL Oral Q4H PRN Merlene Pulling K, PA-C      . ascorbic acid (VITAMIN C) tablet 500 mg  500 mg Oral BID Merlene Pulling K, PA-C   500 mg at 12/03/19 2139  . aspirin EC tablet 81 mg  81 mg Oral Daily Kathie Dike, MD   81 mg at 12/02/19 1012  . atorvastatin (LIPITOR) tablet 40 mg  40 mg Oral q1800  Kathie Dike, MD   40 mg at 12/03/19 1722  . bisacodyl (DULCOLAX) suppository 10 mg  10 mg Rectal Daily PRN Merlene Pulling K, PA-C      . carvedilol (COREG) tablet 3.125 mg  3.125 mg Oral BID WC Kathie Dike, MD   3.125 mg at 12/04/19 1608  . enoxaparin (LOVENOX) injection 40 mg  40 mg Subcutaneous Q24H Merlene Pulling K, PA-C   40 mg at 12/02/19 1015  . feeding supplement (ENSURE ENLIVE) (ENSURE ENLIVE) liquid 237 mL  237 mL Oral BID BM Kathie Dike, MD   237 mL at 12/04/19 1326  . feeding supplement (PRO-STAT SUGAR FREE 64) liquid 30 mL  30 mL Oral Daily Merlene Pulling K, PA-C   30 mL at 12/02/19 1041  . ferrous sulfate tablet 325 mg  325 mg Oral TID PC Brown, Blaine K, PA-C   325 mg at 12/04/19 1323  . haloperidol lactate (HALDOL) injection 1 mg  1 mg Intravenous Q12H PRN Modena Jansky, MD   1 mg at 12/03/19 1050  . HYDROcodone-acetaminophen (NORCO/VICODIN) 5-325 MG per tablet 1 tablet  1 tablet Oral Q8H PRN Modena Jansky, MD   1 tablet at 12/04/19 1323  . magnesium citrate solution 1 Bottle  1 Bottle Oral Once PRN Merlene Pulling K, PA-C      . menthol-cetylpyridinium (CEPACOL) lozenge 3 mg  1 lozenge Oral PRN Merlene Pulling K, PA-C       Or  . phenol (CHLORASEPTIC) mouth spray 1 spray  1 spray Mouth/Throat PRN Merlene Pulling K, PA-C      . morphine 2 MG/ML injection 0.5 mg  0.5 mg Intravenous Q4H PRN Modena Jansky, MD   0.5 mg at 12/03/19 1049  . multivitamin with minerals tablet 1 tablet  1 tablet Oral Daily Kathie Dike, MD   1 tablet at 12/02/19 1012  . mupirocin ointment (BACTROBAN) 2 % 1 application  1 application Nasal BID Marchia Bond, MD   1 application at 55/73/22 2140  . nitroGLYCERIN (NITROSTAT) SL tablet 0.4 mg  0.4 mg Sublingual Q5 min PRN Merlene Pulling K, PA-C      . ondansetron [facility]) tablet 4 mg  4 mg Oral Q6H PRN Merlene Pulling K, PA-C       Or  . ondansetron [facility]) injection 4 mg  4 mg Intravenous Q6H PRN Merlene Pulling K, PA-C      . polyethylene glycol  (MIRALAX / GLYCOLAX) packet 17 g  17 g Oral Daily PRN Merlene Pulling K, PA-C      . QUEtiapine (SEROQUEL) tablet 25 mg  25 mg Oral QHS Kathie Dike, MD   25 mg at 12/03/19 2139  . senna (SENOKOT) tablet 8.6 mg  1 tablet Oral BID Ventura Bruns, PA-C   8.6 mg at 12/03/19 2139  . zinc sulfate capsule 220 mg  220 mg Oral Daily Ventura Bruns, PA-C   220 mg at 12/02/19 1015     Discharge Medications: Please see discharge summary for  a list of discharge medications.  Relevant Imaging Results:  Relevant Lab Results:   Additional Information SS # [id number]  Bess Kinds, RN

## 2019-12-05 LAB — SARS CORONAVIRUS 2 (TAT 6-24 HRS): SARS Coronavirus 2: NEGATIVE

## 2019-12-05 LAB — BASIC METABOLIC PANEL
Anion gap: 9 (ref 5–15)
BUN: 6 mg/dL — ABNORMAL LOW (ref 8–23)
CO2: 22 mmol/L (ref 22–32)
Calcium: 8 mg/dL — ABNORMAL LOW (ref 8.9–10.3)
Chloride: 109 mmol/L (ref 98–111)
Creatinine, Ser: 0.71 mg/dL (ref 0.44–1.00)
GFR calc Af Amer: >60 mL/min (ref >60)
GFR calc non Af Amer: >60 mL/min (ref >60)
Glucose, Bld: 86 mg/dL (ref 70–99)
Potassium: 3.4 mmol/L — ABNORMAL LOW (ref 3.5–5.1)
Sodium: 140 mmol/L (ref 135–145)

## 2019-12-05 LAB — CBC
HCT: 34.2 % — ABNORMAL LOW (ref 36.0–46.0)
Hemoglobin: 10.7 g/dL — ABNORMAL LOW (ref 12.0–15.0)
MCH: 30.9 pg (ref 26.0–34.0)
MCHC: 31.3 g/dL (ref 30.0–36.0)
MCV: 98.8 fL (ref 80.0–100.0)
Platelets: 294 10*3/uL (ref 150–400)
RBC: 3.46 MIL/uL — ABNORMAL LOW (ref 3.87–5.11)
RDW: 13.3 % (ref 11.5–15.5)
WBC: 6 10*3/uL (ref 4.0–10.5)
nRBC: 0 % (ref 0.0–0.2)

## 2019-12-05 LAB — VITAMIN D 25 HYDROXY (VIT D DEFICIENCY, FRACTURES): Vit D, 25-Hydroxy: 39.36 ng/mL (ref 30–100)

## 2019-12-05 MED ORDER — POTASSIUM CHLORIDE CRYS ER 20 MEQ PO TBCR
40.0000 meq | EXTENDED_RELEASE_TABLET | Freq: Once | ORAL | Status: AC
  Administered 2019-12-05: 40 meq via ORAL
  Filled 2019-12-05 (×2): qty 2

## 2019-12-05 MED ORDER — VITAMIN D 25 MCG (1000 UNIT) PO TABS
1000.0000 [IU] | ORAL_TABLET | Freq: Every day | ORAL | Status: DC
  Administered 2019-12-05 – 2019-12-06 (×2): 1000 [IU] via ORAL
  Filled 2019-12-05 (×2): qty 1

## 2019-12-05 NOTE — Progress Notes (Signed)
     Subjective: 4 Days Post-Op: Left Hemi hip arthroplasty  Patient laying in bed sleeping. Sitter at bedside. Patient pulls sheets back to her when trying to examine her, states "I'm cold". Reports no pain this morning. Patient able to get out of bed with 1 assist help to bedside commode.   Objective:   VITALS:   Vitals:   12/04/19 0326 12/04/19 0740 12/04/19 1541 12/04/19 1934  BP: 120/79 128/71 125/65 123/71  Pulse: 86 90 89 80  Resp: 16 16 17 16   Temp: 97.8 F (36.6 C) 98.1 F (36.7 C) 98.2 F (36.8 C) 98.9 F (37.2 C)  TempSrc: Oral Oral Oral   SpO2: 99% 97% 98% 97%  Weight:      Height:        ABD soft Neurovascular intact Sensation intact distally Intact pulses distally Dorsiflexion/Plantar flexion intact Incision: dressing C/D/I  Left hemi incision appears to be healing well, no signs of infection Observed patient move from laying in bed to standing and sitting on bedside commode with help of walker and 1 nurse assisting from side of bed to bedside commode.   Lab Results  Component Value Date   WBC 7.3 12/03/2019   HGB 10.2 (L) 12/03/2019   HCT 31.1 (L) 12/03/2019   MCV 97.8 12/03/2019   PLT 258 12/03/2019   BMET    Component Value Date/Time   NA 142 12/03/2019 0614   K 4.1 12/03/2019 0614   CL 110 12/03/2019 0614   CO2 23 12/03/2019 0614   GLUCOSE 91 12/03/2019 0614   BUN 18 12/03/2019 0614   CREATININE 0.65 12/03/2019 0614   CREATININE 1.10 (H) 08/20/2017 1247   CALCIUM 8.2 (L) 12/03/2019 0614   GFRNONAA >60 12/03/2019 0614   GFRAA >60 12/03/2019 12/05/2019     Assessment/Plan: 4 Days Post-Op   Active Problems:   CAD S/P remote PCI   NSVT (nonsustained ventricular tachycardia) (HCC)   Dementia (HCC)   Chronic combined systolic and diastolic CHF (congestive heart failure) (HCC)   Hip fracture (HCC)  4 Days Post-Op: Left Hemi hip arthroplasty - Up with therapy - WBAT bilateral lower extremities - remains high fall risk, only get up with  assistance - Surgical dressing in place, history of removing dressings during last admission, can replace with new mepilex if she removes dressing - Lovenox and SCD's for DVT prophylaxis - patient received dose today - Advanced dementia with delirium and agitation - limit opioid pain medication as much as possible, tylenol first line for pain, sitter in place - plan for discharge back to [facility] per daughter's request, per case management patient needs to be without restraints and without sitter for 24 hours prior to discharge.   [facility] 12/05/2019, 6:51 AM   02/02/2020, MD Cell [phone]

## 2019-12-05 NOTE — Progress Notes (Signed)
Pt ate about 50% of breakfast. Drank half of coffee and water. Currently resting comfortably in bed. Sitter reports pt becomes agitated when in pain. Will monitor pain for signs of pain.

## 2019-12-05 NOTE — Progress Notes (Signed)
Physical Therapy Treatment Patient Details Name: Ruth Davidson MRN: [id number] DOB: [birth year] Today's Date: 12/05/2019    History of Present Illness Ruth Davidson is a 79 y.o. female former smoker with history of CAD (MI in 2003, 2006, 2017), HTN, hyperlipidemia, hypothyroidism, COPD, and advanced dementia who complains of left hip pain. She currently resides in the Memory Care Unit at [facility]. Recent history of unwitnessed fall on 11/18/19 with subsequent right hip fracture and right hip hemiarthroplasty performed on 11/19/19 by Dr. Dion Saucier. She was discharged back to [facility] on 1/20. She presented to [facility] ED on 11/30/19 after another unwitnessed fall which occurred on 11/29/19 and was transported to [facility] ED for surgical intervention due to her cardiac history; Now s/p L hip fracture with hemiarthroplasty, posterolateral approach    PT Comments    Continuing work on functional mobility and activity tolerance;   Much less agitation, and able to particiapte today, though needing lots of encouragement; Walked to the sink to wash hands in prep for lunch then to recliner; I'm hopeful that with more consistent schedule for therapies and general routine at [facility], that she will make good progress with mobility  Follow Up Recommendations  SNF;Supervision/Assistance - 24 hour     Equipment Recommendations  Rolling walker with 5" wheels;3in1 (PT)    Recommendations for Other Services       Precautions / Restrictions Precautions Precautions: Posterior Hip;Fall Precaution Booklet Issued: Yes (comment) Precaution Comments: posterior precautions posted in pt's room Restrictions RLE Weight Bearing: Weight bearing as tolerated LLE Weight Bearing: Weight bearing as tolerated    Mobility  Bed Mobility   Bed Mobility: Supine to Sit     Supine to sit: Max assist     General bed mobility comments: Max assist to help LEs off the bed and pull up to sit  Transfers Overall  transfer level: Needs assistance Equipment used: Rolling walker (2 wheeled) Transfers: Sit to/from Stand Sit to Stand: Mod assist;+2 physical assistance;+2 safety/equipment         General transfer comment: Heavy mod assist to stand  Ambulation/Gait Ambulation/Gait assistance: Min assist Gait Distance (Feet): 20 Feet(bed to bathroom then to recliner) Assistive device: Rolling walker (2 wheeled) Gait Pattern/deviations: Decreased step length - right;Decreased step length - left     General Gait Details: Encouragement and cues; initially refusing, but convinced her to get to the bathroom to waxh her hands before lunch; min assist to guard, and gently progress and direct RW to encourage her to take steps    Stairs             Wheelchair Mobility    Modified Rankin (Stroke Patients Only)       Balance                                            Cognition Arousal/Alertness: Awake/alert Behavior During Therapy: Flat affect Overall Cognitive Status: History of cognitive impairments - at baseline                                 General Comments: History of dementia      Exercises      General Comments        Pertinent Vitals/Pain Pain Assessment: Faces Faces Pain Scale: Hurts little more Pain Location: Unspecified,  but makes sense that her hip may be hurting; did not answer when directly asked "does your hip hurt?" Pain Descriptors / Indicators: Grimacing Pain Intervention(s): Monitored during session(and discussed with Sharyn Lull, RN)    Home Living                      Prior Function            PT Goals (current goals can now be found in the care plan section) Acute Rehab PT Goals Patient Stated Goal: "I just want to rest" PT Goal Formulation: Patient unable to participate in goal setting Time For Goal Achievement: 12/17/19 Potential to Achieve Goals: Fair Progress towards PT goals: Progressing toward goals     Frequency    Min 2X/week      PT Plan Current plan remains appropriate    Co-evaluation              AM-PAC PT "6 Clicks" Mobility   Outcome Measure  Help needed turning from your back to your side while in a flat bed without using bedrails?: A Lot Help needed moving from lying on your back to sitting on the side of a flat bed without using bedrails?: A Lot Help needed moving to and from a bed to a chair (including a wheelchair)?: A Lot Help needed standing up from a chair using your arms (e.g., wheelchair or bedside chair)?: A Lot Help needed to walk in hospital room?: A Lot Help needed climbing 3-5 steps with a railing? : Total 6 Click Score: 11    End of Session Equipment Utilized During Treatment: Gait belt Activity Tolerance: Patient tolerated treatment well Patient left: in chair;with call bell/phone within reach;with chair alarm set Nurse Communication: Mobility status;Precautions PT Visit Diagnosis: Unsteadiness on feet (R26.81);Other abnormalities of gait and mobility (R26.89);History of falling (Z91.81);Muscle weakness (generalized) (M62.81);Other symptoms and signs involving the nervous system (R29.898) Pain - Right/Left: Left Pain - part of body: Hip     Time: 9735-3299 PT Time Calculation (min) (ACUTE ONLY): 13 min  Charges:  $Gait Training: 8-22 mins                     Roney Marion, PT  Acute Rehabilitation Services Pager [phone] Office [place] 12/05/2019, 3:15 PM

## 2019-12-05 NOTE — Progress Notes (Signed)
PROGRESS NOTE   BATYA CITRON  NTI:[id number]    DOB: 1940/12/09    DOA: 11/30/2019  PCP: Bernerd Limbo, MD   I have briefly reviewed patients previous medical records in [facility].  Chief Complaint:   Chief Complaint  Patient presents with  . 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  Orthopedics consulted, transferred to [facility] and s/p left hip hemiarthroplasty 1/29.  Hospital course complicated by agitated delirium and with inconsistent and poor oral intake which seems to be improving.   Assessment & Plan:  Active Problems:   CAD S/P remote PCI   NSVT (nonsustained ventricular tachycardia) (HCC)   Dementia (HCC)   Chronic combined systolic and diastolic CHF (congestive heart failure) (HCC)   Hip fracture (HCC)   Closed left hip fracture: Sustained status post fall at SNF.  Orthopedics/Dr. Dion Saucier consulted and after preop clearance underwent left hip hemiarthroplasty 1/29. Likely osteoporotic fracture which will need further evaluation within 4 weeks as outpatient including DEXA scan and consider initiating bisphosphonates.  As per orthopedic follow-up: Weightbearing as tolerated bilateral lower extremities, remains high fall risk and would only get up with assistance, minimize opioids to reduce confusion and continue Lovenox for DVT prophylaxis.  Remains on scheduled Tylenol for pain.  As per PT evaluation 1/31, cognitive impairments interfering with therapies participation.  Pain seems to be better controlled, mental status/agitated delirium  improving, reportedly able to get out of bed to bedside commode with one assist, tolerating oral intake better.  As per TOC note, PTA patient was a resident at [facility] (memory care unit), plan to return to same facility if they are able to provide the level of care needed, needs to be out of restraints and without a sitter for at least 24 hours.  As communicated with nursing, safety sitter discontinued about noontime on 2/2.  Will request COVID-19 testing in preparation for DC back to facility.  Vitamin D level: 39.36/low normal.  Started oral vitamin D supplements.  Postop acute blood loss anemia: EBL 250 mL.  Hemoglobin dropped from 12.6 preop to 9.9.  Hemoglobin stable in the 10 g range for the last 2 days.  Paroxysmal SVT: Noted on telemetry early on in admission.  Likely asymptomatic.  Continue beta-blockers.  Resolved.  Chronic combined CHF: TTE results as noted above.  Remains clinically compensated despite holding diuretics.  Continue to hold diuretics for now while her oral intake is still improving.  CAD s/p remote PCI: No angina reported.  Continue aspirin, statins and beta-blockers.  Advanced dementia with behavioral abnormalities/agitated delirium: Continue prior home dose of Seroquel. Delirium precautions.  Minimize opioids and sedatives as much as possible.  Had predominantly nightly worsening confusion/agitation, expected given sundowning, pain, pain medicine, disturbance of sleep rhythm, unfamiliar surroundings and personnel complicating advanced dementia.  Minimize opioids, sedatives, work on achieving adequate overnight sleep, if have to then consider low-dose Haldol.  Patient appears much better this morning.  Alert, oriented to self, following simple instructions, not irritable or agitated, having regular BMs, tolerated approximately 50% of her meals and reportedly fed herself.  After  discussing with RN and NT, discontinued safety sitter and monitor.  Slowly improving.  COPD: No  clinical bronchospasm.  Hyperlipidemia: Continue statins.  S/p right hip hemiarthroplasty 11/19/2019: Patient was on postop DVT prophylactic dose Lovenox after recent discharge.  Continue Lovenox DVT prophylaxis.  Hypokalemia: Replace and follow.  Body mass index is 16.51 kg/m.  Nutritional Status Nutrition Problem: Increased nutrient needs Etiology: chronic illness, post-op healing(combined CHF(EF <20%); pending procedure for left hip fx s/p right hemi-arthroplasty on 1/17) Signs/Symptoms: estimated needs Interventions: Magic cup, Ensure Enlive (each supplement provides 350kcal and 20 grams of protein)  DVT prophylaxis: SCDs, Lovenox Code Status: DNR Family Communication: None at bedside.  I discussed in detail with patient's daughter via phone on 1/31, updated care and answered questions. Disposition:  . Patient came from: [facility] memory care unit. This is a SNF but patient is a long-term care patient in the memory care unit part.          . Anticipated d/c place: [facility] memory care unit . Barriers to d/c: Pending stability of agitated delirium, off of sitter or restraints for 24 hours beginning today and ability of facility to take her back with required level of care.   Consultants:   Orthopedics  Procedures:   Left hip hemiarthroplasty 1/29  Antimicrobials:   None   Subjective:  Patient interviewed and examined along with RN and safety sitter at bedside.  Patient looks much better.  Alert and oriented to self.  Pleasantly confused but not agitated.  Follows simple instructions.  As per safety sitter, patient seems to get agitated when she is in pain but otherwise she is not agitated.  Objective:   Vitals:   12/04/19 0740 12/04/19 1541 12/04/19 1934 12/05/19 0705  BP: 128/71 125/65 123/71 100/79  Pulse: 90 89 80 (!) 56  Resp: 16 17 16 16   Temp: 98.1 F (36.7 C) 98.2 F (36.8 C) 98.9 F (37.2 C)   TempSrc: Oral Oral    SpO2: 97% 98% 97% 95%  Weight:       Height:        General exam: Elderly female, moderately built, thinly nourished lying comfortably supine in bed without distress.  Oral mucosa moist.  Looks much improved compared to the last couple of days. Respiratory system: Clear to auscultation.  No increased work of breathing. Cardiovascular system: S1 and S2 heard, RRR.  No JVD, murmurs or pedal edema. Gastrointestinal system: Abdomen is nondistended, soft and nontender. No organomegaly or masses felt. Normal bowel sounds heard. Central nervous system: Mental status as noted above.  Follows some simple instructions. No focal neurological deficits. Extremities: Moves both upper extremities well, pulls her sheets when tried to examine.  Bilateral lower extremities at least 3/5 power and able to move better..  Left hip surgical site dressing clean and dry.  Right hip healed surgical scar. Skin: No rashes, lesions or ulcers Psychiatry: Judgement and insight impaired. Mood & affect pleasantly confused.    Data Reviewed:   I have personally reviewed following labs and imaging studies   CBC: Recent Labs  Lab 11/30/19 1022 12/01/19 0246 12/02/19 0507 12/03/19 0614 12/05/19 0720  WBC 13.9*   < > 6.4 7.3 6.0  NEUTROABS 12.2*  --   --   --   --   HGB 12.6   < > 9.9* 10.2* 10.7*  HCT 40.0   < > 31.4* 31.1* 34.2*  MCV 99.3   < > 99.1 97.8 98.8  PLT 294   < >  225 258 294   < > = values in this interval not displayed.    Basic Metabolic Panel: Recent Labs  Lab 12/02/19 0507 12/03/19 0614 12/05/19 0720  NA 141 142 140  K 4.0 4.1 3.4*  CL 107 110 109  CO2 24 23 22   GLUCOSE 75 91 86  BUN 20 18 6*  CREATININE 0.83 0.65 0.71  CALCIUM 7.9* 8.2* 8.0*    Liver Function Tests: No results for input(s): AST, ALT, ALKPHOS, BILITOT, PROT, ALBUMIN in the last 168 hours.  CBG: No results for input(s): GLUCAP in the last 168 hours.  Microbiology Studies:   Recent Results (from the past 240 hour(s))  Respiratory Panel by RT PCR  (Flu A&B, Covid) - Nasopharyngeal Swab     Status: None   Collection Time: 11/30/19  5:20 AM   Specimen: Nasopharyngeal Swab  Result Value Ref Range Status   SARS Coronavirus 2 by RT PCR NEGATIVE NEGATIVE Final    Comment: (NOTE) SARS-CoV-2 target nucleic acids are NOT DETECTED. The SARS-CoV-2 RNA is generally detectable in upper respiratoy specimens during the acute phase of infection. The lowest concentration of SARS-CoV-2 viral copies this assay can detect is 131 copies/mL. A negative result does not preclude SARS-Cov-2 infection and should not be used as the sole basis for treatment or other patient management decisions. A negative result may occur with  improper specimen collection/handling, submission of specimen other than nasopharyngeal swab, presence of viral mutation(s) within the areas targeted by this assay, and inadequate number of viral copies (<131 copies/mL). A negative result must be combined with clinical observations, patient history, and epidemiological information. The expected result is Negative. Fact Sheet for Patients:  PinkCheek.be Fact Sheet for Healthcare Providers:  GravelBags.it This test is not yet ap proved or cleared by the [place] FDA and  has been authorized for detection and/or diagnosis of SARS-CoV-2 by FDA under an Emergency Use Authorization (EUA). This EUA will remain  in effect (meaning this test can be used) for the duration of the COVID-19 declaration under Section 564(b)(1) of the Act, 21 U.S.C. section 360bbb-3(b)(1), unless the authorization is terminated or revoked sooner.    Influenza A by PCR NEGATIVE NEGATIVE Final   Influenza B by PCR NEGATIVE NEGATIVE Final    Comment: (NOTE) The Xpert Xpress SARS-CoV-2/FLU/RSV assay is intended as an aid in  the diagnosis of influenza from Nasopharyngeal swab specimens and  should not be used as a sole basis for treatment. Nasal  washings and  aspirates are unacceptable for Xpert Xpress SARS-CoV-2/FLU/RSV  testing. Fact Sheet for Patients: PinkCheek.be Fact Sheet for Healthcare Providers: GravelBags.it This test is not yet approved or cleared by the [place] FDA and  has been authorized for detection and/or diagnosis of SARS-CoV-2 by  FDA under an Emergency Use Authorization (EUA). This EUA will remain  in effect (meaning this test can be used) for the duration of the  Covid-19 declaration under Section 564(b)(1) of the Act, 21  U.S.C. section 360bbb-3(b)(1), unless the authorization is  terminated or revoked. Performed at [facility], [address] [place], [place] [address]      Radiology Studies:  No results found.   Scheduled Meds:   . acetaminophen  1,000 mg Oral TID  . ascorbic acid  500 mg Oral BID  . aspirin EC  81 mg Oral Daily  . atorvastatin  40 mg Oral q1800  . carvedilol  3.125 mg Oral BID WC  . enoxaparin (LOVENOX) injection  40  mg Subcutaneous Q24H  . feeding supplement (ENSURE ENLIVE)  237 mL Oral BID BM  . feeding supplement (PRO-STAT SUGAR FREE 64)  30 mL Oral Daily  . ferrous sulfate  325 mg Oral TID PC  . multivitamin with minerals  1 tablet Oral Daily  . mupirocin ointment  1 application Nasal BID  . QUEtiapine  25 mg Oral QHS  . senna  1 tablet Oral BID  . zinc sulfate  220 mg Oral Daily    Continuous Infusions:      LOS: 5 days     Marcellus Scott, MD, [place], [facility]. [facility]    To contact the attending provider between 7A-7P or the covering provider during after hours 7P-7A, please log into the web site [email or web address] and access using universal  password for that web site. If you do not have the password, please call the hospital operator.  12/05/2019, 2:29 PM

## 2019-12-05 NOTE — TOC Progression Note (Signed)
Transition of Care [facility]) - Progression Note    Patient Details  Name: Ruth Davidson MRN: [id number] Date of Birth: 1941/01/09  Transition of Care [facility] CM/SW Contact  Bess Kinds, RN Phone Number: [phone] 12/05/2019, 2:52 PM  Clinical Narrative:    Notified by MD that patient is now without a sitter and no restraints. Patient is medically ready to transition back to SNF, but needs to be restraint-free and sitter-free for at least 24 hours. Patient will need covid test prior to discharge. TOC team following for transition needs.    Expected Discharge Plan: Skilled Nursing Facility Barriers to Discharge: Continued Medical Work up  Expected Discharge Plan and Services Expected Discharge Plan: Skilled Nursing Facility In-house Referral: NA Discharge Planning Services: CM Consult Post Acute Care Choice: Skilled Nursing Facility Living arrangements for the past 2 months: Skilled Nursing Facility                 DME Arranged: N/A DME Agency: NA       HH Arranged: NA HH Agency: NA         Social Determinants of Health (SDOH) Interventions    Readmission Risk Interventions Readmission Risk Prevention Plan 05/22/2019  Transportation Screening Complete  Home Care Screening Complete  Medication Review (RN CM) Complete  Some recent data might be hidden

## 2019-12-05 NOTE — Progress Notes (Signed)
Sitter order d/c'd.

## 2019-12-05 NOTE — Plan of Care (Signed)
  Problem: Activity: Goal: Risk for activity intolerance will decrease Outcome: Progressing   Problem: Coping: Goal: Level of anxiety will decrease Outcome: Progressing   Problem: Pain Managment: Goal: General experience of comfort will improve Outcome: Progressing   Problem: Safety: Goal: Ability to remain free from injury will improve Outcome: Progressing   Problem: Skin Integrity: Goal: Risk for impaired skin integrity will decrease Outcome: Progressing   

## 2019-12-06 DIAGNOSIS — S72002A Fracture of unspecified part of neck of left femur, initial encounter for closed fracture: Secondary | ICD-10-CM | POA: Diagnosis not present

## 2019-12-06 DIAGNOSIS — Z20822 Contact with and (suspected) exposure to covid-19: Secondary | ICD-10-CM | POA: Diagnosis not present

## 2019-12-06 DIAGNOSIS — S72001D Fracture of unspecified part of neck of right femur, subsequent encounter for closed fracture with routine healing: Secondary | ICD-10-CM | POA: Diagnosis not present

## 2019-12-06 DIAGNOSIS — S72042D Displaced fracture of base of neck of left femur, subsequent encounter for closed fracture with routine healing: Secondary | ICD-10-CM | POA: Diagnosis not present

## 2019-12-06 DIAGNOSIS — I2510 Atherosclerotic heart disease of native coronary artery without angina pectoris: Secondary | ICD-10-CM | POA: Diagnosis not present

## 2019-12-06 DIAGNOSIS — E43 Unspecified severe protein-calorie malnutrition: Secondary | ICD-10-CM | POA: Diagnosis not present

## 2019-12-06 DIAGNOSIS — E559 Vitamin D deficiency, unspecified: Secondary | ICD-10-CM | POA: Diagnosis not present

## 2019-12-06 DIAGNOSIS — I5042 Chronic combined systolic (congestive) and diastolic (congestive) heart failure: Secondary | ICD-10-CM | POA: Diagnosis not present

## 2019-12-06 DIAGNOSIS — Z7401 Bed confinement status: Secondary | ICD-10-CM | POA: Diagnosis not present

## 2019-12-06 DIAGNOSIS — W19XXXA Unspecified fall, initial encounter: Secondary | ICD-10-CM | POA: Diagnosis not present

## 2019-12-06 DIAGNOSIS — F0391 Unspecified dementia with behavioral disturbance: Secondary | ICD-10-CM | POA: Diagnosis not present

## 2019-12-06 DIAGNOSIS — E876 Hypokalemia: Secondary | ICD-10-CM | POA: Diagnosis not present

## 2019-12-06 DIAGNOSIS — Z1152 Encounter for screening for COVID-19: Secondary | ICD-10-CM | POA: Diagnosis not present

## 2019-12-06 DIAGNOSIS — F0390 Unspecified dementia without behavioral disturbance: Secondary | ICD-10-CM | POA: Diagnosis not present

## 2019-12-06 DIAGNOSIS — I959 Hypotension, unspecified: Secondary | ICD-10-CM | POA: Diagnosis not present

## 2019-12-06 DIAGNOSIS — J449 Chronic obstructive pulmonary disease, unspecified: Secondary | ICD-10-CM | POA: Diagnosis not present

## 2019-12-06 DIAGNOSIS — G4700 Insomnia, unspecified: Secondary | ICD-10-CM | POA: Diagnosis not present

## 2019-12-06 DIAGNOSIS — S72002D Fracture of unspecified part of neck of left femur, subsequent encounter for closed fracture with routine healing: Secondary | ICD-10-CM | POA: Diagnosis not present

## 2019-12-06 DIAGNOSIS — D62 Acute posthemorrhagic anemia: Secondary | ICD-10-CM | POA: Diagnosis not present

## 2019-12-06 DIAGNOSIS — M2550 Pain in unspecified joint: Secondary | ICD-10-CM | POA: Diagnosis not present

## 2019-12-06 DIAGNOSIS — E785 Hyperlipidemia, unspecified: Secondary | ICD-10-CM | POA: Diagnosis not present

## 2019-12-06 MED ORDER — ENOXAPARIN SODIUM 30 MG/0.3ML ~~LOC~~ SOLN: 30.0000 mg | SUBCUTANEOUS | 0 refills | Status: DC

## 2019-12-06 NOTE — Progress Notes (Addendum)
Attempted report x 2 at [facility] with no answer.   Report given to Crystal at [facility].

## 2019-12-06 NOTE — TOC Transition Note (Signed)
Transition of Care [facility]) - CM/SW Discharge Note   Patient Details  Name: Ruth Davidson MRN: [id number] Date of Birth: 1940/11/04  Transition of Care [facility]) CM/SW Contact:  Bess Kinds, RN Phone Number: [phone] 12/06/2019, 11:28 AM   Clinical Narrative:    Luan Moore in admissions at [facility] that patient had discharge orders and summary. Verified faxed documents received. Patient will be 24 hours without sitter at noon. Covid negative yesterday. Carollee Herter to initiate authorization and anticipates that patient will be able to transition this afternoon. TOC team following.    Final next level of care: Skilled Nursing Facility Barriers to Discharge: No Barriers Identified   Patient Goals and CMS Choice Patient states their goals for this hospitalization and ongoing recovery are:: return to [email or web address].gov Compare Post Acute Care list provided to:: Patient Represenative (must comment) Choice offered to / list presented to : [facility] POA / Guardian  Discharge Placement   Existing PASRR number confirmed : 12/04/19          Patient chooses bed at: [facility] Patient to be transferred to facility by: [facility] Name of family member notified: Dondra Prader Patient and family notified of of transfer: 12/06/19  Discharge Plan and Services In-house Referral: NA Discharge Planning Services: CM Consult Post Acute Care Choice: Skilled Nursing Facility          DME Arranged: N/A DME Agency: NA       HH Arranged: NA HH Agency: NA        Social Determinants of Health (SDOH) Interventions     Readmission Risk Interventions Readmission Risk Prevention Plan 05/22/2019  Transportation Screening Complete  Home Care Screening Complete  Medication Review (RN CM) Complete  Some recent data might be hidden

## 2019-12-06 NOTE — Discharge Summary (Addendum)
Physician Discharge Summary  Ruth Davidson ZOX:[id number] DOB: 01/06/1941 DOA: 11/30/2019  PCP: Bernerd LimboAriza, Fernando Enrique, MD  Admit date: 11/30/2019 Discharge date: 12/06/2019 Admitted From: SNF Disposition:  SNF  Recommendations for Outpatient Follow-up:  1. Follow up with PCP in 1-2 weeks 2. Please obtain BMP/CBC in one week 3. Please follow up on the following pending results:  Home Health: No Equipment/Devices:Rollling Walker with 5" wheels, 3 in 1    Discharge Condition: Fair CODE STATUS: DNR Diet recommendation: Low sodium  Brief/Interim Summary: [facility] transfer 11/30/2019: 79 year old female, from SNF, PMH of CAD s/p PCI 2003, chronic combined CHF with LVEF <20% and grade 1 diastolic dysfunction by TTE 11/18/2019, advanced dementia, COPD, HLD, recent hospitalization 11/17/2019-11/23/2019 for right femoral neck fracture and s/p right hip hemiarthroplasty 11/19/2019 which she tolerated well, return to ED following a fall at SNF day prior to admission and left hip fracture.  Orthopedics consulted, [facility] and s/p left hip hemiarthroplasty 1/29.  Hospital course complicated by agitated delirium and with inconsistent and poor oral intake which seems to be improving.   Discharge Diagnoses:  Active Problems:   CAD S/P remote PCI   NSVT (nonsustained ventricular tachycardia) (HCC)   Dementia (HCC)   Chronic combined systolic and diastolic CHF (congestive heart failure) (HCC)   Moderate malnutrition (HCC)   Hip fracture (HCC)  Closed left hip fracture: Sustained status post fall at SNF.  Orthopedics/Dr. Dion SaucierLandau consulted and after preop clearance underwent left hip hemiarthroplasty 1/29. Likely osteoporotic fracture which will need further evaluation within 4 weeks as outpatient including DEXA scan and consider initiating bisphosphonates.  As per orthopedic follow-up: Weightbearing as tolerated bilateral lower extremities, remains high fall risk and would only get up  with assistance, minimize opioids to reduce confusion and continue Lovenox for DVT prophylaxis.  Remains on scheduled Tylenol for pain.  As per PT evaluation 1/31, cognitive impairments interfering with therapies participation.  Pain seems to be better controlled, mental status/agitated delirium improving, reportedly able to get out of bed to bedside commode with one assist, tolerating oral intake better.  As per TOC note, [facility] plan to return to same facility if they are able to provide the level of care needed, needs to be out of restraints and without a sitter for at least 24 hours.  As communicated with nursing, safety sitter discontinued about noontime on 2/2.  Will request COVID-19 testing in preparation for DC back to facility.  Vitamin D level: 39.36/low normal.  Started oral vitamin D supplements.  Postop acute blood loss anemia: EBL 250 mL.  Hemoglobin dropped from 12.6 preop to 9.9.  Hemoglobin stable in the 10 g range for the last 3 days.  Paroxysmal SVT: Noted on telemetry early on in admission.  Likely asymptomatic.  Continue beta-blockers.  Resolved.  Chronic combined CHF: TTE results as noted above.  Remains clinically compensated despite holding diuretics.  Continue to hold diuretics for now while her oral intake is still improving.  CAD s/p remote PCI: No angina reported.  Continue aspirin, statins and beta-blockers.  Advanced dementia with behavioral abnormalities/agitated delirium: Continue prior home dose of Seroquel. Delirium precautions.  Minimize opioids and sedatives as much as possible.  Had predominantly nightly worsening confusion/agitation, expected given sundowning, pain, pain medicine, disturbance of sleep rhythm, unfamiliar surroundings and personnel complicating advanced dementia.  Minimize opioids, sedatives, work on achieving adequate overnight sleep, if have to then consider low-dose Haldol.  Patient  appears  much better this morning.  Alert, oriented to self, following simple instructions, not irritable or agitated, having regular BMs, tolerated approximately 50% of her meals and reportedly fed herself.  After discussing with RN and NT, discontinued safety sitter and monitor.  Slowly improving.  COPD: No clinical bronchospasm.  Hyperlipidemia: Continue statins.  S/p right hip hemiarthroplasty 11/19/2019: Patient was on postop DVT prophylactic dose Lovenox after recent discharge.  Continue Lovenox DVT prophylaxis.  Hypokalemia: Replace and follow.  Body mass index is 16.51 kg/m.  Nutritional Status Nutrition Problem: Increased nutrient needs Etiology: chronic illness, post-op healing(combined CHF(EF <20%); pending procedure for left hip fx s/p right hemi-arthroplasty on 1/17) Signs/Symptoms: estimated needs Interventions: Magic cup, Ensure Enlive (each supplement provides 350kcal and 20 grams of protein)  Discharge Instructions:  Discharge Instructions    Call MD for:  persistant nausea and vomiting   Complete by: As directed    Call MD for:  redness, tenderness, or signs of infection (pain, swelling, redness, odor or green/yellow discharge around incision site)   Complete by: As directed    Call MD for:  severe uncontrolled pain   Complete by: As directed    Call MD for:  temperature >100.4   Complete by: As directed    Diet - low sodium heart healthy   Complete by: As directed    Increase activity slowly   Complete by: As directed      Allergies as of 12/06/2019   No Known Allergies     Medication List    TAKE these medications   ascorbic acid 500 MG tablet Commonly known as: VITAMIN C Take 500 mg by mouth 2 (two) times daily.   aspirin EC 81 MG tablet Take 81 mg by mouth daily.   atorvastatin 40 MG tablet Commonly known as: LIPITOR Take 1 tablet (40 mg total) by mouth daily at 6 PM.   bisacodyl 10 MG suppository Commonly known as: DULCOLAX Place 1 suppository  (10 mg total) rectally daily as needed for moderate constipation.   carvedilol 3.125 MG tablet Commonly known as: COREG Take 1 tablet (3.125 mg total) by mouth 2 (two) times daily with a meal.   Cholecalciferol 50 MCG (2000 UT) Caps Take 1 capsule by mouth daily.   docusate sodium 100 MG capsule Commonly known as: COLACE Take 1 capsule (100 mg total) by mouth 2 (two) times daily.   enoxaparin 30 MG/0.3ML injection Commonly known as: Lovenox Inject 0.3 mLs (30 mg total) into the skin daily. What changed: Another medication with the same name was added. Make sure you understand how and when to take each.   enoxaparin 30 MG/0.3ML injection Commonly known as: Lovenox Inject 0.3 mLs (30 mg total) into the skin daily. What changed: You were already taking a medication with the same name, and this prescription was added. Make sure you understand how and when to take each.   feeding supplement (ENSURE ENLIVE) Liqd Take 237 mLs by mouth 2 (two) times daily between meals.   feeding supplement (PRO-STAT SUGAR FREE 64) Liqd Take 30 mLs by mouth daily.   furosemide 20 MG tablet Commonly known as: LASIX Take 1 tablet (20 mg total) by mouth every other day.   HYDROcodone-acetaminophen 5-325 MG tablet Commonly known as: Norco Take 1 tablet by mouth every 6 (six) hours as needed for severe pain.   Melatonin 3 MG Tabs Take 1 tablet by mouth at bedtime.   multivitamin with minerals Tabs tablet Take 1 tablet by mouth  daily.   nitroGLYCERIN 0.4 MG SL tablet Commonly known as: NITROSTAT Place 1 tablet (0.4 mg total) under the tongue every 5 (five) minutes as needed for chest pain (or back pain or arm pain).   potassium chloride 10 MEQ tablet Commonly known as: KLOR-CON Take 1 tablet (10 mEq total) by mouth every other day. What changed: when to take this   QUEtiapine 25 MG tablet Commonly known as: SEROQUEL Take 1 tablet (25 mg total) by mouth at bedtime for 14 days.   senna 8.6 MG  Tabs tablet Commonly known as: SENOKOT Take 1 tablet by mouth 2 (two) times daily.   zinc sulfate 220 (50 Zn) MG capsule Take 220 mg by mouth daily.       Contact information for follow-up providers    Teryl LucyLandau, Joshua, MD In 2 weeks.   Specialty: Orthopedic Surgery Contact information: [address] [place] [address] [phone]        Bernerd LimboAriza, Fernando Enrique, MD Follow up.   Specialty: Internal Medicine Contact information: [address] [place] [address] [phone]            Contact information for after-discharge care    Destination    [facility] .   Service: Skilled Nursing Contact information: [address] [place] [id number] 737 [phone]                 No Known Allergies  Consultations:  Orthopedics   Procedures/Studies:  Left hip hemiarthroplasty 01/29 DG Chest 1 View  Result Date: 11/17/2019 CLINICAL DATA:  Recent right hip fracture EXAM: CHEST  1 VIEW COMPARISON:  05/06/2019 FINDINGS: Cardiac shadow is stable and accentuated by the frontal technique. The lungs are clear. No acute bony abnormality is seen. IMPRESSION: No acute abnormality noted. Electronically Signed   By: Alcide CleverMark  Lukens M.D.   On: 11/17/2019 23:32   Pelvis Portable  Result Date: 12/01/2019 CLINICAL DATA:  Post left hip hemiarthroplasty. EXAM: PORTABLE PELVIS 1-2 VIEWS COMPARISON:  Radiograph 11/17/2019, 11/18/2018 FINDINGS: There are expected postsurgical soft tissue changes from recent left hip hemiarthroplasty. A recent right hip hemiarthroplasty is noted as well. No periprosthetic fracture or hardware complications are seen in either hip. Remaining bones of the pelvis are intact. Expected postsurgical soft tissue changes noted in the left hip including intra-articular gas. IMPRESSION: Expected postsurgical changes from recent left hip hemiarthroplasty. No periprosthetic fracture or hardware complications. Electronically Signed    By: Kreg ShropshirePrice  DeHay M.D.   On: 12/01/2019 19:16   Pelvis Portable  Result Date: 11/19/2019 CLINICAL DATA:  Postop, PACU EXAM: PORTABLE PELVIS 1-2 VIEWS COMPARISON:  Radiograph 11/17/2019 FINDINGS: Patient is post right hip hemiarthroplasty with expected postoperative soft tissue changes including subcutaneous and intra-articular gas of the right hip. Prosthesis is in normal, expected alignment without periprosthetic fracture or other acute complication. Mild degenerative changes. Remaining bones of the pelvis are intact. IMPRESSION: Status post right hip hemiarthroplasty with expected postoperative soft tissue changes. No periprosthetic fracture or other acute complication. Electronically Signed   By: Kreg ShropshirePrice  DeHay M.D.   On: 11/19/2019 17:33   DG HIP UNILAT WITH PELVIS 2-3 VIEWS LEFT  Result Date: 11/30/2019 CLINICAL DATA:  Fall with left hip pain EXAM: DG HIP (WITH OR WITHOUT PELVIS) 2-3V LEFT COMPARISON:  None. FINDINGS: Displaced femoral neck fracture. No notable degenerative changes at the left hip. There has been a right hip hemiarthroplasty. No evidence of pelvic ring fracture or diastasis. Osteopenia. IMPRESSION: Displaced femoral neck fracture. Electronically  Signed   By: Marnee Spring M.D.   On: 11/30/2019 05:02   DG Hip Unilat  With Pelvis 2-3 Views Right  Result Date: 11/17/2019 CLINICAL DATA:  Unwitnessed fall. EXAM: DG HIP (WITH OR WITHOUT PELVIS) 2-3V RIGHT COMPARISON:  None. FINDINGS: There is a right femoral neck fracture with varus angulation. SI joints are symmetric. No subluxation or dislocation. IMPRESSION: Right femoral neck fracture with varus angulation. Electronically Signed   By: Charlett Nose M.D.   On: 11/17/2019 23:27   ECHOCARDIOGRAM LIMITED  Result Date: 11/18/2019   ECHOCARDIOGRAM LIMITED REPORT   Patient Name:   Ruth Davidson Date of Exam: 11/18/2019 Medical Rec #:  [id number]      Height:       63.0 in Accession #:    [id number]     Weight:       89.9 lb Date of Birth:   1941/08/04      BSA:          1.38 m Patient Age:    78 years       BP:           100/66 mmHg Patient Gender: F              HR:           111 bpm. [facility]  Procedure: Limited Echo and Color Doppler Indications:    Abnormal EKG  History:        Patient has prior history of Echocardiogram examinations, most                 recent 06/22/2017. Right hip fracture.  Sonographer:    Lavenia Atlas RDCS Referring Phys: [phone] [facility]  Sonographer Comments: Image acquisition challenging due to uncooperative patient and pain in R hip. IMPRESSIONS  1. Left ventricular ejection fraction, by visual estimation, is <20%. The left ventricle has severely decreased function. There is no increased left ventricular wall thickness.  2. Left ventricular diastolic parameters are consistent with Grade I diastolic dysfunction (impaired relaxation).  3. The left ventricle demonstrates global hypokinesis.  4. Mild to moderate mitral annular calcification.  5. The mitral valve is grossly normal. Trivial mitral valve regurgitation.  6. No significant change from prior study. FINDINGS  Left Ventricle: Left ventricular ejection fraction, by visual estimation, is <20%. The left ventricle has severely decreased function. The left ventricle demonstrates global hypokinesis. There is no increased left ventricular wall thickness. Left ventricular diastolic parameters are consistent with Grade I diastolic dysfunction (impaired relaxation). Indeterminate filling pressures. Mitral Valve: The mitral valve is grossly normal. Mild to moderate mitral annular calcification. Trivial mitral valve regurgitation.  LEFT VENTRICLE          Normals PLAX 2D LVIDd:         4.19 cm  3.6 cm   Diastology               Normals LVIDs:         3.86 cm  1.7 cm   LV e' lateral: 4.03 cm/s 6.42 cm/s LV PW:         1.04 cm  1.4 cm   LV e' medial:  2.39 cm/s 6.96 cm/s LV IVS:        0.89 cm  1.3 cm LVOT diam:     1.80 cm  2.0 cm LV SV:         14 ml     79 ml LV SV Index:  10.37    45 ml/m2 LVOT Area:     2.54 cm 3.14 cm2  LEFT ATRIUM         Index LA diam:    3.20 cm 2.32 cm/m   AORTA                 Normals Ao Root diam: 2.40 cm 31 mm  SHUNTS Systemic Diam: 1.80 cm  Zoila Shutter MD Electronically signed by Zoila Shutter MD Signature Date/Time: 11/18/2019/3:52:27 PMThe mitral valve is grossly normal.    Final     Subjective: Looks better. Reports pain. She has eaten partial breakfast. She is confused, but following simple instructions. No agitation.  Discharge Exam: Vitals:   12/06/19 0833 12/06/19 1120  BP: 132/72 (!) 103/51  Pulse: (!) 111 69  Resp: 17   Temp: 98 F (36.7 C)   SpO2: 98%    Vitals:   12/05/19 2007 12/06/19 0300 12/06/19 0833 12/06/19 1120  BP:  (!) 114/50 132/72 (!) 103/51  Pulse:  78 (!) 111 69  Resp: 18 16 17    Temp: 98.9 F (37.2 C) 98.7 F (37.1 C) 98 F (36.7 C)   TempSrc:  Oral Oral   SpO2:  100% 98%   Weight:      Height:       General: Pt is alert, thin, elderly, no distress Cardiovascular: RRR Respiratory: normal effort Abdominal: Soft, NT, ND Extremities: moving all well, in bed  The results of significant diagnostics from this hospitalization (including imaging, microbiology, ancillary and laboratory) are listed below for reference.     Microbiology: Recent Results (from the past 240 hour(s))  SARS CORONAVIRUS 2 (TAT 6-24 HRS) Nasopharyngeal Nasopharyngeal Swab     Status: None   Collection Time: 12/05/19  6:18 PM   Specimen: Nasopharyngeal Swab  Result Value Ref Range Status   SARS Coronavirus 2 NEGATIVE NEGATIVE Final    Comment: (NOTE) SARS-CoV-2 target nucleic acids are NOT DETECTED. The SARS-CoV-2 RNA is generally detectable in upper and lower respiratory specimens during the acute phase of infection. Negative results do not preclude SARS-CoV-2 infection, do not rule out co-infections with other pathogens, and should not be used as the sole basis for treatment or other  patient management decisions. Negative results must be combined with clinical observations, patient history, and epidemiological information. The expected result is Negative. Fact Sheet for Patients: 02/02/20 Fact Sheet for Healthcare Providers: HairSlick.no This test is not yet approved or cleared by the quierodirigir.com FDA and  has been authorized for detection and/or diagnosis of SARS-CoV-2 by FDA under an Emergency Use Authorization (EUA). This EUA will remain  in effect (meaning this test can be used) for the duration of the COVID-19 declaration under Section 56 4(b)(1) of the Act, 21 U.S.C. section 360bbb-3(b)(1), unless the authorization is terminated or revoked sooner. Performed at [facility], [address], [place], [place]      Labs: BNP (last 3 results) Recent Labs    05/16/19 2232  BNP 313.0*   Basic Metabolic Panel: Recent Labs  Lab 12/05/19 0720  NA 140  K 3.4*  CL 109  CO2 22  GLUCOSE 86  BUN 6*  CREATININE 0.71  CALCIUM 8.0*   CBC: Recent Labs  Lab 12/05/19 0720  WBC 6.0  HGB 10.7*  HCT 34.2*  MCV 98.8  PLT 294   Urinalysis    Component Value Date/Time   COLORURINE YELLOW 11/19/2019 1718   APPEARANCEUR CLEAR 11/19/2019 1718  LABSPEC 1.030 11/19/2019 1718   PHURINE 5.0 11/19/2019 1718   GLUCOSEU NEGATIVE 11/19/2019 1718   HGBUR MODERATE (A) 11/19/2019 1718   BILIRUBINUR NEGATIVE 11/19/2019 1718   KETONESUR 20 (A) 11/19/2019 1718   PROTEINUR NEGATIVE 11/19/2019 1718   NITRITE NEGATIVE 11/19/2019 1718   LEUKOCYTESUR SMALL (A) 11/19/2019 1718   Sepsis Labs Microbiology Recent Results (from the past 240 hour(s))  SARS CORONAVIRUS 2 (TAT 6-24 HRS) Nasopharyngeal Nasopharyngeal Swab     Status: None   Collection Time: 12/05/19  6:18 PM   Specimen: Nasopharyngeal Swab  Result Value Ref Range Status   SARS Coronavirus 2 NEGATIVE NEGATIVE Final    Comment:  (NOTE) SARS-CoV-2 target nucleic acids are NOT DETECTED. The SARS-CoV-2 RNA is generally detectable in upper and lower respiratory specimens during the acute phase of infection. Negative results do not preclude SARS-CoV-2 infection, do not rule out co-infections with other pathogens, and should not be used as the sole basis for treatment or other patient management decisions. Negative results must be combined with clinical observations, patient history, and epidemiological information. The expected result is Negative. Fact Sheet for Patients: SugarRoll.be Fact Sheet for Healthcare Providers: https://www.woods-mathews.com/ This test is not yet approved or cleared by the [place] FDA and  has been authorized for detection and/or diagnosis of SARS-CoV-2 by FDA under an Emergency Use Authorization (EUA). This EUA will remain  in effect (meaning this test can be used) for the duration of the COVID-19 declaration under Section 56 4(b)(1) of the Act, 21 U.S.C. section 360bbb-3(b)(1), unless the authorization is terminated or revoked sooner. Performed at [facility], [place] [address] [place], [place] [address]      Time coordinating discharge: Over 30 minutes  SIGNED:   Donnamae Jude, MD  [facility] 12/11/2019, 7:57 AM  If 7PM-7AM, please contact night-coverage

## 2019-12-06 NOTE — Care Management Important Message (Signed)
Important Message  Patient Details  Name: Ruth Davidson MRN: [id number] Date of Birth: Sep 07, 1941   Medicare Important Message Given:  Yes     Mardene Sayer 12/06/2019, 12:35 PM

## 2019-12-06 NOTE — Progress Notes (Signed)
     Subjective: 5 Days Post-Op: Left Hemi hip arthroplasty Patient laying comfortably in bed this morning. Patient reports pain in right hip to be moderate.   Objective:   VITALS:   Vitals:   12/05/19 0705 12/05/19 1606 12/05/19 2007 12/06/19 0300  BP: 100/79 (!) 110/46  (!) 114/50  Pulse: (!) 56 93  78  Resp: 16 15 18 16   Temp:   98.9 F (37.2 C) 98.7 F (37.1 C)  TempSrc:    Oral  SpO2: 95% 98%  100%  Weight:      Height:        ABD soft Neurovascular intact Sensation intact distally Intact pulses distally Dorsiflexion/Plantar flexion intact Incision: dressing C/D/I  Lab Results  Component Value Date   WBC 6.0 12/05/2019   HGB 10.7 (L) 12/05/2019   HCT 34.2 (L) 12/05/2019   MCV 98.8 12/05/2019   PLT 294 12/05/2019   BMET    Component Value Date/Time   NA 140 12/05/2019 0720   K 3.4 (L) 12/05/2019 0720   CL 109 12/05/2019 0720   CO2 22 12/05/2019 0720   GLUCOSE 86 12/05/2019 0720   BUN 6 (L) 12/05/2019 0720   CREATININE 0.71 12/05/2019 0720   CREATININE 1.10 (H) 08/20/2017 1247   CALCIUM 8.0 (L) 12/05/2019 0720   GFRNONAA >60 12/05/2019 0720   GFRAA >60 12/05/2019 0720     Assessment/Plan: 5 Days Post-Op   Active Problems:   CAD S/P remote PCI   NSVT (nonsustained ventricular tachycardia) (HCC)   Dementia (HCC)   Chronic combined systolic and diastolic CHF (congestive heart failure) (HCC)   Hip fracture (HCC)  5 Days Post-Op: Left Hemi hip arthroplasty -Up with therapy, WBAT bilateral lower extremities  - Surgical dressing in place, history of removing dressings during last admission, can replace with new mepilex if she removes dressing though it remains intact today - Lovenox and SCD's for DVT prophylaxis - Advanced dementia with delirium and agitation - limit opioid pain medication as much as possible, tylenol first line for pain, Sitter discontinued at noon yesterday.  - plan for discharge back to [facility] per daughter's request, as  of noon today patient will be without restraints and sitter for over 24 hours. Ok to discharge back to SNF from orthopedic standpoint, updated COVID test ordered and resulted negative on 12/05/19.   02/02/20 12/06/2019, 8:14 AM   02/03/2020, MD Cell [phone]

## 2019-12-06 NOTE — Progress Notes (Signed)
Physical Therapy Treatment Patient Details Name: Ruth Davidson MRN: [id number] DOB: 11-21-1940 Today's Date: 12/06/2019    History of Present Illness Ruth Davidson is a 79 y.o. female former smoker with history of CAD (MI in 2003, 2006, 2017), HTN, hyperlipidemia, hypothyroidism, COPD, and advanced dementia who complains of left hip pain. She currently resides in the Memory Care Unit at [facility]. Recent history of unwitnessed fall on 11/18/19 with subsequent right hip fracture and right hip hemiarthroplasty performed on 11/19/19 by Dr. Dion Saucier. She was discharged back to [facility] on 1/20. She presented to [facility] ED on 11/30/19 after another unwitnessed fall which occurred on 11/29/19 and was transported to [facility] ED for surgical intervention due to her cardiac history; Now s/p L hip fracture with hemiarthroplasty, posterolateral approach    PT Comments    Continuing work on functional mobility and activity tolerance;  Initially refusing to get up, but about 5-7 minutes after initial refusal, her bed alarm was going off, and she was sitting EOB, telling us she needs to go to the bathroom; still needs mod assist to power up to stand, but good use of RW for amb; Attempted to remind/cue re: Post Hip Prec, and pt says, "why are you doing me like that?"; Gentle tactile cues for best positioning with respect to post hip prec throughout session   Follow Up Recommendations  SNF;Supervision/Assistance - 24 hour     Equipment Recommendations  Rolling walker with 5" wheels;3in1 (PT)    Recommendations for Other Services       Precautions / Restrictions Precautions Precautions: Posterior Hip;Fall Precaution Booklet Issued: Yes (comment) Precaution Comments: posterior precautions posted in pt's room Restrictions Weight Bearing Restrictions: Yes RLE Weight Bearing: Weight bearing as tolerated LLE Weight Bearing: Weight bearing as tolerated    Mobility  Bed Mobility                General bed mobility comments: Had gotten herself up to EOB (between bedrail and foot board -- bed alarm was going off) upon my arrival  Transfers Overall transfer level: Needs assistance Equipment used: Rolling walker (2 wheeled) Transfers: Sit to/from Stand Sit to Stand: Mod assist         General transfer comment: Heavy mod assist to stand; cues to put hands on RW  Ambulation/Gait Ambulation/Gait assistance: Min assist Gait Distance (Feet): 20 Feet(bed to bathroom and back to bed) Assistive device: Rolling walker (2 wheeled) Gait Pattern/deviations: Decreased step length - right;Decreased step length - left     General Gait Details: Encouragement and cues; min assist to guard, and gently progress and direct RW to encourage her to take steps    Stairs             Wheelchair Mobility    Modified Rankin (Stroke Patients Only)       Balance                                            Cognition Arousal/Alertness: Awake/alert Behavior During Therapy: Flat affect Overall Cognitive Status: History of cognitive impairments - at baseline                                 General Comments: History of dementia      Exercises      General  Comments General comments (skin integrity, edema, etc.): Attempted therex, but pt refusing      Pertinent Vitals/Pain Pain Assessment: Faces Faces Pain Scale: Hurts little more Pain Location: Unspecified, but makes sense that her hip may be hurting; did not answer when directly asked "does your hip hurt?" Pain Descriptors / Indicators: Grimacing Pain Intervention(s): Monitored during session    Home Living                      Prior Function            PT Goals (current goals can now be found in the care plan section) Acute Rehab PT Goals Patient Stated Goal: reports needs to go to the bathroom PT Goal Formulation: Patient unable to participate in goal setting Time For Goal  Achievement: 12/17/19 Potential to Achieve Goals: Fair Progress towards PT goals: Progressing toward goals    Frequency    Min 2X/week      PT Plan Current plan remains appropriate    Co-evaluation              AM-PAC PT "6 Clicks" Mobility   Outcome Measure  Help needed turning from your back to your side while in a flat bed without using bedrails?: None Help needed moving from lying on your back to sitting on the side of a flat bed without using bedrails?: A Little Help needed moving to and from a bed to a chair (including a wheelchair)?: A Lot Help needed standing up from a chair using your arms (e.g., wheelchair or bedside chair)?: A Lot Help needed to walk in hospital room?: A Little Help needed climbing 3-5 steps with a railing? : Total 6 Click Score: 15    End of Session Equipment Utilized During Treatment: Gait belt Activity Tolerance: Patient tolerated treatment well Patient left: in chair;with call bell/phone within reach;with chair alarm set Nurse Communication: Mobility status;Precautions PT Visit Diagnosis: Unsteadiness on feet (R26.81);Other abnormalities of gait and mobility (R26.89);History of falling (Z91.81);Muscle weakness (generalized) (M62.81);Other symptoms and signs involving the nervous system (R29.898) Pain - Right/Left: Left Pain - part of body: Hip     Time: 1030-1046 PT Time Calculation (min) (ACUTE ONLY): 16 min  Charges:  $Gait Training: 8-22 mins                     Roney Marion, PT  [place] Pager [phone] Office [place] 12/06/2019, 11:53 AM

## 2019-12-06 NOTE — TOC Transition Note (Signed)
Transition of Care [facility]) - CM/SW Discharge Note   Patient Details  Name: Ruth Davidson MRN: [id number] Date of Birth: 1941/08/27  Transition of Care [facility]) CM/SW Contact:  Bess Kinds, RN Phone Number: [phone] 12/06/2019, 1:57 PM   Clinical Narrative:    Received call from [place] at [facility]. Patient may admit today. PTAR contacted. Patient will go to room 601-B. Nurse to call report to [phone]. Ask for nurse on 600 hall. No further TOC needs identified at this time.    Final next level of care: Skilled Nursing Facility Barriers to Discharge: No Barriers Identified   Patient Goals and CMS Choice Patient states their goals for this hospitalization and ongoing recovery are:: return to [facility].gov Compare Post Acute Care list provided to:: Patient Represenative (must comment) Choice offered to / list presented to : [facility] POA / Guardian  Discharge Placement   Existing PASRR number confirmed : 12/04/19          Patient chooses bed at: [facility] Patient to be transferred to facility by: PTAR Name of family member notified: Dondra Prader Patient and family notified of of transfer: 12/06/19  Discharge Plan and Services In-house Referral: NA Discharge Planning Services: CM Consult Post Acute Care Choice: Skilled Nursing Facility          DME Arranged: N/A DME Agency: NA       HH Arranged: NA HH Agency: NA        Social Determinants of Health (SDOH) Interventions     Readmission Risk Interventions Readmission Risk Prevention Plan 05/22/2019  Transportation Screening Complete  Home Care Screening Complete  Medication Review (RN CM) Complete  Some recent data might be hidden

## 2019-12-13 DIAGNOSIS — S72002D Fracture of unspecified part of neck of left femur, subsequent encounter for closed fracture with routine healing: Secondary | ICD-10-CM | POA: Diagnosis not present

## 2019-12-14 DIAGNOSIS — F0390 Unspecified dementia without behavioral disturbance: Secondary | ICD-10-CM | POA: Diagnosis not present

## 2019-12-14 DIAGNOSIS — S72002A Fracture of unspecified part of neck of left femur, initial encounter for closed fracture: Secondary | ICD-10-CM | POA: Diagnosis not present

## 2019-12-14 DIAGNOSIS — I2510 Atherosclerotic heart disease of native coronary artery without angina pectoris: Secondary | ICD-10-CM | POA: Diagnosis not present

## 2019-12-14 DIAGNOSIS — J449 Chronic obstructive pulmonary disease, unspecified: Secondary | ICD-10-CM | POA: Diagnosis not present

## 2020-01-10 DIAGNOSIS — L89210 Pressure ulcer of right hip, unstageable: Secondary | ICD-10-CM | POA: Diagnosis not present

## 2020-01-12 DIAGNOSIS — Z96641 Presence of right artificial hip joint: Secondary | ICD-10-CM | POA: Diagnosis not present

## 2020-01-12 DIAGNOSIS — W19XXXA Unspecified fall, initial encounter: Secondary | ICD-10-CM | POA: Diagnosis not present

## 2020-01-12 DIAGNOSIS — M25551 Pain in right hip: Secondary | ICD-10-CM | POA: Diagnosis not present

## 2020-01-22 DIAGNOSIS — R41841 Cognitive communication deficit: Secondary | ICD-10-CM | POA: Diagnosis not present

## 2020-01-22 DIAGNOSIS — F0391 Unspecified dementia with behavioral disturbance: Secondary | ICD-10-CM | POA: Diagnosis not present

## 2020-01-22 DIAGNOSIS — S72042D Displaced fracture of base of neck of left femur, subsequent encounter for closed fracture with routine healing: Secondary | ICD-10-CM | POA: Diagnosis not present

## 2020-01-25 DIAGNOSIS — I2510 Atherosclerotic heart disease of native coronary artery without angina pectoris: Secondary | ICD-10-CM | POA: Diagnosis not present

## 2020-01-25 DIAGNOSIS — L853 Xerosis cutis: Secondary | ICD-10-CM | POA: Diagnosis not present

## 2020-01-25 DIAGNOSIS — I739 Peripheral vascular disease, unspecified: Secondary | ICD-10-CM | POA: Diagnosis not present

## 2020-01-25 DIAGNOSIS — J449 Chronic obstructive pulmonary disease, unspecified: Secondary | ICD-10-CM | POA: Diagnosis not present

## 2020-01-25 DIAGNOSIS — U071 COVID-19: Secondary | ICD-10-CM | POA: Diagnosis not present

## 2020-01-25 DIAGNOSIS — I5020 Unspecified systolic (congestive) heart failure: Secondary | ICD-10-CM | POA: Diagnosis not present

## 2020-01-25 DIAGNOSIS — L602 Onychogryphosis: Secondary | ICD-10-CM | POA: Diagnosis not present

## 2020-01-31 DIAGNOSIS — F5101 Primary insomnia: Secondary | ICD-10-CM | POA: Diagnosis not present

## 2020-01-31 DIAGNOSIS — F0391 Unspecified dementia with behavioral disturbance: Secondary | ICD-10-CM | POA: Diagnosis not present

## 2020-01-31 DIAGNOSIS — F339 Major depressive disorder, recurrent, unspecified: Secondary | ICD-10-CM | POA: Diagnosis not present

## 2020-02-01 DIAGNOSIS — L89210 Pressure ulcer of right hip, unstageable: Secondary | ICD-10-CM | POA: Diagnosis not present

## 2020-02-05 DIAGNOSIS — J439 Emphysema, unspecified: Secondary | ICD-10-CM | POA: Diagnosis not present

## 2020-02-05 DIAGNOSIS — M25551 Pain in right hip: Secondary | ICD-10-CM | POA: Diagnosis not present

## 2020-02-05 DIAGNOSIS — I5040 Unspecified combined systolic (congestive) and diastolic (congestive) heart failure: Secondary | ICD-10-CM | POA: Diagnosis not present

## 2020-02-05 DIAGNOSIS — L89213 Pressure ulcer of right hip, stage 3: Secondary | ICD-10-CM | POA: Diagnosis not present

## 2020-02-21 DIAGNOSIS — I5020 Unspecified systolic (congestive) heart failure: Secondary | ICD-10-CM | POA: Diagnosis not present

## 2020-02-21 DIAGNOSIS — J449 Chronic obstructive pulmonary disease, unspecified: Secondary | ICD-10-CM | POA: Diagnosis not present

## 2020-02-21 DIAGNOSIS — I2510 Atherosclerotic heart disease of native coronary artery without angina pectoris: Secondary | ICD-10-CM | POA: Diagnosis not present

## 2020-02-21 DIAGNOSIS — Z87898 Personal history of other specified conditions: Secondary | ICD-10-CM | POA: Diagnosis not present

## 2020-02-28 DIAGNOSIS — F339 Major depressive disorder, recurrent, unspecified: Secondary | ICD-10-CM | POA: Diagnosis not present

## 2020-02-28 DIAGNOSIS — F5101 Primary insomnia: Secondary | ICD-10-CM | POA: Diagnosis not present

## 2020-02-28 DIAGNOSIS — F0391 Unspecified dementia with behavioral disturbance: Secondary | ICD-10-CM | POA: Diagnosis not present

## 2020-03-05 DIAGNOSIS — R278 Other lack of coordination: Secondary | ICD-10-CM | POA: Diagnosis not present

## 2020-03-05 DIAGNOSIS — Z96642 Presence of left artificial hip joint: Secondary | ICD-10-CM | POA: Diagnosis not present

## 2020-03-05 DIAGNOSIS — R41841 Cognitive communication deficit: Secondary | ICD-10-CM | POA: Diagnosis not present

## 2020-03-05 DIAGNOSIS — F0391 Unspecified dementia with behavioral disturbance: Secondary | ICD-10-CM | POA: Diagnosis not present

## 2020-03-05 DIAGNOSIS — M6281 Muscle weakness (generalized): Secondary | ICD-10-CM | POA: Diagnosis not present

## 2020-03-06 DIAGNOSIS — R41841 Cognitive communication deficit: Secondary | ICD-10-CM | POA: Diagnosis not present

## 2020-03-06 DIAGNOSIS — M6281 Muscle weakness (generalized): Secondary | ICD-10-CM | POA: Diagnosis not present

## 2020-03-06 DIAGNOSIS — Z96642 Presence of left artificial hip joint: Secondary | ICD-10-CM | POA: Diagnosis not present

## 2020-03-06 DIAGNOSIS — F0391 Unspecified dementia with behavioral disturbance: Secondary | ICD-10-CM | POA: Diagnosis not present

## 2020-03-06 DIAGNOSIS — R278 Other lack of coordination: Secondary | ICD-10-CM | POA: Diagnosis not present

## 2020-03-12 DIAGNOSIS — R41841 Cognitive communication deficit: Secondary | ICD-10-CM | POA: Diagnosis not present

## 2020-03-12 DIAGNOSIS — Z96642 Presence of left artificial hip joint: Secondary | ICD-10-CM | POA: Diagnosis not present

## 2020-03-12 DIAGNOSIS — F0391 Unspecified dementia with behavioral disturbance: Secondary | ICD-10-CM | POA: Diagnosis not present

## 2020-03-12 DIAGNOSIS — R278 Other lack of coordination: Secondary | ICD-10-CM | POA: Diagnosis not present

## 2020-03-12 DIAGNOSIS — M6281 Muscle weakness (generalized): Secondary | ICD-10-CM | POA: Diagnosis not present

## 2020-03-13 DIAGNOSIS — Z96642 Presence of left artificial hip joint: Secondary | ICD-10-CM | POA: Diagnosis not present

## 2020-03-13 DIAGNOSIS — F0391 Unspecified dementia with behavioral disturbance: Secondary | ICD-10-CM | POA: Diagnosis not present

## 2020-03-13 DIAGNOSIS — R278 Other lack of coordination: Secondary | ICD-10-CM | POA: Diagnosis not present

## 2020-03-13 DIAGNOSIS — R41841 Cognitive communication deficit: Secondary | ICD-10-CM | POA: Diagnosis not present

## 2020-03-13 DIAGNOSIS — M6281 Muscle weakness (generalized): Secondary | ICD-10-CM | POA: Diagnosis not present

## 2020-03-14 DIAGNOSIS — Z96642 Presence of left artificial hip joint: Secondary | ICD-10-CM | POA: Diagnosis not present

## 2020-03-14 DIAGNOSIS — M6281 Muscle weakness (generalized): Secondary | ICD-10-CM | POA: Diagnosis not present

## 2020-03-14 DIAGNOSIS — R278 Other lack of coordination: Secondary | ICD-10-CM | POA: Diagnosis not present

## 2020-03-14 DIAGNOSIS — F0391 Unspecified dementia with behavioral disturbance: Secondary | ICD-10-CM | POA: Diagnosis not present

## 2020-03-14 DIAGNOSIS — R41841 Cognitive communication deficit: Secondary | ICD-10-CM | POA: Diagnosis not present

## 2020-03-15 DIAGNOSIS — R41841 Cognitive communication deficit: Secondary | ICD-10-CM | POA: Diagnosis not present

## 2020-03-15 DIAGNOSIS — M6281 Muscle weakness (generalized): Secondary | ICD-10-CM | POA: Diagnosis not present

## 2020-03-15 DIAGNOSIS — F0391 Unspecified dementia with behavioral disturbance: Secondary | ICD-10-CM | POA: Diagnosis not present

## 2020-03-15 DIAGNOSIS — Z96642 Presence of left artificial hip joint: Secondary | ICD-10-CM | POA: Diagnosis not present

## 2020-03-15 DIAGNOSIS — R278 Other lack of coordination: Secondary | ICD-10-CM | POA: Diagnosis not present

## 2020-03-16 DIAGNOSIS — Z96642 Presence of left artificial hip joint: Secondary | ICD-10-CM | POA: Diagnosis not present

## 2020-03-16 DIAGNOSIS — R278 Other lack of coordination: Secondary | ICD-10-CM | POA: Diagnosis not present

## 2020-03-16 DIAGNOSIS — M6281 Muscle weakness (generalized): Secondary | ICD-10-CM | POA: Diagnosis not present

## 2020-03-16 DIAGNOSIS — R41841 Cognitive communication deficit: Secondary | ICD-10-CM | POA: Diagnosis not present

## 2020-03-16 DIAGNOSIS — F0391 Unspecified dementia with behavioral disturbance: Secondary | ICD-10-CM | POA: Diagnosis not present

## 2020-03-18 DIAGNOSIS — M6281 Muscle weakness (generalized): Secondary | ICD-10-CM | POA: Diagnosis not present

## 2020-03-18 DIAGNOSIS — R278 Other lack of coordination: Secondary | ICD-10-CM | POA: Diagnosis not present

## 2020-03-18 DIAGNOSIS — R41841 Cognitive communication deficit: Secondary | ICD-10-CM | POA: Diagnosis not present

## 2020-03-18 DIAGNOSIS — F0391 Unspecified dementia with behavioral disturbance: Secondary | ICD-10-CM | POA: Diagnosis not present

## 2020-03-18 DIAGNOSIS — Z96642 Presence of left artificial hip joint: Secondary | ICD-10-CM | POA: Diagnosis not present

## 2020-03-19 DIAGNOSIS — F0391 Unspecified dementia with behavioral disturbance: Secondary | ICD-10-CM | POA: Diagnosis not present

## 2020-03-19 DIAGNOSIS — E785 Hyperlipidemia, unspecified: Secondary | ICD-10-CM | POA: Diagnosis not present

## 2020-03-19 DIAGNOSIS — I2510 Atherosclerotic heart disease of native coronary artery without angina pectoris: Secondary | ICD-10-CM | POA: Diagnosis not present

## 2020-03-19 DIAGNOSIS — Z96642 Presence of left artificial hip joint: Secondary | ICD-10-CM | POA: Diagnosis not present

## 2020-03-19 DIAGNOSIS — J439 Emphysema, unspecified: Secondary | ICD-10-CM | POA: Diagnosis not present

## 2020-03-19 DIAGNOSIS — R278 Other lack of coordination: Secondary | ICD-10-CM | POA: Diagnosis not present

## 2020-03-19 DIAGNOSIS — M6281 Muscle weakness (generalized): Secondary | ICD-10-CM | POA: Diagnosis not present

## 2020-03-19 DIAGNOSIS — R41841 Cognitive communication deficit: Secondary | ICD-10-CM | POA: Diagnosis not present

## 2020-03-19 DIAGNOSIS — E559 Vitamin D deficiency, unspecified: Secondary | ICD-10-CM | POA: Diagnosis not present

## 2020-03-20 DIAGNOSIS — Z96642 Presence of left artificial hip joint: Secondary | ICD-10-CM | POA: Diagnosis not present

## 2020-03-20 DIAGNOSIS — R278 Other lack of coordination: Secondary | ICD-10-CM | POA: Diagnosis not present

## 2020-03-20 DIAGNOSIS — F0391 Unspecified dementia with behavioral disturbance: Secondary | ICD-10-CM | POA: Diagnosis not present

## 2020-03-20 DIAGNOSIS — F5101 Primary insomnia: Secondary | ICD-10-CM | POA: Diagnosis not present

## 2020-03-20 DIAGNOSIS — R41841 Cognitive communication deficit: Secondary | ICD-10-CM | POA: Diagnosis not present

## 2020-03-20 DIAGNOSIS — M6281 Muscle weakness (generalized): Secondary | ICD-10-CM | POA: Diagnosis not present

## 2020-03-20 DIAGNOSIS — F339 Major depressive disorder, recurrent, unspecified: Secondary | ICD-10-CM | POA: Diagnosis not present

## 2020-03-21 DIAGNOSIS — M6281 Muscle weakness (generalized): Secondary | ICD-10-CM | POA: Diagnosis not present

## 2020-03-21 DIAGNOSIS — F0391 Unspecified dementia with behavioral disturbance: Secondary | ICD-10-CM | POA: Diagnosis not present

## 2020-03-21 DIAGNOSIS — R278 Other lack of coordination: Secondary | ICD-10-CM | POA: Diagnosis not present

## 2020-03-21 DIAGNOSIS — Z96642 Presence of left artificial hip joint: Secondary | ICD-10-CM | POA: Diagnosis not present

## 2020-03-21 DIAGNOSIS — R41841 Cognitive communication deficit: Secondary | ICD-10-CM | POA: Diagnosis not present

## 2020-03-27 DIAGNOSIS — M6281 Muscle weakness (generalized): Secondary | ICD-10-CM | POA: Diagnosis not present

## 2020-03-27 DIAGNOSIS — Z96642 Presence of left artificial hip joint: Secondary | ICD-10-CM | POA: Diagnosis not present

## 2020-03-27 DIAGNOSIS — F0391 Unspecified dementia with behavioral disturbance: Secondary | ICD-10-CM | POA: Diagnosis not present

## 2020-03-27 DIAGNOSIS — R278 Other lack of coordination: Secondary | ICD-10-CM | POA: Diagnosis not present

## 2020-03-27 DIAGNOSIS — R41841 Cognitive communication deficit: Secondary | ICD-10-CM | POA: Diagnosis not present

## 2020-03-28 DIAGNOSIS — Z96642 Presence of left artificial hip joint: Secondary | ICD-10-CM | POA: Diagnosis not present

## 2020-03-28 DIAGNOSIS — R278 Other lack of coordination: Secondary | ICD-10-CM | POA: Diagnosis not present

## 2020-03-28 DIAGNOSIS — F0391 Unspecified dementia with behavioral disturbance: Secondary | ICD-10-CM | POA: Diagnosis not present

## 2020-03-28 DIAGNOSIS — M6281 Muscle weakness (generalized): Secondary | ICD-10-CM | POA: Diagnosis not present

## 2020-03-28 DIAGNOSIS — R41841 Cognitive communication deficit: Secondary | ICD-10-CM | POA: Diagnosis not present

## 2020-03-28 DIAGNOSIS — F339 Major depressive disorder, recurrent, unspecified: Secondary | ICD-10-CM | POA: Diagnosis not present

## 2020-03-28 DIAGNOSIS — F5101 Primary insomnia: Secondary | ICD-10-CM | POA: Diagnosis not present

## 2020-03-29 DIAGNOSIS — Z96642 Presence of left artificial hip joint: Secondary | ICD-10-CM | POA: Diagnosis not present

## 2020-03-29 DIAGNOSIS — R41841 Cognitive communication deficit: Secondary | ICD-10-CM | POA: Diagnosis not present

## 2020-03-29 DIAGNOSIS — R278 Other lack of coordination: Secondary | ICD-10-CM | POA: Diagnosis not present

## 2020-03-29 DIAGNOSIS — F0391 Unspecified dementia with behavioral disturbance: Secondary | ICD-10-CM | POA: Diagnosis not present

## 2020-03-29 DIAGNOSIS — M6281 Muscle weakness (generalized): Secondary | ICD-10-CM | POA: Diagnosis not present

## 2020-04-03 DIAGNOSIS — F5101 Primary insomnia: Secondary | ICD-10-CM | POA: Diagnosis not present

## 2020-04-03 DIAGNOSIS — F0391 Unspecified dementia with behavioral disturbance: Secondary | ICD-10-CM | POA: Diagnosis not present

## 2020-04-03 DIAGNOSIS — F419 Anxiety disorder, unspecified: Secondary | ICD-10-CM | POA: Diagnosis not present

## 2020-04-03 DIAGNOSIS — F339 Major depressive disorder, recurrent, unspecified: Secondary | ICD-10-CM | POA: Diagnosis not present

## 2020-04-05 DIAGNOSIS — J449 Chronic obstructive pulmonary disease, unspecified: Secondary | ICD-10-CM | POA: Diagnosis not present

## 2020-04-05 DIAGNOSIS — I5020 Unspecified systolic (congestive) heart failure: Secondary | ICD-10-CM | POA: Diagnosis not present

## 2020-04-05 DIAGNOSIS — Z87898 Personal history of other specified conditions: Secondary | ICD-10-CM | POA: Diagnosis not present

## 2020-04-05 DIAGNOSIS — F0391 Unspecified dementia with behavioral disturbance: Secondary | ICD-10-CM | POA: Diagnosis not present

## 2020-04-17 DIAGNOSIS — F0391 Unspecified dementia with behavioral disturbance: Secondary | ICD-10-CM | POA: Diagnosis not present

## 2020-04-17 DIAGNOSIS — F5101 Primary insomnia: Secondary | ICD-10-CM | POA: Diagnosis not present

## 2020-04-17 DIAGNOSIS — F419 Anxiety disorder, unspecified: Secondary | ICD-10-CM | POA: Diagnosis not present

## 2020-04-17 DIAGNOSIS — F339 Major depressive disorder, recurrent, unspecified: Secondary | ICD-10-CM | POA: Diagnosis not present

## 2020-04-24 DIAGNOSIS — J439 Emphysema, unspecified: Secondary | ICD-10-CM | POA: Diagnosis not present

## 2020-04-24 DIAGNOSIS — F259 Schizoaffective disorder, unspecified: Secondary | ICD-10-CM | POA: Diagnosis not present

## 2020-04-24 DIAGNOSIS — M25551 Pain in right hip: Secondary | ICD-10-CM | POA: Diagnosis not present

## 2020-04-24 DIAGNOSIS — M25552 Pain in left hip: Secondary | ICD-10-CM | POA: Diagnosis not present

## 2020-05-01 DIAGNOSIS — F419 Anxiety disorder, unspecified: Secondary | ICD-10-CM | POA: Diagnosis not present

## 2020-05-01 DIAGNOSIS — F339 Major depressive disorder, recurrent, unspecified: Secondary | ICD-10-CM | POA: Diagnosis not present

## 2020-05-01 DIAGNOSIS — F5101 Primary insomnia: Secondary | ICD-10-CM | POA: Diagnosis not present

## 2020-05-01 DIAGNOSIS — F0391 Unspecified dementia with behavioral disturbance: Secondary | ICD-10-CM | POA: Diagnosis not present

## 2020-05-13 DIAGNOSIS — J449 Chronic obstructive pulmonary disease, unspecified: Secondary | ICD-10-CM | POA: Diagnosis not present

## 2020-05-13 DIAGNOSIS — F0391 Unspecified dementia with behavioral disturbance: Secondary | ICD-10-CM | POA: Diagnosis not present

## 2020-05-13 DIAGNOSIS — F0390 Unspecified dementia without behavioral disturbance: Secondary | ICD-10-CM | POA: Diagnosis not present

## 2020-05-13 DIAGNOSIS — I5020 Unspecified systolic (congestive) heart failure: Secondary | ICD-10-CM | POA: Diagnosis not present

## 2020-05-22 DIAGNOSIS — F5101 Primary insomnia: Secondary | ICD-10-CM | POA: Diagnosis not present

## 2020-05-22 DIAGNOSIS — F419 Anxiety disorder, unspecified: Secondary | ICD-10-CM | POA: Diagnosis not present

## 2020-05-22 DIAGNOSIS — F0391 Unspecified dementia with behavioral disturbance: Secondary | ICD-10-CM | POA: Diagnosis not present

## 2020-05-22 DIAGNOSIS — F339 Major depressive disorder, recurrent, unspecified: Secondary | ICD-10-CM | POA: Diagnosis not present

## 2020-05-29 DIAGNOSIS — E785 Hyperlipidemia, unspecified: Secondary | ICD-10-CM | POA: Diagnosis not present

## 2020-05-29 DIAGNOSIS — Z79899 Other long term (current) drug therapy: Secondary | ICD-10-CM | POA: Diagnosis not present

## 2020-05-29 DIAGNOSIS — I2510 Atherosclerotic heart disease of native coronary artery without angina pectoris: Secondary | ICD-10-CM | POA: Diagnosis not present

## 2020-05-29 DIAGNOSIS — E876 Hypokalemia: Secondary | ICD-10-CM | POA: Diagnosis not present

## 2020-05-29 DIAGNOSIS — D649 Anemia, unspecified: Secondary | ICD-10-CM | POA: Diagnosis not present

## 2020-05-29 DIAGNOSIS — E782 Mixed hyperlipidemia: Secondary | ICD-10-CM | POA: Diagnosis not present

## 2020-05-29 DIAGNOSIS — I5040 Unspecified combined systolic (congestive) and diastolic (congestive) heart failure: Secondary | ICD-10-CM | POA: Diagnosis not present

## 2020-06-03 DIAGNOSIS — M6281 Muscle weakness (generalized): Secondary | ICD-10-CM | POA: Diagnosis not present

## 2020-06-03 DIAGNOSIS — S72042D Displaced fracture of base of neck of left femur, subsequent encounter for closed fracture with routine healing: Secondary | ICD-10-CM | POA: Diagnosis not present

## 2020-06-04 DIAGNOSIS — M6281 Muscle weakness (generalized): Secondary | ICD-10-CM | POA: Diagnosis not present

## 2020-06-04 DIAGNOSIS — S72042D Displaced fracture of base of neck of left femur, subsequent encounter for closed fracture with routine healing: Secondary | ICD-10-CM | POA: Diagnosis not present

## 2020-06-05 DIAGNOSIS — S72042D Displaced fracture of base of neck of left femur, subsequent encounter for closed fracture with routine healing: Secondary | ICD-10-CM | POA: Diagnosis not present

## 2020-06-05 DIAGNOSIS — M6281 Muscle weakness (generalized): Secondary | ICD-10-CM | POA: Diagnosis not present

## 2020-06-06 DIAGNOSIS — M6281 Muscle weakness (generalized): Secondary | ICD-10-CM | POA: Diagnosis not present

## 2020-06-06 DIAGNOSIS — S72042D Displaced fracture of base of neck of left femur, subsequent encounter for closed fracture with routine healing: Secondary | ICD-10-CM | POA: Diagnosis not present

## 2020-06-07 DIAGNOSIS — M6281 Muscle weakness (generalized): Secondary | ICD-10-CM | POA: Diagnosis not present

## 2020-06-07 DIAGNOSIS — S72042D Displaced fracture of base of neck of left femur, subsequent encounter for closed fracture with routine healing: Secondary | ICD-10-CM | POA: Diagnosis not present

## 2020-06-10 DIAGNOSIS — M6281 Muscle weakness (generalized): Secondary | ICD-10-CM | POA: Diagnosis not present

## 2020-06-10 DIAGNOSIS — S72042D Displaced fracture of base of neck of left femur, subsequent encounter for closed fracture with routine healing: Secondary | ICD-10-CM | POA: Diagnosis not present

## 2020-06-11 DIAGNOSIS — S72042D Displaced fracture of base of neck of left femur, subsequent encounter for closed fracture with routine healing: Secondary | ICD-10-CM | POA: Diagnosis not present

## 2020-06-11 DIAGNOSIS — M6281 Muscle weakness (generalized): Secondary | ICD-10-CM | POA: Diagnosis not present

## 2020-06-12 DIAGNOSIS — S72042D Displaced fracture of base of neck of left femur, subsequent encounter for closed fracture with routine healing: Secondary | ICD-10-CM | POA: Diagnosis not present

## 2020-06-12 DIAGNOSIS — M6281 Muscle weakness (generalized): Secondary | ICD-10-CM | POA: Diagnosis not present

## 2020-06-13 DIAGNOSIS — S72042D Displaced fracture of base of neck of left femur, subsequent encounter for closed fracture with routine healing: Secondary | ICD-10-CM | POA: Diagnosis not present

## 2020-06-13 DIAGNOSIS — M6281 Muscle weakness (generalized): Secondary | ICD-10-CM | POA: Diagnosis not present

## 2020-06-19 DIAGNOSIS — F0391 Unspecified dementia with behavioral disturbance: Secondary | ICD-10-CM | POA: Diagnosis not present

## 2020-06-19 DIAGNOSIS — F339 Major depressive disorder, recurrent, unspecified: Secondary | ICD-10-CM | POA: Diagnosis not present

## 2020-06-19 DIAGNOSIS — F5101 Primary insomnia: Secondary | ICD-10-CM | POA: Diagnosis not present

## 2020-06-19 DIAGNOSIS — F419 Anxiety disorder, unspecified: Secondary | ICD-10-CM | POA: Diagnosis not present

## 2020-06-21 DIAGNOSIS — I5020 Unspecified systolic (congestive) heart failure: Secondary | ICD-10-CM | POA: Diagnosis not present

## 2020-06-21 DIAGNOSIS — F0391 Unspecified dementia with behavioral disturbance: Secondary | ICD-10-CM | POA: Diagnosis not present

## 2020-06-21 DIAGNOSIS — J449 Chronic obstructive pulmonary disease, unspecified: Secondary | ICD-10-CM | POA: Diagnosis not present

## 2020-06-21 DIAGNOSIS — F0390 Unspecified dementia without behavioral disturbance: Secondary | ICD-10-CM | POA: Diagnosis not present

## 2020-07-24 DIAGNOSIS — F419 Anxiety disorder, unspecified: Secondary | ICD-10-CM | POA: Diagnosis not present

## 2020-07-24 DIAGNOSIS — F5101 Primary insomnia: Secondary | ICD-10-CM | POA: Diagnosis not present

## 2020-07-24 DIAGNOSIS — F0391 Unspecified dementia with behavioral disturbance: Secondary | ICD-10-CM | POA: Diagnosis not present

## 2020-07-24 DIAGNOSIS — F339 Major depressive disorder, recurrent, unspecified: Secondary | ICD-10-CM | POA: Diagnosis not present

## 2020-08-02 DIAGNOSIS — L89222 Pressure ulcer of left hip, stage 2: Secondary | ICD-10-CM | POA: Diagnosis not present

## 2020-08-02 DIAGNOSIS — L89210 Pressure ulcer of right hip, unstageable: Secondary | ICD-10-CM | POA: Diagnosis not present

## 2020-08-06 DIAGNOSIS — R451 Restlessness and agitation: Secondary | ICD-10-CM | POA: Diagnosis not present

## 2020-08-06 DIAGNOSIS — F419 Anxiety disorder, unspecified: Secondary | ICD-10-CM | POA: Diagnosis not present

## 2020-08-06 DIAGNOSIS — Z79899 Other long term (current) drug therapy: Secondary | ICD-10-CM | POA: Diagnosis not present

## 2020-08-20 DIAGNOSIS — M25552 Pain in left hip: Secondary | ICD-10-CM | POA: Diagnosis not present

## 2020-08-20 DIAGNOSIS — Z79899 Other long term (current) drug therapy: Secondary | ICD-10-CM | POA: Diagnosis not present

## 2020-08-20 DIAGNOSIS — M25551 Pain in right hip: Secondary | ICD-10-CM | POA: Diagnosis not present

## 2020-08-20 DIAGNOSIS — G8929 Other chronic pain: Secondary | ICD-10-CM | POA: Diagnosis not present

## 2020-08-21 ENCOUNTER — Encounter (HOSPITAL_BASED_OUTPATIENT_CLINIC_OR_DEPARTMENT_OTHER): Payer: Medicare Other | Admitting: Physician Assistant

## 2020-08-21 DIAGNOSIS — Z23 Encounter for immunization: Secondary | ICD-10-CM | POA: Diagnosis not present

## 2020-08-28 DIAGNOSIS — F0150 Vascular dementia without behavioral disturbance: Secondary | ICD-10-CM | POA: Diagnosis not present

## 2020-08-28 DIAGNOSIS — I10 Essential (primary) hypertension: Secondary | ICD-10-CM | POA: Diagnosis not present

## 2020-08-28 DIAGNOSIS — F419 Anxiety disorder, unspecified: Secondary | ICD-10-CM | POA: Diagnosis not present

## 2020-08-28 DIAGNOSIS — I5040 Unspecified combined systolic (congestive) and diastolic (congestive) heart failure: Secondary | ICD-10-CM | POA: Diagnosis not present

## 2020-09-18 ENCOUNTER — Encounter (HOSPITAL_BASED_OUTPATIENT_CLINIC_OR_DEPARTMENT_OTHER): Payer: Medicare Other | Admitting: Physician Assistant

## 2020-09-20 DIAGNOSIS — F251 Schizoaffective disorder, depressive type: Secondary | ICD-10-CM | POA: Diagnosis not present

## 2020-09-20 DIAGNOSIS — F5101 Primary insomnia: Secondary | ICD-10-CM | POA: Diagnosis not present

## 2020-09-20 DIAGNOSIS — S72042D Displaced fracture of base of neck of left femur, subsequent encounter for closed fracture with routine healing: Secondary | ICD-10-CM | POA: Diagnosis not present

## 2020-09-20 DIAGNOSIS — F331 Major depressive disorder, recurrent, moderate: Secondary | ICD-10-CM | POA: Diagnosis not present

## 2020-09-24 DIAGNOSIS — Z79899 Other long term (current) drug therapy: Secondary | ICD-10-CM | POA: Diagnosis not present

## 2020-09-24 DIAGNOSIS — M25551 Pain in right hip: Secondary | ICD-10-CM | POA: Diagnosis not present

## 2020-09-24 DIAGNOSIS — M25552 Pain in left hip: Secondary | ICD-10-CM | POA: Diagnosis not present

## 2020-09-24 DIAGNOSIS — G8929 Other chronic pain: Secondary | ICD-10-CM | POA: Diagnosis not present

## 2020-09-25 DIAGNOSIS — S72042D Displaced fracture of base of neck of left femur, subsequent encounter for closed fracture with routine healing: Secondary | ICD-10-CM | POA: Diagnosis not present

## 2020-09-26 DIAGNOSIS — S72042D Displaced fracture of base of neck of left femur, subsequent encounter for closed fracture with routine healing: Secondary | ICD-10-CM | POA: Diagnosis not present

## 2020-09-27 DIAGNOSIS — S72042D Displaced fracture of base of neck of left femur, subsequent encounter for closed fracture with routine healing: Secondary | ICD-10-CM | POA: Diagnosis not present

## 2020-09-30 DIAGNOSIS — S72042D Displaced fracture of base of neck of left femur, subsequent encounter for closed fracture with routine healing: Secondary | ICD-10-CM | POA: Diagnosis not present

## 2020-10-01 DIAGNOSIS — S72042D Displaced fracture of base of neck of left femur, subsequent encounter for closed fracture with routine healing: Secondary | ICD-10-CM | POA: Diagnosis not present

## 2020-10-02 DIAGNOSIS — S72042D Displaced fracture of base of neck of left femur, subsequent encounter for closed fracture with routine healing: Secondary | ICD-10-CM | POA: Diagnosis not present

## 2020-10-03 DIAGNOSIS — S72042D Displaced fracture of base of neck of left femur, subsequent encounter for closed fracture with routine healing: Secondary | ICD-10-CM | POA: Diagnosis not present

## 2020-10-04 DIAGNOSIS — S72042D Displaced fracture of base of neck of left femur, subsequent encounter for closed fracture with routine healing: Secondary | ICD-10-CM | POA: Diagnosis not present

## 2020-10-05 DIAGNOSIS — S72042D Displaced fracture of base of neck of left femur, subsequent encounter for closed fracture with routine healing: Secondary | ICD-10-CM | POA: Diagnosis not present

## 2020-10-07 DIAGNOSIS — S72042D Displaced fracture of base of neck of left femur, subsequent encounter for closed fracture with routine healing: Secondary | ICD-10-CM | POA: Diagnosis not present

## 2020-10-08 DIAGNOSIS — S72042D Displaced fracture of base of neck of left femur, subsequent encounter for closed fracture with routine healing: Secondary | ICD-10-CM | POA: Diagnosis not present

## 2020-10-09 DIAGNOSIS — S72042D Displaced fracture of base of neck of left femur, subsequent encounter for closed fracture with routine healing: Secondary | ICD-10-CM | POA: Diagnosis not present

## 2020-10-10 DIAGNOSIS — L89213 Pressure ulcer of right hip, stage 3: Secondary | ICD-10-CM | POA: Diagnosis not present

## 2020-10-10 DIAGNOSIS — S72042D Displaced fracture of base of neck of left femur, subsequent encounter for closed fracture with routine healing: Secondary | ICD-10-CM | POA: Diagnosis not present

## 2020-10-22 DIAGNOSIS — Z79899 Other long term (current) drug therapy: Secondary | ICD-10-CM | POA: Diagnosis not present

## 2020-10-22 DIAGNOSIS — G8929 Other chronic pain: Secondary | ICD-10-CM | POA: Diagnosis not present

## 2020-10-22 DIAGNOSIS — M25552 Pain in left hip: Secondary | ICD-10-CM | POA: Diagnosis not present

## 2020-10-22 DIAGNOSIS — M25551 Pain in right hip: Secondary | ICD-10-CM | POA: Diagnosis not present

## 2020-11-06 DIAGNOSIS — L89213 Pressure ulcer of right hip, stage 3: Secondary | ICD-10-CM | POA: Diagnosis not present

## 2020-11-13 DIAGNOSIS — F419 Anxiety disorder, unspecified: Secondary | ICD-10-CM | POA: Diagnosis not present

## 2020-11-13 DIAGNOSIS — J449 Chronic obstructive pulmonary disease, unspecified: Secondary | ICD-10-CM | POA: Diagnosis not present

## 2020-11-13 DIAGNOSIS — F0150 Vascular dementia without behavioral disturbance: Secondary | ICD-10-CM | POA: Diagnosis not present

## 2020-11-26 DIAGNOSIS — Z79899 Other long term (current) drug therapy: Secondary | ICD-10-CM | POA: Diagnosis not present

## 2020-11-27 DIAGNOSIS — F0150 Vascular dementia without behavioral disturbance: Secondary | ICD-10-CM | POA: Diagnosis not present

## 2020-11-27 DIAGNOSIS — G8929 Other chronic pain: Secondary | ICD-10-CM | POA: Diagnosis not present

## 2020-11-27 DIAGNOSIS — R634 Abnormal weight loss: Secondary | ICD-10-CM | POA: Diagnosis not present

## 2020-12-02 DIAGNOSIS — J449 Chronic obstructive pulmonary disease, unspecified: Secondary | ICD-10-CM | POA: Diagnosis not present

## 2020-12-02 DIAGNOSIS — I10 Essential (primary) hypertension: Secondary | ICD-10-CM | POA: Diagnosis not present

## 2020-12-02 DIAGNOSIS — G8929 Other chronic pain: Secondary | ICD-10-CM | POA: Diagnosis not present

## 2020-12-02 DIAGNOSIS — I509 Heart failure, unspecified: Secondary | ICD-10-CM | POA: Diagnosis not present

## 2020-12-03 DIAGNOSIS — G8929 Other chronic pain: Secondary | ICD-10-CM | POA: Diagnosis not present

## 2020-12-03 DIAGNOSIS — F0390 Unspecified dementia without behavioral disturbance: Secondary | ICD-10-CM | POA: Diagnosis not present

## 2020-12-05 DIAGNOSIS — G8929 Other chronic pain: Secondary | ICD-10-CM | POA: Diagnosis not present

## 2020-12-05 DIAGNOSIS — J449 Chronic obstructive pulmonary disease, unspecified: Secondary | ICD-10-CM | POA: Diagnosis not present

## 2020-12-05 DIAGNOSIS — I10 Essential (primary) hypertension: Secondary | ICD-10-CM | POA: Diagnosis not present

## 2020-12-05 DIAGNOSIS — I509 Heart failure, unspecified: Secondary | ICD-10-CM | POA: Diagnosis not present

## 2020-12-30 DIAGNOSIS — J449 Chronic obstructive pulmonary disease, unspecified: Secondary | ICD-10-CM | POA: Diagnosis not present

## 2020-12-30 DIAGNOSIS — F0390 Unspecified dementia without behavioral disturbance: Secondary | ICD-10-CM | POA: Diagnosis not present

## 2020-12-30 DIAGNOSIS — F329 Major depressive disorder, single episode, unspecified: Secondary | ICD-10-CM | POA: Diagnosis not present

## 2020-12-30 DIAGNOSIS — I509 Heart failure, unspecified: Secondary | ICD-10-CM | POA: Diagnosis not present

## 2021-01-09 DIAGNOSIS — F411 Generalized anxiety disorder: Secondary | ICD-10-CM | POA: Diagnosis not present

## 2021-01-09 DIAGNOSIS — G3101 Pick's disease: Secondary | ICD-10-CM | POA: Diagnosis not present

## 2021-01-09 DIAGNOSIS — F329 Major depressive disorder, single episode, unspecified: Secondary | ICD-10-CM | POA: Diagnosis not present

## 2021-01-09 DIAGNOSIS — F250 Schizoaffective disorder, bipolar type: Secondary | ICD-10-CM | POA: Diagnosis not present

## 2021-01-16 DIAGNOSIS — R451 Restlessness and agitation: Secondary | ICD-10-CM | POA: Diagnosis not present

## 2021-01-16 DIAGNOSIS — F0390 Unspecified dementia without behavioral disturbance: Secondary | ICD-10-CM | POA: Diagnosis not present

## 2021-01-16 DIAGNOSIS — G894 Chronic pain syndrome: Secondary | ICD-10-CM | POA: Diagnosis not present

## 2021-02-10 DIAGNOSIS — R451 Restlessness and agitation: Secondary | ICD-10-CM | POA: Diagnosis not present

## 2021-02-10 DIAGNOSIS — Z79899 Other long term (current) drug therapy: Secondary | ICD-10-CM | POA: Diagnosis not present

## 2021-02-10 DIAGNOSIS — G894 Chronic pain syndrome: Secondary | ICD-10-CM | POA: Diagnosis not present

## 2021-02-10 DIAGNOSIS — G8929 Other chronic pain: Secondary | ICD-10-CM | POA: Diagnosis not present

## 2021-02-24 DIAGNOSIS — F329 Major depressive disorder, single episode, unspecified: Secondary | ICD-10-CM | POA: Diagnosis not present

## 2021-02-24 DIAGNOSIS — I10 Essential (primary) hypertension: Secondary | ICD-10-CM | POA: Diagnosis not present

## 2021-02-24 DIAGNOSIS — I509 Heart failure, unspecified: Secondary | ICD-10-CM | POA: Diagnosis not present

## 2021-02-24 DIAGNOSIS — F0390 Unspecified dementia without behavioral disturbance: Secondary | ICD-10-CM | POA: Diagnosis not present

## 2021-03-03 DIAGNOSIS — F0390 Unspecified dementia without behavioral disturbance: Secondary | ICD-10-CM | POA: Diagnosis not present

## 2021-03-03 DIAGNOSIS — R630 Anorexia: Secondary | ICD-10-CM | POA: Diagnosis not present

## 2021-03-03 DIAGNOSIS — F329 Major depressive disorder, single episode, unspecified: Secondary | ICD-10-CM | POA: Diagnosis not present

## 2021-03-03 DIAGNOSIS — Z79899 Other long term (current) drug therapy: Secondary | ICD-10-CM | POA: Diagnosis not present

## 2021-03-06 DIAGNOSIS — G8929 Other chronic pain: Secondary | ICD-10-CM | POA: Diagnosis not present

## 2021-03-06 DIAGNOSIS — G894 Chronic pain syndrome: Secondary | ICD-10-CM | POA: Diagnosis not present

## 2021-03-06 DIAGNOSIS — Z79899 Other long term (current) drug therapy: Secondary | ICD-10-CM | POA: Diagnosis not present

## 2021-03-06 DIAGNOSIS — F339 Major depressive disorder, recurrent, unspecified: Secondary | ICD-10-CM | POA: Diagnosis not present

## 2021-04-07 DIAGNOSIS — G894 Chronic pain syndrome: Secondary | ICD-10-CM | POA: Diagnosis not present

## 2021-04-07 DIAGNOSIS — G8929 Other chronic pain: Secondary | ICD-10-CM | POA: Diagnosis not present

## 2021-04-07 DIAGNOSIS — Z79899 Other long term (current) drug therapy: Secondary | ICD-10-CM | POA: Diagnosis not present

## 2021-04-07 DIAGNOSIS — F0390 Unspecified dementia without behavioral disturbance: Secondary | ICD-10-CM | POA: Diagnosis not present

## 2021-04-22 DIAGNOSIS — J449 Chronic obstructive pulmonary disease, unspecified: Secondary | ICD-10-CM | POA: Diagnosis not present

## 2021-04-22 DIAGNOSIS — G894 Chronic pain syndrome: Secondary | ICD-10-CM | POA: Diagnosis not present

## 2021-04-22 DIAGNOSIS — E559 Vitamin D deficiency, unspecified: Secondary | ICD-10-CM | POA: Diagnosis not present

## 2021-04-28 DIAGNOSIS — Z96642 Presence of left artificial hip joint: Secondary | ICD-10-CM | POA: Diagnosis not present

## 2021-05-08 ENCOUNTER — Encounter (HOSPITAL_COMMUNITY): Payer: Self-pay

## 2021-05-08 ENCOUNTER — Emergency Department (HOSPITAL_COMMUNITY): Payer: Medicare Other

## 2021-05-08 ENCOUNTER — Emergency Department (HOSPITAL_COMMUNITY)
Admission: EM | Admit: 2021-05-08 | Discharge: 2021-05-08 | Disposition: A | Discharge status: Skilled Nursing Facility | Payer: Medicare Other | Attending: Emergency Medicine | Admitting: Emergency Medicine

## 2021-05-08 ENCOUNTER — Other Ambulatory Visit: Payer: Self-pay

## 2021-05-08 DIAGNOSIS — Z96643 Presence of artificial hip joint, bilateral: Secondary | ICD-10-CM | POA: Insufficient documentation

## 2021-05-08 DIAGNOSIS — I110 Hypertensive heart disease with heart failure: Secondary | ICD-10-CM | POA: Diagnosis not present

## 2021-05-08 DIAGNOSIS — F17210 Nicotine dependence, cigarettes, uncomplicated: Secondary | ICD-10-CM | POA: Insufficient documentation

## 2021-05-08 DIAGNOSIS — I5042 Chronic combined systolic (congestive) and diastolic (congestive) heart failure: Secondary | ICD-10-CM | POA: Insufficient documentation

## 2021-05-08 DIAGNOSIS — Z955 Presence of coronary angioplasty implant and graft: Secondary | ICD-10-CM | POA: Diagnosis not present

## 2021-05-08 DIAGNOSIS — F0390 Unspecified dementia without behavioral disturbance: Secondary | ICD-10-CM | POA: Diagnosis not present

## 2021-05-08 DIAGNOSIS — I2510 Atherosclerotic heart disease of native coronary artery without angina pectoris: Secondary | ICD-10-CM | POA: Insufficient documentation

## 2021-05-08 DIAGNOSIS — Z79899 Other long term (current) drug therapy: Secondary | ICD-10-CM | POA: Diagnosis not present

## 2021-05-08 DIAGNOSIS — I959 Hypotension, unspecified: Secondary | ICD-10-CM | POA: Diagnosis not present

## 2021-05-08 DIAGNOSIS — J449 Chronic obstructive pulmonary disease, unspecified: Secondary | ICD-10-CM | POA: Diagnosis not present

## 2021-05-08 DIAGNOSIS — R4182 Altered mental status, unspecified: Secondary | ICD-10-CM | POA: Diagnosis not present

## 2021-05-08 DIAGNOSIS — R410 Disorientation, unspecified: Secondary | ICD-10-CM

## 2021-05-08 DIAGNOSIS — I10 Essential (primary) hypertension: Secondary | ICD-10-CM | POA: Diagnosis not present

## 2021-05-08 DIAGNOSIS — R404 Transient alteration of awareness: Secondary | ICD-10-CM | POA: Diagnosis not present

## 2021-05-08 HISTORY — DX: Schizotypal disorder: F21

## 2021-05-08 HISTORY — DX: Hyperlipidemia, unspecified: E78.5

## 2021-05-08 HISTORY — DX: Major depressive disorder, single episode, unspecified: F32.9

## 2021-05-08 HISTORY — DX: Peripheral vascular disease, unspecified: I73.9

## 2021-05-08 HISTORY — DX: Anxiety disorder, unspecified: F41.9

## 2021-05-08 HISTORY — DX: Essential (primary) hypertension: I10

## 2021-05-08 HISTORY — DX: Other chronic pain: G89.29

## 2021-05-08 LAB — URINALYSIS, ROUTINE W REFLEX MICROSCOPIC
Bilirubin Urine: NEGATIVE
Glucose, UA: NEGATIVE mg/dL
Hgb urine dipstick: NEGATIVE
Ketones, ur: 5 mg/dL — AB
Leukocytes,Ua: NEGATIVE
Nitrite: NEGATIVE
Protein, ur: NEGATIVE mg/dL
Specific Gravity, Urine: 1.012 (ref 1.005–1.030)
pH: 5 (ref 5.0–8.0)

## 2021-05-08 LAB — CBC WITH DIFFERENTIAL/PLATELET
Abs Immature Granulocytes: 0.03 10*3/uL (ref 0.00–0.07)
Basophils Absolute: 0.1 10*3/uL (ref 0.0–0.1)
Basophils Relative: 1 %
Eosinophils Absolute: 0.2 10*3/uL (ref 0.0–0.5)
Eosinophils Relative: 2 %
HCT: 46 % (ref 36.0–46.0)
Hemoglobin: 14.3 g/dL (ref 12.0–15.0)
Immature Granulocytes: 1 %
Lymphocytes Relative: 25 %
Lymphs Abs: 1.6 10*3/uL (ref 0.7–4.0)
MCH: 30.6 pg (ref 26.0–34.0)
MCHC: 31.1 g/dL (ref 30.0–36.0)
MCV: 98.3 fL (ref 80.0–100.0)
Monocytes Absolute: 0.4 10*3/uL (ref 0.1–1.0)
Monocytes Relative: 6 %
Neutro Abs: 4.3 10*3/uL (ref 1.7–7.7)
Neutrophils Relative %: 65 %
Platelets: 288 10*3/uL (ref 150–400)
RBC: 4.68 MIL/uL (ref 3.87–5.11)
RDW: 13.6 % (ref 11.5–15.5)
WBC: 6.6 10*3/uL (ref 4.0–10.5)
nRBC: 0 % (ref 0.0–0.2)

## 2021-05-08 LAB — COMPREHENSIVE METABOLIC PANEL
ALT: 12 U/L (ref 0–44)
AST: 18 U/L (ref 15–41)
Albumin: 3.4 g/dL — ABNORMAL LOW (ref 3.5–5.0)
Alkaline Phosphatase: 200 U/L — ABNORMAL HIGH (ref 38–126)
Anion gap: 8 (ref 5–15)
BUN: 22 mg/dL (ref 8–23)
CO2: 28 mmol/L (ref 22–32)
Calcium: 9.1 mg/dL (ref 8.9–10.3)
Chloride: 105 mmol/L (ref 98–111)
Creatinine, Ser: 1.2 mg/dL — ABNORMAL HIGH (ref 0.44–1.00)
GFR, Estimated: 46 mL/min — ABNORMAL LOW (ref >60)
Glucose, Bld: 82 mg/dL (ref 70–99)
Potassium: 4.2 mmol/L (ref 3.5–5.1)
Sodium: 141 mmol/L (ref 135–145)
Total Bilirubin: 0.7 mg/dL (ref 0.3–1.2)
Total Protein: 6.3 g/dL — ABNORMAL LOW (ref 6.5–8.1)

## 2021-05-08 NOTE — Discharge Instructions (Addendum)
Lab work and imaging were reassuring.  Recommend continuing with all home medications as prescribed.  Patient slightly elevated alk phos recommend following up with primary care provider for further evaluation.  Come back to the emergency department if you develop chest pain, shortness of breath, severe abdominal pain, uncontrolled nausea, vomiting, diarrhea.  

## 2021-05-08 NOTE — ED Notes (Signed)
Patient confused and alert to self at baseline. Patient has pulled IV out and blood cleaned up at this time.

## 2021-05-08 NOTE — ED Notes (Signed)
With multiple attempts patient stood at the bedside with 2 person assist. Patient pulse oxygen no less than 95% while moving and standing.

## 2021-05-08 NOTE — ED Provider Notes (Signed)
[place] Provider Note   CSN: [id number] Arrival date & time: 05/08/21  4665     History Chief Complaint  Patient presents with  . Altered Mental Status    Ruth Davidson is a 80 y.o. female.  HPI  HPI will be deferred due to level 5 caveat dementia  Patient with significant medical history of CAD with PCI 2003, CHF EF of less than 20, left hip fracture, dementia DNR presents to the emergency department with chief complaint of altered mental status.  Spoke with nursing staff from [facility] who endorse that patient appears to be more altered than her baseline, states that she would not get out of bed, and is not eating very much.  They are unsure of how long this has been going on for, they deny recent falls, denies  fevers, chills, denies urinary symptoms.   at baseline patient can ambulate a little bit but is mostly bed bound, she remains confused at baseline.     Past Medical History:  Diagnosis Date  . Anxiety   . Cardiac cachexia   . CHF (congestive heart failure) ([place])   . Chronic pain   . COPD (chronic obstructive pulmonary disease) ([place])   . Coronary artery disease   . Dementia ([place])   . Fracture of femoral neck, right ([place]) 11/18/2019  . Hyperlipidemia   . Hypertension   . Major depressive disorder   . MI (myocardial infarction) ([place])   . Non-compliance   . PVD (peripheral vascular disease) ([place])   . Schizophrenia, borderline ([place])   . Thyroid disease   . Tobacco use     Patient Active Problem List   Diagnosis Date Noted  . Hip fracture ([place]) 11/30/2019  . Chronic systolic CHF (congestive heart failure) ([place]) 11/19/2019  . Fracture of femoral neck, right ([place]) 11/18/2019  . Hyperthyroidism 05/18/2019  . Hypokalemia 05/17/2019  . Protein-calorie malnutrition, severe 05/17/2019  . Moderate malnutrition ([place]) 05/17/2019  . Palliative care by specialist   . Failure to thrive in adult   . Orthostatic hypotension 06/22/2017  . Chronic  combined systolic and diastolic CHF (congestive heart failure) ([place]) 06/22/2017  . Goals of care, counseling/discussion   . Palliative care encounter   . Pre-syncope 06/21/2017  . Subdural hematoma ([place]) 06/21/2017  . Acute on chronic systolic CHF (congestive heart failure) ([place]) 08/28/2016  . Noncompliance 08/28/2016  . 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 Emphysema of lung ([place]) 06/11/2016  . 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 Dementia ([place]) 06/11/2016  . NSVT (nonsustained ventricular tachycardia) ([place]) 04/15/2016  . Acute systolic CHF (congestive heart failure) ([place]) 04/15/2016  . Community acquired pneumonia   . CAD S/P remote PCI 08/30/2013  . Hyperlipidemia 08/30/2013  . Tobacco abuse 08/30/2013    Past Surgical History:  Procedure Laterality Date  . CHOLECYSTECTOMY    . CORONARY ANGIOPLASTY WITH STENT PLACEMENT  2003  . HIP ARTHROPLASTY Right 11/19/2019   Procedure: RIGHT HIP HEMI-ARTHROPLASTY;  Surgeon: Marchia Bond, MD;  Location: [place];  Service: Orthopedics;  Laterality: Right;  . HIP ARTHROPLASTY Left 12/01/2019   Procedure: LEFT HEMI HIP ARTHROPLASTY;  Surgeon: Marchia Bond, MD;  Location: [place];  Service: Orthopedics;  Laterality: Left;  . THYROIDECTOMY     goiter/no cancer, per report     OB History   No obstetric history on file.     Family History  Problem Relation Age of Onset  . Hypertension Father        Died  in war  . Diabetes Mother   . Breast cancer Mother   . Pneumonia Sister   . Hypertension Son     Social History   Tobacco Use  . Smoking status: Every Day    Packs/day: 1.00    Years: 50.00    Pack years: 50.00    Types: Cigarettes    Start date: 11/02/1958  . Smokeless tobacco: Never  Vaping Use  . Vaping Use: Never used  Substance Use Topics  . Alcohol use: No    Alcohol/week: 0.0 standard drinks  . Drug use: No    Home Medications Prior to Admission medications   Medication Sig Start Date End Date Taking?  Authorizing Provider  Amino Acids-Protein Hydrolys (FEEDING SUPPLEMENT, PRO-STAT SUGAR FREE 64,) LIQD Take 30 mLs by mouth daily.    [provider]  ascorbic acid (VITAMIN C) 500 MG tablet Take 500 mg by mouth 2 (two) times daily.    [provider]  bisacodyl (DULCOLAX) 10 MG suppository Place 1 suppository (10 mg total) rectally daily as needed for moderate constipation. 11/21/19   Hollice Gong, Mir Earlie Server, MD  carvedilol (COREG) 3.125 MG tablet Take 1 tablet (3.125 mg total) by mouth 2 (two) times daily with a meal. 11/21/19 12/21/19  Hollice Gong, Mir Earlie Server, MD  Cholecalciferol 50 MCG (2000 UT) CAPS Take 1 capsule by mouth daily.    [provider]  docusate sodium (COLACE) 100 MG capsule Take 1 capsule (100 mg total) by mouth 2 (two) times daily. Patient not taking: Reported on 11/30/2019 11/21/19   Hollice Gong, Mir Mohammed, MD  feeding supplement, ENSURE ENLIVE, (ENSURE ENLIVE) LIQD Take 237 mLs by mouth 2 (two) times daily between meals. Patient not taking: Reported on 11/18/2019 05/29/19   Murlean Iba, MD  FLUoxetine (PROZAC) 10 MG capsule Take 10 mg by mouth daily. 04/16/21   [provider]  Melatonin 3 MG TABS Take 1 tablet by mouth at bedtime.    [provider]  Multiple Vitamin (MULTIVITAMIN WITH MINERALS) TABS tablet Take 1 tablet by mouth daily. 05/30/19   Johnson, Clanford L, MD  nitroGLYCERIN (NITROSTAT) 0.4 MG SL tablet Place 1 tablet (0.4 mg total) under the tongue every 5 (five) minutes as needed for chest pain (or back pain or arm pain). 06/08/75   Delora Fuel, MD  OLANZapine (ZYPREXA) 2.5 MG tablet Take 2.5 mg by mouth at bedtime. 04/17/21   [provider]  potassium chloride (K-DUR) 10 MEQ tablet Take 1 tablet (10 mEq total) by mouth every other day. Patient taking differently: Take 10 mEq by mouth daily.  05/31/19   Johnson, Clanford L, MD  traMADol (ULTRAM) 50 MG tablet Take 50 mg by mouth every 6 (six) hours as needed.  04/03/21   [provider]    Allergies    Patient has no known allergies.  Review of Systems   Review of Systems  Unable to perform ROS: Dementia   Physical Exam Updated Vital Signs BP (!) 109/91   Pulse 68   Temp 98.3 F (36.8 C) (Oral)   Resp 11   Ht $R'5\' 4"'gK$  (1.626 m)   Wt 45 kg   SpO2 98%   BMI 17.03 kg/m   Physical Exam Vitals and nursing note reviewed.  Constitutional:      General: She is not in acute distress.    Appearance: She is not ill-appearing.     Comments: Patient is disheveled, deconditioned state.  HENT:     Head: Normocephalic and  atraumatic.     Nose: No congestion.  Eyes:     Conjunctiva/sclera: Conjunctivae normal.     Pupils: Pupils are equal, round, and reactive to light.  Cardiovascular:     Rate and Rhythm: Normal rate and regular rhythm.     Pulses: Normal pulses.     Heart sounds: No murmur heard.   No friction rub. No gallop.  Pulmonary:     Effort: No respiratory distress.     Breath sounds: No wheezing, rhonchi or rales.  Abdominal:     Palpations: Abdomen is soft.     Tenderness: There is no abdominal tenderness.  Musculoskeletal:     Cervical back: No rigidity.     Right lower leg: No edema.     Left lower leg: No edema.  Skin:    General: Skin is warm and dry.  Neurological:     Mental Status: She is alert.     Comments: Face is symmetric, patient is able to follow commands, no unilateral weakness present.  Psychiatric:        Mood and Affect: Mood normal.    ED Results / Procedures / Treatments   Labs (all labs ordered are listed, but only abnormal results are displayed) Labs Reviewed  COMPREHENSIVE METABOLIC PANEL - Abnormal; Notable for the following components:      Result Value   Creatinine, Ser 1.20 (*)    Total Protein 6.3 (*)    Albumin 3.4 (*)    Alkaline Phosphatase 200 (*)    GFR, Estimated 46 (*)    All other components within normal limits  URINALYSIS, ROUTINE W REFLEX MICROSCOPIC - Abnormal;  Notable for the following components:   Ketones, ur 5 (*)    All other components within normal limits  CBC WITH DIFFERENTIAL/PLATELET    EKG EKG Interpretation  Date/Time:  Thursday May 08 2021 08:53:32 EDT Ventricular Rate:  63 PR Interval:  163 QRS Duration: 106 QT Interval:  442 QTC Calculation: 453 R Axis:   54 Text Interpretation: Sinus rhythm Anterior infarct, old Nonspecific T abnormalities, lateral leads Since last tracing Rate slower Otherwise no significant change Confirmed by Calvert Cantor [phone]) on 05/08/2021 9:40:43 AM  Radiology DG Chest Port 1 View  Result Date: 05/08/2021 CLINICAL DATA:  Altered mental status. EXAM: PORTABLE CHEST 1 VIEW COMPARISON:  November 17, 2019. FINDINGS: The heart size and mediastinal contours are within normal limits. Both lungs are clear. No visible pleural effusions or pneumothorax. Biapical pleuroparenchymal scarring. No acute osseous abnormality. Clips projecting at the left upper chest/thoracic inlet. IMPRESSION: No active disease. Electronically Signed   By: Margaretha Sheffield MD   On: 05/08/2021 10:02    Procedures Procedures   Medications Ordered in ED Medications - No data to display  ED Course  I have reviewed the triage vital signs and the nursing notes.  Pertinent labs & imaging results that were available during my care of the patient were reviewed by me and considered in my medical decision making (see chart for details).    MDM Rules/Calculators/A&P                         Initial impression-patient presents with concerns of altered mental status.  She is alert, does not appear in acute distress, vital signs reassuring.  Will obtain basic lab work-up, ambulate the patient and reassess.  Work-up-CBC unremarkable, CMP shows slight increased creatinine of 1.2, alk phos 200, GFR 46.  UA unremarkable, chest  x-ray unremarkable, EKG sinus without signs of ischemia.  Patient was able to stand without difficulty.  Rule out-low  suspicion for systemic infection as patient is nontoxic-appearing, vital signs reassuring, no leukocytosis seen on CBC.  Low suspicion for CVA or intracranial head bleed as there is no focal deficits present my exam, no face asymmetry, no unilateral weakness, there has been no recent falls, patient is not on an anticoag.  Patient is altered at baseline from dementia.  Low suspicion for URI or pneumonia as lung sounds are clear bilaterally, chest x-ray unremarkable.  Low suspicion for urosepsis as urine is negative for signs of infection.  Patient does have slightly elevated alk phos but she has no abdominal tenderness, no nausea or vomiting, will have her follow-up outpatient for further evaluation.  Plan-  Altered mental status resolved-suspect patient was tired and did not want to communicate with nursing staff today.  Patient does have slightly elevated alk phos nonspecific will have her follow-up with PCP for further evaluation.  Vital signs have remained stable, no indication for hospital admission.  Patient discussed with attending and they agreed with assessment and plan.  Patient given at home care as well strict return precautions.  Patient verbalized that they understood agreed to said plan.  Final Clinical Impression(s) / ED Diagnoses Final diagnoses:  Confusion    Rx / DC Orders ED Discharge Orders     None        Marcello Fennel, PA-C 05/08/21 1053    Truddie Hidden, MD 05/08/21 1246

## 2021-05-08 NOTE — ED Triage Notes (Signed)
Per EMS patient coming from [place] for AMS. Per nurse at SNF, she walked in on this patient altered. Patient's roommate also here for AMS. Patient is altered at baseline and alert to self and follows most commands. Patient denies pain. Unknown LKWT.

## 2021-10-04 ENCOUNTER — Other Ambulatory Visit: Payer: Self-pay

## 2021-10-04 ENCOUNTER — Encounter (HOSPITAL_COMMUNITY): Payer: Self-pay | Admitting: *Deleted

## 2021-10-04 ENCOUNTER — Emergency Department (HOSPITAL_COMMUNITY)
Admission: EM | Admit: 2021-10-04 | Discharge: 2021-10-04 | Disposition: A | Discharge status: Home / Self Care | Payer: Medicare (Managed Care) | Attending: Emergency Medicine | Admitting: Emergency Medicine

## 2021-10-04 DIAGNOSIS — Z96641 Presence of right artificial hip joint: Secondary | ICD-10-CM | POA: Diagnosis not present

## 2021-10-04 DIAGNOSIS — I2510 Atherosclerotic heart disease of native coronary artery without angina pectoris: Secondary | ICD-10-CM | POA: Insufficient documentation

## 2021-10-04 DIAGNOSIS — F17210 Nicotine dependence, cigarettes, uncomplicated: Secondary | ICD-10-CM | POA: Diagnosis not present

## 2021-10-04 DIAGNOSIS — I5043 Acute on chronic combined systolic (congestive) and diastolic (congestive) heart failure: Secondary | ICD-10-CM | POA: Insufficient documentation

## 2021-10-04 DIAGNOSIS — F0390 Unspecified dementia without behavioral disturbance: Secondary | ICD-10-CM | POA: Insufficient documentation

## 2021-10-04 DIAGNOSIS — Z96642 Presence of left artificial hip joint: Secondary | ICD-10-CM | POA: Diagnosis not present

## 2021-10-04 DIAGNOSIS — I110 Hypertensive heart disease with heart failure: Secondary | ICD-10-CM | POA: Insufficient documentation

## 2021-10-04 DIAGNOSIS — Z79899 Other long term (current) drug therapy: Secondary | ICD-10-CM | POA: Diagnosis not present

## 2021-10-04 DIAGNOSIS — J449 Chronic obstructive pulmonary disease, unspecified: Secondary | ICD-10-CM | POA: Diagnosis not present

## 2021-10-04 DIAGNOSIS — R630 Anorexia: Secondary | ICD-10-CM | POA: Diagnosis not present

## 2021-10-04 DIAGNOSIS — Z20822 Contact with and (suspected) exposure to covid-19: Secondary | ICD-10-CM | POA: Diagnosis not present

## 2021-10-04 LAB — CBC WITH DIFFERENTIAL/PLATELET
Abs Immature Granulocytes: 0.05 10*3/uL (ref 0.00–0.07)
Basophils Absolute: 0 10*3/uL (ref 0.0–0.1)
Basophils Relative: 1 %
Eosinophils Absolute: 0.1 10*3/uL (ref 0.0–0.5)
Eosinophils Relative: 2 %
HCT: 42.8 % (ref 36.0–46.0)
Hemoglobin: 13.5 g/dL (ref 12.0–15.0)
Immature Granulocytes: 1 %
Lymphocytes Relative: 20 %
Lymphs Abs: 1.4 10*3/uL (ref 0.7–4.0)
MCH: 31.5 pg (ref 26.0–34.0)
MCHC: 31.5 g/dL (ref 30.0–36.0)
MCV: 99.8 fL (ref 80.0–100.0)
Monocytes Absolute: 0.8 10*3/uL (ref 0.1–1.0)
Monocytes Relative: 11 %
Neutro Abs: 4.6 10*3/uL (ref 1.7–7.7)
Neutrophils Relative %: 65 %
Platelets: 274 10*3/uL (ref 150–400)
RBC: 4.29 MIL/uL (ref 3.87–5.11)
RDW: 12.9 % (ref 11.5–15.5)
WBC: 7 10*3/uL (ref 4.0–10.5)
nRBC: 0 % (ref 0.0–0.2)

## 2021-10-04 LAB — COMPREHENSIVE METABOLIC PANEL
ALT: 10 U/L (ref 0–44)
AST: 14 U/L — ABNORMAL LOW (ref 15–41)
Albumin: 2.9 g/dL — ABNORMAL LOW (ref 3.5–5.0)
Alkaline Phosphatase: 107 U/L (ref 38–126)
Anion gap: 11 (ref 5–15)
BUN: 24 mg/dL — ABNORMAL HIGH (ref 8–23)
CO2: 25 mmol/L (ref 22–32)
Calcium: 8.9 mg/dL (ref 8.9–10.3)
Chloride: 106 mmol/L (ref 98–111)
Creatinine, Ser: 1.01 mg/dL — ABNORMAL HIGH (ref 0.44–1.00)
GFR, Estimated: 57 mL/min — ABNORMAL LOW (ref >60)
Glucose, Bld: 105 mg/dL — ABNORMAL HIGH (ref 70–99)
Potassium: 4.2 mmol/L (ref 3.5–5.1)
Sodium: 142 mmol/L (ref 135–145)
Total Bilirubin: 0.1 mg/dL — ABNORMAL LOW (ref 0.3–1.2)
Total Protein: 6.1 g/dL — ABNORMAL LOW (ref 6.5–8.1)

## 2021-10-04 LAB — I-STAT CHEM 8, ED
BUN: 28 mg/dL — ABNORMAL HIGH (ref 8–23)
Calcium, Ion: 1.15 mmol/L (ref 1.15–1.40)
Chloride: 106 mmol/L (ref 98–111)
Creatinine, Ser: 1 mg/dL (ref 0.44–1.00)
Glucose, Bld: 99 mg/dL (ref 70–99)
HCT: 43 % (ref 36.0–46.0)
Hemoglobin: 14.6 g/dL (ref 12.0–15.0)
Potassium: 4.4 mmol/L (ref 3.5–5.1)
Sodium: 144 mmol/L (ref 135–145)
TCO2: 29 mmol/L (ref 22–32)

## 2021-10-04 LAB — RESP PANEL BY RT-PCR (FLU A&B, COVID) ARPGX2
Influenza A by PCR: NEGATIVE
Influenza B by PCR: NEGATIVE
SARS Coronavirus 2 by RT PCR: NEGATIVE

## 2021-10-04 MED ORDER — SODIUM CHLORIDE 0.9 % IV BOLUS
500.0000 mL | Freq: Once | INTRAVENOUS | Status: AC
  Administered 2021-10-04: 500 mL via INTRAVENOUS

## 2021-10-04 MED ORDER — SODIUM CHLORIDE 0.9 % IV BOLUS: 1000.0000 mL | Freq: Once | INTRAVENOUS | Status: DC

## 2021-10-04 NOTE — ED Provider Notes (Signed)
[facility] EMERGENCY DEPARTMENT Provider Note   CSN: [id number] Arrival date & time: 10/04/21  1355   LEVEL 5 CAVEAT: DEMENTIA   History No chief complaint on file.   Ruth Davidson is a 80 y.o. female with history of dementia and schizophrenia who presents to the emergency department with loss of appetite.  Patient is confused at baseline and currently not complaining of anything.  She is not very talkative.  I attempted to call [facility] was placed on hold and ultimately they hung up.  HPI     Past Medical History:  Diagnosis Date   Anxiety    Cardiac cachexia    CHF (congestive heart failure) (HCC)    Chronic pain    COPD (chronic obstructive pulmonary disease) (HCC)    Coronary artery disease    Dementia (HCC)    Fracture of femoral neck, right (HCC) 11/18/2019   Hyperlipidemia    Hypertension    Major depressive disorder    MI (myocardial infarction) (HCC)    Non-compliance    PVD (peripheral vascular disease) (HCC)    Schizophrenia, borderline (HCC)    Thyroid disease    Tobacco use     Patient Active Problem List   Diagnosis Date Noted   Hip fracture (HCC) 11/30/2019   Chronic systolic CHF (congestive heart failure) (HCC) 11/19/2019   Fracture of femoral neck, right (HCC) 11/18/2019   Hyperthyroidism 05/18/2019   Hypokalemia 05/17/2019   Protein-calorie malnutrition, severe 05/17/2019   Moderate malnutrition (HCC) 05/17/2019   Palliative care by specialist    Failure to thrive in adult    Orthostatic hypotension 06/22/2017   Chronic combined systolic and diastolic CHF (congestive heart failure) (HCC) 06/22/2017   Goals of care, counseling/discussion    Palliative care encounter    Pre-syncope 06/21/2017   Subdural hematoma 06/21/2017   Acute on chronic systolic CHF (congestive heart failure) (HCC) 08/28/2016   Noncompliance 08/28/2016   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   Emphysema of lung (HCC) 06/11/2016   Cardiac cachexia  06/11/2016   Dementia (HCC) 06/11/2016   NSVT (nonsustained ventricular tachycardia) 04/15/2016   Acute systolic CHF (congestive heart failure) (HCC) 04/15/2016   Community acquired pneumonia    CAD S/P remote PCI 08/30/2013   Hyperlipidemia 08/30/2013   Tobacco abuse 08/30/2013    Past Surgical History:  Procedure Laterality Date   CHOLECYSTECTOMY     CORONARY ANGIOPLASTY WITH STENT PLACEMENT  2003   HIP ARTHROPLASTY Right 11/19/2019   Procedure: RIGHT HIP HEMI-ARTHROPLASTY;  Surgeon: Teryl Lucy, MD;  Location: [facility] OR;  Service: Orthopedics;  Laterality: Right;   HIP ARTHROPLASTY Left 12/01/2019   Procedure: LEFT HEMI HIP ARTHROPLASTY;  Surgeon: Teryl Lucy, MD;  Location: [facility] OR;  Service: Orthopedics;  Laterality: Left;   THYROIDECTOMY     goiter/no cancer, per report     OB History   No obstetric history on file.     Family History  Problem Relation Age of Onset   Hypertension Father        Died in war   Diabetes Mother    Breast cancer Mother    Pneumonia Sister    Hypertension Son     Social History   Tobacco Use   Smoking status: Every Day    Packs/day: 1.00    Years: 50.00    Pack years: 50.00    Types: Cigarettes    Start date: 11/02/1958   Smokeless tobacco: Never  Vaping Use   Vaping Use: Never used  Substance Use Topics   Alcohol use: No    Alcohol/week: 0.0 standard drinks   Drug use: No    Home Medications Prior to Admission medications   Medication Sig Start Date End Date Taking? Authorizing Provider  Amino Acids-Protein Hydrolys (FEEDING SUPPLEMENT, PRO-STAT SUGAR FREE 64,) LIQD Take 30 mLs by mouth daily.    [provider]  ascorbic acid (VITAMIN C) 500 MG tablet Take 500 mg by mouth 2 (two) times daily.    [provider]  bisacodyl (DULCOLAX) 10 MG suppository Place 1 suppository (10 mg total) rectally daily as needed for moderate constipation. 11/21/19   Kirby Crigler, Mir Shirline Frees, MD  carvedilol (COREG) 3.125 MG tablet  Take 1 tablet (3.125 mg total) by mouth 2 (two) times daily with a meal. 11/21/19 12/21/19  Kirby Crigler, Mir Shirline Frees, MD  Cholecalciferol 50 MCG (2000 UT) CAPS Take 1 capsule by mouth daily.    [provider]  docusate sodium (COLACE) 100 MG capsule Take 1 capsule (100 mg total) by mouth 2 (two) times daily. Patient not taking: Reported on 11/30/2019 11/21/19   Kirby Crigler, Mir Mohammed, MD  feeding supplement, ENSURE ENLIVE, (ENSURE ENLIVE) LIQD Take 237 mLs by mouth 2 (two) times daily between meals. Patient not taking: Reported on 11/18/2019 05/29/19   Cleora Fleet, MD  FLUoxetine (PROZAC) 10 MG capsule Take 10 mg by mouth daily. 04/16/21   [provider]  Melatonin 3 MG TABS Take 1 tablet by mouth at bedtime.    [provider]  Multiple Vitamin (MULTIVITAMIN WITH MINERALS) TABS tablet Take 1 tablet by mouth daily. 05/30/19   Johnson, Clanford L, MD  nitroGLYCERIN (NITROSTAT) 0.4 MG SL tablet Place 1 tablet (0.4 mg total) under the tongue every 5 (five) minutes as needed for chest pain (or back pain or arm pain). 11/10/15   Dione Booze, MD  OLANZapine (ZYPREXA) 2.5 MG tablet Take 2.5 mg by mouth at bedtime. 04/17/21   [provider]  potassium chloride (K-DUR) 10 MEQ tablet Take 1 tablet (10 mEq total) by mouth every other day. Patient taking differently: Take 10 mEq by mouth daily.  05/31/19   Johnson, Clanford L, MD  traMADol (ULTRAM) 50 MG tablet Take 50 mg by mouth every 6 (six) hours as needed. 04/03/21   [provider]    Allergies    Patient has no known allergies.  Review of Systems   Review of Systems  Unable to perform ROS: Dementia   Physical Exam Updated Vital Signs BP 115/63   Pulse 81   Temp (!) 97.5 F (36.4 C) (Oral)   Resp 18   Ht  (1.626 m)   Wt 45 kg   SpO2 95%   BMI 17.03 kg/m   Physical Exam Vitals and nursing note reviewed.  Constitutional:      Appearance: Normal appearance.     Comments: Disheveled.   Appears chronically ill.  HENT:     Head: Normocephalic and atraumatic.  Eyes:     General:        Right eye: No discharge.        Left eye: No discharge.  Cardiovascular:     Comments: Regular rate and rhythm.  S1/S2 are distinct without any evidence of murmur, rubs, or gallops.  Radial pulses are 2+ bilaterally.  Dorsalis pedis pulses are 2+ bilaterally.  No evidence of pedal edema. Pulmonary:     Comments: Clear to auscultation bilaterally.  Normal effort.  No respiratory distress.  No evidence  of wheezes, rales, or rhonchi heard throughout. Abdominal:     General: Abdomen is flat. Bowel sounds are normal. There is no distension.     Tenderness: There is no abdominal tenderness. There is no guarding or rebound.  Musculoskeletal:        General: Normal range of motion.     Cervical back: Neck supple.  Skin:    General: Skin is warm and dry.     Findings: No rash.  Neurological:     General: No focal deficit present.     Mental Status: She is alert. She is confused.  Psychiatric:        Mood and Affect: Mood normal.        Behavior: Behavior normal.    ED Results / Procedures / Treatments   Labs (all labs ordered are listed, but only abnormal results are displayed) Labs Reviewed  COMPREHENSIVE METABOLIC PANEL - Abnormal; Notable for the following components:      Result Value   Glucose, Bld 105 (*)    BUN 24 (*)    Creatinine, Ser 1.01 (*)    Total Protein 6.1 (*)    Albumin 2.9 (*)    AST 14 (*)    Total Bilirubin 0.1 (*)    GFR, Estimated 57 (*)    All other components within normal limits  I-STAT CHEM 8, ED - Abnormal; Notable for the following components:   BUN 28 (*)    All other components within normal limits  RESP PANEL BY RT-PCR (FLU A&B, COVID) ARPGX2  CBC WITH DIFFERENTIAL/PLATELET    EKG None  Radiology No results found.  Procedures Procedures   Medications Ordered in ED Medications  sodium chloride 0.9 % bolus 500 mL (0 mLs Intravenous  Stopped 10/04/21 1658)    ED Course  I have reviewed the triage vital signs and the nursing notes.  Pertinent labs & imaging results that were available during my care of the patient were reviewed by me and considered in my medical decision making (see chart for details).  Clinical Course as of 10/04/21 1704  Sat Oct 04, 2021  1555 I discussed this case with my attending physician who cosigned this note including patient's presenting symptoms, physical exam, and planned diagnostics and interventions. Attending physician stated agreement with plan or made changes to plan which were implemented.   Attending physician assessed patient at bedside.   [CF]  1652 Per nursing note, the daughter came into town after being away for some time and noticed that the patient had not eaten in 2 days and brought her to the emergency department. [CF]  1652 On reevaluation, patient's hypotension has improved. [CF]    Clinical Course User Index [CF] Jolyn Lent   MDM Rules/Calculators/A&P                          Ruth Davidson is a 80 y.o. female who presents the emergency department for decrease oral intake.  Was unable to get additional history from her facility.  We will get some basic labs and i-STAT Chem-8 to evaluate hydration status.  In the meantime I will give her a liter of fluid.  We will also swab her for COVID and flu.  I-STAT was normal apart from a slightly elevated BUN.  CBC without any significant abnormality.  CMP showed elevated glucose and slightly elevated BUN and creatinine.  Creatinine was improved from previous results.  COVID and flu  were negative.  Give the patient 500 mL of fluid.  Clinically, she is at her baseline.  Patient does not qualify for hospital admission at this time and is safe for outpatient follow-up.  I will discharge her back to [facility] with strict return precautions.   Final Clinical Impression(s) / ED Diagnoses Final diagnoses:  Loss of appetite     Rx / DC Orders ED Discharge Orders     None        Jolyn Lent 10/04/21 Nils Pyle, MD 10/04/21 [phone]

## 2021-10-04 NOTE — ED Notes (Signed)
Pt is confused and noncompliant with vital signs

## 2021-10-04 NOTE — Discharge Instructions (Signed)
Follow-up with your family doctor 

## 2021-10-04 NOTE — ED Notes (Signed)
Pt refused discharge vitals 

## 2021-10-04 NOTE — ED Triage Notes (Signed)
Pt brought in by [facility] from [facility] with c/o decreased appetite x 2 days.

## 2021-10-04 NOTE — ED Notes (Signed)
Pt pulled out IV

## 2021-11-18 ENCOUNTER — Emergency Department (HOSPITAL_COMMUNITY): Payer: Medicare (Managed Care)

## 2021-11-18 ENCOUNTER — Other Ambulatory Visit: Payer: Self-pay

## 2021-11-18 ENCOUNTER — Emergency Department (HOSPITAL_COMMUNITY)
Admission: EM | Admit: 2021-11-18 | Discharge: 2021-11-19 | Disposition: A | Discharge status: Home / Self Care | Payer: Medicare (Managed Care) | Attending: Emergency Medicine | Admitting: Emergency Medicine

## 2021-11-18 ENCOUNTER — Encounter (HOSPITAL_COMMUNITY): Payer: Self-pay

## 2021-11-18 DIAGNOSIS — W19XXXA Unspecified fall, initial encounter: Secondary | ICD-10-CM | POA: Insufficient documentation

## 2021-11-18 DIAGNOSIS — N3001 Acute cystitis with hematuria: Secondary | ICD-10-CM | POA: Insufficient documentation

## 2021-11-18 DIAGNOSIS — R220 Localized swelling, mass and lump, head: Secondary | ICD-10-CM | POA: Insufficient documentation

## 2021-11-18 DIAGNOSIS — R079 Chest pain, unspecified: Secondary | ICD-10-CM | POA: Diagnosis not present

## 2021-11-18 DIAGNOSIS — F0390 Unspecified dementia without behavioral disturbance: Secondary | ICD-10-CM | POA: Diagnosis not present

## 2021-11-18 LAB — CBC WITH DIFFERENTIAL/PLATELET
Abs Immature Granulocytes: 0.1 10*3/uL — ABNORMAL HIGH (ref 0.00–0.07)
Basophils Absolute: 0 10*3/uL (ref 0.0–0.1)
Basophils Relative: 0 %
Eosinophils Absolute: 0 10*3/uL (ref 0.0–0.5)
Eosinophils Relative: 0 %
HCT: 48 % — ABNORMAL HIGH (ref 36.0–46.0)
Hemoglobin: 14.9 g/dL (ref 12.0–15.0)
Immature Granulocytes: 1 %
Lymphocytes Relative: 8 %
Lymphs Abs: 1 10*3/uL (ref 0.7–4.0)
MCH: 30 pg (ref 26.0–34.0)
MCHC: 31 g/dL (ref 30.0–36.0)
MCV: 96.6 fL (ref 80.0–100.0)
Monocytes Absolute: 0.8 10*3/uL (ref 0.1–1.0)
Monocytes Relative: 7 %
Neutro Abs: 10.5 10*3/uL — ABNORMAL HIGH (ref 1.7–7.7)
Neutrophils Relative %: 84 %
Platelets: 278 10*3/uL (ref 150–400)
RBC: 4.97 MIL/uL (ref 3.87–5.11)
RDW: 13.2 % (ref 11.5–15.5)
WBC: 12.5 10*3/uL — ABNORMAL HIGH (ref 4.0–10.5)
nRBC: 0 % (ref 0.0–0.2)

## 2021-11-18 LAB — COMPREHENSIVE METABOLIC PANEL
ALT: 21 U/L (ref 0–44)
AST: 21 U/L (ref 15–41)
Albumin: 3.4 g/dL — ABNORMAL LOW (ref 3.5–5.0)
Alkaline Phosphatase: 109 U/L (ref 38–126)
Anion gap: 16 — ABNORMAL HIGH (ref 5–15)
BUN: 12 mg/dL (ref 8–23)
CO2: 25 mmol/L (ref 22–32)
Calcium: 9.3 mg/dL (ref 8.9–10.3)
Chloride: 100 mmol/L (ref 98–111)
Creatinine, Ser: 0.77 mg/dL (ref 0.44–1.00)
GFR, Estimated: >60 mL/min (ref >60)
Glucose, Bld: 114 mg/dL — ABNORMAL HIGH (ref 70–99)
Potassium: 3.8 mmol/L (ref 3.5–5.1)
Sodium: 141 mmol/L (ref 135–145)
Total Bilirubin: 0.9 mg/dL (ref 0.3–1.2)
Total Protein: 6.8 g/dL (ref 6.5–8.1)

## 2021-11-18 NOTE — ED Notes (Signed)
Patient will not leave monitoring equipment in place. Attempted to replace multiple times. Multiple staff members required to hold patient for lab draw. Patient fidgeting and moving all about in bed.

## 2021-11-18 NOTE — ED Triage Notes (Signed)
Patient non-ambulatory with Dementia from [facility] with reported unwitnessed fall PTA. Staff report that patient was found on floor in room five feet from bed. Patient comfort care at facility with orders not to send to ER. Noted with hematoma to right forehead. Not on a blood thinner. Patient is poor historian.

## 2021-11-18 NOTE — ED Notes (Signed)
Bladder scan revealed 0ml. °

## 2021-11-18 NOTE — ED Provider Notes (Signed)
[facility] EMERGENCY DEPARTMENT Provider Note   CSN: [id number] Arrival date & time: 11/18/21  1643     History  Chief Complaint  Patient presents with   Lytle Michaels    Ruth Davidson is a 81 y.o. female.   Fall Patient with history of dementia presents via EMS from skilled nursing facility after an unwitnessed fall.  Patient was found on the floor in her room, 5 feet from bed.  She is reportedly not on any blood thinning medications.    Home Medications Prior to Admission medications   Medication Sig Start Date End Date Taking? Authorizing Provider  Amino Acids-Protein Hydrolys (FEEDING SUPPLEMENT, PRO-STAT SUGAR FREE 64,) LIQD Take 30 mLs by mouth daily.   Yes [provider]  cephALEXin (KEFLEX) 500 MG capsule Take 1 capsule (500 mg total) by mouth 4 (four) times daily. 11/19/21  Yes Milton Ferguson, MD  feeding supplement, ENSURE ENLIVE, (ENSURE ENLIVE) LIQD Take 237 mLs by mouth 2 (two) times daily between meals. 05/29/19  Yes Johnson, Clanford L, MD  LORazepam (ATIVAN) 2 MG/ML injection Inject 0.25 mLs into the vein every 3 (three) hours as needed for anxiety. 10/30/21  Yes [provider]  Morphine Sulfate (MORPHINE CONCENTRATE) 10 mg / 0.5 ml concentrated solution Take by mouth See admin instructions. Give 0.25 ml by mouth every 3 hours as needed for pain/sob 10/30/21  Yes [provider]  bisacodyl (DULCOLAX) 10 MG suppository Place 1 suppository (10 mg total) rectally daily as needed for moderate constipation. Patient not taking: Reported on 11/18/2021 11/21/19   Tomma Rakers, MD  carvedilol (COREG) 3.125 MG tablet Take 1 tablet (3.125 mg total) by mouth 2 (two) times daily with a meal. Patient not taking: Reported on 11/18/2021 11/21/19 12/21/19  Tomma Rakers, MD  docusate sodium (COLACE) 100 MG capsule Take 1 capsule (100 mg total) by mouth 2 (two) times daily. Patient not taking: Reported on 11/30/2019 11/21/19   Tomma Rakers, MD  Multiple Vitamin (MULTIVITAMIN WITH MINERALS) TABS tablet Take 1 tablet by mouth daily. Patient not taking: Reported on 11/18/2021 05/30/19   Murlean Iba, MD  nitroGLYCERIN (NITROSTAT) 0.4 MG SL tablet Place 1 tablet (0.4 mg total) under the tongue every 5 (five) minutes as needed for chest pain (or back pain or arm pain). Patient not taking: Reported on [id number]   Delora Fuel, MD  potassium chloride (K-DUR) 10 MEQ tablet Take 1 tablet (10 mEq total) by mouth every other day. Patient not taking: Reported on 11/18/2021 05/31/19   Murlean Iba, MD      Allergies    Patient has no known allergies.    Review of Systems   Review of Systems  Unable to perform ROS: Dementia   Physical Exam Updated Vital Signs BP 106/64    Pulse 70    Temp (!) 97.3 F (36.3 C) (Tympanic)    Resp 18    Ht 5\' 4"  (1.626 m)    Wt 34 kg    SpO2 95%    BMI 12.87 kg/m  Physical Exam Constitutional:      General: She is awake. She is not in acute distress.    Appearance: She is underweight. She is not ill-appearing, toxic-appearing or diaphoretic.  HENT:     Head: Normocephalic.     Comments: R frontal scalp swelling    Right Ear: External ear normal.     Left Ear: External ear normal.     Nose: Nose normal.  Mouth/Throat:     Mouth: Mucous membranes are moist.  Eyes:     Extraocular Movements: Extraocular movements intact.     Conjunctiva/sclera: Conjunctivae normal.  Cardiovascular:     Rate and Rhythm: Normal rate and regular rhythm.  Pulmonary:     Effort: Pulmonary effort is normal. No respiratory distress.     Breath sounds: No wheezing.  Chest:     Chest wall: No tenderness.  Abdominal:     Palpations: Abdomen is soft.     Tenderness: There is no abdominal tenderness.  Musculoskeletal:        General: No swelling, tenderness or deformity.  Skin:    General: Skin is warm and dry.  Neurological:     Mental Status: Mental status is at baseline.   Psychiatric:        Behavior: Behavior is uncooperative.    ED Results / Procedures / Treatments   Labs (all labs ordered are listed, but only abnormal results are displayed) Labs Reviewed  CBC WITH DIFFERENTIAL/PLATELET - Abnormal; Notable for the following components:      Result Value   WBC 12.5 (*)    HCT 48.0 (*)    Neutro Abs 10.5 (*)    Abs Immature Granulocytes 0.10 (*)    All other components within normal limits  COMPREHENSIVE METABOLIC PANEL - Abnormal; Notable for the following components:   Glucose, Bld 114 (*)    Albumin 3.4 (*)    Anion gap 16 (*)    All other components within normal limits  URINALYSIS, ROUTINE W REFLEX MICROSCOPIC - Abnormal; Notable for the following components:   Specific Gravity, Urine >1.030 (*)    Hgb urine dipstick MODERATE (*)    Bilirubin Urine SMALL (*)    Ketones, ur 15 (*)    Protein, ur 30 (*)    All other components within normal limits  URINALYSIS, MICROSCOPIC (REFLEX) - Abnormal; Notable for the following components:   Bacteria, UA MANY (*)    All other components within normal limits    EKG None  Radiology DG Chest 1 View  Result Date: 11/18/2021 CLINICAL DATA:  Found on floor. EXAM: CHEST  1 VIEW COMPARISON:  Chest x-ray 05/08/2021. FINDINGS: Surgical clips overlie the left lower neck. The heart size and mediastinal contours are within normal limits. Both lungs are clear. The visualized skeletal structures are unremarkable. IMPRESSION: No active disease. Electronically Signed   By: Ronney Asters M.D.   On: 11/18/2021 18:25   DG Pelvis 1-2 Views  Result Date: 11/18/2021 CLINICAL DATA:  Found on floor EXAM: PELVIS - 1-2 VIEW COMPARISON:  12/01/2019 FINDINGS: Negative for acute fracture. Bilateral hip hemiarthroplasty in satisfactory position and alignment. No hardware complication. IMPRESSION: No acute abnormality. Electronically Signed   By: Franchot Gallo M.D.   On: 11/18/2021 18:29   CT Head Wo Contrast  Result Date:  11/18/2021 CLINICAL DATA:  Head trauma.  Fall. EXAM: CT HEAD WITHOUT CONTRAST CT CERVICAL SPINE WITHOUT CONTRAST TECHNIQUE: Multidetector CT imaging of the head and cervical spine was performed following the standard protocol without intravenous contrast. Multiplanar CT image reconstructions of the cervical spine were also generated. RADIATION DOSE REDUCTION: This exam was performed according to the departmental dose-optimization program which includes automated exposure control, adjustment of the mA and/or kV according to patient size and/or use of iterative reconstruction technique. COMPARISON:  Head CT 06/22/2017 and MRI 06/21/2017 FINDINGS: CT HEAD FINDINGS The study is moderately motion degraded. Brain: There is no evidence of an  acute infarct, intracranial hemorrhage, mass, midline shift, or extra-axial fluid collection. There is progressive, moderate cerebral atrophy with some asymmetric ex vacuo enlargement of the left lateral ventricle. 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 Vascular: Calcified atherosclerosis at the skull base. No hyperdense vessel. Skull: No acute fracture is identified within limitations of motion artifact. Sinuses/Orbits: Visualized paranasal sinuses and mastoid air cells are clear. Unremarkable orbits. Other: Small right frontal scalp hematoma. CT CERVICAL SPINE FINDINGS Alignment: Trace anterolisthesis of C4 on C5. Skull base and vertebrae: Motion artifact limits assessment for nondisplaced or mildly displaced fractures, particularly through the upper to mid cervical spine. No gross acute cervical spine fracture is identified. Mild T1 superior endplate compression fracture, chronic in appearance. Soft tissues and spinal canal: No prevertebral fluid or swelling. No visible canal hematoma. Disc levels: Mild for age cervical disc degeneration, greatest at C5-6. Upper chest: Biapical pleuroparenchymal lung scarring. Other: Calcific atherosclerosis at the  carotid bifurcations. IMPRESSION: 1. No evidence of acute intracranial abnormality. 2. Small right frontal scalp hematoma. 3. Motion degraded cervical spine CT significantly limiting assessment for fracture. No gross acute fracture identified. Electronically Signed   By: Logan Bores M.D.   On: 11/18/2021 18:26   CT Cervical Spine Wo Contrast  Result Date: 11/18/2021 CLINICAL DATA:  Head trauma.  Fall. EXAM: CT HEAD WITHOUT CONTRAST CT CERVICAL SPINE WITHOUT CONTRAST TECHNIQUE: Multidetector CT imaging of the head and cervical spine was performed following the standard protocol without intravenous contrast. Multiplanar CT image reconstructions of the cervical spine were also generated. RADIATION DOSE REDUCTION: This exam was performed according to the departmental dose-optimization program which includes automated exposure control, adjustment of the mA and/or kV according to patient size and/or use of iterative reconstruction technique. COMPARISON:  Head CT 06/22/2017 and MRI 06/21/2017 FINDINGS: CT HEAD FINDINGS The study is moderately motion degraded. Brain: There is no evidence of an acute infarct, intracranial hemorrhage, mass, midline shift, or extra-axial fluid collection. There is progressive, moderate cerebral atrophy with some asymmetric ex vacuo enlargement of the left lateral ventricle. 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 Vascular: Calcified atherosclerosis at the skull base. No hyperdense vessel. Skull: No acute fracture is identified within limitations of motion artifact. Sinuses/Orbits: Visualized paranasal sinuses and mastoid air cells are clear. Unremarkable orbits. Other: Small right frontal scalp hematoma. CT CERVICAL SPINE FINDINGS Alignment: Trace anterolisthesis of C4 on C5. Skull base and vertebrae: Motion artifact limits assessment for nondisplaced or mildly displaced fractures, particularly through the upper to mid cervical spine. No gross acute cervical  spine fracture is identified. Mild T1 superior endplate compression fracture, chronic in appearance. Soft tissues and spinal canal: No prevertebral fluid or swelling. No visible canal hematoma. Disc levels: Mild for age cervical disc degeneration, greatest at C5-6. Upper chest: Biapical pleuroparenchymal lung scarring. Other: Calcific atherosclerosis at the carotid bifurcations. IMPRESSION: 1. No evidence of acute intracranial abnormality. 2. Small right frontal scalp hematoma. 3. Motion degraded cervical spine CT significantly limiting assessment for fracture. No gross acute fracture identified. Electronically Signed   By: Logan Bores M.D.   On: 11/18/2021 18:26    Procedures Procedures    Medications Ordered in ED Medications - No data to display  ED Course/ Medical Decision Making/ A&P                           Medical Decision Making Amount and/or Complexity  of Data Reviewed Labs: ordered. Radiology: ordered.  Risk Prescription drug management.   This patient presents to the ED for concern of fall, this involves an extensive number of treatment options, and is a complaint that carries with it a high risk of complications and morbidity.  The differential diagnosis includes acute injuries   Co morbidities that complicate the patient evaluation  Advanced dementia   Additional history obtained:  Additional history obtained from EMS, patient's daughter External records from outside source obtained and reviewed including EMR   Lab Tests:  I Ordered, and personally interpreted labs.  The pertinent results include: Lab leukocytosis, normal hemoglobin, normal electrolytes   Imaging Studies ordered:  I ordered imaging studies including x-rays of chest and pelvis, CT scan of the head and cervical spine I independently visualized and interpreted imaging which showed small scalp hematoma but otherwise no acute injuries I agree with the radiologist interpretation   Cardiac  Monitoring:  The patient was maintained on a cardiac monitor.  I personally viewed and interpreted the cardiac monitored which showed an underlying rhythm of: Sinus rhythm   Medicines ordered and prescription drug management:  Reevaluation of the patient after these medicines showed that the patient stayed the same I have reviewed the patients home medicines and have made adjustments as needed   Problem List / ED Course:  Patient is a 81 year old female with advanced dementia, presenting for nursing facility for an unwitnessed fall.  On exam, patient resting in left lateral decubitus position.  On examining for areas of tenderness, patient request to be left alone.  She is not in any acute distress at this time.  He was unable to identify any suspicion for extremity injury.  Patient also has known chest abdominal tenderness.  Basic labs and imaging studies were ordered.  Results were unremarkable.  I spoke with the patient's daughter to inform her of work-up results and need for discharge.  Patient started request urinalysis prior to discharge.  This was ordered.  Obtaining urine sample was difficult due to patient's dementia..  Patient was signed out to oncoming ED provider.  Plan will be for discharge following urinalysis.   Reevaluation:  After the interventions noted above, I reevaluated the patient and found that they have :stayed the same   Social Determinants of Health:  Patient has advanced dementia which negatively impacts her social determinants of health.   Dispostion:  After consideration of the diagnostic results and the patients response to treatment, I feel that the patent would benefit from discharge following urinalysis..          Final Clinical Impression(s) / ED Diagnoses Final diagnoses:  Acute cystitis with hematuria  Fall, initial encounter    Rx / DC Orders ED Discharge Orders          Ordered    cephALEXin (KEFLEX) 500 MG capsule  4 times daily         11/19/21 0909              Godfrey Pick, MD 11/20/21 1502

## 2021-11-18 NOTE — ED Notes (Signed)
Unable to complete urinalysis. No urine in bladder to collect.  Pt uncooperative and does not want to be messed with.

## 2021-11-18 NOTE — ED Notes (Signed)
Patient to CT at this time

## 2021-11-19 DIAGNOSIS — R220 Localized swelling, mass and lump, head: Secondary | ICD-10-CM | POA: Diagnosis not present

## 2021-11-19 LAB — URINALYSIS, ROUTINE W REFLEX MICROSCOPIC
Glucose, UA: NEGATIVE mg/dL
Ketones, ur: 15 mg/dL — AB
Leukocytes,Ua: NEGATIVE
Nitrite: NEGATIVE
Protein, ur: 30 mg/dL — AB
Specific Gravity, Urine: >1.03 — ABNORMAL HIGH (ref 1.005–1.030)
pH: 5.5 (ref 5.0–8.0)

## 2021-11-19 LAB — URINALYSIS, MICROSCOPIC (REFLEX)

## 2021-11-19 MED ORDER — CEPHALEXIN 500 MG PO CAPS: 500.0000 mg | ORAL_CAPSULE | Freq: Four times a day (QID) | ORAL | 0 refills | Status: AC

## 2021-11-19 NOTE — Discharge Instructions (Signed)
Drink plenty of fluids.  Follow-up with your family doctor next week °

## 2021-11-19 NOTE — ED Notes (Signed)
Unable to get UA, pt only shows 77mL on bladder scan. EDP made aware

## 2021-11-19 NOTE — ED Notes (Signed)
Telephone report given to [facility]

## 2021-12-31 DEATH — deceased
